# Patient Record
Sex: Female | Born: 1935
Health system: Southern US, Community
[De-identification: ages and names within clinical notes are randomized; demographics above are authoritative.]

## PROBLEM LIST (undated history)

## (undated) DIAGNOSIS — C50919 Malignant neoplasm of unspecified site of unspecified female breast: Secondary | ICD-10-CM

## (undated) DIAGNOSIS — I609 Nontraumatic subarachnoid hemorrhage, unspecified: Secondary | ICD-10-CM

## (undated) DIAGNOSIS — E785 Hyperlipidemia, unspecified: Secondary | ICD-10-CM

## (undated) DIAGNOSIS — R945 Abnormal results of liver function studies: Secondary | ICD-10-CM

## (undated) DIAGNOSIS — S52123A Displaced fracture of head of unspecified radius, initial encounter for closed fracture: Secondary | ICD-10-CM

## (undated) DIAGNOSIS — Z803 Family history of malignant neoplasm of breast: Secondary | ICD-10-CM

## (undated) DIAGNOSIS — K635 Polyp of colon: Secondary | ICD-10-CM

## (undated) DIAGNOSIS — H3530 Unspecified macular degeneration: Secondary | ICD-10-CM

## (undated) DIAGNOSIS — I10 Essential (primary) hypertension: Secondary | ICD-10-CM

## (undated) DIAGNOSIS — K589 Irritable bowel syndrome without diarrhea: Secondary | ICD-10-CM

## (undated) DIAGNOSIS — C44712 Basal cell carcinoma of skin of right lower limb, including hip: Secondary | ICD-10-CM

## (undated) DIAGNOSIS — K5790 Diverticulosis of intestine, part unspecified, without perforation or abscess without bleeding: Secondary | ICD-10-CM

## (undated) DIAGNOSIS — S065X9A Traumatic subdural hemorrhage with loss of consciousness of unspecified duration, initial encounter: Secondary | ICD-10-CM

## (undated) DIAGNOSIS — E0590 Thyrotoxicosis, unspecified without thyrotoxic crisis or storm: Secondary | ICD-10-CM

## (undated) DIAGNOSIS — T7840XA Allergy, unspecified, initial encounter: Secondary | ICD-10-CM

## (undated) DIAGNOSIS — C4491 Basal cell carcinoma of skin, unspecified: Secondary | ICD-10-CM

## (undated) DIAGNOSIS — Z1379 Encounter for other screening for genetic and chromosomal anomalies: Secondary | ICD-10-CM

## (undated) HISTORY — PX: ABDOMINAL HYSTERECTOMY: SHX81

## (undated) HISTORY — DX: Diverticulosis of intestine, part unspecified, without perforation or abscess without bleeding: K57.90

## (undated) HISTORY — DX: Irritable bowel syndrome without diarrhea: K58.9

## (undated) HISTORY — DX: Essential (primary) hypertension: I10

## (undated) HISTORY — DX: Encounter for other screening for genetic and chromosomal anomalies: Z13.79

## (undated) HISTORY — DX: Basal cell carcinoma of skin, unspecified: C44.91

## (undated) HISTORY — DX: Unspecified macular degeneration: H35.30

## (undated) HISTORY — PX: TUBAL LIGATION: SHX77

## (undated) HISTORY — DX: Family history of malignant neoplasm of breast: Z80.3

## (undated) HISTORY — PX: CATARACT EXTRACTION, BILATERAL: SHX1313

## (undated) HISTORY — DX: Allergy, unspecified, initial encounter: T78.40XA

## (undated) HISTORY — DX: Gilbert syndrome: E80.4

## (undated) HISTORY — DX: Traumatic subdural hemorrhage with loss of consciousness of unspecified duration, initial encounter: S06.5X9A

## (undated) HISTORY — DX: Nontraumatic subarachnoid hemorrhage, unspecified: I60.9

## (undated) HISTORY — DX: Displaced fracture of head of unspecified radius, initial encounter for closed fracture: S52.123A

## (undated) HISTORY — DX: Polyp of colon: K63.5

## (undated) HISTORY — DX: Abnormal results of liver function studies: R94.5

## (undated) HISTORY — DX: Hyperlipidemia, unspecified: E78.5

## (undated) HISTORY — DX: Basal cell carcinoma of skin of right lower limb, including hip: C44.712

## (undated) HISTORY — DX: Malignant neoplasm of unspecified site of unspecified female breast: C50.919

## (undated) HISTORY — DX: Thyrotoxicosis, unspecified without thyrotoxic crisis or storm: E05.90

---

## 1982-03-26 DIAGNOSIS — K589 Irritable bowel syndrome without diarrhea: Secondary | ICD-10-CM

## 1982-03-26 HISTORY — DX: Irritable bowel syndrome, unspecified: K58.9

## 1987-03-27 DIAGNOSIS — C50919 Malignant neoplasm of unspecified site of unspecified female breast: Secondary | ICD-10-CM

## 1987-03-27 HISTORY — DX: Malignant neoplasm of unspecified site of unspecified female breast: C50.919

## 1987-03-27 HISTORY — PX: BREAST LUMPECTOMY: SHX2

## 1998-03-26 HISTORY — PX: TOTAL KNEE ARTHROPLASTY: SHX125

## 1999-09-22 ENCOUNTER — Ambulatory Visit (HOSPITAL_COMMUNITY): Admission: RE | Admit: 1999-09-22 | Discharge: 1999-09-22 | Payer: Self-pay | Admitting: Internal Medicine

## 1999-09-22 ENCOUNTER — Encounter: Payer: Self-pay | Admitting: Internal Medicine

## 1999-10-03 ENCOUNTER — Other Ambulatory Visit: Admission: RE | Admit: 1999-10-03 | Discharge: 1999-10-03 | Payer: Self-pay | Admitting: Obstetrics and Gynecology

## 1999-10-11 ENCOUNTER — Encounter: Admission: RE | Admit: 1999-10-11 | Discharge: 2000-01-09 | Payer: Self-pay | Admitting: Radiation Oncology

## 2000-09-23 ENCOUNTER — Encounter: Payer: Self-pay | Admitting: Internal Medicine

## 2000-09-23 ENCOUNTER — Ambulatory Visit (HOSPITAL_COMMUNITY): Admission: RE | Admit: 2000-09-23 | Discharge: 2000-09-23 | Payer: Self-pay | Admitting: Internal Medicine

## 2000-09-23 DIAGNOSIS — K635 Polyp of colon: Secondary | ICD-10-CM

## 2000-09-23 HISTORY — DX: Polyp of colon: K63.5

## 2000-10-09 ENCOUNTER — Ambulatory Visit (HOSPITAL_COMMUNITY): Admission: RE | Admit: 2000-10-09 | Discharge: 2000-10-09 | Payer: Self-pay | Admitting: Gastroenterology

## 2000-10-09 ENCOUNTER — Encounter (INDEPENDENT_AMBULATORY_CARE_PROVIDER_SITE_OTHER): Payer: Self-pay | Admitting: Specialist

## 2000-11-14 ENCOUNTER — Ambulatory Visit (HOSPITAL_COMMUNITY): Admission: RE | Admit: 2000-11-14 | Discharge: 2000-11-14 | Payer: Self-pay | Admitting: Obstetrics and Gynecology

## 2000-11-14 ENCOUNTER — Encounter: Payer: Self-pay | Admitting: Obstetrics and Gynecology

## 2001-09-24 ENCOUNTER — Encounter: Payer: Self-pay | Admitting: Obstetrics and Gynecology

## 2001-09-24 ENCOUNTER — Ambulatory Visit (HOSPITAL_COMMUNITY): Admission: RE | Admit: 2001-09-24 | Discharge: 2001-09-24 | Payer: Self-pay | Admitting: Obstetrics and Gynecology

## 2001-10-07 ENCOUNTER — Encounter: Admission: RE | Admit: 2001-10-07 | Discharge: 2001-10-07 | Payer: Self-pay | Admitting: Internal Medicine

## 2001-10-07 ENCOUNTER — Encounter: Payer: Self-pay | Admitting: Internal Medicine

## 2001-11-05 ENCOUNTER — Other Ambulatory Visit: Admission: RE | Admit: 2001-11-05 | Discharge: 2001-11-05 | Payer: Self-pay | Admitting: Obstetrics and Gynecology

## 2001-11-07 ENCOUNTER — Ambulatory Visit (HOSPITAL_COMMUNITY): Admission: RE | Admit: 2001-11-07 | Discharge: 2001-11-07 | Payer: Self-pay | Admitting: Internal Medicine

## 2002-03-26 HISTORY — PX: TOTAL KNEE ARTHROPLASTY: SHX125

## 2002-05-18 ENCOUNTER — Inpatient Hospital Stay (HOSPITAL_COMMUNITY): Admission: RE | Admit: 2002-05-18 | Discharge: 2002-05-21 | Payer: Self-pay | Admitting: Orthopedic Surgery

## 2002-10-02 ENCOUNTER — Ambulatory Visit (HOSPITAL_COMMUNITY): Admission: RE | Admit: 2002-10-02 | Discharge: 2002-10-02 | Payer: Self-pay | Admitting: Obstetrics and Gynecology

## 2002-10-02 ENCOUNTER — Encounter: Payer: Self-pay | Admitting: Obstetrics and Gynecology

## 2003-10-11 ENCOUNTER — Ambulatory Visit (HOSPITAL_COMMUNITY): Admission: RE | Admit: 2003-10-11 | Discharge: 2003-10-11 | Payer: Self-pay | Admitting: Internal Medicine

## 2004-01-18 ENCOUNTER — Other Ambulatory Visit: Admission: RE | Admit: 2004-01-18 | Discharge: 2004-01-18 | Payer: Self-pay | Admitting: Obstetrics and Gynecology

## 2004-05-24 HISTORY — PX: HAMMER TOE SURGERY: SHX385

## 2004-10-11 ENCOUNTER — Ambulatory Visit (HOSPITAL_COMMUNITY): Admission: RE | Admit: 2004-10-11 | Discharge: 2004-10-11 | Payer: Self-pay | Admitting: Obstetrics and Gynecology

## 2005-10-12 ENCOUNTER — Ambulatory Visit (HOSPITAL_COMMUNITY): Admission: RE | Admit: 2005-10-12 | Discharge: 2005-10-12 | Payer: Self-pay | Admitting: Internal Medicine

## 2006-03-26 DIAGNOSIS — H3530 Unspecified macular degeneration: Secondary | ICD-10-CM

## 2006-03-26 HISTORY — DX: Unspecified macular degeneration: H35.30

## 2006-11-20 ENCOUNTER — Ambulatory Visit (HOSPITAL_COMMUNITY): Admission: RE | Admit: 2006-11-20 | Discharge: 2006-11-20 | Payer: Self-pay | Admitting: *Deleted

## 2007-01-25 HISTORY — PX: OTHER SURGICAL HISTORY: SHX169

## 2007-11-24 ENCOUNTER — Ambulatory Visit (HOSPITAL_COMMUNITY): Admission: RE | Admit: 2007-11-24 | Discharge: 2007-11-24 | Payer: Self-pay | Admitting: Obstetrics and Gynecology

## 2008-01-20 LAB — HM DEXA SCAN

## 2008-06-24 DIAGNOSIS — S065XAA Traumatic subdural hemorrhage with loss of consciousness status unknown, initial encounter: Secondary | ICD-10-CM

## 2008-06-24 DIAGNOSIS — S065X9A Traumatic subdural hemorrhage with loss of consciousness of unspecified duration, initial encounter: Secondary | ICD-10-CM

## 2008-06-24 HISTORY — DX: Traumatic subdural hemorrhage with loss of consciousness of unspecified duration, initial encounter: S06.5X9A

## 2008-06-24 HISTORY — DX: Traumatic subdural hemorrhage with loss of consciousness status unknown, initial encounter: S06.5XAA

## 2008-06-28 ENCOUNTER — Inpatient Hospital Stay (HOSPITAL_COMMUNITY): Admission: EM | Admit: 2008-06-28 | Discharge: 2008-06-30 | Payer: Self-pay | Admitting: Emergency Medicine

## 2008-07-05 ENCOUNTER — Encounter: Admission: RE | Admit: 2008-07-05 | Discharge: 2008-07-05 | Payer: Self-pay | Admitting: Neurosurgery

## 2008-07-12 ENCOUNTER — Encounter: Admission: RE | Admit: 2008-07-12 | Discharge: 2008-07-12 | Payer: Self-pay | Admitting: Neurosurgery

## 2008-07-29 ENCOUNTER — Encounter: Admission: RE | Admit: 2008-07-29 | Discharge: 2008-07-29 | Payer: Self-pay | Admitting: Neurosurgery

## 2008-08-26 ENCOUNTER — Encounter: Admission: RE | Admit: 2008-08-26 | Discharge: 2008-08-26 | Payer: Self-pay | Admitting: Neurosurgery

## 2008-11-24 ENCOUNTER — Ambulatory Visit (HOSPITAL_COMMUNITY): Admission: RE | Admit: 2008-11-24 | Discharge: 2008-11-24 | Payer: Self-pay | Admitting: Obstetrics and Gynecology

## 2008-11-30 ENCOUNTER — Encounter: Admission: RE | Admit: 2008-11-30 | Discharge: 2008-11-30 | Payer: Self-pay | Admitting: Neurosurgery

## 2009-06-24 DIAGNOSIS — R7989 Other specified abnormal findings of blood chemistry: Secondary | ICD-10-CM

## 2009-06-24 HISTORY — DX: Other specified abnormal findings of blood chemistry: R79.89

## 2009-07-14 ENCOUNTER — Encounter: Admission: RE | Admit: 2009-07-14 | Discharge: 2009-07-14 | Payer: Self-pay | Admitting: Family Medicine

## 2009-07-24 HISTORY — PX: LAPAROSCOPIC CHOLECYSTECTOMY: SUR755

## 2009-07-24 HISTORY — PX: ERCP: SHX60

## 2009-07-28 ENCOUNTER — Encounter: Admission: RE | Admit: 2009-07-28 | Discharge: 2009-07-28 | Payer: Self-pay | Admitting: General Surgery

## 2009-08-03 ENCOUNTER — Inpatient Hospital Stay (HOSPITAL_COMMUNITY): Admission: AD | Admit: 2009-08-03 | Discharge: 2009-08-04 | Payer: Self-pay | Admitting: Gastroenterology

## 2009-08-03 ENCOUNTER — Encounter (INDEPENDENT_AMBULATORY_CARE_PROVIDER_SITE_OTHER): Payer: Self-pay | Admitting: Gastroenterology

## 2009-11-25 ENCOUNTER — Ambulatory Visit (HOSPITAL_COMMUNITY): Admission: RE | Admit: 2009-11-25 | Discharge: 2009-11-25 | Payer: Self-pay | Admitting: Family Medicine

## 2010-02-21 LAB — HM PAP SMEAR: HM Pap smear: NORMAL

## 2010-04-11 ENCOUNTER — Ambulatory Visit: Payer: Self-pay | Admitting: Genetic Counselor

## 2010-04-16 ENCOUNTER — Encounter: Payer: Self-pay | Admitting: Internal Medicine

## 2010-04-16 ENCOUNTER — Encounter: Payer: Self-pay | Admitting: Obstetrics and Gynecology

## 2010-06-13 LAB — COMPREHENSIVE METABOLIC PANEL
ALT: 119 U/L — ABNORMAL HIGH (ref 0–35)
AST: 152 U/L — ABNORMAL HIGH (ref 0–37)
AST: 89 U/L — ABNORMAL HIGH (ref 0–37)
Albumin: 2.8 g/dL — ABNORMAL LOW (ref 3.5–5.2)
Albumin: 3.1 g/dL — ABNORMAL LOW (ref 3.5–5.2)
Albumin: 3.7 g/dL (ref 3.5–5.2)
Alkaline Phosphatase: 255 U/L — ABNORMAL HIGH (ref 39–117)
BUN: 8 mg/dL (ref 6–23)
CO2: 28 mEq/L (ref 19–32)
CO2: 29 mEq/L (ref 19–32)
Calcium: 8.6 mg/dL (ref 8.4–10.5)
Calcium: 9.1 mg/dL (ref 8.4–10.5)
Calcium: 9.9 mg/dL (ref 8.4–10.5)
Creatinine, Ser: 0.78 mg/dL (ref 0.4–1.2)
GFR calc Af Amer: >60 mL/min (ref >60)
GFR calc non Af Amer: 60 mL/min — ABNORMAL LOW (ref >60)
GFR calc non Af Amer: >60 mL/min (ref >60)
Potassium: 3.1 mEq/L — ABNORMAL LOW (ref 3.5–5.1)
Potassium: 3.9 mEq/L (ref 3.5–5.1)
Sodium: 141 mEq/L (ref 135–145)
Sodium: 143 mEq/L (ref 135–145)
Total Bilirubin: 2.1 mg/dL — ABNORMAL HIGH (ref 0.3–1.2)
Total Protein: 4.9 g/dL — ABNORMAL LOW (ref 6.0–8.3)
Total Protein: 6.4 g/dL (ref 6.0–8.3)

## 2010-06-13 LAB — CBC
HCT: 35.1 % — ABNORMAL LOW (ref 36.0–46.0)
HCT: 40.8 % (ref 36.0–46.0)
MCHC: 33.4 g/dL (ref 30.0–36.0)
MCHC: 33.4 g/dL (ref 30.0–36.0)
MCHC: 33.5 g/dL (ref 30.0–36.0)
MCV: 89 fL (ref 78.0–100.0)
Platelets: 135 10*3/uL — ABNORMAL LOW (ref 150–400)
Platelets: 148 10*3/uL — ABNORMAL LOW (ref 150–400)
RBC: 4.21 MIL/uL (ref 3.87–5.11)
RDW: 13.3 % (ref 11.5–15.5)
RDW: 13.6 % (ref 11.5–15.5)
WBC: 5.1 10*3/uL (ref 4.0–10.5)
WBC: 5.6 10*3/uL (ref 4.0–10.5)

## 2010-06-13 LAB — TYPE AND SCREEN

## 2010-06-13 LAB — DIFFERENTIAL
Basophils Absolute: 0 10*3/uL (ref 0.0–0.1)
Basophils Relative: 0 % (ref 0–1)
Eosinophils Absolute: 0.1 10*3/uL (ref 0.0–0.7)
Monocytes Relative: 17 % — ABNORMAL HIGH (ref 3–12)
Neutrophils Relative %: 65 % (ref 43–77)

## 2010-07-05 LAB — COMPREHENSIVE METABOLIC PANEL
ALT: 21 U/L (ref 0–35)
Albumin: 4.2 g/dL (ref 3.5–5.2)
Alkaline Phosphatase: 79 U/L (ref 39–117)
BUN: 17 mg/dL (ref 6–23)
Chloride: 103 mEq/L (ref 96–112)
Glucose, Bld: 111 mg/dL — ABNORMAL HIGH (ref 70–99)
Potassium: 3 mEq/L — ABNORMAL LOW (ref 3.5–5.1)
Sodium: 142 mEq/L (ref 135–145)
Total Bilirubin: 1.8 mg/dL — ABNORMAL HIGH (ref 0.3–1.2)
Total Protein: 6.3 g/dL (ref 6.0–8.3)

## 2010-07-05 LAB — POCT I-STAT, CHEM 8
BUN: 19 mg/dL (ref 6–23)
Creatinine, Ser: 1 mg/dL (ref 0.4–1.2)
Glucose, Bld: 105 mg/dL — ABNORMAL HIGH (ref 70–99)
Hemoglobin: 15.3 g/dL — ABNORMAL HIGH (ref 12.0–15.0)
Potassium: 3 mEq/L — ABNORMAL LOW (ref 3.5–5.1)
Sodium: 142 mEq/L (ref 135–145)

## 2010-07-05 LAB — URINE MICROSCOPIC-ADD ON

## 2010-07-05 LAB — URINALYSIS, ROUTINE W REFLEX MICROSCOPIC
Glucose, UA: NEGATIVE mg/dL
Hgb urine dipstick: NEGATIVE
Specific Gravity, Urine: 1.01 (ref 1.005–1.030)

## 2010-07-05 LAB — URINE CULTURE

## 2010-07-05 LAB — PROTIME-INR: INR: 1 (ref 0.00–1.49)

## 2010-08-07 ENCOUNTER — Other Ambulatory Visit: Payer: Self-pay | Admitting: Family Medicine

## 2010-08-07 NOTE — Telephone Encounter (Signed)
Last seen by you at [facility], 11/25/09

## 2010-08-08 NOTE — H&P (Signed)
NAMEMarland Kitchen  Courtney Castro, Courtney Castro             ACCOUNT NO.:  [id number]   MEDICAL RECORD NO.:  [id number]          PATIENT TYPE:  INP   LOCATION:  3039                         FACILITY:  [facility]   PHYSICIAN:  Donalee Citrin, M.D.        DATE OF BIRTH:  [birth year]   DATE OF ADMISSION:  06/28/2008  DATE OF DISCHARGE:                              HISTORY & PHYSICAL   ADMITTING DIAGNOSIS:  Subacute chronic subdural hematoma.   HISTORY OF PRESENT ILLNESS:  The patient is a 77 year old female who had  a fall on the ice pack in the end of February 2010 landing on the left  side of her face, left eye, left orbit, and mouth sustaining a  laceration and saw her regular medical doctor, who was referred her to  the Urgent Care, and lacerations were sewn up.  He was doing fairly  well.  However, he has had approximately 3 episodes of slurring of her  speech, difficulty with word finding as well as some blurriness of  vision that will only last a couple of minutes at a time, most recent  event like this was this morning.  In between times, the patient is  awake and alert and completely oriented with no neurological problems.  The patient has been followed by nap and he is scheduled MRI scan on  Thursday.  However, the patient, with this most recent episode, was  brought to the ER and in the ER obtained a CT scan that revealed the  aforementioned hemorrhage.   PAST MEDICAL HISTORY:  Remarkable only for hypertension,  hypercholesterolemia, and hypothyroidism.   PAST SURGICAL HISTORY:  She has had tubal ligation, bilateral knee  replacements, and hysterectomy.   SOCIAL HISTORY:  She is a nonsmoker.   ALLERGIES:  She has an allergy to some pain medication, unspecified.   CURRENT MEDICATIONS:  1. Hyzaar 100/25 once a day.  2. Atenolol 25 once a day.  3. Simvastatin 40 once a day.  4. Synthroid 0.025 once a day and there is other p.r.n. including      aspirin every other day.   PHYSICAL EXAMINATION:  GENERAL:   On exam, the patient is a very pleasant  awake and alert 77 year old female in no acute distress.  HEENT is within normal limits.  Pupils are equal, round, and reactive to  light.  Extraocular movements are intact.  NEUROLOGIC:  Cranial nerves are intact.  Strength is 5/5 in the upper  and lower extremities.  She has no evidence of  pronator drift.  She has  normal sensation, normal reflexes.   CT scan does show a subacute chronic subdural hematoma bilaterally  worsen on the left with 4 mm of shift.  This appears to be mostly the  actual subdural size which represents about 4-5 mm with layering over  the convexity extensively discussed the results of the CT scan with the  patient and her husband and what I have recommended is to bring the  patient in the hospital, keep her off her aspirin, follow serial CTs,  and hopefully see some progressive breakdown of the acute  to subacute  component of this in order to facilitate potential burr-hole treatment  for this  subdural, and the patient is currently neurologically nonfocal and with  serial neurological exams and follow up CTs.  We will probably schedule  burr holes for Wednesday.  We will notify her primary care about her  admissions in the morning and we will admit the patient to observation  unit.           ______________________________  Donalee Citrin, M.D.     GC/MEDQ  D:  06/28/2008  T:  06/29/2008  Job:  [id number]

## 2010-08-11 NOTE — Procedures (Signed)
[facility]  Patient:    Courtney Castro, Courtney Castro                      MRN: [id number] Proc. Date: 10/09/00 Attending:  Verlin Grills, M.D. CC:         Darius Bump, M.D.   Procedure Report  PROCEDURE:  Colonoscopy and polypectomy.  REFERRING PHYSICIAN:  Darius Bump, M.D.  INDICATION FOR PROCEDURE:  Ms. Courtney Castro (DOB: 1933-04-13) is a 77 year old female who is referred for surveillance colonoscopy and polypectomy to prevent colon cancer. I discussed with [name] the complications associated with colonoscopy and polypectomy including a 15/1000 risk of bleeding and 06/998 risk of colonic perforation requiring surgical repair. [name] has signed the operative permit.  ENDOSCOPIST:  Verlin Grills, M.D.  PREMEDICATION:  Versed 7.5 mg, Demerol 50 mg.  ENDOSCOPE:  Olympus pediatric colonoscope.  DESCRIPTION OF PROCEDURE:  After obtaining informed consent, the patient was placed in the left lateral decubitus position. I administered intravenous Demerol and intravenous Versed to achieve conscious sedation for the procedure. The patients cardiac rhythm, oxygen saturation and blood pressure were monitored throughout the procedure and documented in the medical record.  Anal inspection was normal. Digital rectal exam was normal. The Olympus pediatric video colonoscope was introduced into the rectum and advanced to the cecum. Colonic preparation for the exam today was excellent.  RECTUM:  Normal.  SIGMOID COLON/DESCENDING COLON:  A few small colonic diverticula are present.  SPLENIC FLEXURE:  Normal.  TRANSVERSE COLON:  Normal.  HEPATIC FLEXURE:  Normal.  ASCENDING COLON:  From the proximal ascending colon, a 1 mm sessile polyp was removed with the cold biopsy forceps and submitted for pathological interpretation.  CECUM/ILEOCECAL VALVE:  Normal.  ASSESSMENT:  1. A few small diverticula are noted  predominantly in the sigmoid colon.  2. From the proximal ascending colon, a 1 mm sessile polyp was removed with     the cold biopsy forceps.  RECOMMENDATIONS:  If the ascending colon polyp returns neoplastic pathologically, [name] should undergo a repeat colonoscopy in five years. If the ascending colon polyp is nonneoplastic pathologically, [name] should undergo a repeat surveillance colonoscopy in approximately 10 years. DD:  10/09/00 TD:  10/09/00 Job: [id number]

## 2010-08-11 NOTE — Op Note (Signed)
NAME:  Courtney Castro, LOZADA                       ACCOUNT NO.:  [id number]   MEDICAL RECORD NO.:  [id number]                   PATIENT TYPE:  INP   LOCATION:  0008                                 [facility]AC[facility]  [facility]   PHYSICIAN:  Ollen Gross, M.D.                 DATE OF BIRTH:  08/25/1933   DATE OF PROCEDURE:  05/18/2002  DATE OF DISCHARGE:                                 OPERATIVE REPORT   PREOPERATIVE DIAGNOSIS:  Osteoarthritis of left knee.   POSTOPERATIVE DIAGNOSIS:  Osteoarthritis of left knee.   PROCEDURE:  Left total knee arthroplasty.   SURGEON:  Gus Rankin. Aluisio, M.D.   ASSISTANT:  Alexzandrew L. Julien Girt, P.A.   ANESTHESIA:  Spinal.   ESTIMATED BLOOD LOSS:  Minimal.   DRAIN:  Hemovac x 1.   COMPLICATIONS:  None.   TOURNIQUET TIME:  49 minutes at 300 mmHg.   CONDITION:  Stable to recovery.   BRIEF CLINICAL NOTE:  Ms. Courtney Castro is a 77 year old female with severe end-  stage arthritis of the left knee with pain refractory to nonoperative  management.  She has had a previous successful right total knee done in  [place] and presents now for left total knee arthroplasty.   PROCEDURE IN DETAIL:  After the successful administration of spinal  anesthetic, a tourniquet is placed high on the left thigh and left lower  extremity prepped and draped in the usual sterile fashion.  Extremity is  wrapped in Esmarch, knee flexed, tourniquet inflated to 300 mmHg.  Standard  midline incision is made with a 10 blade through subcutaneous tissue to the  level of the extensor mechanism.  A fresh blade is used to make a medial  parapatellar arthrotomy.  Then, the soft tissue over the proximal and medial  tibia is subperiosteally elevated to the joint line with a knife into  semimembranous bursa with a curved osteotome.  Soft tissue over the proximal  and lateral tibia is also elevated with attention being paid to avoiding the  patellar tendon on tibial tubercle.  The patella is then  everted, knee  flexed to 90 degrees and ACL and PCL are removed.  A drill is used to create  a starting hole in the distal femur, canal is irrigated, and a five-degree  left valgus alignment guide is placed.  Referencing off the posterior  condyles, rotation is marked and a block pinned to remove 10 mm off the  distal femur.  Distal femoral resection is then made with an oscillating  saw.   The sizing block is placed and size 3 is the most appropriate femur.  The  rotation is marked at the epicondylar axis and then the AP block placed.  The anterior and posterior cuts are then made.   Tibia is subluxed forward and the menisci removed.  The extramedullary  tibial alignment guide is placed referencing proximally at the medial aspect  of the tibial  tubercle and distally along the second metatarsal axis and  tibial crest.  The block is pinned to remove 10 mm off the nondeficient  lateral side.  Tibial resection is made with an oscillating saw.  The size 3  is the most appropriate tibial component, and then the proximal tibia is  prepared with the modular drill and keel punch.  The femoral preparation is  completed with the intercondylar and chamfer cuts.   A size 3 posterior stabilized femoral trial, size 3 mobile bearing tibial  trial, the 10 mm posterior stabilized rotating platform insert trial are  placed.  With the 10, there was a tiny bit of varus and valgus play in full  extension, thus we went with a 12.5, and she still achieved full extension  with excellent balance throughout her full range of motion.  The patella is  then everted, thickness measured to be 21 mm, free-hand resection taken to  12 mm.  The 38 template placed, lug holes drilled, trial patella placed, and  it tracks normally.  The osteophytes are then removed off the posterior  femur with the trial in place.  All trials are then removed, and the cut  bone surfaces are prepared with pulsatile lavage.  The cement  is  mixed and  once ready for implantation, a size 3 mobile bearing tibial tray, size 3  posterior stabilized femur, and 38 patella are cemented into place.  Trial  12.5 mm insert is placed, knee held in full extension, all extruded cement  removed.  The permanent 12.5 rotating platform posterior stabilized  component is then placed.  The knee is reduced with excellent stability  throughout full range of motion.  The wound is copiously irrigated with  antibiotic solution and the extensor mechanism closed over a Hemovac drain  with interrupted #1 PDS.  Tourniquet is released for a total time of 49  minutes.  Flexion against gravity is 135 degrees.  Subcu is closed with  interrupted 2-0 Vicryl, subcuticular closed with running 4-0 Monocryl.  The  incision is clean and dry and Steri-Strips and a bulky sterile dressing  applied.  The patient is awakened and transported to recovery in stable  condition.                                               Ollen Gross, M.D.    FA/MEDQ  D:  05/18/2002  T:  05/18/2002  Job:  [id number]

## 2010-08-11 NOTE — Discharge Summary (Signed)
NAME:  Courtney Castro, Courtney Castro                       ACCOUNT NO.:  [id number]   MEDICAL RECORD NO.:  [id number]                   PATIENT TYPE:  INP   LOCATION:  0453                                 [facility]ACILITY:  [facility]   PHYSICIAN:  Ollen Gross, M.D.                 DATE OF BIRTH:  1933/07/20   DATE OF ADMISSION:  05/18/2002  DATE OF DISCHARGE:  05/21/2002                                 DISCHARGE SUMMARY   ADMITTING DIAGNOSES:  1. Osteoarthritis, left knee.  2. Hypertension.  3. Hypercholesterolemia.  4. Hypothyroidism.  5. History of spastic colon.  6. History of breast cancer.   DISCHARGE DIAGNOSES:  1. Osteoarthritis, left knee, status post left total knee replacement     arthroplasty.  2. Mild postoperative blood loss anemia, did not require transfusion.  3. Hypokalemia, improved.  4. Postoperative hyponatremia, improved.  5. Hypertension.  6. Hypercholesterolemia.  7. Hypothyroidism.  8. History of spastic colon.  9. History of breast cancer.   PROCEDURE:  The patient was taken to the OR on 05/18/02, underwent a left  total knee arthroplasty.  Surgeon:  Ollen Gross, MD.  Assistant was  Alexzandrew L. Perkins, P.A.-C.  Surgery was done under spinal anesthesia.  Minimal blood loss.  Hemovac drain x 1.  Tourniquet time 49 minutes at 300  mmHg.   CONSULTS:  None.   BRIEF HISTORY:  The patient is a 77 year old female seen by Ollen Gross,  M.D. for ongoing left knee pain.  Has a known history of arthritis.  She  previously had a right total knee arthroplasty back in [place],  approximately four years ago and has had fantastic results.  She is seen in  the office for left knee pain.  X-rays show severe medial compartment  patellofemoral arthritis with sparing of the lateral compartment.  X-rays  show a Sigma total knee in excellent position on the right.  She has reached  a point where she would like to have something done about the left knee  pain.  It is starting to  interfere with her daily activities.  It is felt  she would benefit from undergoing a summary replacement.  Risks and benefits  of the procedure have been discussed with the patient, and she has elected  to proceed with surgery.   LABORATORY DATA:  CBC on admission showed a hemoglobin of 14.7, hematocrit  42.9, white cell count 4.7, red cell count 5.0.  The admission CBC on the  differential showed neutrophils 63, lymphs 21, elevated monos at 14, eos 2,  basos 0.  Serial H&H's are followed.  Postop H&H 12.1 and 35.3.  Last noted  H&H 10.6 and 30.4.  PT/PTT on admission was 12.3 and 26, respectively with  an INR of 0.9.  Serial pro times followed.  Last noted PT/INR 20.5 and 2.1.  Chem panel on admission showed low potassium of 2.8.  Patient was placed on  potassium supplements, came up, a glucose elevated at 132.  Serial BMETs are  followed.  Glucose continued to increase up to 160 and was back down to  normal limits at 112.  Calcium dropped from 9.6 to 8.2 went back up to 8.4.  Potassium increased up to 4.2, went back down to 3.6.  Sodium dropped from  138 to 133, back up to 138.  Urinalysis on admission showed trace ketones  and small glucose, otherwise negative.  Blood group type O positive.   I do not see an EKG or chest x-ray report on this chart.   HOSPITAL COURSE:  The patient was admitted to [facility] and taken  to the OR, underwent the above-stated procedure without complications.  The  patient tolerated the procedure well and later returned to the recovery room  and to the orthopedic floor for continued postoperative care.  The patient  was given 24 hours of postop IV antibiotics, given Coumadin for DVT  prophylaxis, placed on PCA analgesics for pain control.  She was noted to  have positive fluid balance postop.  She underwent some mild diuresis, and  her I's&O's improved. PCA and IV was discontinued on postop day two.  Hemovac had been pulled on postop day one.   Dressing change was initiated on  postop day two.  The incision was healing well.  PT and OTx consulted postop  to assist with gait training ambulation and ADLs.  The patient did very well  with physical therapy after ambulating approximately 60 feet by postop day  two and then over 90 feet by that evening.  Again, 50 feet by postop day  three.  By postop day three, she was doing quite well, had been weaned over  to p.o. medications, ambulating well with minimal assist, and was felt to be  safe to be discharged home.   DISCHARGE PLAN:  The patient discharged home on 05/21/02.   DISCHARGE DIAGNOSES:  Please see above.   DISCHARGE MEDICATIONS:  1. Percocet for pain.  2. Robaxin for spasm.  3. Coumadin for DVT prophylaxis.   DIET:  Low cholesterol, low sodium.   ACTIVITY:  1. Home health PT and home health nursing through [facility].  2. Weightbearing as tolerated.  3. Total knee protocol.   FOLLOW UP:  Two weeks from surgery.   DISPOSITION:  Home.   CONDITION ON DISCHARGE:  Improved.     Alexzandrew L. Julien Girt, P.A.              Ollen Gross, M.D.    ALP/MEDQ  D:  06/08/2002  T:  06/08/2002  Job:  [id number]

## 2010-11-02 ENCOUNTER — Other Ambulatory Visit (HOSPITAL_COMMUNITY): Payer: Self-pay | Admitting: Obstetrics and Gynecology

## 2010-11-02 DIAGNOSIS — Z1231 Encounter for screening mammogram for malignant neoplasm of breast: Secondary | ICD-10-CM

## 2010-11-29 ENCOUNTER — Ambulatory Visit (HOSPITAL_COMMUNITY)
Admission: RE | Admit: 2010-11-29 | Discharge: 2010-11-29 | Disposition: A | Discharge status: Home / Self Care | Payer: Medicare Other | Source: Ambulatory Visit | Attending: Obstetrics and Gynecology | Admitting: Obstetrics and Gynecology

## 2010-11-29 DIAGNOSIS — Z1231 Encounter for screening mammogram for malignant neoplasm of breast: Secondary | ICD-10-CM

## 2010-12-05 ENCOUNTER — Other Ambulatory Visit: Payer: Self-pay | Admitting: Obstetrics and Gynecology

## 2010-12-05 DIAGNOSIS — R928 Other abnormal and inconclusive findings on diagnostic imaging of breast: Secondary | ICD-10-CM

## 2010-12-12 ENCOUNTER — Inpatient Hospital Stay: Admission: RE | Admit: 2010-12-12 | Payer: Medicare Other | Source: Ambulatory Visit

## 2010-12-21 ENCOUNTER — Ambulatory Visit
Admission: RE | Admit: 2010-12-21 | Discharge: 2010-12-21 | Disposition: A | Discharge status: Home / Self Care | Payer: Medicare Other | Source: Ambulatory Visit | Attending: Obstetrics and Gynecology | Admitting: Obstetrics and Gynecology

## 2010-12-21 ENCOUNTER — Other Ambulatory Visit: Payer: Self-pay | Admitting: Obstetrics and Gynecology

## 2010-12-21 DIAGNOSIS — R928 Other abnormal and inconclusive findings on diagnostic imaging of breast: Secondary | ICD-10-CM

## 2011-01-01 ENCOUNTER — Encounter: Payer: Medicare Other | Admitting: Family Medicine

## 2011-01-31 ENCOUNTER — Encounter: Payer: Self-pay | Admitting: Family Medicine

## 2011-01-31 ENCOUNTER — Encounter: Payer: Self-pay | Admitting: *Deleted

## 2011-01-31 ENCOUNTER — Ambulatory Visit (INDEPENDENT_AMBULATORY_CARE_PROVIDER_SITE_OTHER): Payer: Medicare Other | Admitting: Family Medicine

## 2011-01-31 VITALS — BP 130/74 | HR 60 | Ht 65.0 in | Wt 149.0 lb

## 2011-01-31 DIAGNOSIS — I10 Essential (primary) hypertension: Secondary | ICD-10-CM | POA: Insufficient documentation

## 2011-01-31 DIAGNOSIS — Z0000 Encounter for general adult medical examination without abnormal findings: Secondary | ICD-10-CM

## 2011-01-31 DIAGNOSIS — J309 Allergic rhinitis, unspecified: Secondary | ICD-10-CM

## 2011-01-31 DIAGNOSIS — E039 Hypothyroidism, unspecified: Secondary | ICD-10-CM

## 2011-01-31 DIAGNOSIS — Z23 Encounter for immunization: Secondary | ICD-10-CM

## 2011-01-31 DIAGNOSIS — E782 Mixed hyperlipidemia: Secondary | ICD-10-CM

## 2011-01-31 LAB — COMPREHENSIVE METABOLIC PANEL
Albumin: 4.8 g/dL (ref 3.5–5.2)
Alkaline Phosphatase: 77 U/L (ref 39–117)
CO2: 28 mEq/L (ref 19–32)
Calcium: 10.1 mg/dL (ref 8.4–10.5)
Chloride: 102 mEq/L (ref 96–112)
Glucose, Bld: 90 mg/dL (ref 70–99)
Potassium: 3.8 mEq/L (ref 3.5–5.3)
Sodium: 140 mEq/L (ref 135–145)
Total Protein: 6.5 g/dL (ref 6.0–8.3)

## 2011-01-31 LAB — POCT URINALYSIS DIPSTICK
Bilirubin, UA: NEGATIVE
Blood, UA: NEGATIVE
Glucose, UA: NEGATIVE
Leukocytes, UA: NEGATIVE
Nitrite, UA: NEGATIVE

## 2011-01-31 LAB — LIPID PANEL
LDL Cholesterol: 84 mg/dL (ref 0–99)
Triglycerides: 147 mg/dL (ref <150)

## 2011-01-31 NOTE — Progress Notes (Signed)
Courtney Castro is a 77 y.o. female who presents for a complete physical.  She has the following concerns: Pain in hips/low back and shoulder.  LBP x 2-3 months.  Stiffness and discomfort when she gets up.  Aleve helps (takes intermittently, for about a week at a time) Her GYN no longer takes her insurance, so would like to have GYN exam done today  Immunization History  Administered Date(s) Administered  . Influenza Split 12/11/2009, 01/02/2011  . Pneumococcal Polysaccharide 11/07/2001  . Td 12/18/2004  . Zoster 08/25/2007   Last Pap smear: 2011 Last mammogram: 11/2010--had mammo, u/s and core biopsy (benign) Last colonoscopy: 2007 ([facility]) Last DEXA: 10/09 (normal; at [facility]) Dentist: three times yearly Ophtho: yearly Exercise: Used to go to [facility] 2-3 times/week, but hasn't gone since the summer  Past Medical History  Diagnosis Date  . Hypertension   . Hyperlipidemia   . Hyperthyroidism   . Allergy     seasonal allergies  . IBS (irritable bowel syndrome) 1984  . Colon polyp 09/2000  . Macular degeneration 2008  . Basal cell carcinoma 2007    face-Dr.Drew Yetta Barre  . Diverticulosis     seen on colonoscopy  . Breast cancer 1989    R breast-microinvasive papillary(lumpectomy and XRT)  . Subdural hematoma 06/2008    Dr.Cram  . Elevated liver function tests 06/2009    galllstones and dialted CBD--s/p ERCP and chole  . Sullivan Lone syndrome     Past Surgical History  Procedure Date  . Abdominal hysterectomy     bladder repair  . Breast lumpectomy 1989    right breast  . Total knee arthroplasty 2000    right  . Total knee arthroplasty 2004    left knee  . Hammer toe surgery 05/2004    right foot  . Fracture left foot 01/2007    5th metatarsal-treated with boot  . Tubal ligation   . Ercp 07/2009    sphincerotomy  . Laparoscopic cholecystectomy 07/2009    History   Social History  . Marital Status: Married    Spouse Name: N/A    Number of Children: 2  .  Years of Education: N/A   Occupational History  .     Social History Main Topics  . Smoking status: Former Smoker -- 0.1 packs/day for 3 years    Quit date: 03/27/1963  . Smokeless tobacco: Not on file  . Alcohol Use: Yes     1 glass of wine per week.  . Drug Use: No  . Sexually Active: Not on file   Other Topics Concern  . Not on file   Social History Narrative   Retired Theme park manager    Family History  Problem Relation Age of Onset  . Leukemia Mother   . Ovarian cancer Mother     ??? hysterectomy age 98's  . Cancer Mother     leukemia; possible ovarian cancer (hysterectomy in her 40's)  . Coronary artery disease Father   . Hypertension Father   . Heart disease Father     onset late 15's  . Coronary artery disease Brother 60    coronary artery bypass grafting, pacemaker  . Heart disease Brother     CABG in 60's, pacemaker  . Breast cancer Maternal Aunt   . Cancer Maternal Aunt     breast  . Diabetes Paternal Uncle   . Diabetes Paternal Grandmother   . Cancer Cousin     breast  Current outpatient prescriptions:atenolol (TENORMIN) 25 MG tablet, Take 25 mg by mouth daily.  , Disp: , Rfl: ;  Calcium Carbonate-Vitamin D (CALTRATE 600+D) 600-400 MG-UNIT per tablet, Take 1 tablet by mouth 2 (two) times daily.  , Disp: , Rfl: ;  Cholecalciferol (VITAMIN D) 2000 UNITS tablet, Take 2,000 Units by mouth daily.  , Disp: , Rfl: ;  fexofenadine (ALLEGRA) 180 MG tablet, Take 180 mg by mouth daily.  , Disp: , Rfl:  fluticasone (FLONASE) 50 MCG/ACT nasal spray, Place 2 sprays into the nose daily.  , Disp: , Rfl: ;  GLUCOSAMINE PO, Take 2,000 mg by mouth 2 (two) times daily.  , Disp: , Rfl: ;  losartan-hydrochlorothiazide (HYZAAR) 100-25 MG per tablet, Take 1 tablet by mouth daily.  , Disp: , Rfl: ;  Multiple Vitamins-Minerals (CENTRUM SILVER PO), Take 1 tablet by mouth daily.  , Disp: , Rfl:  Multiple Vitamins-Minerals (PRESERVISION/LUTEIN) CAPS, Take 1 capsule by mouth 2 (two)  times daily.  , Disp: , Rfl: ;  simvastatin (ZOCOR) 20 MG tablet, Take 20 mg by mouth at bedtime.  , Disp: , Rfl: ;  SYNTHROID 25 MCG tablet, Take 1 tablet by mouth Daily., Disp: , Rfl:   Allergies  Allergen Reactions  . Percocet (Oxycodone-Acetaminophen) Other (See Comments)    Upset stomach   ROS: The patient denies anorexia, fever, weight changes, headaches,  vision changes, decreased hearing, ear pain, sore throat, breast concerns, chest pain, palpitations, dizziness, syncope, dyspnea on exertion, cough, swelling, nausea, vomiting, diarrhea, constipation, abdominal pain, melena, hematochezia, indigestion/heartburn, hematuria, incontinence, dysuria, vaginal bleeding, discharge, odor or itch, genital lesions, joint pains (see HPI), numbness, tingling, weakness, tremor, suspicious skin lesions, depression, anxiety, abnormal bleeding/bruising, or enlarged lymph nodes.  PHYSICAL EXAM: BP 130/74  Pulse 60  Ht 5\' 5"  (1.651 m)  Wt 149 lb (67.586 kg)  BMI 24.79 kg/m2  General Appearance:    Alert, cooperative, no distress, appears stated age  Head:    Normocephalic, without obvious abnormality, atraumatic  Eyes:    PERRL, conjunctiva/corneas clear, EOM's intact, fundi    benign  Ears:    Normal TM's and external ear canals  Nose:   Nares normal, mucosa normal, no drainage or sinus   tenderness  Throat:   Lips, mucosa, and tongue normal; teeth and gums normal  Neck:   Supple, no lymphadenopathy;  thyroid:  no   enlargement/tenderness/nodules; no carotid   bruit or JVD  Back:    Spine nontender, no curvature, ROM normal, no CVA     Tenderness. Area of discomfort is bilateral SI joints.  Some decreased ROM of pyriformis  Lungs:     Clear to auscultation bilaterally without wheezes, rales or     ronchi; respirations unlabored  Chest Wall:    No tenderness or deformity   Heart:    Regular rate and rhythm, S1 and S2 normal, no murmur, rub   or gallop  Breast Exam:    No tenderness, masses, or  nipple discharge or inversion.      No axillary lymphadenopathy.  WHSS R breast at 9 o'clock.  Left breast larger than right.  Abdomen:     Soft, non-tender, nondistended, normoactive bowel sounds,    no masses, no hepatosplenomegaly  Genitalia:    Normal external genitalia without lesions.  BUS and vagina normal; Hard stool in rectum palpable posteriorly in vaginal vault. Adnexa not palpable. Uterus surgically absent. No masses or tenderness.  Pap not performed  Rectal:  Normal tone, no masses or tenderness; guaiac negative stool  Extremities:   No clubbing, cyanosis or edema  Pulses:   2+ and symmetric all extremities  Skin:   Skin color, texture, turgor normal, no rashes or lesions.  Many seborrheic keratosis throughout.  Lymph nodes:   Cervical, supraclavicular, and axillary nodes normal  Neurologic:   CNII-XII intact, normal strength, sensation and gait; reflexes 2+ and symmetric throughout          Psych:   Normal mood, affect, hygiene and grooming.     ASSESSMENT/PLAN:  1. Routine general medical examination at a health care facility  POCT Urinalysis Dipstick  2. Need for pneumococcal vaccination  Pneumococcal polysaccharide vaccine 23-valent greater than or equal to 2yo subcutaneous/IM  3. Need for Tdap vaccination  Tdap vaccine greater than or equal to 7yo IM  4. Essential hypertension, benign  Comprehensive metabolic panel  5. Mixed hyperlipidemia  Comprehensive metabolic panel, Lipid panel  6. Unspecified hypothyroidism    7. Allergic rhinitis, cause unspecified     HTN--well controlled. LBP--stretches shown.  Continue NSAID's as needed.  Also hamstring stretches recommended.  If ongoing pain, consider PT or chiropractic treatment  Discussed monthly self breast exams and yearly mammograms; at least 30 minutes of aerobic activity at least 5 days/week; proper sunscreen use reviewed; healthy diet, including goals of calcium and vitamin D intake and alcohol recommendations (less  than or equal to 1 drink/day) reviewed; regular seatbelt use; changing batteries in smoke detectors.  Immunization recommendations discussed--TdaP and pneumovax booster given. Flu shots annually.  Colonoscopy recommendations reviewed--given history of polyps, likely due now (q 5 years).  Patient will call GI office

## 2011-01-31 NOTE — Patient Instructions (Addendum)
HEALTH MAINTENANCE RECOMMENDATIONS:  It is recommended that you get at least 30 minutes of aerobic exercise at least 5 days/week (for weight loss, you may need as much as 60-90 minutes). This can be any activity that gets your heart rate up. This can be divided in 10-15 minute intervals if needed, but try and build up your endurance at least once a week.  Weight bearing exercise is also recommended twice weekly.  Eat a healthy diet with lots of vegetables, fruits and fiber.  "Colorful" foods have a lot of vitamins (ie green vegetables, tomatoes, red peppers, etc).  Limit sweet tea, regular sodas and alcoholic beverages, all of which has a lot of calories and sugar.  Up to 1 alcoholic drink daily may be beneficial for women (unless trying to lose weight, watch sugars).  Drink a lot of water.  Calcium recommendations are 1200-1500 mg daily (1500 mg for postmenopausal women or women without ovaries), and vitamin D 1000 IU daily.  This should be obtained from diet and/or supplements (vitamins), and calcium should not be taken all at once, but in divided doses.  Monthly self breast exams and yearly mammograms for women over the age of 91 is recommended.  Sunscreen of at least SPF 30 should be used on all sun-exposed parts of the skin when outside between the hours of 10 am and 4 pm (not just when at beach or pool, but even with exercise, golf, tennis, and yard work!)  Use a sunscreen that says "broad spectrum" so it covers both UVA and UVB rays, and make sure to reapply every 1-2 hours.  Remember to change the batteries in your smoke detectors when changing your clock times in the spring and fall.  Use your seat belt every time you are in a car, and please drive safely and not be distracted with cell phones and texting while driving.   Try the stretches shown, as well as hamstring stretches. You may continue to use Aleve as needed If ongoing pain, consider PT or chiropractic (I recommend Dr. Thereasa Distance at [facility] on [facility])

## 2011-02-01 ENCOUNTER — Encounter: Payer: Self-pay | Admitting: Family Medicine

## 2011-02-14 ENCOUNTER — Other Ambulatory Visit: Payer: Self-pay | Admitting: *Deleted

## 2011-02-14 DIAGNOSIS — E785 Hyperlipidemia, unspecified: Secondary | ICD-10-CM

## 2011-02-14 MED ORDER — SIMVASTATIN 20 MG PO TABS: 20.0000 mg | ORAL_TABLET | Freq: Every day | ORAL | Status: DC

## 2011-06-13 ENCOUNTER — Ambulatory Visit (INDEPENDENT_AMBULATORY_CARE_PROVIDER_SITE_OTHER): Payer: Medicare Other | Admitting: Family Medicine

## 2011-06-13 ENCOUNTER — Encounter: Payer: Self-pay | Admitting: Family Medicine

## 2011-06-13 VITALS — BP 138/80 | HR 68 | Temp 98.4°F | Ht 65.0 in | Wt 155.0 lb

## 2011-06-13 DIAGNOSIS — L0100 Impetigo, unspecified: Secondary | ICD-10-CM

## 2011-06-13 DIAGNOSIS — L259 Unspecified contact dermatitis, unspecified cause: Secondary | ICD-10-CM

## 2011-06-13 DIAGNOSIS — L309 Dermatitis, unspecified: Secondary | ICD-10-CM

## 2011-06-13 MED ORDER — MUPIROCIN 2 % EX OINT: TOPICAL_OINTMENT | Freq: Three times a day (TID) | CUTANEOUS | Status: AC

## 2011-06-13 NOTE — Progress Notes (Signed)
Chief complaint:  2-3 months has had an itchy feeling in her right ear, probably longer, feels as if it is pulsating since Monday am. Feels like she has a lot of "gunk" in there.  HPI:  Right ear feels plugged x 3 days.  Notices a change in her R ear with position changes of her head.  Has some slight discomfort, and a lot of itching.  Uses Q-tips once or twice a week.  +allergies, and has been taking a daily antihistamine for years, but stopped taking it earlier this week.   Past Medical History  Diagnosis Date  . Hypertension   . Hyperlipidemia   . Hyperthyroidism   . Allergy     seasonal allergies  . IBS (irritable bowel syndrome) 1984  . Colon polyp 09/2000  . Macular degeneration 2008  . Basal cell carcinoma 2007    face-Dr.Drew Yetta Barre  . Diverticulosis     seen on colonoscopy  . Breast cancer 1989    R breast-microinvasive papillary(lumpectomy and XRT)  . Subdural hematoma 06/2008    Dr.Cram  . Elevated liver function tests 06/2009    galllstones and dialted CBD--s/p ERCP and chole  . Sullivan Lone syndrome     Past Surgical History  Procedure Date  . Abdominal hysterectomy     bladder repair  . Breast lumpectomy 1989    right breast  . Total knee arthroplasty 2000    right  . Total knee arthroplasty 2004    left knee  . Hammer toe surgery 05/2004    right foot  . Fracture left foot 01/2007    5th metatarsal-treated with boot  . Tubal ligation   . Ercp 07/2009    sphincerotomy  . Laparoscopic cholecystectomy 07/2009    History   Social History  . Marital Status: Married    Spouse Name: N/A    Number of Children: 2  . Years of Education: N/A   Occupational History  .     Social History Main Topics  . Smoking status: Former Smoker -- 0.1 packs/day for 3 years    Quit date: 03/27/1963  . Smokeless tobacco: Not on file  . Alcohol Use: Yes     1 glass of wine per week.  . Drug Use: No  . Sexually Active: Not on file   Other Topics Concern  . Not on file    Social History Narrative   Retired Theme park manager    Family History  Problem Relation Age of Onset  . Leukemia Mother   . Ovarian cancer Mother     ??? hysterectomy age 5's  . Cancer Mother     leukemia; possible ovarian cancer (hysterectomy in her 82's)  . Coronary artery disease Father   . Hypertension Father   . Heart disease Father     onset late 24's  . Coronary artery disease Brother 60    coronary artery bypass grafting, pacemaker  . Heart disease Brother     CABG in 60's, pacemaker  . Breast cancer Maternal Aunt   . Cancer Maternal Aunt     breast  . Diabetes Paternal Uncle   . Diabetes Paternal Grandmother   . Cancer Cousin     breast    Current outpatient prescriptions:atenolol (TENORMIN) 25 MG tablet, Take 25 mg by mouth daily.  , Disp: , Rfl: ;  Calcium Carbonate-Vitamin D (CALTRATE 600+D) 600-400 MG-UNIT per tablet, Take 1 tablet by mouth 2 (two) times daily.  , Disp: , Rfl: ;  Cholecalciferol (VITAMIN D) 2000 UNITS tablet, Take 2,000 Units by mouth daily.  , Disp: , Rfl: ;  fexofenadine (ALLEGRA) 180 MG tablet, Take 180 mg by mouth daily.  , Disp: , Rfl:  fluticasone (FLONASE) 50 MCG/ACT nasal spray, Place 2 sprays into the nose daily.  , Disp: , Rfl: ;  GLUCOSAMINE PO, Take 2,000 mg by mouth 2 (two) times daily.  , Disp: , Rfl: ;  losartan-hydrochlorothiazide (HYZAAR) 100-25 MG per tablet, Take 1 tablet by mouth daily.  , Disp: , Rfl: ;  Multiple Vitamins-Minerals (CENTRUM SILVER PO), Take 1 tablet by mouth daily.  , Disp: , Rfl:  Multiple Vitamins-Minerals (PRESERVISION/LUTEIN) CAPS, Take 1 capsule by mouth 2 (two) times daily.  , Disp: , Rfl: ;  simvastatin (ZOCOR) 20 MG tablet, Take 1 tablet (20 mg total) by mouth at bedtime., Disp: 90 tablet, Rfl: 1;  SYNTHROID 25 MCG tablet, Take 1 tablet by mouth Daily., Disp: , Rfl: ;  mupirocin ointment (BACTROBAN) 2 %, Apply topically 3 (three) times daily. To affected area of right ear, for 7-10 days, Disp: 22 g, Rfl:  0  Allergies  Allergen Reactions  . Percocet (Oxycodone-Acetaminophen) Other (See Comments)    Upset stomach   ROS:  Denies fevers, runny nose, sneezing, sore throat, cough, shortness of breath, chest pain, or other concerns.  PHYSICAL EXAM:  BP 138/80  Pulse 68  Temp(Src) 98.4 F (36.9 C) (Oral)  Ht 5\' 5"  (1.651 m)  Wt 155 lb (70.308 kg)  BMI 25.79 kg/m2 Well developed, pleasant female in no distress HEENT:  PERRL, EOMI, conjunctiva clear.  L TM and EAC normal.  R TM and EAC normal.  There is yellow crusting and fissure and the superior portion of the external ear canal on the right.  Remainder of canal is normal.  Neck: no lymphadenopathy or mass   ASSESSMENT/PLAN: 1. Impetigo  mupirocin ointment (BACTROBAN) 2 %  2. Dermatitis     Impetigo--mild.  Possibly underlying eczema or other dermatitis  Mupirocin ointment, and then may use OTC hydrocortisone twice daily to the area for up to a week if not completely resolved after using the bactroban.  F/u prn not completely resolving

## 2011-06-13 NOTE — Patient Instructions (Signed)
Mupirocin ointment three times daily for 7-10 days , and then may use OTC hydrocortisone twice daily to the area for up to a week if not completely resolved after using the mupirocin ointment.

## 2011-07-20 ENCOUNTER — Telehealth: Payer: Self-pay | Admitting: Internal Medicine

## 2011-07-20 DIAGNOSIS — J309 Allergic rhinitis, unspecified: Secondary | ICD-10-CM

## 2011-07-20 MED ORDER — FLUTICASONE PROPIONATE 50 MCG/ACT NA SUSP: 2.0000 | Freq: Every day | NASAL | Status: DC

## 2011-07-20 NOTE — Telephone Encounter (Signed)
done

## 2011-07-24 ENCOUNTER — Telehealth: Payer: Self-pay | Admitting: Internal Medicine

## 2011-07-25 ENCOUNTER — Other Ambulatory Visit: Payer: Self-pay | Admitting: *Deleted

## 2011-07-25 DIAGNOSIS — I10 Essential (primary) hypertension: Secondary | ICD-10-CM

## 2011-07-25 MED ORDER — LOSARTAN POTASSIUM-HCTZ 100-25 MG PO TABS: 1.0000 | ORAL_TABLET | Freq: Every day | ORAL | Status: DC

## 2011-07-25 NOTE — Telephone Encounter (Signed)
Sent refill to [facility].  

## 2011-07-25 NOTE — Telephone Encounter (Signed)
done

## 2011-08-01 ENCOUNTER — Telehealth: Payer: Self-pay | Admitting: Family Medicine

## 2011-08-01 DIAGNOSIS — I10 Essential (primary) hypertension: Secondary | ICD-10-CM

## 2011-08-01 MED ORDER — ATENOLOL 25 MG PO TABS: 25.0000 mg | ORAL_TABLET | Freq: Every day | ORAL | Status: DC

## 2011-08-01 NOTE — Telephone Encounter (Signed)
Done

## 2011-08-01 NOTE — Telephone Encounter (Signed)
Fax req Atenolol 25 mg 1 po qd  #90   [employer]  [facility]

## 2011-08-08 ENCOUNTER — Other Ambulatory Visit: Payer: Self-pay | Admitting: Family Medicine

## 2011-08-15 ENCOUNTER — Encounter: Payer: Self-pay | Admitting: Family Medicine

## 2011-08-15 ENCOUNTER — Ambulatory Visit (INDEPENDENT_AMBULATORY_CARE_PROVIDER_SITE_OTHER): Payer: Medicare Other | Admitting: Family Medicine

## 2011-08-15 VITALS — BP 126/86 | HR 64 | Ht 65.0 in | Wt 152.0 lb

## 2011-08-15 DIAGNOSIS — I10 Essential (primary) hypertension: Secondary | ICD-10-CM

## 2011-08-15 DIAGNOSIS — J309 Allergic rhinitis, unspecified: Secondary | ICD-10-CM

## 2011-08-15 DIAGNOSIS — E782 Mixed hyperlipidemia: Secondary | ICD-10-CM

## 2011-08-15 DIAGNOSIS — E039 Hypothyroidism, unspecified: Secondary | ICD-10-CM

## 2011-08-15 MED ORDER — LOSARTAN POTASSIUM-HCTZ 100-25 MG PO TABS: 1.0000 | ORAL_TABLET | Freq: Every day | ORAL | Status: DC

## 2011-08-15 MED ORDER — ATENOLOL 25 MG PO TABS: 25.0000 mg | ORAL_TABLET | Freq: Every day | ORAL | Status: DC

## 2011-08-15 NOTE — Patient Instructions (Addendum)
Continue flonase for allergies.  You can use Allegra just as needed, if having symptoms despite flonase use.  Allegra doesn't need to be used daily, but can be if needed.  You may need to restart it to help with your itchy ears (not just runny nose, sneezing).  Hemorrhoids--if flaring, try Anusol HC cream as directed.  If worsening symptoms, return for evaluation.  Use cream for external hemorrhoids, suppository if you have internal hemorrhoids.  Aleve--since pain has resolved (shoulder), no need to continue taking this on a regular basis.  Recommended changing to Tylenol Arthritis if needed. Only to use Aleve short-term for an inflammatory condition.  Check blood pressure periodically at home (once a week is fine). Goal <135/85.  Follow up sooner than 6 months if persistently is running higher than that.

## 2011-08-15 NOTE — Progress Notes (Signed)
Chief Complaint  Patient presents with  . Hypertension    6 month follow-up on hypertention and other things   HPI:  Hypertension follow-up:  Blood pressures elsewhere are 120's/80's at other doctors, doesn't check it at home or pharmacy.  Denies dizziness, headaches, chest pain, edema.  Denies side effects of medications.  Hyperlipidemia follow-up:  Patient is reportedly following a low-fat, low cholesterol diet.  Compliant with medications and denies medication side effects.  Due for labs.  Hypothyroidism:  Denies any significant symptoms--occasionally feels cold.  Denies hair loss, weight gain, fatigue.  Has been exercising less.  Allergies:  She stopped taking Allegra a couple of months ago, and has had some watery eyes (treated with drops), occasional sneezing and runny nose. She continues to use the Flonase every night.  She is surprised that she didn't have significant allergy symptoms after stopping the Allegra.  Recent impetigo--resolved, but ears are still often itchy.  Had R shoulder pain a few months ago, when she was working out a lot.  She stopped exercising, and took Aleve BID.  She cut out the morning pill, just taking it at night now.  Past Medical History  Diagnosis Date  . Hypertension   . Hyperlipidemia   . Hyperthyroidism   . Allergy     seasonal allergies  . IBS (irritable bowel syndrome) 1984  . Colon polyp 09/2000  . Macular degeneration 2008  . Basal cell carcinoma 2007    face-Dr.Drew Yetta Barre  . Diverticulosis     seen on colonoscopy  . Breast cancer 1989    R breast-microinvasive papillary(lumpectomy and XRT)  . Subdural hematoma 06/2008    Dr.Cram  . Elevated liver function tests 06/2009    galllstones and dialted CBD--s/p ERCP and chole  . Sullivan Lone syndrome     Past Surgical History  Procedure Date  . Abdominal hysterectomy     bladder repair  . Breast lumpectomy 1989    right breast  . Total knee arthroplasty 2000    right  . Total knee  arthroplasty 2004    left knee  . Hammer toe surgery 05/2004    right foot  . Fracture left foot 01/2007    5th metatarsal-treated with boot  . Tubal ligation   . Ercp 07/2009    sphincerotomy  . Laparoscopic cholecystectomy 07/2009    History   Social History  . Marital Status: Married    Spouse Name: N/A    Number of Children: 2  . Years of Education: N/A   Occupational History  .     Social History Main Topics  . Smoking status: Former Smoker -- 0.1 packs/day for 3 years    Quit date: 03/27/1963  . Smokeless tobacco: Not on file  . Alcohol Use: Yes     1 glass of wine per week.  . Drug Use: No  . Sexually Active: Not on file   Other Topics Concern  . Not on file   Social History Narrative   Retired Theme park manager    Family History  Problem Relation Age of Onset  . Leukemia Mother   . Ovarian cancer Mother     ??? hysterectomy age 46's  . Cancer Mother     leukemia; possible ovarian cancer (hysterectomy in her 61's)  . Coronary artery disease Father   . Hypertension Father   . Heart disease Father     onset late 29's  . Coronary artery disease Brother 18    coronary artery  bypass grafting, pacemaker  . Heart disease Brother     CABG in 60's, pacemaker  . Breast cancer Maternal Aunt   . Cancer Maternal Aunt     breast  . Diabetes Paternal Uncle   . Diabetes Paternal Grandmother   . Cancer Cousin     breast   Current Outpatient Prescriptions on File Prior to Visit  Medication Sig Dispense Refill  . Calcium Carbonate-Vitamin D (CALTRATE 600+D) 600-400 MG-UNIT per tablet Take 1 tablet by mouth 2 (two) times daily.        . Cholecalciferol (VITAMIN D) 2000 UNITS tablet Take 2,000 Units by mouth daily.        . fluticasone (FLONASE) 50 MCG/ACT nasal spray Place 2 sprays into the nose daily.  16 g  11  . GLUCOSAMINE PO Take 2,000 mg by mouth 2 (two) times daily.        . Multiple Vitamins-Minerals (CENTRUM SILVER PO) Take 1 tablet by mouth daily.         . Multiple Vitamins-Minerals (PRESERVISION/LUTEIN) CAPS Take 1 capsule by mouth 2 (two) times daily.        . simvastatin (ZOCOR) 20 MG tablet TAKE 1 TABLET BY MOUTH AT BEDTIME.  90 tablet  0  . SYNTHROID 25 MCG tablet Take 1 tablet by mouth Daily.      Marland Kitchen DISCONTD: atenolol (TENORMIN) 25 MG tablet Take 1 tablet (25 mg total) by mouth daily.  30 tablet  0  . DISCONTD: losartan-hydrochlorothiazide (HYZAAR) 100-25 MG per tablet Take 1 tablet by mouth daily.  30 tablet  0  . fexofenadine (ALLEGRA) 180 MG tablet Take 180 mg by mouth daily.          Allergies  Allergen Reactions  . Percocet (Oxycodone-Acetaminophen) Other (See Comments)    Upset stomach   ROS: Occasional aches in her hips.  Denies fevers.  No chest pain, palpitations, shortness of breath, leg swelling, skin rashes, bowel changes, urinary complaints.  Has hemorrhoids, which occasionally flare. Having some itching recently at rectal area.  See HPI for other concerns.  PHYSICAL EXAM: BP 126/86  Pulse 64  Ht 5\' 5"  (1.651 m)  Wt 152 lb (68.947 kg)  BMI 25.29 kg/m2 126/86 on repeat by MD, [place] Well developed, pleasant, talkative female in no distress Neck: no lymphadenopathy, thyromegaly or bruit Heart: regular rate and rhythm without murmur Lungs: clear bilaterally Abdomen: soft, nontender, no organomegaly or mass Extremities: no edema, 2+ pulse Skin: slightly irritated-appearing AK R forearm.  Some dilated small veins in lower legs Psych: talkative. Normal mood, affect, hygiene and grooming  ASSESSMENT/PLAN: 1. Essential hypertension, benign    2. Mixed hyperlipidemia  Lipid panel, Hepatic function panel  3. Unspecified hypothyroidism  TSH  4. Unspecified essential hypertension  losartan-hydrochlorothiazide (HYZAAR) 100-25 MG per tablet, atenolol (TENORMIN) 25 MG tablet  5. Allergic rhinitis, cause unspecified      HTN--borderline control of diastolic.  Patient advised to monitor BP at home periodically.  F/u sooner than  6 months if persistently >135/85 or higher.  Allergies--continue Flonase, which is controlling allergies for the most part, and can use Allegra or other antihistamines just a needed.  She seems to be complaining a lot of itchy ears, so recommended restarting it.  Hemorrhoids--if flaring, try Anusol HC cream as directed.  If worsening symptoms, return for evaluation  Aleve--since pain has resolved (shoulder), no need to continue taking this on a regular basis.  Discussed risks of longterm use.  Recommended changing to Tylenol  Arthritis if needed. Only to use Aleve short-term for an inflammatory condition  F/u 6 months for CPE/med check

## 2011-08-16 ENCOUNTER — Encounter: Payer: Self-pay | Admitting: Family Medicine

## 2011-08-16 LAB — HEPATIC FUNCTION PANEL
ALT: 17 U/L (ref 0–35)
AST: 24 U/L (ref 0–37)
Bilirubin, Direct: 0.3 mg/dL (ref 0.0–0.3)
Total Protein: 6 g/dL (ref 6.0–8.3)

## 2011-08-16 LAB — LIPID PANEL
Cholesterol: 161 mg/dL (ref 0–200)
HDL: 57 mg/dL (ref >39)
Total CHOL/HDL Ratio: 2.8 Ratio
Triglycerides: 173 mg/dL — ABNORMAL HIGH (ref <150)

## 2011-08-16 LAB — TSH: TSH: 1.047 u[IU]/mL (ref 0.350–4.500)

## 2011-10-01 ENCOUNTER — Telehealth: Payer: Self-pay | Admitting: Family Medicine

## 2011-10-01 DIAGNOSIS — E039 Hypothyroidism, unspecified: Secondary | ICD-10-CM

## 2011-10-01 MED ORDER — LEVOTHYROXINE SODIUM 25 MCG PO TABS: 25.0000 ug | ORAL_TABLET | Freq: Every day | ORAL | Status: DC

## 2011-10-01 NOTE — Telephone Encounter (Signed)
Done

## 2011-11-01 ENCOUNTER — Other Ambulatory Visit: Payer: Self-pay | Admitting: Family Medicine

## 2011-11-01 DIAGNOSIS — Z1231 Encounter for screening mammogram for malignant neoplasm of breast: Secondary | ICD-10-CM

## 2011-11-05 ENCOUNTER — Other Ambulatory Visit: Payer: Self-pay | Admitting: Family Medicine

## 2011-12-03 ENCOUNTER — Ambulatory Visit (HOSPITAL_COMMUNITY)
Admission: RE | Admit: 2011-12-03 | Discharge: 2011-12-03 | Disposition: A | Discharge status: Home / Self Care | Payer: Medicare Other | Source: Ambulatory Visit | Attending: Family Medicine | Admitting: Family Medicine

## 2011-12-03 DIAGNOSIS — Z1231 Encounter for screening mammogram for malignant neoplasm of breast: Secondary | ICD-10-CM

## 2011-12-04 ENCOUNTER — Other Ambulatory Visit: Payer: Self-pay

## 2012-02-14 ENCOUNTER — Ambulatory Visit (INDEPENDENT_AMBULATORY_CARE_PROVIDER_SITE_OTHER): Payer: Medicare Other | Admitting: Family Medicine

## 2012-02-14 ENCOUNTER — Encounter: Payer: Self-pay | Admitting: Family Medicine

## 2012-02-14 VITALS — BP 122/74 | HR 72 | Temp 98.1°F | Ht 65.0 in | Wt 153.0 lb

## 2012-02-14 DIAGNOSIS — J069 Acute upper respiratory infection, unspecified: Secondary | ICD-10-CM

## 2012-02-14 NOTE — Patient Instructions (Addendum)
Continue mucinex Restart Allegra, continue Flonase Try Delsym for cough.  If it isn't helping, we can consider other cough meds (hydrocodone, which can only be taken at night, vs tessalon for during day).  Recheck next week at CPE as planned--if having fevers, worsening cough/symptoms, then will start antibiotics.

## 2012-02-14 NOTE — Progress Notes (Signed)
Chief Complaint  Patient presents with  . Cough    started a week ago with a runny nose, seemed to have gotten more congested-yellow/green mucus. Last night coughed a lot, didn't sleep much. Stopped her allegra and started mucinex Monday, last dose last night. Itchy ears as well.   HPI: Started with runny nose last week (despite being on her regular allergy meds).  Then started with a cough, and is productive of yellow-green mucus.  Getting up discolored phlegm only in the mornings, but noticing discolored nasal mucus throughout the day.  Coughed a lot last night, and throat is getting sore.  Denies fevers, shortness of breath. Occasionally itchy ears, no ear pain.  No known sick contacts.  She has been using Mucinex twice daily, stopped the Allegra, and has continued the Flonase.  Past Medical History  Diagnosis Date  . Hypertension   . Hyperlipidemia   . Hyperthyroidism   . Allergy     seasonal allergies  . IBS (irritable bowel syndrome) 1984  . Colon polyp 09/2000  . Macular degeneration 2008  . Basal cell carcinoma 2007    face-Dr.Drew Yetta Barre  . Diverticulosis     seen on colonoscopy  . Breast cancer 1989    R breast-microinvasive papillary(lumpectomy and XRT)  . Subdural hematoma 06/2008    Dr.Cram  . Elevated liver function tests 06/2009    galllstones and dialted CBD--s/p ERCP and chole  . Sullivan Lone syndrome    Past Surgical History  Procedure Date  . Abdominal hysterectomy     bladder repair  . Breast lumpectomy 1989    right breast  . Total knee arthroplasty 2000    right  . Total knee arthroplasty 2004    left knee  . Hammer toe surgery 05/2004    right foot  . Fracture left foot 01/2007    5th metatarsal-treated with boot  . Tubal ligation   . Ercp 07/2009    sphincerotomy  . Laparoscopic cholecystectomy 07/2009   History   Social History  . Marital Status: Married    Spouse Name: N/A    Number of Children: 2  . Years of Education: N/A   Occupational  History  .     Social History Main Topics  . Smoking status: Former Smoker -- 0.1 packs/day for 3 years    Quit date: 03/27/1963  . Smokeless tobacco: Not on file  . Alcohol Use: Yes     Comment: 1 glass of wine per week.  . Drug Use: No  . Sexually Active: Not on file   Other Topics Concern  . Not on file   Social History Narrative   Retired Theme park manager    Current outpatient prescriptions:atenolol (TENORMIN) 25 MG tablet, Take 1 tablet (25 mg total) by mouth daily., Disp: 90 tablet, Rfl: 1;  Calcium Carbonate-Vitamin D (CALTRATE 600+D) 600-400 MG-UNIT per tablet, Take 1 tablet by mouth 2 (two) times daily.  , Disp: , Rfl: ;  Cholecalciferol (VITAMIN D) 2000 UNITS tablet, Take 2,000 Units by mouth daily.  , Disp: , Rfl:  fluticasone (FLONASE) 50 MCG/ACT nasal spray, Place 2 sprays into the nose daily., Disp: 16 g, Rfl: 11;  GLUCOSAMINE PO, Take 2,000 mg by mouth 2 (two) times daily.  , Disp: , Rfl: ;  levothyroxine (SYNTHROID) 25 MCG tablet, Take 1 tablet (25 mcg total) by mouth daily., Disp: 30 tablet, Rfl: 4;  losartan-hydrochlorothiazide (HYZAAR) 100-25 MG per tablet, Take 1 tablet by mouth daily., Disp: 90 tablet,  Rfl: 1 Multiple Vitamins-Minerals (CENTRUM SILVER PO), Take 1 tablet by mouth daily.  , Disp: , Rfl: ;  Multiple Vitamins-Minerals (PRESERVISION/LUTEIN) CAPS, Take 1 capsule by mouth 2 (two) times daily.  , Disp: , Rfl: ;  simvastatin (ZOCOR) 20 MG tablet, TAKE 1 TABLET BY MOUTH AT BEDTIME., Disp: 90 tablet, Rfl: 1;  fexofenadine (ALLEGRA) 180 MG tablet, Take 180 mg by mouth daily.  , Disp: , Rfl:  guaiFENesin (MUCINEX) 600 MG 12 hr tablet, Take 1,200 mg by mouth 2 (two) times daily., Disp: , Rfl:   Allergies  Allergen Reactions  . Percocet (Oxycodone-Acetaminophen) Other (See Comments)    Upset stomach   ROS: Denies nausea, vomiting, diarrhea, skin rash or fevers.  Denies chest pain. Denies fevers, shortness of breath, myalgias.  PHYSICAL EXAM: BP 122/74  Pulse  72  Temp 98.1 F (36.7 C) (Oral)  Ht 5\' 5"  (1.651 m)  Wt 153 lb (69.4 kg)  BMI 25.46 kg/m2  Well developed female, sounds nasal with rare dry cough HEENT:  PERRL, EOMI, conjunctiva clear.  TM's and EAC's normal.  OP clear. Sinuses nontender. Nasal mucosa mild-mod edema, yellow crusting, slight erythema. Neck: no lymphadenopathy or mass Heart: regular rate and rhythm without murmur Lungs: clear bilaterally Skin: no rash  ASSESSMENT/PLAN: 1. URI (upper respiratory infection)    Continue mucinex Restart Allegra, continue Flonase Try Delsym for cough. Recheck next week at CPE as planned--if having fevers, worsening cough/symptoms, then will start ABX.

## 2012-02-18 ENCOUNTER — Encounter: Payer: Self-pay | Admitting: Family Medicine

## 2012-02-18 ENCOUNTER — Ambulatory Visit (INDEPENDENT_AMBULATORY_CARE_PROVIDER_SITE_OTHER): Payer: Medicare Other | Admitting: Family Medicine

## 2012-02-18 VITALS — BP 132/84 | HR 72 | Ht 65.25 in | Wt 152.0 lb

## 2012-02-18 DIAGNOSIS — J309 Allergic rhinitis, unspecified: Secondary | ICD-10-CM

## 2012-02-18 DIAGNOSIS — R3915 Urgency of urination: Secondary | ICD-10-CM

## 2012-02-18 DIAGNOSIS — Z0000 Encounter for general adult medical examination without abnormal findings: Secondary | ICD-10-CM

## 2012-02-18 DIAGNOSIS — I10 Essential (primary) hypertension: Secondary | ICD-10-CM

## 2012-02-18 DIAGNOSIS — N390 Urinary tract infection, site not specified: Secondary | ICD-10-CM

## 2012-02-18 DIAGNOSIS — R233 Spontaneous ecchymoses: Secondary | ICD-10-CM

## 2012-02-18 DIAGNOSIS — J069 Acute upper respiratory infection, unspecified: Secondary | ICD-10-CM

## 2012-02-18 DIAGNOSIS — R238 Other skin changes: Secondary | ICD-10-CM

## 2012-02-18 DIAGNOSIS — E782 Mixed hyperlipidemia: Secondary | ICD-10-CM

## 2012-02-18 LAB — LIPID PANEL
Cholesterol: 170 mg/dL (ref 0–200)
HDL: 51 mg/dL (ref >39)
Total CHOL/HDL Ratio: 3.3 Ratio

## 2012-02-18 LAB — COMPREHENSIVE METABOLIC PANEL
Albumin: 4.6 g/dL (ref 3.5–5.2)
Alkaline Phosphatase: 82 U/L (ref 39–117)
BUN: 18 mg/dL (ref 6–23)
CO2: 31 mEq/L (ref 19–32)
Glucose, Bld: 107 mg/dL — ABNORMAL HIGH (ref 70–99)
Total Bilirubin: 1.4 mg/dL — ABNORMAL HIGH (ref 0.3–1.2)

## 2012-02-18 LAB — POCT URINALYSIS DIPSTICK
Bilirubin, UA: NEGATIVE
Glucose, UA: NEGATIVE
Ketones, UA: NEGATIVE
Nitrite, UA: POSITIVE

## 2012-02-18 MED ORDER — ATENOLOL 25 MG PO TABS: 25.0000 mg | ORAL_TABLET | Freq: Every day | ORAL | Status: DC

## 2012-02-18 MED ORDER — LOSARTAN POTASSIUM-HCTZ 100-25 MG PO TABS: 1.0000 | ORAL_TABLET | Freq: Every day | ORAL | Status: DC

## 2012-02-18 MED ORDER — SIMVASTATIN 20 MG PO TABS: 20.0000 mg | ORAL_TABLET | Freq: Every day | ORAL | Status: DC

## 2012-02-18 MED ORDER — SULFAMETHOXAZOLE-TRIMETHOPRIM 800-160 MG PO TABS: 1.0000 | ORAL_TABLET | Freq: Two times a day (BID) | ORAL | Status: DC

## 2012-02-18 NOTE — Progress Notes (Signed)
Chief Complaint  Patient presents with  . Annual Exam    fasting annual exam with pap or pelvic-last pap 02/21/10 with Dr.Tomlin. UA showed max leuks and trace blood-pt states that she doe shave some leakage this past week. Did not do eye exam as pt sees optho 2 x a year.   Courtney Castro is a 78 y.o. female who presents for a complete physical.  She has the following concerns:  She is here for fasting labs and med check, and complaining of ongoing cough (seen last week with URI symptoms).  She feels like it is a tickle in her throat.  Coughing up some thick light green mucus, sometimes it is clear, throughout the day.  Cough is worse in the morning.  Has been using Mucinex, Delsym, and Allegra.  Denies fevers.  She is having urinary urgency and frequency, just since she started the Mucinex over the last week.  She has a little leakage of urine as well, if she doesn't get to the bathroom in time, which is unusual for her. She thinks the odor to the urine may have been going on for even longer.  HTN: Occasionally checks BP's and run 128/80.  Denies dizziness or headaches, muscle cramps, edema.  Compliant with medications.  Hyperlipidemia follow-up:  Patient is reportedly following a low-fat, low cholesterol diet.  Compliant with medications and denies medication side effects  Hypothyroidism:  She denies any symptoms related to thyroid (reviewed in detail); compliant with taking her medication. Last TSH was normal in May.  Allergies: usually very well controlled--currently has URI.  Changed to Allegra from Zyrtec and likes it better.  Health Maintenance: Immunization History  Administered Date(s) Administered  . Influenza Split 12/11/2009, 01/02/2011, 12/21/2011  . Pneumococcal Polysaccharide 11/07/2001, 01/31/2011  . Td 12/18/2004  . Tdap 01/31/2011  . Zoster 08/25/2007   Last Pap smear: 2011, s/p hysterectomy for benign reasons Last mammogram: 11/2011    Last colonoscopy: 2007 ([facility])  Last DEXA: 10/09 (normal; at [facility])  Dentist: twice yearly  Ophtho: twice yearly  Exercise:  2 days/week (weights and walking at the [facility]), although had stopped for a few months due to shoulder and low back pain (which has resolved).    Past Medical History  Diagnosis Date  . Hypertension   . Hyperlipidemia   . Hyperthyroidism   . Allergy     seasonal allergies  . IBS (irritable bowel syndrome) 1984  . Colon polyp 09/2000  . Macular degeneration 2008  . Basal cell carcinoma 2007    face-Dr.Drew Yetta Barre (now sees Dr. Lovenia Kim)  . Diverticulosis     seen on colonoscopy  . Breast cancer 1989    R breast-microinvasive papillary(lumpectomy and XRT)  . Subdural hematoma 06/2008    Dr.Cram  . Elevated liver function tests 06/2009    galllstones and dilated CBD--s/p ERCP and chole  . Sullivan Lone syndrome     Past Surgical History  Procedure Date  . Abdominal hysterectomy     bladder repair  . Breast lumpectomy 1989    right breast  . Total knee arthroplasty 2000    right  . Total knee arthroplasty 2004    left knee  . Hammer toe surgery 05/2004    right foot  . Fracture left foot 01/2007    5th metatarsal-treated with boot  . Tubal ligation   . Ercp 07/2009    sphincerotomy  . Laparoscopic cholecystectomy 07/2009    History   Social History  .  Marital Status: Married    Spouse Name: N/A    Number of Children: 2  . Years of Education: N/A   Occupational History  .     Social History Main Topics  . Smoking status: Former Smoker -- 0.1 packs/day for 3 years    Quit date: 03/27/1963  . Smokeless tobacco: Never Used  . Alcohol Use: Yes     Comment: 1 glass of wine per week.  . Drug Use: No  . Sexually Active: Not on file   Other Topics Concern  . Not on file   Social History Narrative   Retired Theme park manager.  1 daughter in [place] ([name]), and 1 daughter in [place])    Family History  Problem Relation Age of Onset  . Leukemia Mother     . Ovarian cancer Mother     ??? hysterectomy age 8's  . Cancer Mother     leukemia; possible ovarian cancer (hysterectomy in her 53's)  . Coronary artery disease Father   . Hypertension Father   . Heart disease Father     onset late 65's  . Coronary artery disease Brother 60    coronary artery bypass grafting, pacemaker  . Heart disease Brother     CABG in 60's, pacemaker  . Breast cancer Maternal Aunt   . Cancer Maternal Aunt     breast  . Diabetes Paternal Uncle   . Diabetes Paternal Grandmother   . Cancer Cousin     breast    Current outpatient prescriptions:aspirin 81 MG tablet, Take 81 mg by mouth daily., Disp: , Rfl: ;  atenolol (TENORMIN) 25 MG tablet, Take 1 tablet (25 mg total) by mouth daily., Disp: 90 tablet, Rfl: 1;  Calcium Carbonate-Vitamin D (CALTRATE 600+D) 600-400 MG-UNIT per tablet, Take 1 tablet by mouth 2 (two) times daily.  , Disp: , Rfl: ;  Cholecalciferol (VITAMIN D) 2000 UNITS tablet, Take 2,000 Units by mouth daily.  , Disp: , Rfl:  fexofenadine (ALLEGRA) 180 MG tablet, Take 180 mg by mouth daily.  , Disp: , Rfl: ;  fluticasone (FLONASE) 50 MCG/ACT nasal spray, Place 2 sprays into the nose daily., Disp: 16 g, Rfl: 11;  GLUCOSAMINE PO, Take 2,000 mg by mouth 2 (two) times daily.  , Disp: , Rfl: ;  guaiFENesin (MUCINEX) 600 MG 12 hr tablet, Take 1,200 mg by mouth 2 (two) times daily., Disp: , Rfl:  levothyroxine (SYNTHROID) 25 MCG tablet, Take 1 tablet (25 mcg total) by mouth daily., Disp: 30 tablet, Rfl: 4;  losartan-hydrochlorothiazide (HYZAAR) 100-25 MG per tablet, Take 1 tablet by mouth daily., Disp: 90 tablet, Rfl: 1;  Multiple Vitamins-Minerals (CENTRUM SILVER PO), Take 1 tablet by mouth daily.  , Disp: , Rfl: ;  Multiple Vitamins-Minerals (PRESERVISION/LUTEIN) CAPS, Take 1 capsule by mouth 2 (two) times daily.  , Disp: , Rfl:  simvastatin (ZOCOR) 20 MG tablet, Take 1 tablet (20 mg total) by mouth at bedtime., Disp: 90 tablet, Rfl: 1;  [DISCONTINUED] atenolol  (TENORMIN) 25 MG tablet, Take 1 tablet (25 mg total) by mouth daily., Disp: 90 tablet, Rfl: 1;  [DISCONTINUED] losartan-hydrochlorothiazide (HYZAAR) 100-25 MG per tablet, Take 1 tablet by mouth daily., Disp: 90 tablet, Rfl: 1 [DISCONTINUED] simvastatin (ZOCOR) 20 MG tablet, TAKE 1 TABLET BY MOUTH AT BEDTIME., Disp: 90 tablet, Rfl: 1;  sulfamethoxazole-trimethoprim (BACTRIM DS,SEPTRA DS) 800-160 MG per tablet, Take 1 tablet by mouth 2 (two) times daily., Disp: 20 tablet, Rfl: 0  Allergies  Allergen Reactions  .  Percocet (Oxycodone-Acetaminophen) Other (See Comments)    Upset stomach   ROS: The patient denies anorexia, fever, weight changes, headaches, vision changes, decreased hearing, ear pain, sore throat, breast concerns, chest pain, palpitations, dizziness, syncope, dyspnea on exertion, swelling, nausea, vomiting, diarrhea, constipation, abdominal pain, melena, hematochezia, indigestion/heartburn, hematuria, dysuria, vaginal bleeding, discharge, odor or itch, genital lesions, joint pains, numbness, tingling, weakness, tremor, suspicious skin lesions, depression, anxiety, abnormal bleeding/bruising, or enlarged lymph nodes.  Complaining about the appearance of the veins in her leg.  Denies pain, swelling. +urinary leakage and odor as per HPI  PHYSICAL EXAM: BP 132/84  Pulse 72  Ht 5' 5.25" (1.657 m)  Wt 152 lb (68.947 kg)  BMI 25.10 kg/m2  General Appearance:  Alert, cooperative, no distress, appears stated age   Head:  Normocephalic, without obvious abnormality, atraumatic   Eyes:  PERRL, conjunctiva/corneas clear, EOM's intact, fundi  benign   Ears:  Normal TM's and external ear canals   Nose:  Nares normal, mucosa mild-mod edema, with whitish-yellow thick crusting. Sinuses nontender  Throat:  Lips, mucosa, and tongue normal; teeth and gums normal   Neck:  Supple, no lymphadenopathy; thyroid: no enlargement/tenderness/nodules; no carotid  bruit or JVD   Back:  Spine nontender, no  curvature, ROM normal, no CVA Tenderness.  Lungs:  Clear to auscultation bilaterally without wheezes, rales or ronchi; respirations unlabored   Chest Wall:  No tenderness or deformity   Heart:  Regular rate and rhythm, S1 and S2 normal, no murmur, rub  or gallop   Breast Exam:  No tenderness, masses, or nipple discharge or inversion. No axillary lymphadenopathy. WHSS R breast at 9 o'clock. Left breast larger than right.   Abdomen:  Soft, non-tender, nondistended, normoactive bowel sounds,  no masses, no hepatosplenomegaly   Genitalia:  Normal external genitalia without lesions. BUS and vagina normal;  Adnexa not palpable. Uterus surgically absent. No masses or tenderness. Pap not performed   Rectal:  Normal tone, no masses or tenderness; guaiac negative stool   Extremities:  No clubbing, cyanosis or edema   Pulses:  2+ and symmetric all extremities   Skin:  Skin color, texture, turgor normal, no rashes or lesions. Many seborrheic keratosis throughout. Superficial small veins bilateral lower extremities, nontender.  Small bruise anterior ankle on R  Lymph nodes:  Cervical, supraclavicular, and axillary nodes normal   Neurologic:  CNII-XII intact, normal strength, sensation and gait; reflexes 2+ and symmetric throughout   Psych: Normal mood, affect, hygiene and grooming.    ASSESSMENT/PLAN:  1. Routine general medical examination at a health care facility  POCT Urinalysis Dipstick  2. Allergic rhinitis, cause unspecified    3. Essential hypertension, benign  Comprehensive metabolic panel  4. Mixed hyperlipidemia  Lipid panel, Comprehensive metabolic panel, simvastatin (ZOCOR) 20 MG tablet  5. Easy bruisability  CBC with Differential  6. Urinary urgency  Urine culture  7. UTI (lower urinary tract infection)  Urine culture, sulfamethoxazole-trimethoprim (BACTRIM DS,SEPTRA DS) 800-160 MG per tablet  8. Unspecified essential hypertension  atenolol (TENORMIN) 25 MG tablet,  losartan-hydrochlorothiazide (HYZAAR) 100-25 MG per tablet  9. URI (upper respiratory infection)  sulfamethoxazole-trimethoprim (BACTRIM DS,SEPTRA DS) 800-160 MG per tablet   UTI--treat with Septra.  Risks/side effects reviewed.  Urine culture sent. URI--worsening, now discolored.  Septra should cover sinus/bronchitis as well HTN--well controlled Lipids--due for labs Hypothyroidism--asymptomatic, normal TSH in May--can just check yearly (not checked today). Allergies--controlled overall.  Discussed monthly self breast exams and yearly mammograms; at least 30 minutes of  aerobic activity at least 5 days/week; proper sunscreen use reviewed; healthy diet, including goals of calcium and vitamin D intake and alcohol recommendations (less than or equal to 1 drink/day) reviewed; regular seatbelt use; changing batteries in smoke detectors. Immunization recommendations discussed--UTD. Flu shots annually. Colonoscopy recommendations reviewed--given history of polyps, likely past due now (q 5 years). Patient will call GI office --she was asked to do this last year and never did.

## 2012-02-18 NOTE — Patient Instructions (Signed)
Please call [facility]--see if you are due for colonoscopy (given your history of polyps, you were likely due in 201).  HEALTH MAINTENANCE RECOMMENDATIONS:  It is recommended that you get at least 30 minutes of aerobic exercise at least 5 days/week (for weight loss, you may need as much as 60-90 minutes). This can be any activity that gets your heart rate up. This can be divided in 10-15 minute intervals if needed, but try and build up your endurance at least once a week.  Weight bearing exercise is also recommended twice weekly.  Eat a healthy diet with lots of vegetables, fruits and fiber.  "Colorful" foods have a lot of vitamins (ie green vegetables, tomatoes, red peppers, etc).  Limit sweet tea, regular sodas and alcoholic beverages, all of which has a lot of calories and sugar.  Up to 1 alcoholic drink daily may be beneficial for women (unless trying to lose weight, watch sugars).  Drink a lot of water.  Calcium recommendations are 1200-1500 mg daily (1500 mg for postmenopausal women or women without ovaries), and vitamin D 1000 IU daily.  This should be obtained from diet and/or supplements (vitamins), and calcium should not be taken all at once, but in divided doses.  Monthly self breast exams and yearly mammograms for women over the age of 38 is recommended.  Sunscreen of at least SPF 30 should be used on all sun-exposed parts of the skin when outside between the hours of 10 am and 4 pm (not just when at beach or pool, but even with exercise, golf, tennis, and yard work!)  Use a sunscreen that says "broad spectrum" so it covers both UVA and UVB rays, and make sure to reapply every 1-2 hours.  Remember to change the batteries in your smoke detectors when changing your clock times in the spring and fall.  Use your seat belt every time you are in a car, and please drive safely and not be distracted with cell phones and texting while driving.

## 2012-02-19 ENCOUNTER — Encounter: Payer: Self-pay | Admitting: Family Medicine

## 2012-02-19 DIAGNOSIS — R7301 Impaired fasting glucose: Secondary | ICD-10-CM | POA: Insufficient documentation

## 2012-02-19 LAB — CBC WITH DIFFERENTIAL/PLATELET
Eosinophils Absolute: 0.3 10*3/uL (ref 0.0–0.7)
Hemoglobin: 15.1 g/dL — ABNORMAL HIGH (ref 12.0–15.0)
Lymphocytes Relative: 17 % (ref 12–46)
Lymphs Abs: 1.5 10*3/uL (ref 0.7–4.0)
MCH: 29.5 pg (ref 26.0–34.0)
MCV: 85.4 fL (ref 78.0–100.0)
Monocytes Relative: 14 % — ABNORMAL HIGH (ref 3–12)
Neutrophils Relative %: 65 % (ref 43–77)
Platelets: 267 10*3/uL (ref 150–400)
RBC: 5.12 MIL/uL — ABNORMAL HIGH (ref 3.87–5.11)
WBC: 8.7 10*3/uL (ref 4.0–10.5)

## 2012-02-21 LAB — URINE CULTURE

## 2012-02-25 ENCOUNTER — Other Ambulatory Visit: Payer: Self-pay | Admitting: Family Medicine

## 2012-02-25 NOTE — Telephone Encounter (Signed)
Called in med 

## 2012-02-25 NOTE — Telephone Encounter (Signed)
Called med in 

## 2012-05-26 ENCOUNTER — Ambulatory Visit (INDEPENDENT_AMBULATORY_CARE_PROVIDER_SITE_OTHER): Payer: Medicare Other | Admitting: Family Medicine

## 2012-05-26 ENCOUNTER — Encounter: Payer: Self-pay | Admitting: Family Medicine

## 2012-05-26 VITALS — BP 128/76 | HR 68 | Ht 65.25 in | Wt 149.0 lb

## 2012-05-26 DIAGNOSIS — M25519 Pain in unspecified shoulder: Secondary | ICD-10-CM

## 2012-05-26 DIAGNOSIS — M778 Other enthesopathies, not elsewhere classified: Secondary | ICD-10-CM

## 2012-05-26 DIAGNOSIS — M7580 Other shoulder lesions, unspecified shoulder: Secondary | ICD-10-CM | POA: Insufficient documentation

## 2012-05-26 DIAGNOSIS — M25511 Pain in right shoulder: Secondary | ICD-10-CM

## 2012-05-26 DIAGNOSIS — M67919 Unspecified disorder of synovium and tendon, unspecified shoulder: Secondary | ICD-10-CM

## 2012-05-26 MED ORDER — MELOXICAM 7.5 MG PO TABS: 7.5000 mg | ORAL_TABLET | Freq: Every day | ORAL | Status: DC

## 2012-05-26 NOTE — Progress Notes (Signed)
Chief Complaint  Patient presents with  . Shoulder Pain    put too much weight on the machines at the gym. Been doing upper body/arm excercises-thinks she over did it. B/L shoulder pain, right worse than left. Took aleve x 10days, took Tylenol arthritis and Tylenol ES x the last 14 days-took last dose last night. Also the back of her legs are hurting because of the seated rows she was doing.   She started doing seated rows at the gym about 7 weeks ago.  Didn't have any onset of pain while exercising--started 1-2 days after.  This first occurred 7 weeks ago.  Hasn't done any weights since that time, but has had persistent pain.  Has pain in RUE >left. Has pain at her lateral shoulder, in deltoid region, extending posteriorly.  Has pain with lateral raising, but no pain lifting arm over head.  Heat temporarily helped.  The above-mentioned medications all helped ease the pain some, but she continues to have pain.  She is also having some pain in her hamstrings, and upper thighs.  She notices this getting out of bed in the morning.  No pain with walking or stairs.  She notices the discomfort in the back of her thighs while she is sitting on her sofa.  Overall, her hamstring pain has improved. Trying not to cross her legs, but thinks she does a lot.  She is no longer going to the gym, just walking.  Doesn't stretch after walking. Denies any numbness/tingling or burning pain in extremities  Past Medical History  Diagnosis Date  . Hypertension   . Hyperlipidemia   . Hyperthyroidism   . Allergy     seasonal allergies  . IBS (irritable bowel syndrome) 1984  . Colon polyp 09/2000  . Macular degeneration 2008  . Basal cell carcinoma 2007    face-Dr.Drew Yetta Barre (now sees Dr. Lovenia Kim)  . Diverticulosis     seen on colonoscopy  . Breast cancer 1989    R breast-microinvasive papillary(lumpectomy and XRT)  . Subdural hematoma 06/2008    Dr.Cram  . Elevated liver function tests 06/2009    galllstones  and dilated CBD--s/p ERCP and chole  . Sullivan Lone syndrome    Past Surgical History  Procedure Laterality Date  . Abdominal hysterectomy      bladder repair  . Breast lumpectomy  1989    right breast  . Total knee arthroplasty  2000    right  . Total knee arthroplasty  2004    left knee  . Hammer toe surgery  05/2004    right foot  . Fracture left foot  01/2007    5th metatarsal-treated with boot  . Tubal ligation    . Ercp  07/2009    sphincerotomy  . Laparoscopic cholecystectomy  07/2009   History   Social History  . Marital Status: Married    Spouse Name: N/A    Number of Children: 2  . Years of Education: N/A   Occupational History  .     Social History Main Topics  . Smoking status: Former Smoker -- 0.10 packs/day for 3 years    Quit date: 03/27/1963  . Smokeless tobacco: Never Used  . Alcohol Use: Yes     Comment: 1 glass of wine per week.  . Drug Use: No  . Sexually Active: Not on file   Other Topics Concern  . Not on file   Social History Narrative   Retired Theme park manager.  1 daughter in  [place] ([name]), and 1 daughter in [place])   Current Outpatient Prescriptions on File Prior to Visit  Medication Sig Dispense Refill  . aspirin 81 MG tablet Take 81 mg by mouth daily.      Marland Kitchen atenolol (TENORMIN) 25 MG tablet Take 1 tablet (25 mg total) by mouth daily.  90 tablet  1  . Calcium Carbonate-Vitamin D (CALTRATE 600+D) 600-400 MG-UNIT per tablet Take 1 tablet by mouth 2 (two) times daily.        . Cholecalciferol (VITAMIN D) 2000 UNITS tablet Take 2,000 Units by mouth daily.        . fexofenadine (ALLEGRA) 180 MG tablet Take 180 mg by mouth daily.        . fluticasone (FLONASE) 50 MCG/ACT nasal spray Place 2 sprays into the nose daily.  16 g  11  . GLUCOSAMINE PO Take 2,000 mg by mouth 2 (two) times daily.        Marland Kitchen levothyroxine (SYNTHROID, LEVOTHROID) 25 MCG tablet TAKE 1 TABLET (25 MCG TOTAL) BY MOUTH DAILY.  30 tablet  5  . losartan-hydrochlorothiazide  (HYZAAR) 100-25 MG per tablet Take 1 tablet by mouth daily.  90 tablet  1  . Multiple Vitamins-Minerals (CENTRUM SILVER PO) Take 1 tablet by mouth daily.        . Multiple Vitamins-Minerals (PRESERVISION/LUTEIN) CAPS Take 1 capsule by mouth 2 (two) times daily.        . simvastatin (ZOCOR) 20 MG tablet Take 1 tablet (20 mg total) by mouth at bedtime.  90 tablet  1  . guaiFENesin (MUCINEX) 600 MG 12 hr tablet Take 1,200 mg by mouth 2 (two) times daily.       No current facility-administered medications on file prior to visit.    Allergies  Allergen Reactions  . Percocet (Oxycodone-Acetaminophen) Other (See Comments)    Upset stomach   ROS:  Denies fevers, URI symptoms, abdominal pain, bleeding/bruising, ski rash, numbness, tingling, weakness or other complaints except as per HPI.  PHYSICAL EXAM: BP 128/76  Pulse 68  Ht 5' 5.25" (1.657 m)  Wt 149 lb (67.586 kg)  BMI 24.62 kg/m2 Well developed, talkative female in no obvious distress Extremities: Tender at R deltoid.   Pain with internal rotation of R shoulder, and pain with supraspinatous testing on R.  Strength intact.  Remainder of extremity exam is entirely normal.  No impingement, weakness. Neck: FROM.  Spine nontender. No muscle spasm Heart: regular rate and rhythm Lungs: clear Skin: no rashes, bruising  ASSESSMENT/PLAN:  Deltoid tendinitis, right - Plan: meloxicam (MOBIC) 7.5 MG tablet, Ambulatory referral to Physical Therapy  Shoulder pain, right - Plan: meloxicam (MOBIC) 7.5 MG tablet, Ambulatory referral to Physical Therapy   Refer to PT Hamstring stretches shown, as well as ROM exercises for UE's.  Retry NSAID short-term.  Risks/side effects reviewed of meds, NSAID precautions reviewed.  F/u prn persistence/worsening of pain

## 2012-05-26 NOTE — Patient Instructions (Addendum)
Take the meloxicam once daily with food.  Don't take any other anti-inflammatories. You can continue to use heat, and range of motion exercises. Do hamstring stretches daily.  You should be getting a call to set up physical therapy.  Return in 1-2 months if ongoing problems

## 2012-06-03 ENCOUNTER — Ambulatory Visit: Payer: Medicare Other | Attending: Family Medicine

## 2012-06-03 DIAGNOSIS — R293 Abnormal posture: Secondary | ICD-10-CM | POA: Insufficient documentation

## 2012-06-03 DIAGNOSIS — M2550 Pain in unspecified joint: Secondary | ICD-10-CM | POA: Insufficient documentation

## 2012-06-03 DIAGNOSIS — IMO0001 Reserved for inherently not codable concepts without codable children: Secondary | ICD-10-CM | POA: Insufficient documentation

## 2012-06-09 ENCOUNTER — Encounter: Payer: Medicare Other | Admitting: Rehabilitation

## 2012-06-10 ENCOUNTER — Ambulatory Visit: Payer: Medicare Other | Admitting: Rehabilitation

## 2012-06-11 ENCOUNTER — Encounter: Payer: Medicare Other | Admitting: Rehabilitation

## 2012-06-13 ENCOUNTER — Ambulatory Visit: Payer: Medicare Other | Admitting: Rehabilitation

## 2012-06-16 ENCOUNTER — Ambulatory Visit: Payer: Medicare Other

## 2012-06-18 ENCOUNTER — Ambulatory Visit: Payer: Medicare Other

## 2012-06-24 ENCOUNTER — Ambulatory Visit: Payer: Medicare Other | Attending: Rehabilitation | Admitting: Rehabilitation

## 2012-06-24 DIAGNOSIS — R293 Abnormal posture: Secondary | ICD-10-CM | POA: Insufficient documentation

## 2012-06-24 DIAGNOSIS — M2550 Pain in unspecified joint: Secondary | ICD-10-CM | POA: Insufficient documentation

## 2012-06-24 DIAGNOSIS — IMO0001 Reserved for inherently not codable concepts without codable children: Secondary | ICD-10-CM | POA: Insufficient documentation

## 2012-06-25 ENCOUNTER — Other Ambulatory Visit: Payer: Self-pay | Admitting: Family Medicine

## 2012-06-26 ENCOUNTER — Ambulatory Visit: Payer: Medicare Other | Admitting: Rehabilitation

## 2012-07-01 ENCOUNTER — Ambulatory Visit: Payer: Medicare Other | Admitting: Rehabilitation

## 2012-07-03 ENCOUNTER — Ambulatory Visit: Payer: Medicare Other | Admitting: Rehabilitation

## 2012-07-08 ENCOUNTER — Ambulatory Visit: Payer: Medicare Other

## 2012-07-10 ENCOUNTER — Ambulatory Visit: Payer: Medicare Other | Admitting: Rehabilitation

## 2012-07-22 ENCOUNTER — Ambulatory Visit: Payer: Medicare Other

## 2012-07-24 ENCOUNTER — Ambulatory Visit: Payer: Medicare Other | Attending: Rehabilitation | Admitting: Rehabilitation

## 2012-07-24 DIAGNOSIS — IMO0001 Reserved for inherently not codable concepts without codable children: Secondary | ICD-10-CM | POA: Insufficient documentation

## 2012-07-24 DIAGNOSIS — M2550 Pain in unspecified joint: Secondary | ICD-10-CM | POA: Insufficient documentation

## 2012-07-24 DIAGNOSIS — R293 Abnormal posture: Secondary | ICD-10-CM | POA: Insufficient documentation

## 2012-07-29 ENCOUNTER — Ambulatory Visit: Payer: Medicare Other | Admitting: Rehabilitation

## 2012-07-31 ENCOUNTER — Ambulatory Visit: Payer: Medicare Other | Admitting: Rehabilitation

## 2012-08-05 ENCOUNTER — Ambulatory Visit: Payer: Medicare Other

## 2012-08-07 ENCOUNTER — Ambulatory Visit: Payer: Medicare Other

## 2012-08-12 ENCOUNTER — Other Ambulatory Visit: Payer: Self-pay | Admitting: Family Medicine

## 2012-08-21 ENCOUNTER — Encounter: Payer: Self-pay | Admitting: Family Medicine

## 2012-08-21 ENCOUNTER — Ambulatory Visit (INDEPENDENT_AMBULATORY_CARE_PROVIDER_SITE_OTHER): Payer: Medicare Other | Admitting: Family Medicine

## 2012-08-21 VITALS — BP 122/82 | HR 72 | Ht 65.0 in | Wt 148.0 lb

## 2012-08-21 DIAGNOSIS — I10 Essential (primary) hypertension: Secondary | ICD-10-CM

## 2012-08-21 DIAGNOSIS — J309 Allergic rhinitis, unspecified: Secondary | ICD-10-CM

## 2012-08-21 DIAGNOSIS — R7301 Impaired fasting glucose: Secondary | ICD-10-CM

## 2012-08-21 DIAGNOSIS — M25519 Pain in unspecified shoulder: Secondary | ICD-10-CM

## 2012-08-21 DIAGNOSIS — E039 Hypothyroidism, unspecified: Secondary | ICD-10-CM

## 2012-08-21 DIAGNOSIS — M25511 Pain in right shoulder: Secondary | ICD-10-CM

## 2012-08-21 DIAGNOSIS — M778 Other enthesopathies, not elsewhere classified: Secondary | ICD-10-CM

## 2012-08-21 DIAGNOSIS — M719 Bursopathy, unspecified: Secondary | ICD-10-CM

## 2012-08-21 DIAGNOSIS — M67919 Unspecified disorder of synovium and tendon, unspecified shoulder: Secondary | ICD-10-CM

## 2012-08-21 DIAGNOSIS — E782 Mixed hyperlipidemia: Secondary | ICD-10-CM

## 2012-08-21 LAB — COMPREHENSIVE METABOLIC PANEL
BUN: 22 mg/dL (ref 6–23)
CO2: 30 mEq/L (ref 19–32)
Calcium: 10.1 mg/dL (ref 8.4–10.5)
Creat: 0.84 mg/dL (ref 0.50–1.10)
Glucose, Bld: 97 mg/dL (ref 70–99)
Total Bilirubin: 1.6 mg/dL — ABNORMAL HIGH (ref 0.3–1.2)

## 2012-08-21 LAB — LIPID PANEL
Cholesterol: 172 mg/dL (ref 0–200)
HDL: 60 mg/dL (ref >39)
Triglycerides: 135 mg/dL (ref <150)

## 2012-08-21 LAB — HEMOGLOBIN A1C: Hgb A1c MFr Bld: 5.7 % — ABNORMAL HIGH (ref <5.7)

## 2012-08-21 MED ORDER — FLUTICASONE PROPIONATE 50 MCG/ACT NA SUSP: 2.0000 | Freq: Every day | NASAL | Status: DC

## 2012-08-21 MED ORDER — ATENOLOL 25 MG PO TABS: 25.0000 mg | ORAL_TABLET | Freq: Every day | ORAL | Status: DC

## 2012-08-21 MED ORDER — DICLOFENAC SODIUM 1 % TD GEL: 2.0000 g | Freq: Four times a day (QID) | TRANSDERMAL | Status: DC

## 2012-08-21 NOTE — Progress Notes (Signed)
Chief Complaint  Patient presents with  . fasting med check    fasting med check, still having pain in shoulder after PT   HTN: Occasionally checks BP's and run 125-135/70-80. Denies dizziness or headaches, muscle cramps, edema. Compliant with medications.   Hyperlipidemia follow-up: Patient is reportedly following a low-fat, low cholesterol diet. Compliant with medications and denies medication side effects   Hypothyroidism: She denies any symptoms related to thyroid--denies fatigue, hair/skin/bowel changes; compliant with taking her medication.   Allergies:  Currently improved, had been pretty bad in April, worse when she was at the beach.  Impaired fasting glucose:  She cut back on eating desserts, eating more fruit instead.  She no longer has seconds.  She has lost 5 pounds since her CPE in November.  Right shoulder pain/deltoid tendinitis:  She has been getting PT.  Pain is much improved, but still has some persistent pain.  She has some pain at the top of the shoulder and sometimes slightly posterior, usually in the morning.  Hurts to put on bra.  Seems better if she sleeps on her back.  Some pain with ironing, occasionally.  She continues to do home exercise regimen.  No exacerbation since stopping PT 2 weeks ago.  She plans to start pilates and yoga.  She has Biofreeze but hasn't really used it much.  She has a list of questions--does she need DEXA (she reports she had one 3 years ago at GYN, and that it was normal), does she need EKG (had a baseline around age 35 at [facility], denies any symptoms).    Past Medical History  Diagnosis Date  . Hypertension   . Hyperlipidemia   . Hyperthyroidism   . Allergy     seasonal allergies  . IBS (irritable bowel syndrome) 1984  . Colon polyp 09/2000  . Macular degeneration 2008  . Basal cell carcinoma 2007    face-Dr.Drew Yetta Barre (now sees Dr. Lovenia Kim)  . Diverticulosis     seen on colonoscopy  . Breast cancer 1989    R  breast-microinvasive papillary(lumpectomy and XRT)  . Subdural hematoma 06/2008    Dr.Cram  . Elevated liver function tests 06/2009    galllstones and dilated CBD--s/p ERCP and chole  . Sullivan Lone syndrome    Past Surgical History  Procedure Laterality Date  . Abdominal hysterectomy      bladder repair  . Breast lumpectomy  1989    right breast  . Total knee arthroplasty  2000    right  . Total knee arthroplasty  2004    left knee  . Hammer toe surgery  05/2004    right foot  . Fracture left foot  01/2007    5th metatarsal-treated with boot  . Tubal ligation    . Ercp  07/2009    sphincerotomy  . Laparoscopic cholecystectomy  07/2009   History   Social History  . Marital Status: Married    Spouse Name: N/A    Number of Children: 2  . Years of Education: N/A   Occupational History  .     Social History Main Topics  . Smoking status: Former Smoker -- 0.10 packs/day for 3 years    Quit date: 03/27/1963  . Smokeless tobacco: Never Used  . Alcohol Use: Yes     Comment: 1 glass of wine per week.  . Drug Use: No  . Sexually Active: Not on file   Other Topics Concern  . Not on file   Social History Narrative  Retired Theme park manager.  1 daughter in [place] ([name]), and 1 daughter in [place])   Current outpatient prescriptions:aspirin 81 MG tablet, Take 81 mg by mouth daily., Disp: , Rfl: ;  atenolol (TENORMIN) 25 MG tablet, Take 1 tablet (25 mg total) by mouth daily., Disp: 90 tablet, Rfl: 1;  Calcium Carbonate-Vitamin D (CALTRATE 600+D) 600-400 MG-UNIT per tablet, Take 1 tablet by mouth 2 (two) times daily.  , Disp: , Rfl: ;  Cholecalciferol (VITAMIN D) 2000 UNITS tablet, Take 2,000 Units by mouth daily.  , Disp: , Rfl:  fexofenadine (ALLEGRA) 180 MG tablet, Take 180 mg by mouth daily.  , Disp: , Rfl: ;  fluticasone (FLONASE) 50 MCG/ACT nasal spray, Place 2 sprays into the nose daily., Disp: 16 g, Rfl: 11;  GLUCOSAMINE PO, Take 2,000 mg by mouth 2 (two) times daily.  , Disp:  , Rfl: ;  guaiFENesin (MUCINEX) 600 MG 12 hr tablet, Take 1,200 mg by mouth 2 (two) times daily., Disp: , Rfl:  levothyroxine (SYNTHROID, LEVOTHROID) 25 MCG tablet, TAKE 1 TABLET (25 MCG TOTAL) BY MOUTH DAILY., Disp: 90 tablet, Rfl: 0;  losartan-hydrochlorothiazide (HYZAAR) 100-25 MG per tablet, TAKE 1 TABLET BY MOUTH DAILY., Disp: 90 tablet, Rfl: 0;  Multiple Vitamins-Minerals (CENTRUM SILVER PO), Take 1 tablet by mouth daily.  , Disp: , Rfl:  Multiple Vitamins-Minerals (PRESERVISION/LUTEIN) CAPS, Take 1 capsule by mouth 2 (two) times daily.  , Disp: , Rfl: ;  simvastatin (ZOCOR) 20 MG tablet, Take 1 tablet (20 mg total) by mouth at bedtime., Disp: 90 tablet, Rfl: 1;  meloxicam (MOBIC) 7.5 MG tablet, Take 1 tablet (7.5 mg total) by mouth daily., Disp: 15 tablet, Rfl: 0  Allergies  Allergen Reactions  . Percocet (Oxycodone-Acetaminophen) Other (See Comments)    Upset stomach   ROS:  Denies headaches, dizziness, numbness, tingling, chest pain, shortness of breath.  +mild allergies, improved.  Moods were a little down when she was hurting a lot, improved now.  Denies abdominal pain, nausea, vomiting or bowel changes.  Denies bleeding/bruising, skin rash.  Denies any urinary complaints (all resolved after antibiotics in November).  PHYSICAL EXAM: BP 122/82  Pulse 72  Ht 5\' 5"  (1.651 m)  Wt 148 lb (67.132 kg)  BMI 24.63 kg/m2 Well developed, pleasant, talkative female in no distress  Neck: no lymphadenopathy, thyromegaly or bruit  Heart: regular rate and rhythm without murmur  Lungs: clear bilaterally  Abdomen: soft, nontender, no organomegaly or mass  Extremities: no edema, 2+ pulse.  Shoulders--FROM.  No focal tenderness or spasm.  No pain with strength testing, strength 5/5, no impingement. Skin: no rashes; Some dilated small veins in lower legs  Psych: talkative. Normal mood, affect, hygiene and grooming  ASSESSMENT/PLAN:  Essential hypertension, benign - Plan: Comprehensive metabolic  panel  Shoulder pain, right - Plan: diclofenac sodium (VOLTAREN) 1 % GEL  Deltoid tendinitis, right  Unspecified hypothyroidism - Plan: TSH, levothyroxine (SYNTHROID, LEVOTHROID) 25 MCG tablet  Mixed hyperlipidemia - Plan: Lipid panel, Comprehensive metabolic panel, simvastatin (ZOCOR) 20 MG tablet  Impaired fasting glucose - Plan: Comprehensive metabolic panel, Hemoglobin A1c  Allergic rhinitis, cause unspecified - Plan: fluticasone (FLONASE) 50 MCG/ACT nasal spray  Unspecified essential hypertension - Plan: atenolol (TENORMIN) 25 MG tablet  Shoulder pain/tendonitis--significantly improved, some focal area of pain, intermittent.  Trial of voltaren gel prn.  Continue home exercise program from PT.  Scheduled for colonoscopy with Dr. Ewing Schlein next week  F/u 6 months for CPE/med check, sooner prn

## 2012-08-21 NOTE — Patient Instructions (Addendum)
You can try the voltaren gel as needed for the shoulder pain.  You may also use tylenol arthritis as needed for pain. Listen to your body--if your new activities cause more pain, then back down, and start slower.  Continue all of your current medications.  We will refill the thyroid and cholesterol medication after your results of bloodwork is seen, and will call if any changes need to be made.

## 2012-08-22 ENCOUNTER — Encounter: Payer: Self-pay | Admitting: Family Medicine

## 2012-08-22 LAB — TSH: TSH: 1.173 u[IU]/mL (ref 0.350–4.500)

## 2012-08-22 MED ORDER — LEVOTHYROXINE SODIUM 25 MCG PO TABS: ORAL_TABLET | ORAL | Status: DC

## 2012-08-22 MED ORDER — SIMVASTATIN 20 MG PO TABS: 20.0000 mg | ORAL_TABLET | Freq: Every day | ORAL | Status: DC

## 2012-08-26 ENCOUNTER — Other Ambulatory Visit: Payer: Self-pay | Admitting: Gastroenterology

## 2012-11-03 ENCOUNTER — Other Ambulatory Visit: Payer: Self-pay | Admitting: Family Medicine

## 2012-11-03 DIAGNOSIS — Z1231 Encounter for screening mammogram for malignant neoplasm of breast: Secondary | ICD-10-CM

## 2012-11-12 ENCOUNTER — Other Ambulatory Visit: Payer: Self-pay | Admitting: Family Medicine

## 2012-12-03 ENCOUNTER — Ambulatory Visit (HOSPITAL_COMMUNITY)
Admission: RE | Admit: 2012-12-03 | Discharge: 2012-12-03 | Disposition: A | Discharge status: Home / Self Care | Payer: Medicare Other | Source: Ambulatory Visit | Attending: Family Medicine | Admitting: Family Medicine

## 2012-12-03 DIAGNOSIS — Z1231 Encounter for screening mammogram for malignant neoplasm of breast: Secondary | ICD-10-CM | POA: Insufficient documentation

## 2012-12-05 ENCOUNTER — Other Ambulatory Visit: Payer: Self-pay | Admitting: Family Medicine

## 2012-12-05 DIAGNOSIS — R928 Other abnormal and inconclusive findings on diagnostic imaging of breast: Secondary | ICD-10-CM

## 2012-12-08 ENCOUNTER — Encounter: Payer: Self-pay | Admitting: *Deleted

## 2012-12-18 ENCOUNTER — Other Ambulatory Visit: Payer: Medicare Other

## 2012-12-23 ENCOUNTER — Ambulatory Visit
Admission: RE | Admit: 2012-12-23 | Discharge: 2012-12-23 | Disposition: A | Discharge status: Home / Self Care | Payer: Medicare Other | Source: Ambulatory Visit | Attending: Family Medicine | Admitting: Family Medicine

## 2012-12-23 DIAGNOSIS — R928 Other abnormal and inconclusive findings on diagnostic imaging of breast: Secondary | ICD-10-CM

## 2013-02-23 ENCOUNTER — Other Ambulatory Visit: Payer: Self-pay | Admitting: Family Medicine

## 2013-02-26 ENCOUNTER — Ambulatory Visit (INDEPENDENT_AMBULATORY_CARE_PROVIDER_SITE_OTHER): Payer: Medicare Other | Admitting: Family Medicine

## 2013-02-26 ENCOUNTER — Encounter: Payer: Self-pay | Admitting: Family Medicine

## 2013-02-26 VITALS — BP 128/80 | HR 72 | Ht 65.0 in | Wt 151.0 lb

## 2013-02-26 DIAGNOSIS — M25519 Pain in unspecified shoulder: Secondary | ICD-10-CM

## 2013-02-26 DIAGNOSIS — E782 Mixed hyperlipidemia: Secondary | ICD-10-CM

## 2013-02-26 DIAGNOSIS — R8290 Unspecified abnormal findings in urine: Secondary | ICD-10-CM

## 2013-02-26 DIAGNOSIS — Z0000 Encounter for general adult medical examination without abnormal findings: Secondary | ICD-10-CM

## 2013-02-26 DIAGNOSIS — I10 Essential (primary) hypertension: Secondary | ICD-10-CM

## 2013-02-26 DIAGNOSIS — R82998 Other abnormal findings in urine: Secondary | ICD-10-CM

## 2013-02-26 DIAGNOSIS — R7301 Impaired fasting glucose: Secondary | ICD-10-CM

## 2013-02-26 DIAGNOSIS — Z01419 Encounter for gynecological examination (general) (routine) without abnormal findings: Secondary | ICD-10-CM

## 2013-02-26 DIAGNOSIS — J309 Allergic rhinitis, unspecified: Secondary | ICD-10-CM

## 2013-02-26 DIAGNOSIS — E039 Hypothyroidism, unspecified: Secondary | ICD-10-CM

## 2013-02-26 LAB — HEMOGLOBIN A1C
Hgb A1c MFr Bld: 5.9 % — ABNORMAL HIGH (ref <5.7)
Mean Plasma Glucose: 123 mg/dL — ABNORMAL HIGH (ref <117)

## 2013-02-26 LAB — LIPID PANEL
HDL: 57 mg/dL (ref >39)
LDL Cholesterol: 84 mg/dL (ref 0–99)
Total CHOL/HDL Ratio: 3.1 Ratio
VLDL: 34 mg/dL (ref 0–40)

## 2013-02-26 LAB — HEPATIC FUNCTION PANEL
ALT: 20 U/L (ref 0–35)
AST: 24 U/L (ref 0–37)
Albumin: 4.1 g/dL (ref 3.5–5.2)
Bilirubin, Direct: 0.2 mg/dL (ref 0.0–0.3)
Total Bilirubin: 1.6 mg/dL — ABNORMAL HIGH (ref 0.3–1.2)
Total Protein: 6 g/dL (ref 6.0–8.3)

## 2013-02-26 LAB — POCT URINALYSIS DIPSTICK
Bilirubin, UA: NEGATIVE
Ketones, UA: NEGATIVE
Protein, UA: NEGATIVE
Spec Grav, UA: 1.01
pH, UA: 5

## 2013-02-26 MED ORDER — SIMVASTATIN 20 MG PO TABS: 20.0000 mg | ORAL_TABLET | Freq: Every day | ORAL | Status: DC

## 2013-02-26 NOTE — Patient Instructions (Signed)

## 2013-02-26 NOTE — Progress Notes (Signed)
Chief Complaint  Patient presents with  . Annual Exam    annual exam with pelvic or pap-unsure of which.Did not do eye exam as she just had one. UA showed 2+ leuks, 1+ blood and positive nitrite, only complaint is urinating at night. Does want to discuss her shoulder issues-not completely resolved. Not pain all the time but randomly at times. All meds reconciled.   Courtney Castro is a 79 y.o. female who presents for a complete physical.  She is also here to follow up on her chronic medical problems.  HTN: Hasn't been checking her blood pressure. Denies dizziness or headaches, muscle cramps, edema. Compliant with medications.   Hyperlipidemia follow-up: Patient is reportedly following a low-fat, low cholesterol diet. Compliant with medications and denies medication side effects   Hypothyroidism: She denies any symptoms related to thyroid (reviewed in detail); compliant with taking her medication. Last TSH was normal in May.   Allergies: flared earlier in the fall and summer, but doing well now on her current regimen.  Occasional sneeze.  Abnormal urine dip--she had UTI last year, but was symptomatic then with incontinence.  She does have some mild dysuria with voiding, but intermittent. Denies any incontinence, denies hematuria or odor to urine.  The shoulder pain that she developed after doing weights at the [facility] is significantly improved.  Only sporadically has discomfort.  She is afraid to restart weight-bearing activity in upper extremities for fear or recurring pain.  She occasionally has soreness after a lot of upper body activity (yardwork, etc).   Immunization History  Administered Date(s) Administered  . Influenza Split 12/11/2009, 01/02/2011, 12/21/2011  . Influenza, High Dose Seasonal PF 12/05/2012  . Pneumococcal Polysaccharide-23 11/07/2001, 01/31/2011  . Td 12/18/2004  . Tdap 01/31/2011  . Zoster 08/25/2007   Last Pap smear: 2011, s/p hysterectomy for benign reasons  Last  mammogram: 11/2012  Last colonoscopy: 2007 [facility]) and again at June 2014 with Dr. Ewing Schlein. Normal per pt, repeat in 5 years Last DEXA: 10/09 (normal; at [facility])  Dentist: twice yearly  Ophtho: twice yearly  Exercise: walking 3-4 days/week and plans to start pilates.  She has some resistance bands, but isn't using them regularly.  Past Medical History  Diagnosis Date  . Hypertension   . Hyperlipidemia   . Hyperthyroidism   . Allergy     seasonal allergies  . IBS (irritable bowel syndrome) 1984  . Colon polyp 09/2000  . Macular degeneration 2008    mild  . Basal cell carcinoma 2007    face-Dr.Drew Yetta Barre (now sees Dr. Lovenia Kim)  . Diverticulosis     seen on colonoscopy  . Breast cancer 1989    R breast-microinvasive papillary(lumpectomy and XRT)  . Subdural hematoma 06/2008    Dr.Cram  . Elevated liver function tests 06/2009    galllstones and dilated CBD--s/p ERCP and chole  . Sullivan Lone syndrome     Past Surgical History  Procedure Laterality Date  . Abdominal hysterectomy      bladder repair  . Breast lumpectomy  1989    right breast  . Total knee arthroplasty Right 2000  . Total knee arthroplasty Left 2004    Dr. Lequita Halt  . Hammer toe surgery  05/2004    right foot  . Fracture left foot  01/2007    5th metatarsal-treated with boot  . Tubal ligation    . Ercp  07/2009    sphincterotomy  . Laparoscopic cholecystectomy  07/2009    History  Social History  . Marital Status: Married    Spouse Name: N/A    Number of Children: 2  . Years of Education: N/A   Occupational History  .     Social History Main Topics  . Smoking status: Former Smoker -- 0.10 packs/day for 3 years    Quit date: 03/27/1963  . Smokeless tobacco: Never Used  . Alcohol Use: Yes     Comment: 1 glass of wine per week.  . Drug Use: No  . Sexual Activity: Yes    Partners: Male   Other Topics Concern  . Not on file   Social History Narrative   Retired Sales executive.  1 daughter in [place] ([name]), and 1 daughter in [place]). 4 granddaughters    Family History  Problem Relation Age of Onset  . Leukemia Mother   . Ovarian cancer Mother     ??? hysterectomy age 59's  . Cancer Mother     leukemia; possible ovarian cancer (hysterectomy in her 36's)  . Coronary artery disease Father   . Hypertension Father   . Heart disease Father     onset late 24's  . Coronary artery disease Brother 60    coronary artery bypass grafting, pacemaker  . Heart disease Brother     CABG in 60's, pacemaker  . Breast cancer Maternal Aunt   . Cancer Maternal Aunt     breast  . Diabetes Paternal Uncle   . Diabetes Paternal Grandmother   . Cancer Cousin     breast  . Breast cancer Cousin     Current outpatient prescriptions:aspirin 81 MG tablet, Take 81 mg by mouth daily., Disp: , Rfl: ;  atenolol (TENORMIN) 25 MG tablet, TAKE 1 TABLET (25 MG TOTAL) BY MOUTH DAILY., Disp: 90 tablet, Rfl: 0;  Calcium Carbonate-Vitamin D (CALTRATE 600+D) 600-400 MG-UNIT per tablet, Take 1 tablet by mouth 2 (two) times daily.  , Disp: , Rfl: ;  Cholecalciferol (VITAMIN D) 2000 UNITS tablet, Take 2,000 Units by mouth daily.  , Disp: , Rfl:  fexofenadine (ALLEGRA) 180 MG tablet, Take 180 mg by mouth daily.  , Disp: , Rfl: ;  fluticasone (FLONASE) 50 MCG/ACT nasal spray, Place 2 sprays into the nose daily., Disp: 16 g, Rfl: 11;  GLUCOSAMINE PO, Take 2,000 mg by mouth 2 (two) times daily.  , Disp: , Rfl: ;  levothyroxine (SYNTHROID, LEVOTHROID) 25 MCG tablet, TAKE 1 TABLET (25 MCG TOTAL) BY MOUTH DAILY., Disp: 90 tablet, Rfl: 3 losartan-hydrochlorothiazide (HYZAAR) 100-25 MG per tablet, TAKE 1 TABLET BY MOUTH DAILY., Disp: 90 tablet, Rfl: 1;  Multiple Vitamins-Minerals (CENTRUM SILVER PO), Take 1 tablet by mouth daily.  , Disp: , Rfl: ;  Multiple Vitamins-Minerals (PRESERVISION/LUTEIN) CAPS, Take 1 capsule by mouth 2 (two) times daily.  , Disp: , Rfl: ;  simvastatin (ZOCOR) 20 MG tablet, Take 1  tablet (20 mg total) by mouth at bedtime., Disp: 90 tablet, Rfl: 1 diclofenac sodium (VOLTAREN) 1 % GEL, Apply 2 g topically 4 (four) times daily. Use as needed for shoulder pain, Disp: 100 g, Rfl: 1;  guaiFENesin (MUCINEX) 600 MG 12 hr tablet, Take 1,200 mg by mouth 2 (two) times daily., Disp: , Rfl:   Allergies  Allergen Reactions  . Percocet [Oxycodone-Acetaminophen] Other (See Comments)    Upset stomach   ROS: The patient denies anorexia, fever, weight changes, headaches, vision changes, decreased hearing, ear pain, sore throat, breast concerns, chest pain, palpitations, dizziness, syncope, dyspnea on exertion, swelling,  nausea, vomiting, diarrhea, constipation, abdominal pain, melena, hematochezia, indigestion/heartburn, hematuria, vaginal bleeding, genital lesions, joint pains (very mild in shoulders, overall much improved), numbness, tingling, weakness, tremor, suspicious skin lesions, depression, anxiety, abnormal bleeding/bruising, or enlarged lymph nodes.  Very slight vaginal discharge, intermittent.  Some very mild, short-lived, intermittent dysuria at onset of void.  PHYSICAL EXAM: BP 128/80  Pulse 72  Ht 5\' 5"  (1.651 m)  Wt 151 lb (68.493 kg)  BMI 25.13 kg/m2  General Appearance:  Alert, cooperative, no distress, appears stated age   Head:  Normocephalic, without obvious abnormality, atraumatic   Eyes:  PERRL, conjunctiva/corneas clear, EOM's intact, fundi  benign   Ears:  Normal TM's and external ear canals   Nose:  Nares normal, mucosa normal, sinuses nontender   Throat:  Lips, mucosa, and tongue normal; teeth and gums normal   Neck:  Supple, no lymphadenopathy; thyroid: no enlargement/tenderness/nodules; no carotid  bruit or JVD   Back:  Spine nontender, no curvature, ROM normal, no CVA Tenderness.   Lungs:  Clear to auscultation bilaterally without wheezes, rales or ronchi; respirations unlabored   Chest Wall:  No tenderness or deformity   Heart:  Regular rate and  rhythm, S1 and S2 normal, no murmur, rub  or gallop   Breast Exam:  No tenderness, masses, or nipple discharge or inversion. No axillary lymphadenopathy. WHSS R breast at 9 o'clock. Left breast larger than right.   Abdomen:  Soft, non-tender, nondistended, normoactive bowel sounds,  no masses, no hepatosplenomegaly   Genitalia:  Normal external genitalia without lesions. BUS and vagina normal; Adnexa not palpable. Uterus surgically absent. No masses or tenderness. Pap not performed   Rectal:  Normal tone, no masses or tenderness; guaiac negative stool   Extremities:  No clubbing, cyanosis or edema   Pulses:  2+ and symmetric all extremities   Skin:  Skin color, texture, turgor normal, no rashes or lesions. Many seborrheic keratosis throughout, especially on chest/abdomen  Lymph nodes:  Cervical, supraclavicular, and axillary nodes normal   Neurologic:  CNII-XII intact, normal strength, sensation and gait; reflexes 2+ and symmetric throughout         Psych: Normal mood, affect, hygiene and grooming.   ASSESSMENT/PLAN:  Routine general medical examination at a health care facility - Plan: POCT Urinalysis Dipstick  Unspecified hypothyroidism - euthyroid by history.  last TSH 6 mos ago normal.  check just yearly, sooner prn if symptoms develop  Mixed hyperlipidemia - Plan: Lipid panel, Hepatic function panel, simvastatin (ZOCOR) 20 MG tablet  Impaired fasting glucose - Plan: Hemoglobin A1c, Glucose, random  Essential hypertension, benign - well controlled  Allergic rhinitis, cause unspecified - controlled  Abnormal urinalysis - Plan: Urine culture  Shoulder pain, unspecified laterality - significantly improved, mainly resolved with occasional soreness with certain activities.  reassured   Discussed monthly self breast exams and yearly mammograms; at least 30 minutes of aerobic activity at least 5 days/week; proper sunscreen use reviewed; healthy diet, including goals of calcium and  vitamin D intake and alcohol recommendations (less than or equal to 1 drink/day) reviewed; regular seatbelt use; changing batteries in smoke detectors. Immunization recommendations discussed--UTD. Flu shots annually. Colonoscopy UTD  Patient has living will, healthcare power of attorney.  F/u 6 mos, sooner prn

## 2013-02-27 ENCOUNTER — Encounter: Payer: Self-pay | Admitting: Family Medicine

## 2013-02-28 LAB — URINE CULTURE: Colony Count: >=100000

## 2013-03-02 ENCOUNTER — Other Ambulatory Visit: Payer: Self-pay | Admitting: *Deleted

## 2013-03-02 DIAGNOSIS — N390 Urinary tract infection, site not specified: Secondary | ICD-10-CM

## 2013-03-02 MED ORDER — NITROFURANTOIN MONOHYD MACRO 100 MG PO CAPS: 100.0000 mg | ORAL_CAPSULE | Freq: Two times a day (BID) | ORAL | Status: DC

## 2013-04-16 ENCOUNTER — Encounter: Payer: Medicare Other | Admitting: Family Medicine

## 2013-05-11 ENCOUNTER — Other Ambulatory Visit: Payer: Self-pay | Admitting: Family Medicine

## 2013-08-24 ENCOUNTER — Telehealth: Payer: Self-pay | Admitting: Family Medicine

## 2013-08-24 NOTE — Telephone Encounter (Signed)
Patient using Flonase and allegra. She will try some sinus rinses and come in Thursday if not better.

## 2013-08-24 NOTE — Telephone Encounter (Signed)
Flonase is on her med list--has she been using this? If not, she should restart (ok to refill, vs using OTC).  She can also try using Neti-pot or doing sinus rinses with rinse kit.  It is possible that it could be a virus, in which case, symptoms should begin to improve in the next 3-5 days.  If getting worse, discolored mucus, or worsening cough or other symptoms, schedule OV

## 2013-08-26 ENCOUNTER — Ambulatory Visit (INDEPENDENT_AMBULATORY_CARE_PROVIDER_SITE_OTHER): Payer: Medicare Other | Admitting: Family Medicine

## 2013-08-26 ENCOUNTER — Encounter: Payer: Self-pay | Admitting: Family Medicine

## 2013-08-26 VITALS — BP 132/80 | HR 68 | Temp 98.1°F | Ht 65.0 in | Wt 149.0 lb

## 2013-08-26 DIAGNOSIS — J309 Allergic rhinitis, unspecified: Secondary | ICD-10-CM

## 2013-08-26 DIAGNOSIS — J019 Acute sinusitis, unspecified: Secondary | ICD-10-CM

## 2013-08-26 MED ORDER — AZITHROMYCIN 250 MG PO TABS: ORAL_TABLET | ORAL | Status: DC

## 2013-08-26 NOTE — Patient Instructions (Signed)
Drink plenty of fluids. Start the antibiotics today. Continue to take guaifenesin (either in Robitussin DM OR Mucinex, do not take both). If you are taking plain Mucinex, you can take Delsym syrup as a cough suppressant as needed (vs getting Mucinex DM, or using the Robitussin DM more frequently). Consider doing sinus rinses. Okay to continue Allegra, if you are still having any allergy symptoms (runny nose, itchy eyes, etc)

## 2013-08-26 NOTE — Progress Notes (Signed)
Chief Complaint  Patient presents with  . Cough    and sinus drainage. Mucus is yellow in color, unsure if she has had fever. This has been going on a couple of weeks. (did not do sinus rinses as suggested)   She has been having runny nose and congestion for about 2 weeks; allergies have been worse this year than usual.  She has been noticing more cough (especially when trying to blow the left side of her nostril).  The nasal drainage had been clear, but the phlegm she coughed up was yellow-green.  The cough seems to be bothering her more.  She has used Robitussin DM which has helped some.  She didn't take Allegra for a couple of days last week, just took Mucinex.  She is back to taking the Allegra, and has taken the Mucinex sporadically--really made her cough more, but able to get a lot of (discolored) mucus up.  She used Robitussin DM also.  Past Medical History  Diagnosis Date  . Hypertension   . Hyperlipidemia   . Hyperthyroidism   . Allergy     seasonal allergies  . IBS (irritable bowel syndrome) 1984  . Colon polyp 09/2000  . Macular degeneration 2008    mild  . Basal cell carcinoma 2007    face-Dr.Drew Ronnald Ramp (now sees Dr. Derrel Nip)  . Diverticulosis     seen on colonoscopy  . Breast cancer 1989    R breast-microinvasive papillary(lumpectomy and XRT)  . Subdural hematoma 06/2008    Dr.Cram  . Elevated liver function tests 06/2009    galllstones and dilated CBD--s/p ERCP and chole  . Rosanna Randy syndrome    Past Surgical History  Procedure Laterality Date  . Abdominal hysterectomy      bladder repair  . Breast lumpectomy  1989    right breast  . Total knee arthroplasty Right 2000  . Total knee arthroplasty Left 2004    Dr. Wynelle Link  . Hammer toe surgery  05/2004    right foot  . Fracture left foot  01/2007    5th metatarsal-treated with boot  . Tubal ligation    . Ercp  07/2009    sphincterotomy  . Laparoscopic cholecystectomy  07/2009   History   Social History  .  Marital Status: Married    Spouse Name: N/A    Number of Children: 2  . Years of Education: N/A   Occupational History  .     Social History Main Topics  . Smoking status: Former Smoker -- 0.10 packs/day for 3 years    Quit date: 03/27/1963  . Smokeless tobacco: Never Used  . Alcohol Use: Yes     Comment: 1 glass of wine per week.  . Drug Use: No  . Sexual Activity: Yes    Partners: Male   Other Topics Concern  . Not on file   Social History Narrative   Retired Statistician.  1 daughter in [place] ([name]), and 1 daughter in [place]). 4 granddaughters   Outpatient Encounter Prescriptions as of 08/26/2013  Medication Sig  . aspirin 81 MG tablet Take 81 mg by mouth daily.  Marland Kitchen atenolol (TENORMIN) 25 MG tablet TAKE 1 TABLET (25 MG TOTAL) BY MOUTH DAILY.  . Calcium Carbonate-Vitamin D (CALTRATE 600+D) 600-400 MG-UNIT per tablet Take 1 tablet by mouth 2 (two) times daily.    . Cholecalciferol (VITAMIN D) 2000 UNITS tablet Take 2,000 Units by mouth daily.    Marland Kitchen Dextromethorphan-Guaifenesin (ROBITUSSIN DM PO) Take  10 mLs by mouth every 4 (four) hours as needed.  . fexofenadine (ALLEGRA) 180 MG tablet Take 180 mg by mouth daily.    . fluticasone (FLONASE) 50 MCG/ACT nasal spray Place 2 sprays into the nose daily.  Marland Kitchen GLUCOSAMINE PO Take 2,000 mg by mouth 2 (two) times daily.    Marland Kitchen guaiFENesin (MUCINEX) 600 MG 12 hr tablet Take 1,200 mg by mouth 2 (two) times daily.  Marland Kitchen levothyroxine (SYNTHROID, LEVOTHROID) 25 MCG tablet TAKE 1 TABLET (25 MCG TOTAL) BY MOUTH DAILY.  Marland Kitchen losartan-hydrochlorothiazide (HYZAAR) 100-25 MG per tablet TAKE 1 TABLET BY MOUTH DAILY.  . Multiple Vitamins-Minerals (CENTRUM SILVER PO) Take 1 tablet by mouth daily.    . Multiple Vitamins-Minerals (PRESERVISION/LUTEIN) CAPS Take 1 capsule by mouth 2 (two) times daily.    . simvastatin (ZOCOR) 20 MG tablet Take 1 tablet (20 mg total) by mouth at bedtime.  Marland Kitchen azithromycin (ZITHROMAX) 250 MG tablet Take 2 tablets by mouth on  first day, then 1 tablet by mouth on days 2 through 5  . [DISCONTINUED] diclofenac sodium (VOLTAREN) 1 % GEL Apply 2 g topically 4 (four) times daily. Use as needed for shoulder pain  . [DISCONTINUED] nitrofurantoin, macrocrystal-monohydrate, (MACROBID) 100 MG capsule Take 1 capsule (100 mg total) by mouth 2 (two) times daily.   Allergies  Allergen Reactions  . Percocet [Oxycodone-Acetaminophen] Other (See Comments)    Upset stomach   ROS:  Denies fevers, chills, shortness of breath.  Stomach has felt "unsettled"--no nausea or vomiting, no diarrhea.  Slight leakage of urine with coughing. No bleeding, bruising, rashes  PHYSICAL EXAM: BP 132/80  Pulse 68  Temp(Src) 98.1 F (36.7 C) (Oral)  Ht 5\' 5"  (1.651 m)  Wt 149 lb (67.586 kg)  BMI 24.79 kg/m2 Well developed, pleasant female, with only rare cough HEENT:  PERRL, EOMI, conjunctiva clear.  TM's and EAC's normal. R nare is normal, no edema or drainage. L nare is moderately edematous with some erythema and yellow mucus. Sinuses are nontender.  OP is clear Neck: no lymphadenopathy, thyromegaly or mass Heart: regular rate and rhythm Lungs clear bilaterally Skin: no rash/lesions Neuro: alert and oriented, cranial nerves intact, normal strength, gait Psych: normal mood, affect   ASSESSMENT/PLAN:  Acute sinusitis - supportive measures reviewed--sinus rinses, guaifenesin (do not take both Mucinex and Robitussin). Z-pak.  f/u if not improving - Plan: azithromycin (ZITHROMAX) 250 MG tablet  Allergic rhinitis, cause unspecified

## 2013-08-27 ENCOUNTER — Ambulatory Visit: Payer: Self-pay | Admitting: Family Medicine

## 2013-09-03 ENCOUNTER — Ambulatory Visit (INDEPENDENT_AMBULATORY_CARE_PROVIDER_SITE_OTHER): Payer: Medicare Other | Admitting: Family Medicine

## 2013-09-03 ENCOUNTER — Encounter: Payer: Self-pay | Admitting: Family Medicine

## 2013-09-03 VITALS — BP 118/78 | HR 64 | Ht 65.0 in | Wt 149.0 lb

## 2013-09-03 DIAGNOSIS — J309 Allergic rhinitis, unspecified: Secondary | ICD-10-CM

## 2013-09-03 DIAGNOSIS — Z79899 Other long term (current) drug therapy: Secondary | ICD-10-CM

## 2013-09-03 DIAGNOSIS — E039 Hypothyroidism, unspecified: Secondary | ICD-10-CM

## 2013-09-03 DIAGNOSIS — R7301 Impaired fasting glucose: Secondary | ICD-10-CM

## 2013-09-03 DIAGNOSIS — R5383 Other fatigue: Secondary | ICD-10-CM

## 2013-09-03 DIAGNOSIS — E782 Mixed hyperlipidemia: Secondary | ICD-10-CM

## 2013-09-03 DIAGNOSIS — I10 Essential (primary) hypertension: Secondary | ICD-10-CM

## 2013-09-03 DIAGNOSIS — R5381 Other malaise: Secondary | ICD-10-CM

## 2013-09-03 LAB — CBC WITH DIFFERENTIAL/PLATELET
BASOS ABS: 0.1 10*3/uL (ref 0.0–0.1)
Basophils Relative: 1 % (ref 0–1)
Eosinophils Absolute: 0.2 10*3/uL (ref 0.0–0.7)
Eosinophils Relative: 3 % (ref 0–5)
HCT: 43.5 % (ref 36.0–46.0)
Hemoglobin: 15 g/dL (ref 12.0–15.0)
LYMPHS PCT: 17 % (ref 12–46)
Lymphs Abs: 1.3 10*3/uL (ref 0.7–4.0)
MCH: 29.1 pg (ref 26.0–34.0)
MCHC: 34.5 g/dL (ref 30.0–36.0)
MCV: 84.5 fL (ref 78.0–100.0)
MONO ABS: 0.7 10*3/uL (ref 0.1–1.0)
Monocytes Relative: 10 % (ref 3–12)
Neutro Abs: 5.1 10*3/uL (ref 1.7–7.7)
Neutrophils Relative %: 69 % (ref 43–77)
Platelets: 266 10*3/uL (ref 150–400)
RBC: 5.15 MIL/uL — ABNORMAL HIGH (ref 3.87–5.11)
RDW: 13.3 % (ref 11.5–15.5)
WBC: 7.4 10*3/uL (ref 4.0–10.5)

## 2013-09-03 MED ORDER — SIMVASTATIN 20 MG PO TABS: 20.0000 mg | ORAL_TABLET | Freq: Every day | ORAL | Status: DC

## 2013-09-03 NOTE — Patient Instructions (Signed)
Restart the Flonase. Continue your current medications. We will refill the thyroid medication after your lab results are seen tomorrow.
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
## 2013-09-04 ENCOUNTER — Encounter: Payer: Self-pay | Admitting: Family Medicine

## 2013-09-04 LAB — COMPREHENSIVE METABOLIC PANEL
ALBUMIN: 4.3 g/dL (ref 3.5–5.2)
ALT: 17 U/L (ref 0–35)
AST: 25 U/L (ref 0–37)
Alkaline Phosphatase: 71 U/L (ref 39–117)
BUN: 20 mg/dL (ref 6–23)
CO2: 26 mEq/L (ref 19–32)
Calcium: 10 mg/dL (ref 8.4–10.5)
Chloride: 102 mEq/L (ref 96–112)
Creat: 0.74 mg/dL (ref 0.50–1.10)
GLUCOSE: 94 mg/dL (ref 70–99)
POTASSIUM: 3.9 meq/L (ref 3.5–5.3)
Sodium: 140 mEq/L (ref 135–145)
Total Bilirubin: 1.7 mg/dL — ABNORMAL HIGH (ref 0.2–1.2)
Total Protein: 6.2 g/dL (ref 6.0–8.3)

## 2013-09-04 LAB — LIPID PANEL
CHOLESTEROL: 163 mg/dL (ref 0–200)
HDL: 51 mg/dL (ref >39)
LDL Cholesterol: 84 mg/dL (ref 0–99)
Total CHOL/HDL Ratio: 3.2 Ratio
Triglycerides: 141 mg/dL (ref <150)
VLDL: 28 mg/dL (ref 0–40)

## 2013-09-04 LAB — HEMOGLOBIN A1C
Hgb A1c MFr Bld: 6 % — ABNORMAL HIGH (ref <5.7)
MEAN PLASMA GLUCOSE: 126 mg/dL — AB (ref <117)

## 2013-09-04 LAB — TSH: TSH: 1.067 u[IU]/mL (ref 0.350–4.500)

## 2013-09-04 MED ORDER — LEVOTHYROXINE SODIUM 25 MCG PO TABS: ORAL_TABLET | ORAL | Status: DC

## 2013-10-14 ENCOUNTER — Other Ambulatory Visit: Payer: Self-pay | Admitting: Family Medicine

## 2013-11-09 ENCOUNTER — Other Ambulatory Visit: Payer: Self-pay | Admitting: Family Medicine

## 2013-11-18 ENCOUNTER — Ambulatory Visit (INDEPENDENT_AMBULATORY_CARE_PROVIDER_SITE_OTHER): Payer: Medicare Other | Admitting: Family Medicine

## 2013-11-18 ENCOUNTER — Encounter: Payer: Self-pay | Admitting: Family Medicine

## 2013-11-18 VITALS — BP 124/80 | HR 60 | Temp 97.6°F | Ht 65.0 in | Wt 148.0 lb

## 2013-11-18 DIAGNOSIS — IMO0001 Reserved for inherently not codable concepts without codable children: Secondary | ICD-10-CM

## 2013-11-18 DIAGNOSIS — L03019 Cellulitis of unspecified finger: Secondary | ICD-10-CM

## 2013-11-18 MED ORDER — CEPHALEXIN 500 MG PO CAPS: 500.0000 mg | ORAL_CAPSULE | Freq: Four times a day (QID) | ORAL | Status: DC

## 2013-11-18 NOTE — Progress Notes (Signed)
Chief Complaint  Patient presents with  . Wound Infection    right index finger infected x 4-5 days. Patient does state that she gets manicures every 2 weeks.    4-5 days ago she started noticing some redness and pain at the base of the right index fingernail.  She has been putting polysporin and a bandage over it, and treated it with peroxide.  This morning she noticed stuff "oozing out" from the side of the nail.  It has felt a little better since draining.  It has been throbbing.  She gets manicures every 2 weeks, last was 8/13.  Past Medical History  Diagnosis Date  . Hypertension   . Hyperlipidemia   . Hyperthyroidism   . Allergy     seasonal allergies  . IBS (irritable bowel syndrome) 1984  . Colon polyp 09/2000  . Macular degeneration 2008    mild  . Basal cell carcinoma 2007    face-Dr.Drew Ronnald Castro (now sees Dr. Derrel Castro)  . Diverticulosis     seen on colonoscopy  . Breast cancer 1989    R breast-microinvasive papillary(lumpectomy and XRT)  . Subdural hematoma 06/2008    Dr.Cram  . Elevated liver function tests 06/2009    galllstones and dilated CBD--s/p ERCP and chole  . Rosanna Randy syndrome    Past Surgical History  Procedure Laterality Date  . Abdominal hysterectomy      bladder repair  . Breast lumpectomy  1989    right breast  . Total knee arthroplasty Right 2000  . Total knee arthroplasty Left 2004    Dr. Wynelle Castro  . Hammer toe surgery  05/2004    right foot  . Fracture left foot  01/2007    5th metatarsal-treated with boot  . Tubal ligation    . Ercp  07/2009    sphincterotomy  . Laparoscopic cholecystectomy  07/2009    History   Social History  . Marital Status: Married    Spouse Name: N/A    Number of Children: 2  . Years of Education: N/A   Occupational History  .     Social History Main Topics  . Smoking status: Former Smoker -- 0.10 packs/day for 3 years    Quit date: 03/27/1963  . Smokeless tobacco: Never Used  . Alcohol Use: Yes   Comment: 1 glass of wine per week.  . Drug Use: No  . Sexual Activity: Yes    Partners: Male   Other Topics Concern  . Not on file   Social History Narrative   Retired Statistician.  1 daughter in [place] (Courtney Castro), and 1 daughter in [place]). 4 granddaughters   Current Outpatient Prescriptions on File Prior to Visit  Medication Sig Dispense Refill  . aspirin 81 MG tablet Take 81 mg by mouth daily.      Marland Kitchen atenolol (TENORMIN) 25 MG tablet TAKE 1 TABLET (25 MG TOTAL) BY MOUTH DAILY.  90 tablet  0  . Calcium Carbonate-Vitamin D (CALTRATE 600+D) 600-400 MG-UNIT per tablet Take 1 tablet by mouth 2 (two) times daily.        . Cholecalciferol (VITAMIN D) 2000 UNITS tablet Take 2,000 Units by mouth daily.        . fexofenadine (ALLEGRA) 180 MG tablet Take 180 mg by mouth daily.        . fluticasone (FLONASE) 50 MCG/ACT nasal spray PLACE 2 SPRAYS INTO THE NOSE DAILY.  16 g  5  . GLUCOSAMINE PO Take 2,000 mg by mouth  2 (two) times daily.        Marland Kitchen levothyroxine (SYNTHROID, LEVOTHROID) 25 MCG tablet TAKE 1 TABLET (25 MCG TOTAL) BY MOUTH DAILY.  90 tablet  3  . losartan-hydrochlorothiazide (HYZAAR) 100-25 MG per tablet TAKE 1 TABLET BY MOUTH DAILY.  90 tablet  0  . Multiple Vitamins-Minerals (CENTRUM SILVER PO) Take 1 tablet by mouth daily.        . Multiple Vitamins-Minerals (PRESERVISION/LUTEIN) CAPS Take 1 capsule by mouth 2 (two) times daily.        . simvastatin (ZOCOR) 20 MG tablet Take 1 tablet (20 mg total) by mouth at bedtime.  90 tablet  1   No current facility-administered medications on file prior to visit.   Allergies  Allergen Reactions  . Percocet [Oxycodone-Acetaminophen] Other (See Comments)    Upset stomach   ROS:  She denies any fevers, chills, nausea, vomiting. Denies other rashes, lesions, bleeding, bruising. No URI symptoms ,cough, shortness of breath, edema or other concerns.   PHYSICAL EXAM: BP 124/80  Pulse 60  Temp(Src) 97.6 F (36.4 C) (Tympanic)  Ht 5\' 5"   (1.651 m)  Wt 148 lb (67.132 kg)  BMI 24.63 kg/m2 Pleasant female in no distress  Right index finger:  She has some arthritic swelling in the DIP joint.  At the base of the nail there is soft tissue swelling and erythema.  There is some fluctuance and slight drainage of whitish yellow material at the radial aspect of the nail.  There is no streaking or spreading of the erythema more proximal on the finger, just overlying the soft tissue swelling.  ASSESSMENT/PLAN:  Paronychia of second finger, right - Plan: cephALEXin (KEFLEX) 500 MG capsule   Paronychia, spontaneously draining today.  Start warm soaks, at least 10-15 minutes 3-4 times daily, to keep the area draining. If it stops draining, and swelling increasing, with increased pain, you may need to return to have the area lanced.  It is appears that it is currently draining on its own, so hopefully this will continue.  Return if you develop high fevers, spreading redness down the finger, especially if any streaks form.

## 2013-11-18 NOTE — Patient Instructions (Signed)
Paronychia Paronychia is an inflammatory reaction involving the folds of the skin surrounding the fingernail. This is commonly caused by an infection in the skin around a nail. The most common cause of paronychia is frequent wetting of the hands (as seen with bartenders, food servers, nurses or others who wet their hands). This makes the skin around the fingernail susceptible to infection by bacteria (germs) or fungus. Other predisposing factors are:  Aggressive manicuring.  Nail biting.  Thumb sucking. The most common cause is a staphylococcal (a type of germ) infection, or a fungal (Candida) infection. When caused by a germ, it usually comes on suddenly with redness, swelling, pus and is often painful. It may get under the nail and form an abscess (collection of pus), or form an abscess around the nail. If the nail itself is infected with a fungus, the treatment is usually prolonged and may require oral medicine for up to one year. Your caregiver will determine the length of time treatment is required. The paronychia caused by bacteria (germs) may largely be avoided by not pulling on hangnails or picking at cuticles. When the infection occurs at the tips of the finger it is called felon. When the cause of paronychia is from the herpes simplex virus (HSV) it is called herpetic whitlow. TREATMENT  When an abscess is present treatment is often incision and drainage. This means that the abscess must be cut open so the pus can get out. When this is done, the following home care instructions should be followed. HOME CARE INSTRUCTIONS   It is important to keep the affected fingers very dry. Rubber or plastic gloves over cotton gloves should be used whenever the hand must be placed in water.  Keep wound clean, dry and dressed as suggested by your caregiver between warm soaks or warm compresses.  Soak in warm water for fifteen to twenty minutes three to four times per day for bacterial infections. Fungal  infections are very difficult to treat, so often require treatment for long periods of time.  For bacterial (germ) infections take antibiotics (medicine which kill germs) as directed and finish the prescription, even if the problem appears to be solved before the medicine is gone.  Only take over-the-counter or prescription medicines for pain, discomfort, or fever as directed by your caregiver. SEEK IMMEDIATE MEDICAL CARE IF:  You have redness, swelling, or increasing pain in the wound.  You notice pus coming from the wound.  You have a fever.  You notice a bad smell coming from the wound or dressing. Document Released: 09/05/2000 Document Revised: 06/04/2011 Document Reviewed: 05/07/2008 [facility] Patient Information 2015 [place], [place]. This information is not intended to replace advice given to you by your health care provider. Make sure you discuss any questions you have with your health care provider.   Start warm soaks, at least 10-15 minutes 3-4 times daily, to keep the area draining. If it stops draining, and swelling increasing, with increased pain, you may need to return to have the area lanced.  It is appears that it is currently draining on its own, so hopefully this will continue.  Return if you develop high fevers, spreading redness down the finger, especially if any streaks form.

## 2013-11-23 ENCOUNTER — Other Ambulatory Visit: Payer: Self-pay | Admitting: Family Medicine

## 2013-11-23 DIAGNOSIS — Z1231 Encounter for screening mammogram for malignant neoplasm of breast: Secondary | ICD-10-CM

## 2013-12-02 ENCOUNTER — Other Ambulatory Visit: Payer: Self-pay

## 2013-12-06 ENCOUNTER — Encounter: Payer: Self-pay | Admitting: Family Medicine

## 2013-12-17 ENCOUNTER — Encounter: Payer: Self-pay | Admitting: Internal Medicine

## 2013-12-28 ENCOUNTER — Ambulatory Visit (HOSPITAL_COMMUNITY)
Admission: RE | Admit: 2013-12-28 | Discharge: 2013-12-28 | Disposition: A | Discharge status: Home / Self Care | Payer: Medicare Other | Source: Ambulatory Visit | Attending: Family Medicine | Admitting: Family Medicine

## 2013-12-28 DIAGNOSIS — Z1231 Encounter for screening mammogram for malignant neoplasm of breast: Secondary | ICD-10-CM | POA: Insufficient documentation

## 2014-01-25 ENCOUNTER — Encounter: Payer: Self-pay | Admitting: Family Medicine

## 2014-02-08 ENCOUNTER — Other Ambulatory Visit: Payer: Self-pay | Admitting: Family Medicine

## 2014-02-23 DIAGNOSIS — S52123A Displaced fracture of head of unspecified radius, initial encounter for closed fracture: Secondary | ICD-10-CM

## 2014-02-23 DIAGNOSIS — I609 Nontraumatic subarachnoid hemorrhage, unspecified: Secondary | ICD-10-CM

## 2014-02-23 HISTORY — DX: Nontraumatic subarachnoid hemorrhage, unspecified: I60.9

## 2014-02-23 HISTORY — DX: Displaced fracture of head of unspecified radius, initial encounter for closed fracture: S52.123A

## 2014-02-26 ENCOUNTER — Emergency Department (HOSPITAL_COMMUNITY): Payer: Medicare Other

## 2014-02-26 ENCOUNTER — Encounter (HOSPITAL_COMMUNITY): Payer: Self-pay | Admitting: Family Medicine

## 2014-02-26 ENCOUNTER — Inpatient Hospital Stay (HOSPITAL_COMMUNITY)
Admission: EM | Admit: 2014-02-26 | Discharge: 2014-02-27 | DRG: 087 | Disposition: A | Discharge status: Home / Self Care | Payer: Medicare Other | Attending: Neurosurgery | Admitting: Neurosurgery

## 2014-02-26 DIAGNOSIS — Z79899 Other long term (current) drug therapy: Secondary | ICD-10-CM

## 2014-02-26 DIAGNOSIS — Z87891 Personal history of nicotine dependence: Secondary | ICD-10-CM

## 2014-02-26 DIAGNOSIS — E0590 Thyrotoxicosis, unspecified without thyrotoxic crisis or storm: Secondary | ICD-10-CM | POA: Diagnosis present

## 2014-02-26 DIAGNOSIS — Z96653 Presence of artificial knee joint, bilateral: Secondary | ICD-10-CM | POA: Diagnosis present

## 2014-02-26 DIAGNOSIS — S065X0A Traumatic subdural hemorrhage without loss of consciousness, initial encounter: Secondary | ICD-10-CM | POA: Diagnosis present

## 2014-02-26 DIAGNOSIS — I609 Nontraumatic subarachnoid hemorrhage, unspecified: Secondary | ICD-10-CM

## 2014-02-26 DIAGNOSIS — M25522 Pain in left elbow: Secondary | ICD-10-CM | POA: Diagnosis present

## 2014-02-26 DIAGNOSIS — W010XXA Fall on same level from slipping, tripping and stumbling without subsequent striking against object, initial encounter: Secondary | ICD-10-CM | POA: Diagnosis present

## 2014-02-26 DIAGNOSIS — Z7982 Long term (current) use of aspirin: Secondary | ICD-10-CM

## 2014-02-26 DIAGNOSIS — S066X0A Traumatic subarachnoid hemorrhage without loss of consciousness, initial encounter: Secondary | ICD-10-CM | POA: Diagnosis present

## 2014-02-26 DIAGNOSIS — Z853 Personal history of malignant neoplasm of breast: Secondary | ICD-10-CM

## 2014-02-26 DIAGNOSIS — S52122A Displaced fracture of head of left radius, initial encounter for closed fracture: Secondary | ICD-10-CM | POA: Diagnosis present

## 2014-02-26 DIAGNOSIS — S0181XA Laceration without foreign body of other part of head, initial encounter: Secondary | ICD-10-CM | POA: Diagnosis present

## 2014-02-26 DIAGNOSIS — Z85828 Personal history of other malignant neoplasm of skin: Secondary | ICD-10-CM

## 2014-02-26 DIAGNOSIS — Y9301 Activity, walking, marching and hiking: Secondary | ICD-10-CM | POA: Diagnosis not present

## 2014-02-26 DIAGNOSIS — I10 Essential (primary) hypertension: Secondary | ICD-10-CM | POA: Diagnosis present

## 2014-02-26 DIAGNOSIS — E785 Hyperlipidemia, unspecified: Secondary | ICD-10-CM | POA: Diagnosis present

## 2014-02-26 DIAGNOSIS — W19XXXA Unspecified fall, initial encounter: Secondary | ICD-10-CM

## 2014-02-26 MED ORDER — ONDANSETRON HCL 4 MG/2ML IJ SOLN: 4.0000 mg | Freq: Four times a day (QID) | INTRAMUSCULAR | Status: DC | PRN

## 2014-02-26 MED ORDER — LORATADINE 10 MG PO TABS: 10.0000 mg | ORAL_TABLET | Freq: Every day | ORAL | Status: DC

## 2014-02-26 MED ORDER — ACETAMINOPHEN 325 MG PO TABS: 650.0000 mg | ORAL_TABLET | ORAL | Status: DC | PRN

## 2014-02-26 MED ORDER — FLUTICASONE PROPIONATE 50 MCG/ACT NA SUSP
1.0000 | Freq: Every day | NASAL | Status: DC
  Filled 2014-02-26: qty 16

## 2014-02-26 MED ORDER — HYDROCODONE-ACETAMINOPHEN 5-325 MG PO TABS: 1.0000 | ORAL_TABLET | ORAL | Status: DC | PRN

## 2014-02-26 MED ORDER — LOSARTAN POTASSIUM 50 MG PO TABS: 100.0000 mg | ORAL_TABLET | Freq: Every day | ORAL | Status: DC

## 2014-02-26 MED ORDER — ATENOLOL 25 MG PO TABS: 25.0000 mg | ORAL_TABLET | Freq: Every day | ORAL | Status: DC

## 2014-02-26 MED ORDER — VITAMIN D 1000 UNITS PO TABS
2000.0000 [IU] | ORAL_TABLET | Freq: Every day | ORAL | Status: DC
  Filled 2014-02-26: qty 2

## 2014-02-26 MED ORDER — LEVOTHYROXINE SODIUM 25 MCG PO TABS
25.0000 ug | ORAL_TABLET | Freq: Every day | ORAL | Status: DC
  Administered 2014-02-27: 25 ug via ORAL

## 2014-02-26 MED ORDER — ONDANSETRON HCL 4 MG PO TABS: 4.0000 mg | ORAL_TABLET | Freq: Four times a day (QID) | ORAL | Status: DC | PRN

## 2014-02-26 MED ORDER — OCUVITE-LUTEIN PO CAPS
1.0000 | ORAL_CAPSULE | Freq: Two times a day (BID) | ORAL | Status: DC
  Filled 2014-02-26 (×4): qty 1

## 2014-02-26 MED ORDER — ADULT MULTIVITAMIN W/MINERALS CH
1.0000 | ORAL_TABLET | Freq: Every day | ORAL | Status: DC
  Filled 2014-02-26: qty 1

## 2014-02-26 MED ORDER — HYDROCHLOROTHIAZIDE 25 MG PO TABS: 25.0000 mg | ORAL_TABLET | Freq: Every day | ORAL | Status: DC

## 2014-02-26 MED ORDER — LOSARTAN POTASSIUM-HCTZ 100-25 MG PO TABS: 1.0000 | ORAL_TABLET | Freq: Every day | ORAL | Status: DC

## 2014-02-26 MED ORDER — HYDROCODONE-ACETAMINOPHEN 5-325 MG PO TABS: 2.0000 | ORAL_TABLET | ORAL | Status: DC | PRN

## 2014-02-26 MED ORDER — SIMVASTATIN 20 MG PO TABS
20.0000 mg | ORAL_TABLET | Freq: Every day | ORAL | Status: DC
  Administered 2014-02-26: 20 mg via ORAL

## 2014-02-26 MED ORDER — CALCIUM CARBONATE-VITAMIN D 500-200 MG-UNIT PO TABS
1.0000 | ORAL_TABLET | Freq: Two times a day (BID) | ORAL | Status: DC
  Administered 2014-02-27: 1 via ORAL

## 2014-02-26 MED ORDER — ASPIRIN 81 MG PO CHEW
81.0000 mg | CHEWABLE_TABLET | Freq: Every day | ORAL | Status: DC
  Filled 2014-02-26: qty 1

## 2014-02-26 NOTE — ED Notes (Signed)
Patient transported to CT 

## 2014-02-26 NOTE — H&P (Signed)
Courtney Castro is an 80 y.o. female.   Chief Complaint: hit head and broke left elbow in fall HPI: Patient presents after mechanical fall with pain in her head, left elbow. Patient was in her usual state of health until the fall. Patient is unsure of how she fell, but it seems as though she may have slipped. Afterwards, she was dazed, and had some speech finding difficulty. That persisted for some time, but on arrival to the ER, the patient has no speech finding difficulties, no weakness anywhere, no visual changes, no headache, no neck pain. Patient does complain of pain about the left forehead, left elbow, each of these areas are sore. Patient has a notable history of subdural hematoma following a fall several years ago, which was managed conservatively by Dr. Saintclair Halsted.  Past Medical History  Diagnosis Date  . Hypertension   . Hyperlipidemia   . Hyperthyroidism   . Allergy     seasonal allergies  . IBS (irritable bowel syndrome) 1984  . Colon polyp 09/2000  . Macular degeneration 2008    mild  . Basal cell carcinoma 2007, 2015    face and shoulder (Dr. Derrel Nip)  . Diverticulosis     seen on colonoscopy  . Breast cancer 1989    R breast-microinvasive papillary(lumpectomy and XRT)  . Subdural hematoma 06/2008    Dr.Cram  . Elevated liver function tests 06/2009    galllstones and dilated CBD--s/p ERCP and chole  . Rosanna Randy syndrome     Past Surgical History  Procedure Laterality Date  . Abdominal hysterectomy      bladder repair  . Breast lumpectomy  1989    right breast  . Total knee arthroplasty Right 2000  . Total knee arthroplasty Left 2004    Dr. Wynelle Link  . Hammer toe surgery  05/2004    right foot  . Fracture left foot  01/2007    5th metatarsal-treated with boot  . Tubal ligation    . Ercp  07/2009    sphincterotomy  . Laparoscopic cholecystectomy  07/2009    Family History  Problem Relation Age of Onset  . Leukemia Mother   . Ovarian cancer Mother     ???  hysterectomy age 49's  . Cancer Mother     leukemia; possible ovarian cancer (hysterectomy in her 35's)  . Coronary artery disease Father   . Hypertension Father   . Heart disease Father     onset late 103's  . Coronary artery disease Brother 60    coronary artery bypass grafting, pacemaker  . Heart disease Brother     CABG in 60's, pacemaker  . Breast cancer Maternal Aunt   . Cancer Maternal Aunt     breast  . Diabetes Paternal Uncle   . Diabetes Paternal Grandmother   . Cancer Cousin     breast  . Breast cancer Cousin   . Breast cancer Daughter 57   Social History:  reports that she quit smoking about 50 years ago. She has never used smokeless tobacco. She reports that she drinks alcohol. She reports that she does not use illicit drugs.  Allergies:  Allergies  Allergen Reactions  . Percocet [Oxycodone-Acetaminophen] Other (See Comments)    Upset stomach     (Not in a hospital admission)  No results found for this or any previous visit (from the past 48 hour(s)). Dg Elbow Complete Left  02/26/2014   CLINICAL DATA:  Status post fall while walking in the neighborhood. Left posterior  elbow pain. Initial encounter.  EXAM: LEFT ELBOW - COMPLETE 3+ VIEW  COMPARISON:  None.  FINDINGS: There is a nondisplaced fracture of the left radial head involving the articular surface with a 1 mm step-off. There is a moderate joint effusion. There is no other acute fracture or dislocation. There is no evidence of arthropathy or other focal bone abnormality. Soft tissues are unremarkable.  IMPRESSION: Nondisplaced fracture of the left radial head involving the articular surface.   Electronically Signed   By: Kathreen Devoid   On: 02/26/2014 18:47   Ct Head Wo Contrast  02/26/2014   CLINICAL DATA:  Patient slipped and fell while walking in neighborhood, has gash above left eye, left frontal headache  EXAM: CT HEAD WITHOUT CONTRAST  TECHNIQUE: Contiguous axial images were obtained from the base of the  skull through the vertex without intravenous contrast.  COMPARISON:  11/30/2008  FINDINGS: No skull fracture. Focal soft tissue defect over the left frontal bone. In the anterior tip of the left frontal lobe there is a 15 mm cortical and a subcortical focus of hyperattenuation consistent with contusion. There is a very small volume of acute blood in the more inferior anterior falx. There is a tiny volume of acute subdural blood in the dural reflections at the base of the brain.  Moderate diffuse age-related atrophy. No evidence of mass, infarct, or hydrocephalus. No midline shift.  IMPRESSION: Acute left frontal lobe contusion with very small volume acute multifocal subdural hematoma. Critical Value/emergent results were called by telephone at the time of interpretation on 02/26/2014 at 6:09 pm to Dr. Carmin Muskrat , who verbally acknowledged these results.   Electronically Signed   By: Skipper Cliche M.D.   On: 02/26/2014 18:09    Review of Systems  Constitutional: Negative.   HENT: Negative.   Eyes: Negative.   Respiratory: Negative.   Cardiovascular: Negative.   Gastrointestinal: Negative.   Genitourinary: Negative.   Musculoskeletal: Negative.   Skin: Negative.   Neurological: Negative.   Endo/Heme/Allergies: Negative.   Psychiatric/Behavioral: Negative.     Blood pressure 130/64, pulse 91, temperature 98.3 F (36.8 C), temperature source Oral, resp. rate 20, SpO2 96 %. Physical Exam  Constitutional: She is oriented to person, place, and time. She appears well-developed and well-nourished.  HENT:  Head: Normocephalic. Head is with abrasion.    Eyes: Conjunctivae and EOM are normal. Pupils are equal, round, and reactive to light.  Neck: Normal range of motion. Neck supple.  Cardiovascular: Normal rate and regular rhythm.   Respiratory: Effort normal and breath sounds normal.  GI: Soft. Bowel sounds are normal.  Musculoskeletal: Normal range of motion.  Neurological: She is alert  and oriented to person, place, and time. She has normal reflexes.  Skin: Skin is warm and dry.  Psychiatric: She has a normal mood and affect. Her behavior is normal. Judgment and thought content normal.     Assessment/Plan Patient has multiple small punctate areas of subdural and SAH after a fall.  She is doing well.  She has a fractured elbow.  I will admit the patient for overnight observation and Orthopedics will evaluate patient in am.  Currently, elbow is immobilized in a sling.  Patient will be admitted to floor, mobilized with PT, and if repeat Head CT is stable, will likely be discharged in am.  Margaux Engen D 02/26/2014, 9:31 PM

## 2014-02-26 NOTE — Progress Notes (Signed)
Pt. Arrived to [address] with husband by side and one nurse tech. VSS and reports no pain. Will continue to monitor. Joaquin Bend E, RN  02/26/2014 11:30 PM

## 2014-02-26 NOTE — ED Notes (Signed)
Pt here for head injury due to fall. Pt has lac to forehead. sts really doesn't remember the fall and was confused afterwards.

## 2014-02-26 NOTE — ED Provider Notes (Signed)
CSN: [id number]     Arrival date & time 02/26/14  1643 History   First MD Initiated Contact with Patient 02/26/14 1728     Chief Complaint  Patient presents with  . Fall     (Consider location/radiation/quality/duration/timing/severity/associated sxs/prior Treatment) HPI  Patient presents after mechanical fall with pain in her head, left elbow. Patient was in her usual state of health until the fall.  Patient is unsure of how she fell, but it seems as though she may have slipped. Afterwards, she was dazed, and had some speech finding difficulty.  That persisted for some time, but on my evaluation the patient has no speech finding difficulties, no weakness anywhere, no visual changes, no headache, no neck pain. Patient does complain of pain about the left forehead, left elbow, each of these areas are sore. Patient has a notable history of subdural hematoma following a fall several years ago.   Past Medical History  Diagnosis Date  . Hypertension   . Hyperlipidemia   . Hyperthyroidism   . Allergy     seasonal allergies  . IBS (irritable bowel syndrome) 1984  . Colon polyp 09/2000  . Macular degeneration 2008    mild  . Basal cell carcinoma 2007, 2015    face and shoulder (Dr. Derrel Nip)  . Diverticulosis     seen on colonoscopy  . Breast cancer 1989    R breast-microinvasive papillary(lumpectomy and XRT)  . Subdural hematoma 06/2008    Dr.Cram  . Elevated liver function tests 06/2009    galllstones and dilated CBD--s/p ERCP and chole  . Rosanna Randy syndrome    Past Surgical History  Procedure Laterality Date  . Abdominal hysterectomy      bladder repair  . Breast lumpectomy  1989    right breast  . Total knee arthroplasty Right 2000  . Total knee arthroplasty Left 2004    Dr. Wynelle Link  . Hammer toe surgery  05/2004    right foot  . Fracture left foot  01/2007    5th metatarsal-treated with boot  . Tubal ligation    . Ercp  07/2009    sphincterotomy  . Laparoscopic  cholecystectomy  07/2009   Family History  Problem Relation Age of Onset  . Leukemia Mother   . Ovarian cancer Mother     ??? hysterectomy age 31's  . Cancer Mother     leukemia; possible ovarian cancer (hysterectomy in her 38's)  . Coronary artery disease Father   . Hypertension Father   . Heart disease Father     onset late 69's  . Coronary artery disease Brother 60    coronary artery bypass grafting, pacemaker  . Heart disease Brother     CABG in 60's, pacemaker  . Breast cancer Maternal Aunt   . Cancer Maternal Aunt     breast  . Diabetes Paternal Uncle   . Diabetes Paternal Grandmother   . Cancer Cousin     breast  . Breast cancer Cousin   . Breast cancer Daughter 73   History  Substance Use Topics  . Smoking status: Former Smoker -- 0.10 packs/day for 3 years    Quit date: 03/27/1963  . Smokeless tobacco: Never Used  . Alcohol Use: Yes     Comment: 1 glass of wine per week.   OB History    Gravida Para Term Preterm AB TAB SAB Ectopic Multiple Living   2 2        2      Review  of Systems  Constitutional:       Per HPI, otherwise negative  HENT:       Per HPI, otherwise negative  Respiratory:       Per HPI, otherwise negative  Cardiovascular:       Per HPI, otherwise negative  Gastrointestinal: Negative for vomiting.  Endocrine:       Negative aside from HPI  Genitourinary:       Neg aside from HPI   Musculoskeletal:       Per HPI, otherwise negative  Skin: Negative.   Neurological: Negative for syncope.      Allergies  Percocet  Home Medications   Prior to Admission medications   Medication Sig Start Date End Date Taking? Authorizing Provider  aspirin 81 MG tablet Take 81 mg by mouth daily.    Historical Provider, MD  atenolol (TENORMIN) 25 MG tablet TAKE 1 TABLET (25 MG TOTAL) BY MOUTH DAILY. 02/08/14   Rita Ohara, MD  Calcium Carbonate-Vitamin D (CALTRATE 600+D) 600-400 MG-UNIT per tablet Take 1 tablet by mouth 2 (two) times daily.       Historical Provider, MD  cephALEXin (KEFLEX) 500 MG capsule Take 1 capsule (500 mg total) by mouth 4 (four) times daily. 11/18/13   Rita Ohara, MD  Cholecalciferol (VITAMIN D) 2000 UNITS tablet Take 2,000 Units by mouth daily.      Historical Provider, MD  fexofenadine (ALLEGRA) 180 MG tablet Take 180 mg by mouth daily.      Historical Provider, MD  fluticasone (FLONASE) 50 MCG/ACT nasal spray PLACE 2 SPRAYS INTO THE NOSE DAILY. 10/14/13   Rita Ohara, MD  GLUCOSAMINE PO Take 2,000 mg by mouth 2 (two) times daily.      Historical Provider, MD  levothyroxine (SYNTHROID, LEVOTHROID) 25 MCG tablet TAKE 1 TABLET (25 MCG TOTAL) BY MOUTH DAILY. 09/04/13   Rita Ohara, MD  losartan-hydrochlorothiazide (HYZAAR) 100-25 MG per tablet TAKE 1 TABLET BY MOUTH DAILY. 02/08/14   Rita Ohara, MD  Multiple Vitamins-Minerals (CENTRUM SILVER PO) Take 1 tablet by mouth daily.      Historical Provider, MD  Multiple Vitamins-Minerals (PRESERVISION/LUTEIN) CAPS Take 1 capsule by mouth 2 (two) times daily.      Historical Provider, MD  simvastatin (ZOCOR) 20 MG tablet Take 1 tablet (20 mg total) by mouth at bedtime. 09/03/13   Rita Ohara, MD   BP 161/80 mmHg  Pulse 88  Temp(Src) 98.3 F (36.8 C)  Resp 18  SpO2 96% Physical Exam  Constitutional: She is oriented to person, place, and time. She appears well-developed and well-nourished. No distress.  HENT:  Head: Normocephalic and atraumatic.    Eyes: Conjunctivae and EOM are normal.  Neck: Full passive range of motion without pain. No spinous process tenderness and no muscular tenderness present. No rigidity. No edema, no erythema and normal range of motion present.  Cardiovascular: Normal rate and regular rhythm.   Pulmonary/Chest: Effort normal and breath sounds normal. No stridor. No respiratory distress.  Abdominal: She exhibits no distension.  Musculoskeletal: She exhibits no edema.       Left shoulder: Normal.       Left elbow: She exhibits normal range of motion, no  swelling, no effusion, no deformity and no laceration. Tenderness found.       Left wrist: Normal.       Arms: Neurological: She is alert and oriented to person, place, and time. No cranial nerve deficit.  Skin: Skin is warm and dry.  Psychiatric: She has  a normal mood and affect.  Nursing note and vitals reviewed.   ED Course  Procedures (including critical care time)  Cardiac 80 sinus rhythm normal Pulse ox 97% room air normal   LACERATION REPAIR Performed by: Carmin Muskrat Authorized by: Carmin Muskrat Consent: Verbal consent obtained. Risks and benefits: risks, benefits and alternatives were discussed Consent given by: patient Patient identity confirmed: provided demographic data Prepped and Draped in normal sterile fashion Wound explored  Laceration Location: L forehead  Laceration Length: 4cm - "L" shaped  No Foreign Bodies seen or palpated  Irrigation method: syringe Amount of cleaning: standard  Skin closure: sterile adhesive  Number of sutures: 1 tube  Technique: wound was well approximated, though with adjacent abrasions  Patient tolerance: Patient tolerated the procedure well with no immediate complications.   Imaging Review Dg Elbow Complete Left  02/26/2014   CLINICAL DATA:  Status post fall while walking in the neighborhood. Left posterior elbow pain. Initial encounter.  EXAM: LEFT ELBOW - COMPLETE 3+ VIEW  COMPARISON:  None.  FINDINGS: There is a nondisplaced fracture of the left radial head involving the articular surface with a 1 mm step-off. There is a moderate joint effusion. There is no other acute fracture or dislocation. There is no evidence of arthropathy or other focal bone abnormality. Soft tissues are unremarkable.  IMPRESSION: Nondisplaced fracture of the left radial head involving the articular surface.   Electronically Signed   By: Kathreen Devoid   On: 02/26/2014 18:47   Ct Head Wo Contrast  02/26/2014   CLINICAL DATA:  Patient slipped  and fell while walking in neighborhood, has gash above left eye, left frontal headache  EXAM: CT HEAD WITHOUT CONTRAST  TECHNIQUE: Contiguous axial images were obtained from the base of the skull through the vertex without intravenous contrast.  COMPARISON:  11/30/2008  FINDINGS: No skull fracture. Focal soft tissue defect over the left frontal bone. In the anterior tip of the left frontal lobe there is a 15 mm cortical and a subcortical focus of hyperattenuation consistent with contusion. There is a very small volume of acute blood in the more inferior anterior falx. There is a tiny volume of acute subdural blood in the dural reflections at the base of the brain.  Moderate diffuse age-related atrophy. No evidence of mass, infarct, or hydrocephalus. No midline shift.  IMPRESSION: Acute left frontal lobe contusion with very small volume acute multifocal subdural hematoma. Critical Value/emergent results were called by telephone at the time of interpretation on 02/26/2014 at 6:09 pm to Dr. Carmin Muskrat , who verbally acknowledged these results.   Electronically Signed   By: Skipper Cliche M.D.   On: 02/26/2014 18:09   I discussed the CT findings with our radiologist. Subsequent discussed the patient's findings with our neurosurgeon.  Ice packs and sling applied to the left elbow   After the patient returned from CT scan, she was reexamined, continued to have no complaints of speech difficulty, word finding difficulty, visual loss, weakness. She did continue to complain of elbow pain. . After the repeat evaluation I also also discussed her case with the trauma surgery team, and orthopedics.  On re-exam the patient was calm.  Patient will be admitted by our neurosurgery team. MDM   Final diagnoses:  Fall   subdural hematoma Radial head fracture Abrasions of both hands  This female presents after mechanical fall, with expressive aphasia that resolved prior to my evaluation.  However, the  patient had left forehead laceration, which  was repaired, and given concern for intracranial injury, the patient had head CT performed.  This was notable for demonstration of a left frontal lobe contusion, subarachnoid and subdural bleeding.  During the patient's evaluation I discussed the case several times with our neurosurgeon, who assisted greatly with her evaluation, management, and admitted the patient for further evaluation.    CRITICAL CARE Performed by: Carmin Muskrat Total critical care time: 40 Critical care time was exclusive of separately billable procedures and treating other patients. Critical care was necessary to treat or prevent imminent or life-threatening deterioration. Critical care was time spent personally by me on the following activities: development of treatment plan with patient and/or surrogate as well as nursing, discussions with consultants, evaluation of patient's response to treatment, examination of patient, obtaining history from patient or surrogate, ordering and performing treatments and interventions, ordering and review of laboratory studies, ordering and review of radiographic studies, pulse oximetry and re-evaluation of patient's condition.  Carmin Muskrat, MD 02/27/14 Berniece Salines

## 2014-02-27 ENCOUNTER — Inpatient Hospital Stay (HOSPITAL_COMMUNITY): Payer: Medicare Other

## 2014-02-27 NOTE — Consult Note (Signed)
Melrose Nakayama, MD  Chauncey Cruel, PA-C  Loni Dolly, PA-C                                  [facility]                [address] [place], [place]  [address]   [place]            MRN:  [id number] DOB/SEX:  05/28/1933/female     CHIEF COMPLAINT:  Painful left elbow  HISTORY: Jahmia Berrett Whitesideis a 80 y.o. female with recent fall sustained walking with her husband.  Admitted by Nsurg with subdural hematoma.  Also had some left nondom elbow pain and xray shows fracture.  She is a retired Pharmacist, hospital.  Also has history of right and left knee replacements   PAST MEDICAL HISTORY: Patient Active Problem List   Diagnosis Date Noted  . Subarachnoid hemorrhage 02/26/2014  . Shoulder pain 05/26/2012  . Impaired fasting glucose 02/19/2012  . Essential hypertension, benign 01/31/2011  . Unspecified hypothyroidism 01/31/2011  . Mixed hyperlipidemia 01/31/2011  . Allergic rhinitis, cause unspecified 01/31/2011   Past Medical History  Diagnosis Date  . Hypertension   . Hyperlipidemia   . Hyperthyroidism   . Allergy     seasonal allergies  . IBS (irritable bowel syndrome) 1984  . Colon polyp 09/2000  . Macular degeneration 2008    mild  . Basal cell carcinoma 2007, 2015    face and shoulder (Dr. Derrel Nip)  . Diverticulosis     seen on colonoscopy  . Breast cancer 1989    R breast-microinvasive papillary(lumpectomy and XRT)  . Subdural hematoma 06/2008    Dr.Cram  . Elevated liver function tests 06/2009    galllstones and dilated CBD--s/p ERCP and chole  . Rosanna Randy syndrome    Past Surgical History  Procedure Laterality Date  . Abdominal hysterectomy      bladder repair  . Breast lumpectomy  1989    right breast  . Total knee arthroplasty Right 2000  . Total knee arthroplasty Left 2004    Dr. Wynelle Link  . Hammer toe surgery  05/2004    right foot  . Fracture left foot  01/2007    5th metatarsal-treated with boot  . Tubal  ligation    . Ercp  07/2009    sphincterotomy  . Laparoscopic cholecystectomy  07/2009     MEDICATIONS:  Current facility-administered medications: acetaminophen (TYLENOL) tablet 650 mg, 650 mg, Oral, Q4H PRN, Erline Levine, MD;  aspirin chewable tablet 81 mg, 81 mg, Oral, Daily, Erline Levine, MD, 81 mg at 02/26/14 2200;  atenolol (TENORMIN) tablet 25 mg, 25 mg, Oral, Daily, Erline Levine, MD, 25 mg at 02/26/14 2200;  calcium-vitamin D (OSCAL WITH D) 500-200 MG-UNIT per tablet 1 tablet, 1 tablet, Oral, BID WC, Erline Levine, MD, 1 tablet at 02/27/14 0740 cholecalciferol (VITAMIN D) tablet 2,000 Units, 2,000 Units, Oral, Daily, Erline Levine, MD, 2,000 Units at 02/26/14 2200;  fluticasone (FLONASE) 50 MCG/ACT nasal spray 1 spray, 1 spray, Each Nare, Daily, Erline Levine, MD;  losartan (COZAAR) tablet 100 mg, 100 mg, Oral, Daily **AND** hydrochlorothiazide (HYDRODIURIL) tablet 25 mg, 25 mg, Oral, Daily, Erline Levine, MD HYDROcodone-acetaminophen (NORCO/VICODIN) 5-325 MG per tablet 1 tablet, 1 tablet, Oral, Q4H PRN, Erline Levine, MD;  HYDROcodone-acetaminophen (NORCO/VICODIN) 5-325 MG per tablet 2 tablet, 2 tablet, Oral, Q4H PRN, Erline Levine, MD;  levothyroxine (SYNTHROID, LEVOTHROID) tablet 25 mcg, 25 mcg, Oral, QAC breakfast, Erline Levine, MD, 25 mcg at 02/27/14 0740;  loratadine (CLARITIN) tablet 10 mg, 10 mg, Oral, Daily, Erline Levine, MD multivitamin with minerals tablet 1 tablet, 1 tablet, Oral, Daily, Erline Levine, MD, 1 tablet at 02/26/14 2200;  multivitamin-lutein (OCUVITE-LUTEIN) capsule 1 capsule, 1 capsule, Oral, BID, Erline Levine, MD, 1 capsule at 02/26/14 2200;  ondansetron (ZOFRAN) tablet 4 mg, 4 mg, Oral, Q6H PRN **OR** ondansetron (ZOFRAN) injection 4 mg, 4 mg, Intravenous, Q6H PRN, Erline Levine, MD simvastatin (ZOCOR) tablet 20 mg, 20 mg, Oral, QHS, Erline Levine, MD, 20 mg at 02/26/14 2347  ALLERGIES:   Allergies  Allergen Reactions  . Percocet [Oxycodone-Acetaminophen] Other (See Comments)     Upset stomach    REVIEW OF SYSTEMS: REVIEWED IN DETAIL IN CHART  FAMILY HISTORY:   Family History  Problem Relation Age of Onset  . Leukemia Mother   . Ovarian cancer Mother     ??? hysterectomy age 64's  . Cancer Mother     leukemia; possible ovarian cancer (hysterectomy in her 2's)  . Coronary artery disease Father   . Hypertension Father   . Heart disease Father     onset late 56's  . Coronary artery disease Brother 60    coronary artery bypass grafting, pacemaker  . Heart disease Brother     CABG in 60's, pacemaker  . Breast cancer Maternal Aunt   . Cancer Maternal Aunt     breast  . Diabetes Paternal Uncle   . Diabetes Paternal Grandmother   . Cancer Cousin     breast  . Breast cancer Cousin   . Breast cancer Daughter 35    SOCIAL HISTORY:   History  Substance Use Topics  . Smoking status: Former Smoker -- 0.10 packs/day for 3 years    Quit date: 03/27/1963  . Smokeless tobacco: Never Used  . Alcohol Use: Yes     Comment: 1 glass of wine per week.     EXAMINATION: Vital signs in last 24 hours: Temp:  [97.9 F (36.6 C)-99.5 F (37.5 C)] 97.9 F (36.6 C) (12/05 0919) Pulse Rate:  [64-91] 64 (12/05 0919) Resp:  [13-25] 20 (12/05 0919) BP: (127-161)/(55-80) 158/78 mmHg (12/05 0919) SpO2:  [94 %-98 %] 98 % (12/05 0919) Weight:  [146 lb 6.4 oz (66.407 kg)] 146 lb 6.4 oz (66.407 kg) (12/04 2254)  Left frontal lac on forehead.  Alert and oriented times three.    Musculoskeletal Exam:   Left elbow rom is 30-90 with near full rotation.   DIAGNOSTIC STUDIES: Recent laboratory studies: No results for input(s): WBC, HGB, HCT, PLT in the last 168 hours. No results for input(s): NA, K, CL, CO2, BUN, CREATININE, GLUCOSE, CALCIUM in the last 168 hours. Lab Results  Component Value Date   INR 1.0 06/28/2008     Recent Radiographic Studies :  Dg Elbow Complete Left  02/26/2014   CLINICAL DATA:  Status post fall while walking in the neighborhood. Left  posterior elbow pain. Initial encounter.  EXAM: LEFT ELBOW - COMPLETE 3+ VIEW  COMPARISON:  None.  FINDINGS: There is a nondisplaced fracture of the left radial head involving the articular surface with a 1 mm step-off. There is a moderate joint effusion. There is no other acute fracture or dislocation. There is no evidence of arthropathy or other focal bone abnormality. Soft tissues are unremarkable.  IMPRESSION: Nondisplaced fracture of the left radial head involving the articular surface.  Electronically Signed   By: Kathreen Devoid   On: 02/26/2014 18:47   Ct Head Wo Contrast  02/27/2014   CLINICAL DATA:  Recent fall, intracranial hemorrhage. Subsequent encounter.  EXAM: CT HEAD WITHOUT CONTRAST  TECHNIQUE: Contiguous axial images were obtained from the base of the skull through the vertex without contrast.  COMPARISON:  02/26/2014  FINDINGS: Stable anterior medial left frontal 15 mm cortical hyperdense contusion. Similar pattern of scattered bilateral subarachnoid blood layering within sulci. Trace amount of midline parafalcine Subdural hemorrhage. No significant interval change. Stable brain atrophy pattern without definite infarction, midline shift, herniation, hydrocephalus. Gray-white matter differentiation maintained. No cerebellar abnormality. Atherosclerosis of the intracranial vessels. Orbits are symmetric. Mastoids and sinuses remain clear with a small left maxillary retention cyst measuring 7 mm posteriorly. No skull fracture evident.  IMPRESSION: Stable acute anterior medial left frontal cortical contusion.  Stable scattered small amount of subarachnoid and midline parafalcine subdural hemorrhage.   Electronically Signed   By: Daryll Brod M.D.   On: 02/27/2014 08:02   Ct Head Wo Contrast  02/26/2014   CLINICAL DATA:  Patient slipped and fell while walking in neighborhood, has gash above left eye, left frontal headache  EXAM: CT HEAD WITHOUT CONTRAST  TECHNIQUE: Contiguous axial images were  obtained from the base of the skull through the vertex without intravenous contrast.  COMPARISON:  11/30/2008  FINDINGS: No skull fracture. Focal soft tissue defect over the left frontal bone. In the anterior tip of the left frontal lobe there is a 15 mm cortical and a subcortical focus of hyperattenuation consistent with contusion. There is a very small volume of acute blood in the more inferior anterior falx. There is a tiny volume of acute subdural blood in the dural reflections at the base of the brain.  Moderate diffuse age-related atrophy. No evidence of mass, infarct, or hydrocephalus. No midline shift.  IMPRESSION: Acute left frontal lobe contusion with very small volume acute multifocal subdural hematoma. Critical Value/emergent results were called by telephone at the time of interpretation on 02/26/2014 at 6:09 pm to Dr. Carmin Muskrat , who verbally acknowledged these results.   Electronically Signed   By: Skipper Cliche M.D.   On: 02/26/2014 18:09    ASSESSMENT:  Left minimally displaced radial head fracture   PLAN:  Manage nonop in sling prn for comfort mostly.  Need to see her back in 2-3 weeks for xray.  OK to travel to [place] in terms of elbow issue.    Honestee Revard G 02/27/2014, 9:52 AM

## 2014-02-27 NOTE — Evaluation (Signed)
Physical Therapy Evaluation/Discharge Patient Details Name: Courtney Castro MRN: [id number] DOB: [birth year] Today's Date: 02/27/2014   History of Present Illness  80 y.o. female admitted to [facility] s/p fall with L elbow fx, left sided head laceration.  MRI revealed multiple small punctate areas of subdural and SAH.  Neurosurgery is monitoring this conservatively.  L elbow in sling, awaiting ortho consult.  Pt with significant PMHx of HTN, hyperthyroidism, macular degeneration, skin CA, Breast CA, SDH from fall on black ice (06/2008), bil TKA, L foot fx.   Clinical Impression  Pt is mobilizing at a mod I level- using right hand for stability during transitions, but once up, other than being mildly slow and cautious, pt is ambulating without balance deficits or gait deviations. Other than what seems to be some very brief retrograde amnesia she also seems pretty clear cognitively.  PT is signing off as pt does not currently have any acute or f/u PT needs at this time.      Follow Up Recommendations No PT follow up    Equipment Recommendations  None recommended by PT    Recommendations for Other Services   NA    Precautions / Restrictions Precautions Required Braces or Orthoses: Sling Restrictions Weight Bearing Restrictions:  (none listed, but pt was able to be NWB during bed mobility)      Mobility  Bed Mobility Overal bed mobility: Independent             General bed mobility comments: HOB flat no rail, practiced getting up out of the right side of the bed (this is the side she gets out of at home).   Transfers Overall transfer level: Modified independent Equipment used: None             General transfer comment: Pt using right arm to help control transitions.   Ambulation/Gait Ambulation/Gait assistance: Modified independent (Device/Increase time) Ambulation Distance (Feet): 510 Feet Assistive device: None Gait Pattern/deviations: WFL(Within Functional Limits) Gait  velocity: decreased Gait velocity interpretation: Below normal speed for age/gender General Gait Details: Pt is going slower than usual and being more cautious.   Stairs Stairs: Yes Stairs assistance: Modified independent (Device/Increase time) Stair Management: One rail Left;Forwards;Sideways;Step to pattern Number of Stairs: 5 General stair comments: Safe technique simulating home entry and ascending and descending stairs to her bedroom upstairs.          Balance Overall balance assessment: No apparent balance deficits (not formally assessed)                                           Pertinent Vitals/Pain Pain Assessment: 0-10 Pain Score: 7  Pain Location: left elbow, no reports of Head pain Pain Descriptors / Indicators: Aching Pain Intervention(s): Limited activity within patient's tolerance;Monitored during session;Repositioned;Patient requesting pain meds-RN notified    Home Living Family/patient expects to be discharged to:: Private residence Living Arrangements: Spouse/significant other Available Help at Discharge: Family;Available 24 hours/day Type of Home: House Home Access: Stairs to enter Entrance Stairs-Rails: None Entrance Stairs-Number of Steps: 3 Home Layout: Multi-level Home Equipment: Cane - single point;Walker - 2 wheels      Prior Function Level of Independence: Independent         Comments: drives, retired Manufacturing systems engineer [employer]   Dominant Hand: Right    Extremity/Trunk Assessment   Upper Extremity Assessment: LUE deficits/detail  LUE Deficits / Details: left arm in sling, but good active movement of fingers, shoulder, and elbow, no resistance applied, sling kept on during mobility and NWB during bed mobility and gait.    Lower Extremity Assessment: Overall WFL for tasks assessed      Cervical / Trunk Assessment: Normal  Communication   Communication: No difficulties  Cognition  Arousal/Alertness: Awake/alert Behavior During Therapy: WFL for tasks assessed/performed Overall Cognitive Status: Within Functional Limits for tasks assessed                      General Comments General comments (skin integrity, edema, etc.): Pt reports she was walking and talking and tripped up a curb (didn't see it).  Seems to be an accidental fall and not related to actual balance deficits.  Her two other reported falls seem to be this way as well (black ice- previous SDH), and at a theater with steep steps and no railing.           Assessment/Plan    PT Assessment Patent does not need any further PT services  PT Diagnosis Difficulty walking;Abnormality of gait;Acute pain   PT Problem List    PT Treatment Interventions     PT Goals (Current goals can be found in the Care Plan section) Acute Rehab PT Goals Patient Stated Goal: to go home today if possible PT Goal Formulation: All assessment and education complete, DC therapy               End of Session Equipment Utilized During Treatment: Other (comment) (left arm sling) Activity Tolerance: Patient tolerated treatment well Patient left: in chair;with call bell/phone within reach Nurse Communication: Mobility status (to RN tech)         Time: 1829-9371 PT Time Calculation (min) (ACUTE ONLY): 37 min   Charges:   PT Evaluation $Initial PT Evaluation Tier I: 1 Procedure PT Treatments $Gait Training: 8-22 mins        Kevan Prouty B. Iva Posten, PT, DPT [phone]   02/27/2014, 8:45 AM

## 2014-02-27 NOTE — Discharge Summary (Signed)
Physician Discharge Summary  Patient ID: Courtney Castro MRN: [id number] DOB/AGE: 78-Jul-1935 78 y.o.  Admit date: 02/26/2014 Discharge date: 02/27/2014  Admission Diagnoses: Traumatic brain injury after fall with subarachnoid and subdural hemorrhage and left radial head fracture  Discharge Diagnoses: Traumatic brain injury after fall with subarachnoid and subdural hemorrhage and left radial head fracture  Active Problems:   Subarachnoid hemorrhage   Discharged Condition: good  Hospital Course: Patient was observed and repeat head CT was stable.  Discharged home with follow up to Neurosurgery and Orthopedics.  Consults: orthopedic surgery  Significant Diagnostic Studies: Head CT  Treatments: Observation, head CT, serial neuro assessments  Discharge Exam: Blood pressure 158/78, pulse 64, temperature 97.9 F (36.6 C), temperature source Oral, resp. rate 20, height 5\' 4"  (1.626 m), weight 66.407 kg (146 lb 6.4 oz), SpO2 98 %. Neurologic: Alert and oriented X 3, normal strength and tone. Normal symmetric reflexes. Normal coordination and gait  Disposition: Home     Medication List    TAKE these medications        aspirin 81 MG tablet  Take 81 mg by mouth daily.     atenolol 25 MG tablet  Commonly known as:  TENORMIN  TAKE 1 TABLET (25 MG TOTAL) BY MOUTH DAILY.     CALTRATE 600+D 600-400 MG-UNIT per tablet  Generic drug:  Calcium Carbonate-Vitamin D  Take 1 tablet by mouth 2 (two) times daily.     fexofenadine 180 MG tablet  Commonly known as:  ALLEGRA  Take 180 mg by mouth daily.     fluticasone 50 MCG/ACT nasal spray  Commonly known as:  FLONASE  PLACE 2 SPRAYS INTO THE NOSE DAILY.     GLUCOSAMINE PO  Take 2,000 mg by mouth 2 (two) times daily.     levothyroxine 25 MCG tablet  Commonly known as:  SYNTHROID, LEVOTHROID  TAKE 1 TABLET (25 MCG TOTAL) BY MOUTH DAILY.     losartan-hydrochlorothiazide 100-25 MG per tablet  Commonly known as:  HYZAAR  TAKE 1  TABLET BY MOUTH DAILY.     PRESERVISION/LUTEIN Caps  Take 1 capsule by mouth 2 (two) times daily.     CENTRUM SILVER PO  Take 1 tablet by mouth daily.     simvastatin 20 MG tablet  Commonly known as:  ZOCOR  Take 1 tablet (20 mg total) by mouth at bedtime.     Vitamin D 2000 UNITS tablet  Take 2,000 Units by mouth daily.         Signed: Peggyann Shoals, MD 02/27/2014, 10:01 AM

## 2014-02-27 NOTE — Progress Notes (Signed)
Discharge orders received. Pt for discharge home today. IV d/c'd. Pt's left arm in a sling. Abrasion on forehead is open to air. Pt given discharge instructions with verbalized understanding. Family in room to assist with discharge. Staff brought pt downstairs via wheelchair.

## 2014-02-27 NOTE — Progress Notes (Signed)
Subjective: Patient reports feeling better today  Objective: Vital signs in last 24 hours: Temp:  [98.3 F (36.8 C)-99.5 F (37.5 C)] 98.4 F (36.9 C) (12/05 0700) Pulse Rate:  [64-91] 64 (12/05 0700) Resp:  [13-25] 18 (12/05 0700) BP: (127-161)/(55-80) 144/55 mmHg (12/05 0700) SpO2:  [94 %-98 %] 97 % (12/05 0700) Weight:  [66.407 kg (146 lb 6.4 oz)] 66.407 kg (146 lb 6.4 oz) (12/04 2254)  Intake/Output from previous day:   Intake/Output this shift:    Physical Exam: Nonfocal neuro exam.  No difficulties with speech.  Arm more comfortable in sling.  Lab Results: No results for input(s): WBC, HGB, HCT, PLT in the last 72 hours. BMET No results for input(s): NA, K, CL, CO2, GLUCOSE, BUN, CREATININE, CALCIUM in the last 72 hours.  Studies/Results: Dg Elbow Complete Left  02/26/2014   CLINICAL DATA:  Status post fall while walking in the neighborhood. Left posterior elbow pain. Initial encounter.  EXAM: LEFT ELBOW - COMPLETE 3+ VIEW  COMPARISON:  None.  FINDINGS: There is a nondisplaced fracture of the left radial head involving the articular surface with a 1 mm step-off. There is a moderate joint effusion. There is no other acute fracture or dislocation. There is no evidence of arthropathy or other focal bone abnormality. Soft tissues are unremarkable.  IMPRESSION: Nondisplaced fracture of the left radial head involving the articular surface.   Electronically Signed   By: Kathreen Devoid   On: 02/26/2014 18:47   Ct Head Wo Contrast  02/27/2014   CLINICAL DATA:  Recent fall, intracranial hemorrhage. Subsequent encounter.  EXAM: CT HEAD WITHOUT CONTRAST  TECHNIQUE: Contiguous axial images were obtained from the base of the skull through the vertex without contrast.  COMPARISON:  02/26/2014  FINDINGS: Stable anterior medial left frontal 15 mm cortical hyperdense contusion. Similar pattern of scattered bilateral subarachnoid blood layering within sulci. Trace amount of midline parafalcine  Subdural hemorrhage. No significant interval change. Stable brain atrophy pattern without definite infarction, midline shift, herniation, hydrocephalus. Gray-white matter differentiation maintained. No cerebellar abnormality. Atherosclerosis of the intracranial vessels. Orbits are symmetric. Mastoids and sinuses remain clear with a small left maxillary retention cyst measuring 7 mm posteriorly. No skull fracture evident.  IMPRESSION: Stable acute anterior medial left frontal cortical contusion.  Stable scattered small amount of subarachnoid and midline parafalcine subdural hemorrhage.   Electronically Signed   By: Daryll Brod M.D.   On: 02/27/2014 08:02   Ct Head Wo Contrast  02/26/2014   CLINICAL DATA:  Patient slipped and fell while walking in neighborhood, has gash above left eye, left frontal headache  EXAM: CT HEAD WITHOUT CONTRAST  TECHNIQUE: Contiguous axial images were obtained from the base of the skull through the vertex without intravenous contrast.  COMPARISON:  11/30/2008  FINDINGS: No skull fracture. Focal soft tissue defect over the left frontal bone. In the anterior tip of the left frontal lobe there is a 15 mm cortical and a subcortical focus of hyperattenuation consistent with contusion. There is a very small volume of acute blood in the more inferior anterior falx. There is a tiny volume of acute subdural blood in the dural reflections at the base of the brain.  Moderate diffuse age-related atrophy. No evidence of mass, infarct, or hydrocephalus. No midline shift.  IMPRESSION: Acute left frontal lobe contusion with very small volume acute multifocal subdural hematoma. Critical Value/emergent results were called by telephone at the time of interpretation on 02/26/2014 at 6:09 pm to Dr. Carmin Muskrat ,  who verbally acknowledged these results.   Electronically Signed   By: Skipper Cliche M.D.   On: 02/26/2014 18:09    Assessment/Plan: Repeat head CT stable.  OK to D/C home.  F/U with me in  office in one month with repeat Head CT.    LOS: 1 day    Peggyann Shoals, MD 02/27/2014, 9:02 AM

## 2014-03-01 ENCOUNTER — Telehealth: Payer: Self-pay | Admitting: Family Medicine

## 2014-03-01 NOTE — Telephone Encounter (Signed)
Requesting to speak to Courtney Castro regardig her CPE appt on this Thursady (12/10). Insit this is a conversation for Courtney Castro only. She can be reached at home phone.

## 2014-03-01 NOTE — Telephone Encounter (Signed)
Spoke with patient.

## 2014-03-04 ENCOUNTER — Ambulatory Visit (INDEPENDENT_AMBULATORY_CARE_PROVIDER_SITE_OTHER): Payer: Medicare Other | Admitting: Family Medicine

## 2014-03-04 ENCOUNTER — Encounter: Payer: Self-pay | Admitting: Family Medicine

## 2014-03-04 VITALS — BP 130/70 | HR 60 | Ht 65.0 in | Wt 146.0 lb

## 2014-03-04 DIAGNOSIS — Z0000 Encounter for general adult medical examination without abnormal findings: Secondary | ICD-10-CM

## 2014-03-04 DIAGNOSIS — Z5181 Encounter for therapeutic drug level monitoring: Secondary | ICD-10-CM

## 2014-03-04 DIAGNOSIS — Z23 Encounter for immunization: Secondary | ICD-10-CM

## 2014-03-04 DIAGNOSIS — R7301 Impaired fasting glucose: Secondary | ICD-10-CM

## 2014-03-04 DIAGNOSIS — S0990XD Unspecified injury of head, subsequent encounter: Secondary | ICD-10-CM

## 2014-03-04 DIAGNOSIS — E782 Mixed hyperlipidemia: Secondary | ICD-10-CM

## 2014-03-04 DIAGNOSIS — I10 Essential (primary) hypertension: Secondary | ICD-10-CM

## 2014-03-04 DIAGNOSIS — E039 Hypothyroidism, unspecified: Secondary | ICD-10-CM

## 2014-03-04 LAB — POCT URINALYSIS DIPSTICK
Bilirubin, UA: NEGATIVE
Glucose, UA: NEGATIVE
KETONES UA: NEGATIVE
LEUKOCYTES UA: NEGATIVE
Protein, UA: NEGATIVE
Spec Grav, UA: 1.02
Urobilinogen, UA: NEGATIVE
pH, UA: 6

## 2014-03-04 LAB — CBC WITH DIFFERENTIAL/PLATELET
Basophils Absolute: 0 10*3/uL (ref 0.0–0.1)
Basophils Relative: 0 % (ref 0–1)
EOS PCT: 3 % (ref 0–5)
Eosinophils Absolute: 0.2 10*3/uL (ref 0.0–0.7)
HCT: 41.8 % (ref 36.0–46.0)
HEMOGLOBIN: 14.3 g/dL (ref 12.0–15.0)
LYMPHS ABS: 1.2 10*3/uL (ref 0.7–4.0)
Lymphocytes Relative: 16 % (ref 12–46)
MCH: 29.5 pg (ref 26.0–34.0)
MCHC: 34.2 g/dL (ref 30.0–36.0)
MCV: 86.2 fL (ref 78.0–100.0)
MPV: 11.4 fL (ref 9.4–12.4)
Monocytes Absolute: 0.8 10*3/uL (ref 0.1–1.0)
Monocytes Relative: 10 % (ref 3–12)
NEUTROS ABS: 5.4 10*3/uL (ref 1.7–7.7)
Neutrophils Relative %: 71 % (ref 43–77)
Platelets: 252 10*3/uL (ref 150–400)
RBC: 4.85 MIL/uL (ref 3.87–5.11)
RDW: 13.1 % (ref 11.5–15.5)
WBC: 7.6 10*3/uL (ref 4.0–10.5)

## 2014-03-04 LAB — COMPREHENSIVE METABOLIC PANEL
ALT: 18 U/L (ref 0–35)
AST: 23 U/L (ref 0–37)
Albumin: 4.2 g/dL (ref 3.5–5.2)
Alkaline Phosphatase: 82 U/L (ref 39–117)
BILIRUBIN TOTAL: 1.8 mg/dL — AB (ref 0.2–1.2)
BUN: 22 mg/dL (ref 6–23)
CO2: 30 mEq/L (ref 19–32)
Calcium: 10.2 mg/dL (ref 8.4–10.5)
Chloride: 100 mEq/L (ref 96–112)
Creat: 0.72 mg/dL (ref 0.50–1.10)
GLUCOSE: 99 mg/dL (ref 70–99)
Potassium: 3.9 mEq/L (ref 3.5–5.3)
Sodium: 141 mEq/L (ref 135–145)
Total Protein: 6.2 g/dL (ref 6.0–8.3)

## 2014-03-04 LAB — LIPID PANEL
CHOL/HDL RATIO: 3.3 ratio
CHOLESTEROL: 165 mg/dL (ref 0–200)
HDL: 50 mg/dL (ref >39)
LDL Cholesterol: 88 mg/dL (ref 0–99)
TRIGLYCERIDES: 134 mg/dL (ref <150)
VLDL: 27 mg/dL (ref 0–40)

## 2014-03-04 MED ORDER — SIMVASTATIN 20 MG PO TABS: 20.0000 mg | ORAL_TABLET | Freq: Every day | ORAL | Status: DC

## 2014-03-04 NOTE — Patient Instructions (Signed)
  HEALTH MAINTENANCE RECOMMENDATIONS:  It is recommended that you get at least 30 minutes of aerobic exercise at least 5 days/week (for weight loss, you may need as much as 60-90 minutes). This can be any activity that gets your heart rate up. This can be divided in 10-15 minute intervals if needed, but try and build up your endurance at least once a week.  Weight bearing exercise is also recommended twice weekly.  Eat a healthy diet with lots of vegetables, fruits and fiber.  "Colorful" foods have a lot of vitamins (ie green vegetables, tomatoes, red peppers, etc).  Limit sweet tea, regular sodas and alcoholic beverages, all of which has a lot of calories and sugar.  Up to 1 alcoholic drink daily may be beneficial for women (unless trying to lose weight, watch sugars).  Drink a lot of water.  Calcium recommendations are 1200-1500 mg daily (1500 mg for postmenopausal women or women without ovaries), and vitamin D 1000 IU daily.  This should be obtained from diet and/or supplements (vitamins), and calcium should not be taken all at once, but in divided doses.  Monthly self breast exams and yearly mammograms for women over the age of 73 is recommended.  Sunscreen of at least SPF 30 should be used on all sun-exposed parts of the skin when outside between the hours of 10 am and 4 pm (not just when at beach or pool, but even with exercise, golf, tennis, and yard work!)  Use a sunscreen that says "broad spectrum" so it covers both UVA and UVB rays, and make sure to reapply every 1-2 hours.  Remember to change the batteries in your smoke detectors when changing your clock times in the spring and fall.  Use your seat belt every time you are in a car, and please drive safely and not be distracted with cell phones and texting while driving.  Switch to using tylenol in place of aleve, if needed for pain or headache.  Dr. Rhona Raider was the orthopedist who saw you in the hospital.

## 2014-03-04 NOTE — Progress Notes (Signed)
Chief Complaint  Patient presents with  . Annual Exam    fasting annual exam (blood in lab) with pap. Saw Dr.Tomlin but would like to switch care of GYN over to here. Last pap 2011.  Has a mole that was frozen on her left abd area-never fell off, can you look at today of you have time. Inquiring on whether or not she needs Prevar 13. Did not do eye exam as she just had one and is going back to see doctor in January.   Courtney Castro is a 80 y.o. female who presents for a complete physical and follow up on chronic problems (med check).  She has the following concerns:  She was admitted 12/4-07/2013 to the hospital with traumatic brain injury after fall, with SAH and SDH, along with left radial head fracture after a fall.  She was seen by Dr. Vertell Limber and Dr. Rhona Raider (only treating with sling).. She had been walking with her husband in the neighborhood and tripped. She reports that her left arm is doing much better, can almost completely straighten it now.  Has f/u with both doctors planned.  HTN: Blood pressure isn't checked regularly, but when she occasionally does (at another doctor) it is always normal. Denies dizziness or headaches, muscle cramps, edema. Compliant with medications.   Hyperlipidemia follow-up: Patient is reportedly following a low-fat, low cholesterol diet. Compliant with medications and denies medication side effects   Hypothyroidism: She denies any symptoms related to thyroid; compliant with taking her medication, taking it on an empty stomach. She separates the thyroid medication from her calcium and vitamins by at least 2 hours.   Allergies: worse in the Spring, but has some symptoms now as well, worse towards the end of the day. She has been using the [facility] and [facility].  Immunization History  Administered Date(s) Administered  . Influenza Split 12/11/2009, 01/02/2011, 12/21/2011  . Influenza, High Dose Seasonal PF 12/05/2012, 12/17/2013  . Pneumococcal  Polysaccharide-23 11/07/2001, 01/31/2011  . Td 12/18/2004  . Tdap 01/31/2011  . Zoster 08/25/2007   Last Pap smear: 2011, s/p hysterectomy for benign reasons  Last mammogram: 12/2013 Last colonoscopy: 2007 [facility]) and again at June 2014 with Dr. Watt Climes. Normal per pt, repeat in 5 years Last DEXA: 10/09 (normal; at [facility])  Dentist: twice yearly  Ophtho: twice yearly  Exercise: walking at least 5 days/week, 25-30 minutes. She has some resistance bands and weights, but isn't using them regularly.  Past Medical History  Diagnosis Date  . Hypertension   . Hyperlipidemia   . Hyperthyroidism   . Allergy     seasonal allergies  . IBS (irritable bowel syndrome) 1984  . Colon polyp 09/2000  . Macular degeneration 2008    mild  . Basal cell carcinoma 2007, 2015    face and shoulder (Dr. Derrel Nip)  . Diverticulosis     seen on colonoscopy  . Breast cancer 1989    R breast-microinvasive papillary(lumpectomy and XRT)  . Subdural hematoma 06/2008    Dr.Cram  . Elevated liver function tests 06/2009    galllstones and dilated CBD--s/p ERCP and chole  . Gilbert syndrome   . Subdural hematoma 02/2014    after fall (Dr. Vertell Limber)  . Subarachnoid hemorrhage 02/2014    after fall    Past Surgical History  Procedure Laterality Date  . Abdominal hysterectomy      bladder repair  . Breast lumpectomy Right 1989    right breast  . Total knee arthroplasty  Right 2000  . Total knee arthroplasty Left 2004    Dr. Wynelle Link  . Hammer toe surgery Right 05/2004    right foot  . Fracture left foot  01/2007    5th metatarsal-treated with boot  . Tubal ligation    . Ercp  07/2009    sphincterotomy  . Laparoscopic cholecystectomy  07/2009    History   Social History  . Marital Status: Married    Spouse Name: N/A    Number of Children: 2  . Years of Education: N/A   Occupational History  .     Social History Main Topics  . Smoking status: Former Smoker -- 0.10  packs/day for 3 years    Quit date: 03/27/1963  . Smokeless tobacco: Never Used  . Alcohol Use: Yes     Comment: 1 glass of wine per week.  . Drug Use: No  . Sexual Activity:    Partners: Male   Other Topics Concern  . Not on file   Social History Narrative   Retired Statistician.  1 daughter in [place] ([name]), and 1 daughter in [place]). 4 granddaughters    Family History  Problem Relation Age of Onset  . Leukemia Mother   . Ovarian cancer Mother     ??? hysterectomy age 63's  . Cancer Mother     leukemia; possible ovarian cancer (hysterectomy in her 45's)  . Coronary artery disease Father   . Hypertension Father   . Heart disease Father     onset late 47's  . Coronary artery disease Brother 60    coronary artery bypass grafting, pacemaker  . Heart disease Brother     CABG in 60's, pacemaker  . Breast cancer Maternal Aunt   . Cancer Maternal Aunt     breast  . Diabetes Paternal Uncle   . Diabetes Paternal Grandmother   . Cancer Cousin     breast  . Breast cancer Cousin   . Breast cancer Daughter 93    Outpatient Encounter Prescriptions as of 03/04/2014  Medication Sig Note  . aspirin 81 MG tablet Take 81 mg by mouth daily.   Marland Kitchen atenolol (TENORMIN) 25 MG tablet TAKE 1 TABLET (25 MG TOTAL) BY MOUTH DAILY.   . Calcium Carbonate-Vitamin D (CALTRATE 600+D) 600-400 MG-UNIT per tablet Take 1 tablet by mouth 2 (two) times daily.     . Cholecalciferol (VITAMIN D) 2000 UNITS tablet Take 2,000 Units by mouth daily.     . fexofenadine (ALLEGRA) 180 MG tablet Take 180 mg by mouth daily.     . fluticasone (FLONASE) 50 MCG/ACT nasal spray PLACE 2 SPRAYS INTO THE NOSE DAILY.   Marland Kitchen GLUCOSAMINE PO Take 2,000 mg by mouth 2 (two) times daily.     Marland Kitchen levothyroxine (SYNTHROID, LEVOTHROID) 25 MCG tablet TAKE 1 TABLET (25 MCG TOTAL) BY MOUTH DAILY.   Marland Kitchen losartan-hydrochlorothiazide (HYZAAR) 100-25 MG per tablet TAKE 1 TABLET BY MOUTH DAILY.   . Multiple Vitamins-Minerals (CENTRUM  SILVER PO) Take 1 tablet by mouth daily.     . Multiple Vitamins-Minerals (PRESERVISION/LUTEIN) CAPS Take 1 capsule by mouth 2 (two) times daily.     . naproxen sodium (ANAPROX) 220 MG tablet Take 220 mg by mouth as needed. 03/04/2014: Uses prn--taking recently for headache since fall  . simvastatin (ZOCOR) 20 MG tablet Take 1 tablet (20 mg total) by mouth at bedtime.   . [DISCONTINUED] simvastatin (ZOCOR) 20 MG tablet Take 1 tablet (20 mg total) by mouth  at bedtime.     Allergies  Allergen Reactions  . Percocet [Oxycodone-Acetaminophen] Other (See Comments)    Upset stomach   ROS: The patient denies anorexia, fever, weight changes, headaches (slight, where she banged her head), vision changes, decreased hearing, ear pain, sore throat, breast concerns, chest pain, palpitations, dizziness, syncope, dyspnea on exertion, swelling, nausea, vomiting, diarrhea, constipation, abdominal pain, melena, hematochezia, indigestion/heartburn, hematuria, vaginal bleeding, genital lesions, joint pains (some at left arm/elbow, improving, s/p fracture), numbness, tingling, weakness, tremor, suspicious skin lesions, depression, anxiety, abnormal bleeding/bruising, or enlarged lymph nodes.  She denies any urinary complaints, incontinence.  PHYSICAL EXAM  BP 130/70 mmHg  Pulse 60  Ht 5\' 5"  (1.651 m)  Wt 146 lb (66.225 kg)  BMI 24.30 kg/m2  General Appearance:  Alert, cooperative, no distress, appears stated age   Head:  Normocephalic; she has ecchymosis involving the left eye (above and below).  Laceration L forehead repaired with glue--no bleeding, edges approximated.  Soft tissue swelling at left cheek, over maxilla, with ecchymosis.  Eyes:  PERRL, conjunctiva/corneas clear, EOM's intact, fundi  benign   Ears:  Normal TM's and external ear canals   Nose:  Nares normal, mucosa is mildly edematous, with some clear mucus and crust, sinuses nontender   Throat:  Lips, mucosa, and tongue normal;  teeth and gums normal   Neck:  Supple, no lymphadenopathy; thyroid: no enlargement/tenderness/nodules; no carotid  bruit or JVD   Back:  Spine nontender, no curvature, ROM normal, no CVA Tenderness.   Lungs:  Clear to auscultation bilaterally without wheezes, rales or ronchi; respirations unlabored   Chest Wall:  No tenderness or deformity   Heart:  Regular rate and rhythm, S1 and S2 normal, no murmur, rub  or gallop   Breast Exam:  No tenderness, masses, or nipple discharge or inversion. No axillary lymphadenopathy. WHSS R breast at 9 o'clock. Left breast larger than right.   Abdomen:  Soft, non-tender, nondistended, normoactive bowel sounds,  no masses, no hepatosplenomegaly   Genitalia:  Normal external genitalia without lesions. BUS and vagina normal; Adnexa not palpable. Uterus surgically absent. No masses or tenderness. Pap not performed   Rectal:  Normal tone, no masses or tenderness; guaiac negative stool   Extremities:  No clubbing, cyanosis or edema   Pulses:  2+ and symmetric all extremities   Skin:  Skin color, texture, turgor normal, no rashes or lesions. Many seborrheic keratosis throughout, especially on chest/abdomen  Lymph nodes:  Cervical, supraclavicular, and axillary nodes normal   Neurologic:  CNII-XII intact, normal strength, sensation and gait; reflexes 2+ and symmetric throughout    Psych: Normal mood, affect, hygiene and grooming.    ASSESSMENT/PLAN:  Annual physical exam - Plan: POCT Urinalysis Dipstick  Essential hypertension, benign - controlled - Plan: Comprehensive metabolic panel, CBC with Differential  Hypothyroidism, unspecified hypothyroidism type - euthyroid by history  Mixed hyperlipidemia - Plan: Lipid panel, simvastatin (ZOCOR) 20 MG tablet  Impaired fasting glucose - Plan: Comprehensive metabolic panel, Hemoglobin A1c  Immunization due - Plan: Pneumococcal conjugate vaccine  13-valent  Medication monitoring encounter - Plan: Lipid panel, Comprehensive metabolic panel, CBC with Differential  Head injury, subsequent encounter - with SAH and SDH, s/p overnight observation and stable. Normal neuro exam.  f/u with neurosurg as scheduled.    Discussed monthly self breast exams and yearly mammograms; at least 30 minutes of aerobic activity at least 5 days/week, weight-bearing exercise 2/week; proper sunscreen use reviewed; healthy diet, including goals of calcium and vitamin D  intake and alcohol recommendations (less than or equal to 1 drink/day) reviewed; regular seatbelt use; changing batteries in smoke detectors. Immunization recommendations discussed--Prevnar-13 today Flu shots annually. Colonoscopy UTD  Patient has living will, healthcare power of attorney.  Reassured re: lesion L abdomen that didn't fall off after liquid nitrogen treatment--can wait until next visit  F/u 6 months, sooner prn

## 2014-03-05 ENCOUNTER — Encounter: Payer: Self-pay | Admitting: Family Medicine

## 2014-03-05 LAB — HEMOGLOBIN A1C
Hgb A1c MFr Bld: 5.9 % — ABNORMAL HIGH (ref <5.7)
Mean Plasma Glucose: 123 mg/dL — ABNORMAL HIGH (ref <117)

## 2014-04-05 DIAGNOSIS — H1859 Other hereditary corneal dystrophies: Secondary | ICD-10-CM | POA: Diagnosis not present

## 2014-04-07 DIAGNOSIS — S52125D Nondisplaced fracture of head of left radius, subsequent encounter for closed fracture with routine healing: Secondary | ICD-10-CM | POA: Diagnosis not present

## 2014-04-12 ENCOUNTER — Other Ambulatory Visit: Payer: Self-pay | Admitting: Neurosurgery

## 2014-04-12 DIAGNOSIS — S065XAA Traumatic subdural hemorrhage with loss of consciousness status unknown, initial encounter: Secondary | ICD-10-CM

## 2014-04-12 DIAGNOSIS — S065X9A Traumatic subdural hemorrhage with loss of consciousness of unspecified duration, initial encounter: Secondary | ICD-10-CM

## 2014-04-13 ENCOUNTER — Ambulatory Visit
Admission: RE | Admit: 2014-04-13 | Discharge: 2014-04-13 | Disposition: A | Discharge status: Home / Self Care | Payer: Medicare Other | Source: Ambulatory Visit | Attending: Neurosurgery | Admitting: Neurosurgery

## 2014-04-13 DIAGNOSIS — S0990XA Unspecified injury of head, initial encounter: Secondary | ICD-10-CM | POA: Diagnosis not present

## 2014-04-13 DIAGNOSIS — S065XAA Traumatic subdural hemorrhage with loss of consciousness status unknown, initial encounter: Secondary | ICD-10-CM

## 2014-04-13 DIAGNOSIS — S065X9A Traumatic subdural hemorrhage with loss of consciousness of unspecified duration, initial encounter: Secondary | ICD-10-CM

## 2014-04-26 DIAGNOSIS — I10 Essential (primary) hypertension: Secondary | ICD-10-CM | POA: Diagnosis not present

## 2014-04-26 DIAGNOSIS — I609 Nontraumatic subarachnoid hemorrhage, unspecified: Secondary | ICD-10-CM | POA: Diagnosis not present

## 2014-04-26 DIAGNOSIS — I6200 Nontraumatic subdural hemorrhage, unspecified: Secondary | ICD-10-CM | POA: Diagnosis not present

## 2014-04-29 ENCOUNTER — Other Ambulatory Visit: Payer: Self-pay | Admitting: Family Medicine

## 2014-05-06 ENCOUNTER — Other Ambulatory Visit: Payer: Self-pay | Admitting: Family Medicine

## 2014-05-13 DIAGNOSIS — H2513 Age-related nuclear cataract, bilateral: Secondary | ICD-10-CM | POA: Diagnosis not present

## 2014-05-13 DIAGNOSIS — D3132 Benign neoplasm of left choroid: Secondary | ICD-10-CM | POA: Diagnosis not present

## 2014-05-13 DIAGNOSIS — H3531 Nonexudative age-related macular degeneration: Secondary | ICD-10-CM | POA: Diagnosis not present

## 2014-05-13 DIAGNOSIS — H1852 Epithelial (juvenile) corneal dystrophy: Secondary | ICD-10-CM | POA: Diagnosis not present

## 2014-05-14 DIAGNOSIS — L84 Corns and callosities: Secondary | ICD-10-CM | POA: Diagnosis not present

## 2014-07-12 DIAGNOSIS — H1859 Other hereditary corneal dystrophies: Secondary | ICD-10-CM | POA: Diagnosis not present

## 2014-07-21 ENCOUNTER — Ambulatory Visit (INDEPENDENT_AMBULATORY_CARE_PROVIDER_SITE_OTHER): Payer: Medicare Other | Admitting: Family Medicine

## 2014-07-21 ENCOUNTER — Encounter: Payer: Self-pay | Admitting: Family Medicine

## 2014-07-21 VITALS — BP 130/70 | HR 72 | Temp 97.8°F | Ht 65.0 in | Wt 146.6 lb

## 2014-07-21 DIAGNOSIS — N3000 Acute cystitis without hematuria: Secondary | ICD-10-CM

## 2014-07-21 DIAGNOSIS — R3915 Urgency of urination: Secondary | ICD-10-CM | POA: Diagnosis not present

## 2014-07-21 LAB — POCT URINALYSIS DIPSTICK
BILIRUBIN UA: NEGATIVE
Blood, UA: NEGATIVE
Glucose, UA: NEGATIVE
Ketones, UA: NEGATIVE
Nitrite, UA: NEGATIVE
Protein, UA: NEGATIVE
SPEC GRAV UA: 1.015
Urobilinogen, UA: NEGATIVE
pH, UA: 6

## 2014-07-21 MED ORDER — NITROFURANTOIN MONOHYD MACRO 100 MG PO CAPS: 100.0000 mg | ORAL_CAPSULE | Freq: Two times a day (BID) | ORAL | Status: DC

## 2014-07-21 NOTE — Patient Instructions (Signed)

## 2014-07-21 NOTE — Progress Notes (Signed)
Chief Complaint  Patient presents with  . Urinary Frequency    and urgency as well as abd pain since Monday. UA showed 1+ leuks.    She is complaining of feeling bloated in lower abdomen.  She feels like she needs to void, but nothing comes, and then she seems to go very frequently, every 30 minutes.  Denies any dysuria; notes a stronger odor than usual.  Denies hematuria.  Denies any vaginal discharge, odor, itch.  She had been away over the weekend, had a "wild" bowel movement after returning, but stools have been normal since.  Review of chart shows that last UTI was 02/2013.  It was E.coli, and was resistant to sulfa, cipro, levaquin, ampicillin.  Sensitive to macrobid and cephalosporins  PMH, PSH SH reviewed.  Outpatient Encounter Prescriptions as of 07/21/2014  Medication Sig Note  . aspirin 81 MG tablet Take 81 mg by mouth daily.   Marland Kitchen atenolol (TENORMIN) 25 MG tablet TAKE 1 TABLET (25 MG TOTAL) BY MOUTH DAILY.   . Calcium Carbonate-Vitamin D (CALTRATE 600+D) 600-400 MG-UNIT per tablet Take 1 tablet by mouth 2 (two) times daily.     . Cholecalciferol (VITAMIN D) 2000 UNITS tablet Take 2,000 Units by mouth daily.     . fexofenadine (ALLEGRA) 180 MG tablet Take 180 mg by mouth daily.     . fluticasone (FLONASE) 50 MCG/ACT nasal spray PLACE 2 SPRAYS INTO THE NOSE DAILY.   Marland Kitchen GLUCOSAMINE PO Take 2,000 mg by mouth 2 (two) times daily.     Marland Kitchen levothyroxine (SYNTHROID, LEVOTHROID) 25 MCG tablet TAKE 1 TABLET (25 MCG TOTAL) BY MOUTH DAILY.   Marland Kitchen losartan-hydrochlorothiazide (HYZAAR) 100-25 MG per tablet TAKE 1 TABLET BY MOUTH DAILY.   . Multiple Vitamins-Minerals (CENTRUM SILVER PO) Take 1 tablet by mouth daily.     . Multiple Vitamins-Minerals (PRESERVISION/LUTEIN) CAPS Take 1 capsule by mouth 2 (two) times daily.     . simvastatin (ZOCOR) 20 MG tablet Take 1 tablet (20 mg total) by mouth at bedtime.   . naproxen sodium (ANAPROX) 220 MG tablet Take 220 mg by mouth as needed. 03/04/2014: Uses  prn--taking recently for headache since fall   Allergies  Allergen Reactions  . Percocet [Oxycodone-Acetaminophen] Other (See Comments)    Upset stomach   ROS: Denies fevers, chills, flank pain.  She had slight pain in her back only when she woke up this morning.  Denies nausea, vomiting.  No chest pain, shortness of breath, headache, dizziness or other complaints  PHYSICAL EXAM: BP 130/70 mmHg  Pulse 72  Temp(Src) 97.8 F (36.6 C) (Tympanic)  Ht 5\' 5"  (1.651 m)  Wt 146 lb 9.6 oz (66.497 kg)  BMI 24.40 kg/m2  Well developed, pleasant female in no distress Back: no CVA tenderness Abdomen: mildly tender over suprapubic area.  Remainder of abdomen is nontender. No rebound, guarding or mass  Urine dip: 1+ leuks  ASSESSMENT/PLAN:  Acute cystitis without hematuria - Plan: Urine culture, nitrofurantoin, macrocrystal-monohydrate, (MACROBID) 100 MG capsule  Urinary urgency - Plan: POCT Urinalysis Dipstick  F/u prn persistance/worsening of symptoms

## 2014-07-24 LAB — URINE CULTURE

## 2014-07-26 ENCOUNTER — Telehealth: Payer: Self-pay | Admitting: *Deleted

## 2014-07-26 NOTE — Telephone Encounter (Signed)
Spoke with patient to let her know culture was back and was sensitive to macrobid. Patient reports that she is feeling better and not having any side effects from medication.

## 2014-08-06 ENCOUNTER — Other Ambulatory Visit: Payer: Self-pay | Admitting: Family Medicine

## 2014-08-12 DIAGNOSIS — L84 Corns and callosities: Secondary | ICD-10-CM | POA: Diagnosis not present

## 2014-09-09 ENCOUNTER — Encounter: Payer: Self-pay | Admitting: Family Medicine

## 2014-09-09 ENCOUNTER — Ambulatory Visit (INDEPENDENT_AMBULATORY_CARE_PROVIDER_SITE_OTHER): Payer: Medicare Other | Admitting: Family Medicine

## 2014-09-09 VITALS — BP 130/72 | HR 60 | Ht 65.0 in | Wt 142.0 lb

## 2014-09-09 DIAGNOSIS — I10 Essential (primary) hypertension: Secondary | ICD-10-CM

## 2014-09-09 DIAGNOSIS — Z5181 Encounter for therapeutic drug level monitoring: Secondary | ICD-10-CM

## 2014-09-09 DIAGNOSIS — E782 Mixed hyperlipidemia: Secondary | ICD-10-CM | POA: Diagnosis not present

## 2014-09-09 DIAGNOSIS — E039 Hypothyroidism, unspecified: Secondary | ICD-10-CM

## 2014-09-09 DIAGNOSIS — M542 Cervicalgia: Secondary | ICD-10-CM

## 2014-09-09 DIAGNOSIS — R7301 Impaired fasting glucose: Secondary | ICD-10-CM | POA: Diagnosis not present

## 2014-09-09 LAB — POCT GLYCOSYLATED HEMOGLOBIN (HGB A1C): HEMOGLOBIN A1C: 5.4

## 2014-09-09 NOTE — Patient Instructions (Signed)
Your sugars overall are much better; A1c is now within the normal range.  I wold love to see you getting into a regular exercise routine. Your blood pressure is well controlled. Continue your current medications--we will send in the refills tomorrow assuming that your labs are all normal.  We will call you if the medications need to be changed.   Consider trying a contoured cervical pillow. Also try taking Tylenol Arthritis at bedtime (and again in the morning, if needed). Try doing the stretches and range of motion exercises after (or during) a hot shower (or after heat).   It is recommended that you get at least 30 minutes of aerobic exercise at least 5 days/week (for weight loss, you may need as much as 60-90 minutes). This can be any activity that gets your heart rate up. This can be divided in 10-15 minute intervals if needed, but try and build up your endurance at least once a week.  Weight bearing exercise is also recommended twice weekly.

## 2014-09-09 NOTE — Progress Notes (Signed)
Chief Complaint  Patient presents with  . Hypertension    fasting med check.    She is complaining of aching in her posterior neck, extending around to the back of both ears.  She notices this when she first wakes up in the morning.  Sometimes has discomfort with turning her head side to side.  She has noticed this just over the last month or so. She took tylenol once, which helped some.  For 3 days in a row she took one aleve at bedtime, which also helped. Denies any radiation into the arms, or numbness, tingling, weakness.  UTI in April--symptoms completely resolved with treatment, and denies any recurrent symptoms.  HTN: Blood pressure isn't checked regularly, has a monitor, but it is always normal so she stopped checking.  Denies dizziness or headaches, muscle cramps, edema. Compliant with medications.   Hyperlipidemia follow-up: Patient is reportedly following a low-fat, low cholesterol diet. Compliant with medications and denies medication side effects   Hypothyroidism: She denies any symptoms related to thyroid; compliant with taking her medication, taking it on an empty stomach. She separates the thyroid medication from her calcium and vitamins by at least 2 hours.   Impaired fasting glucose:  A1c 6 months ago was 5.9 (6.0 one year ago), glucose was 99.  She stopped eating ice cream and has been monitoring her sugar intake and sweets. She has lost 4 pounds since her last visit. She admits to not getting regular exercise.  Allergies: worse in the Spring than the fall.  She has some congestion and occasional dull sinus headache. She has been using the [facility] and [facility].  PMH, PSH, SH and FH was reviewed and updated  Outpatient Encounter Prescriptions as of 09/09/2014  Medication Sig Note  . aspirin 81 MG tablet Take 81 mg by mouth daily.   Marland Kitchen atenolol (TENORMIN) 25 MG tablet TAKE 1 TABLET (25 MG TOTAL) BY MOUTH DAILY.   . Calcium Carbonate-Vitamin D (CALTRATE 600+D) 600-400 MG-UNIT  per tablet Take 1 tablet by mouth 2 (two) times daily.     . Cholecalciferol (VITAMIN D) 2000 UNITS tablet Take 2,000 Units by mouth daily.     . fexofenadine (ALLEGRA) 180 MG tablet Take 180 mg by mouth daily.     . fluticasone (FLONASE) 50 MCG/ACT nasal spray PLACE 2 SPRAYS INTO THE NOSE DAILY.   Marland Kitchen GLUCOSAMINE PO Take 2,000 mg by mouth 2 (two) times daily.     Marland Kitchen levothyroxine (SYNTHROID, LEVOTHROID) 25 MCG tablet TAKE 1 TABLET (25 MCG TOTAL) BY MOUTH DAILY.   Marland Kitchen losartan-hydrochlorothiazide (HYZAAR) 100-25 MG per tablet TAKE 1 TABLET BY MOUTH DAILY.   . Multiple Vitamins-Minerals (CENTRUM SILVER PO) Take 1 tablet by mouth daily.     . Multiple Vitamins-Minerals (PRESERVISION/LUTEIN) CAPS Take 1 capsule by mouth 2 (two) times daily.     . simvastatin (ZOCOR) 20 MG tablet Take 1 tablet (20 mg total) by mouth at bedtime.   . [DISCONTINUED] naproxen sodium (ANAPROX) 220 MG tablet Take 220 mg by mouth as needed. 03/04/2014: Uses prn--taking recently for headache since fall  . [DISCONTINUED] nitrofurantoin, macrocrystal-monohydrate, (MACROBID) 100 MG capsule Take 1 capsule (100 mg total) by mouth 2 (two) times daily.    No facility-administered encounter medications on file as of 09/09/2014.   Allergies  Allergen Reactions  . Percocet [Oxycodone-Acetaminophen] Other (See Comments)    Upset stomach   ROS:  Denies fever, chills, headaches, dizziness, chest pain, palpitations, shortness of breath, URI or cold symptoms, urinary  symptoms, edema, bleeding, bruising, rash or other complaints.  Moods are good, denies depression. No numbness, tingling, weakness.  Neck pain as per HPI.  PHYSICAL EXAM: BP 130/72 mmHg  Pulse 60  Ht 5\' 5"  (1.651 m)  Wt 142 lb (64.411 kg)  BMI 23.63 kg/m2  Well developed, pleasant female in no distress. talkative HEENT: PERRL, EOMI, conjunctiva clear.OP clear. Neck: no lymphadenopathy, thyromegaly or carotid bruit. No spinal tenderness.  FROM, muscles are nontender  without spasm Heart: regular rate and rhythm Lungs: clear bilaterally Abdomen: soft, nontender, no organomegaly or mass Back: no CVA or spinal tenderness Extremities: No edema, 2+ pulses Skin: superficial veins and telangiectasias and pigmented macules on her LE's.  No bruising or suspicious lesions Psych: normal mood, affect, hygiene and grooming Neuro: alert and oriented.  Normal strength, sensation, cranial nerves, gait, DTRs.  Lab Results  Component Value Date   HGBA1C 5.4 09/09/2014   ASSESSMENT/PLAN:  Essential hypertension, benign - controlled  IFG (impaired fasting glucose) - Plan: HgB A1c, Glucose, random  Hypothyroidism, unspecified hypothyroidism type - euthyroid by history - Plan: TSH  Mixed hyperlipidemia - Plan: Lipid panel  Posterior neck pain - instructed on heat, stretches, trial of contoured pillow. tylenol arthritis qHS and prn  Medication monitoring encounter - Plan: Hepatic function panel   Consider trying a contoured cervical pillow. Also try taking Tylenol Arthritis at bedtime (and again in the morning, if needed). Try doing the stretches and range of motion exercises after (or during) a hot shower (or after heat).  REFILL THYROID AND STATIN AFTER LABS--labs normal, refills done. Thyroid x 1 year Statin x 6 mos

## 2014-09-10 ENCOUNTER — Encounter: Payer: Self-pay | Admitting: Family Medicine

## 2014-09-10 LAB — LIPID PANEL
Cholesterol: 169 mg/dL (ref 0–200)
HDL: 52 mg/dL (ref >=46)
LDL Cholesterol: 90 mg/dL (ref 0–99)
TRIGLYCERIDES: 136 mg/dL (ref <150)
Total CHOL/HDL Ratio: 3.3 Ratio
VLDL: 27 mg/dL (ref 0–40)

## 2014-09-10 LAB — HEPATIC FUNCTION PANEL
ALT: 15 U/L (ref 0–35)
AST: 23 U/L (ref 0–37)
Albumin: 4.2 g/dL (ref 3.5–5.2)
Alkaline Phosphatase: 61 U/L (ref 39–117)
BILIRUBIN INDIRECT: 1.6 mg/dL — AB (ref 0.2–1.2)
Bilirubin, Direct: 0.3 mg/dL (ref 0.0–0.3)
Total Bilirubin: 1.9 mg/dL — ABNORMAL HIGH (ref 0.2–1.2)
Total Protein: 6.1 g/dL (ref 6.0–8.3)

## 2014-09-10 LAB — GLUCOSE, RANDOM: Glucose, Bld: 93 mg/dL (ref 70–99)

## 2014-09-10 LAB — TSH: TSH: 1.043 u[IU]/mL (ref 0.350–4.500)

## 2014-09-10 MED ORDER — LEVOTHYROXINE SODIUM 25 MCG PO TABS: ORAL_TABLET | ORAL | Status: DC

## 2014-09-10 MED ORDER — SIMVASTATIN 20 MG PO TABS: 20.0000 mg | ORAL_TABLET | Freq: Every day | ORAL | Status: DC

## 2014-09-30 ENCOUNTER — Encounter: Payer: Self-pay | Admitting: Internal Medicine

## 2014-09-30 ENCOUNTER — Telehealth: Payer: Self-pay | Admitting: Internal Medicine

## 2014-09-30 NOTE — Telephone Encounter (Signed)
Pt states she is 44 and she is not sure she really needs one. She wants to hold off on getting this done as she doesn't really want to call her insurance as "its a pain in the butt" and will talk to Dr.Knapp at her appointment in December to see if she really needs one.

## 2014-09-30 NOTE — Telephone Encounter (Signed)
Called and left detailed message with patient about seeing if she has had a bone density done since 2009. If not did she want one. If she wants one, she would need to call her insurance to see if they would cover it. If she did not want one to just call us back and let us know.

## 2014-09-30 NOTE — Telephone Encounter (Signed)
[facility] reached out for closing the gaps for Osteoporosis measures and they said that pt had had not closed the gap for completing dexa scan. Is patient due to have dexa scan done?

## 2014-09-30 NOTE — Telephone Encounter (Signed)
She reportedly had a normal DEXA at [facility] in 2009.  She has had a nondisplaced radial head fracture in 02/2014, which was related to a fall.  Given a normal (reportedly--I don't see any results in computer) DEXA at 25, I don't always regularly repeat them.  If insurance will cover it, I don't have a problem with her having another one.

## 2014-11-04 ENCOUNTER — Other Ambulatory Visit: Payer: Self-pay | Admitting: Family Medicine

## 2014-11-16 DIAGNOSIS — L84 Corns and callosities: Secondary | ICD-10-CM | POA: Diagnosis not present

## 2014-11-26 ENCOUNTER — Other Ambulatory Visit: Payer: Self-pay | Admitting: Family Medicine

## 2014-11-26 NOTE — Telephone Encounter (Deleted)
error 

## 2014-11-26 NOTE — Telephone Encounter (Signed)
Received refill request for Fluticasone 62mcg/inh aer

## 2014-12-08 DIAGNOSIS — L821 Other seborrheic keratosis: Secondary | ICD-10-CM | POA: Diagnosis not present

## 2014-12-08 DIAGNOSIS — D1801 Hemangioma of skin and subcutaneous tissue: Secondary | ICD-10-CM | POA: Diagnosis not present

## 2014-12-08 DIAGNOSIS — Z85828 Personal history of other malignant neoplasm of skin: Secondary | ICD-10-CM | POA: Diagnosis not present

## 2014-12-08 DIAGNOSIS — L820 Inflamed seborrheic keratosis: Secondary | ICD-10-CM | POA: Diagnosis not present

## 2014-12-13 ENCOUNTER — Other Ambulatory Visit (INDEPENDENT_AMBULATORY_CARE_PROVIDER_SITE_OTHER): Payer: Medicare Other

## 2014-12-13 DIAGNOSIS — Z23 Encounter for immunization: Secondary | ICD-10-CM

## 2014-12-21 ENCOUNTER — Other Ambulatory Visit: Payer: Self-pay

## 2014-12-21 DIAGNOSIS — Z1231 Encounter for screening mammogram for malignant neoplasm of breast: Secondary | ICD-10-CM

## 2015-01-25 ENCOUNTER — Ambulatory Visit
Admission: RE | Admit: 2015-01-25 | Discharge: 2015-01-25 | Disposition: A | Discharge status: Home / Self Care | Payer: Medicare Other | Source: Ambulatory Visit

## 2015-01-25 DIAGNOSIS — Z1231 Encounter for screening mammogram for malignant neoplasm of breast: Secondary | ICD-10-CM | POA: Diagnosis not present

## 2015-01-31 ENCOUNTER — Other Ambulatory Visit: Payer: Self-pay | Admitting: Family Medicine

## 2015-02-21 DIAGNOSIS — H1852 Epithelial (juvenile) corneal dystrophy: Secondary | ICD-10-CM | POA: Diagnosis not present

## 2015-02-21 DIAGNOSIS — H1859 Other hereditary corneal dystrophies: Secondary | ICD-10-CM | POA: Diagnosis not present

## 2015-02-22 DIAGNOSIS — L84 Corns and callosities: Secondary | ICD-10-CM | POA: Diagnosis not present

## 2015-02-28 ENCOUNTER — Encounter: Payer: Self-pay | Admitting: *Deleted

## 2015-03-09 ENCOUNTER — Ambulatory Visit (INDEPENDENT_AMBULATORY_CARE_PROVIDER_SITE_OTHER): Payer: Medicare Other | Admitting: Family Medicine

## 2015-03-09 ENCOUNTER — Encounter: Payer: Self-pay | Admitting: Family Medicine

## 2015-03-09 VITALS — BP 138/76 | HR 60 | Ht 65.0 in | Wt 143.6 lb

## 2015-03-09 DIAGNOSIS — Z5181 Encounter for therapeutic drug level monitoring: Secondary | ICD-10-CM | POA: Diagnosis not present

## 2015-03-09 DIAGNOSIS — J309 Allergic rhinitis, unspecified: Secondary | ICD-10-CM

## 2015-03-09 DIAGNOSIS — I10 Essential (primary) hypertension: Secondary | ICD-10-CM

## 2015-03-09 DIAGNOSIS — E782 Mixed hyperlipidemia: Secondary | ICD-10-CM

## 2015-03-09 DIAGNOSIS — Z8781 Personal history of (healed) traumatic fracture: Secondary | ICD-10-CM

## 2015-03-09 DIAGNOSIS — E039 Hypothyroidism, unspecified: Secondary | ICD-10-CM

## 2015-03-09 DIAGNOSIS — Z0000 Encounter for general adult medical examination without abnormal findings: Secondary | ICD-10-CM

## 2015-03-09 DIAGNOSIS — R7301 Impaired fasting glucose: Secondary | ICD-10-CM

## 2015-03-09 DIAGNOSIS — Z780 Asymptomatic menopausal state: Secondary | ICD-10-CM

## 2015-03-09 LAB — CBC WITH DIFFERENTIAL/PLATELET
BASOS ABS: 0 10*3/uL (ref 0.0–0.1)
BASOS PCT: 0 % (ref 0–1)
Eosinophils Absolute: 0.1 10*3/uL (ref 0.0–0.7)
Eosinophils Relative: 1 % (ref 0–5)
HCT: 45.1 % (ref 36.0–46.0)
Hemoglobin: 15.3 g/dL — ABNORMAL HIGH (ref 12.0–15.0)
LYMPHS ABS: 1.4 10*3/uL (ref 0.7–4.0)
Lymphocytes Relative: 18 % (ref 12–46)
MCH: 29.7 pg (ref 26.0–34.0)
MCHC: 33.9 g/dL (ref 30.0–36.0)
MCV: 87.4 fL (ref 78.0–100.0)
MONOS PCT: 9 % (ref 3–12)
MPV: 11.9 fL (ref 8.6–12.4)
Monocytes Absolute: 0.7 10*3/uL (ref 0.1–1.0)
NEUTROS ABS: 5.7 10*3/uL (ref 1.7–7.7)
NEUTROS PCT: 72 % (ref 43–77)
PLATELETS: 231 10*3/uL (ref 150–400)
RBC: 5.16 MIL/uL — ABNORMAL HIGH (ref 3.87–5.11)
RDW: 13 % (ref 11.5–15.5)
WBC: 7.9 10*3/uL (ref 4.0–10.5)

## 2015-03-09 LAB — COMPREHENSIVE METABOLIC PANEL
ALBUMIN: 4.4 g/dL (ref 3.6–5.1)
ALK PHOS: 78 U/L (ref 33–130)
ALT: 18 U/L (ref 6–29)
AST: 26 U/L (ref 10–35)
BUN: 22 mg/dL (ref 7–25)
CO2: 30 mmol/L (ref 20–31)
Calcium: 10 mg/dL (ref 8.6–10.4)
Chloride: 100 mmol/L (ref 98–110)
Creat: 0.79 mg/dL (ref 0.60–0.93)
GLUCOSE: 90 mg/dL (ref 65–99)
POTASSIUM: 3.8 mmol/L (ref 3.5–5.3)
Sodium: 140 mmol/L (ref 135–146)
Total Bilirubin: 1.9 mg/dL — ABNORMAL HIGH (ref 0.2–1.2)
Total Protein: 6.3 g/dL (ref 6.1–8.1)

## 2015-03-09 LAB — LIPID PANEL
CHOLESTEROL: 159 mg/dL (ref 125–200)
HDL: 60 mg/dL (ref >=46)
LDL Cholesterol: 76 mg/dL (ref <130)
TRIGLYCERIDES: 115 mg/dL (ref <150)
Total CHOL/HDL Ratio: 2.7 Ratio (ref <=5.0)
VLDL: 23 mg/dL (ref <30)

## 2015-03-09 MED ORDER — SIMVASTATIN 20 MG PO TABS: 20.0000 mg | ORAL_TABLET | Freq: Every day | ORAL | Status: DC

## 2015-03-09 NOTE — Patient Instructions (Addendum)
  HEALTH MAINTENANCE RECOMMENDATIONS:  It is recommended that you get at least 30 minutes of aerobic exercise at least 5 days/week (for weight loss, you may need as much as 60-90 minutes). This can be any activity that gets your heart rate up. This can be divided in 10-15 minute intervals if needed, but try and build up your endurance at least once a week.  Weight bearing exercise is also recommended twice weekly.  Eat a healthy diet with lots of vegetables, fruits and fiber.  "Colorful" foods have a lot of vitamins (ie green vegetables, tomatoes, red peppers, etc).  Limit sweet tea, regular sodas and alcoholic beverages, all of which has a lot of calories and sugar.  Up to 1 alcoholic drink daily may be beneficial for women (unless trying to lose weight, watch sugars).  Drink a lot of water.  Calcium recommendations are 1200-1500 mg daily (1500 mg for postmenopausal women or women without ovaries), and vitamin D 1000 IU daily.  This should be obtained from diet and/or supplements (vitamins), and calcium should not be taken all at once, but in divided doses.  Monthly self breast exams and yearly mammograms for women over the age of 14 is recommended.  Sunscreen of at least SPF 30 should be used on all sun-exposed parts of the skin when outside between the hours of 10 am and 4 pm (not just when at beach or pool, but even with exercise, golf, tennis, and yard work!)  Use a sunscreen that says "broad spectrum" so it covers both UVA and UVB rays, and make sure to reapply every 1-2 hours.  Remember to change the batteries in your smoke detectors when changing your clock times in the spring and fall.  Use your seat belt every time you are in a car, and please drive safely and not be distracted with cell phones and texting while driving.    Ms. Skogman , Thank you for taking time to come for your Medicare Wellness Visit. I appreciate your ongoing commitment to your health goals. Please review the  following plan we discussed and let me know if I can assist you in the future.   These are the goals we discussed: Goals    None      This is a list of the screening recommended for you and due dates:  Health Maintenance  Topic Date Due  . Flu Shot  10/25/2015  . Tetanus Vaccine  01/30/2021  . DEXA scan (bone density measurement)  Completed  . Shingles Vaccine  Completed  . Pneumonia vaccines  Completed   We are going to do another bone density test given your fracture last year.  If you don't hear from the [facility] to schedule it, please call them directly. Your next mammogram is due 01/2016. Colonoscopy is due again in 2019.  We may consider Cologard vs colonoscopy at that time, you can discuss with Dr. Watt Climes.  Periodically please check your blood pressure at home.  Normal is <130/80, if it is <135/85, it is borderline.  If persistently over 140/90 then return with your list of blood pressures so we can change your medication.
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
## 2015-03-10 LAB — HEMOGLOBIN A1C
Hgb A1c MFr Bld: 5.7 % — ABNORMAL HIGH (ref <5.7)
MEAN PLASMA GLUCOSE: 117 mg/dL — AB (ref <117)

## 2015-03-11 ENCOUNTER — Encounter: Payer: Self-pay | Admitting: Family Medicine

## 2015-03-30 IMAGING — CT CT HEAD W/O CM
2 series · 16 of 30 positions shown, 20 images · non-contrast
Comparison: CT head 02/26/2014 and 02/27/2014

CLINICAL DATA: Followup contusion. Patient fell in [REDACTED] and hit
head.

EXAM:
CT HEAD WITHOUT CONTRAST
TECHNIQUE: Contiguous axial images were obtained from the base of the skull
through the vertex without intravenous contrast.

[Series 3: head bone · axial · 0.49mm/px · z∈[+4,+45]mm · 3 of 28 slices shown]
[im 2/28  bone]
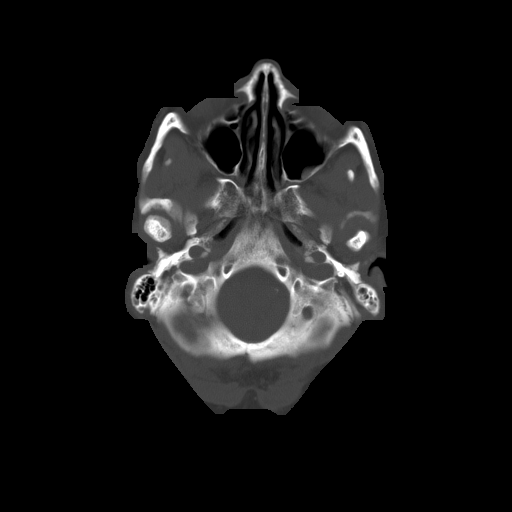
[im 6/28  bone]
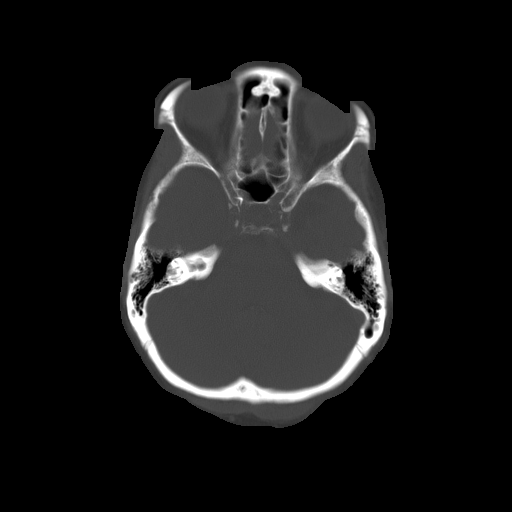
[im 10/28  bone]
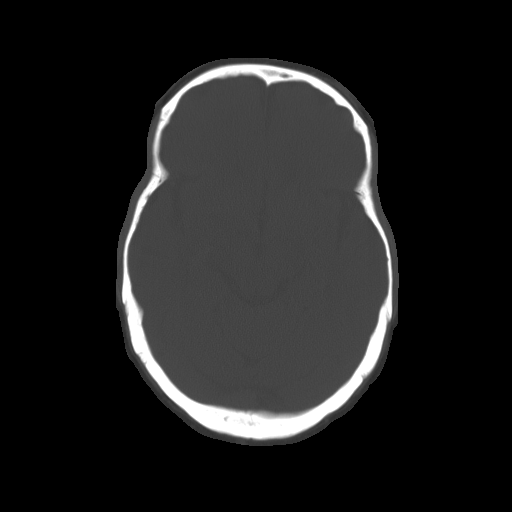

[Series 32: 3d filtered head w/o · axial · non-contrast · 0.49mm/px · z∈[+4,+127]mm · 13 of 28 slices shown, 17 images]
[im 2/28  brain]
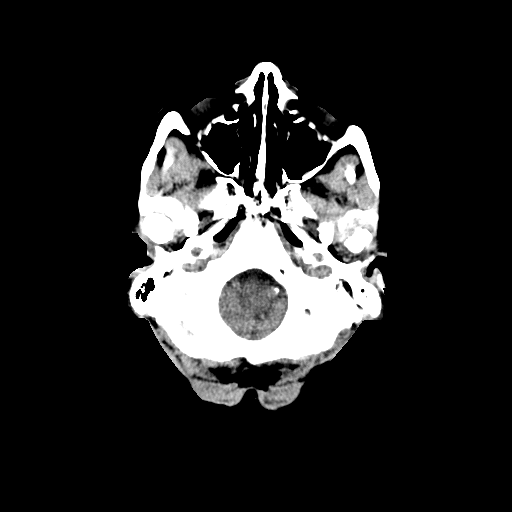
[im 2/28  bone]
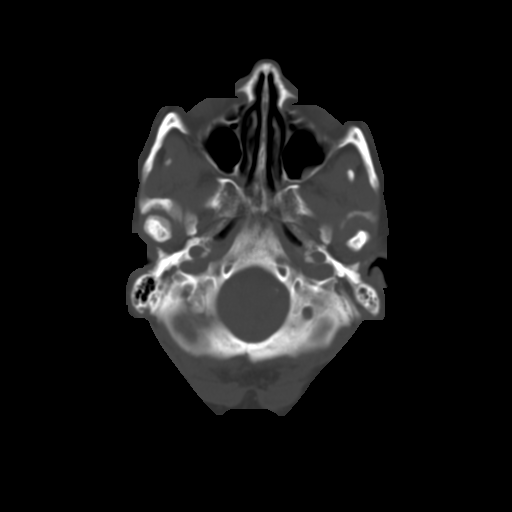
[im 4/28  brain]
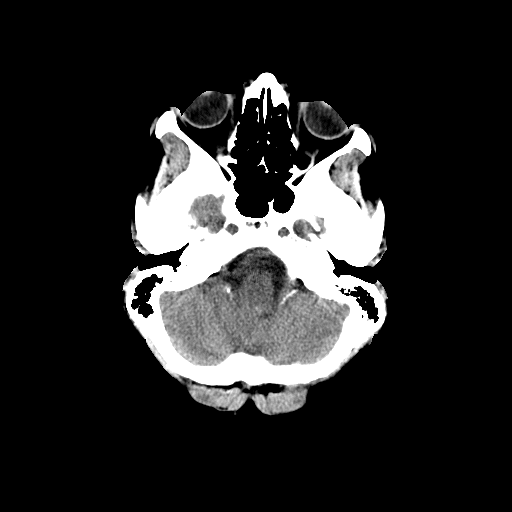
[im 6/28  brain]
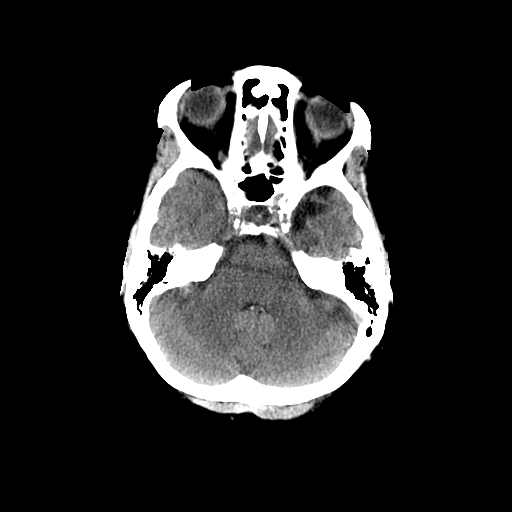
[im 8/28  brain]
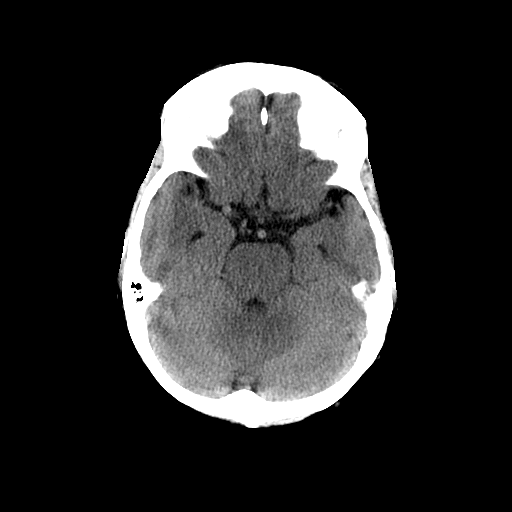
[im 10/28  brain]
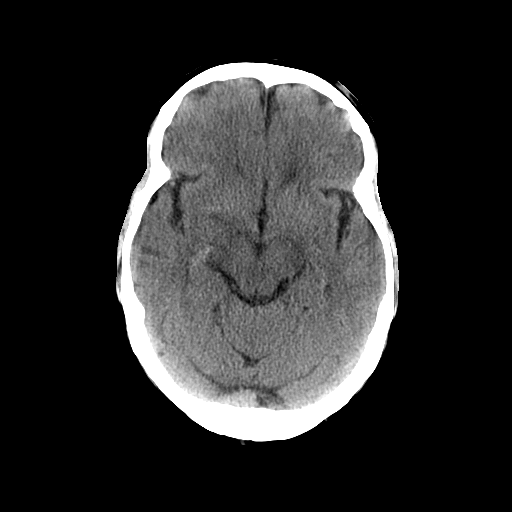
[im 10/28  bone]
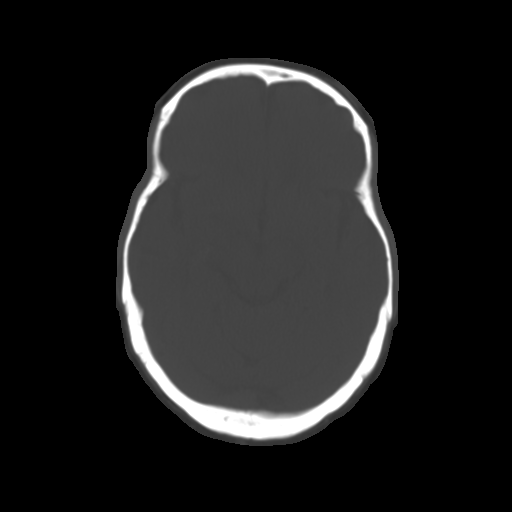
[im 12/28  brain]
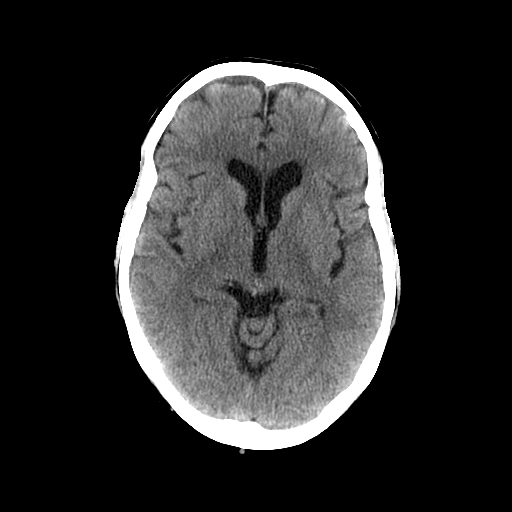
[im 14/28  brain]
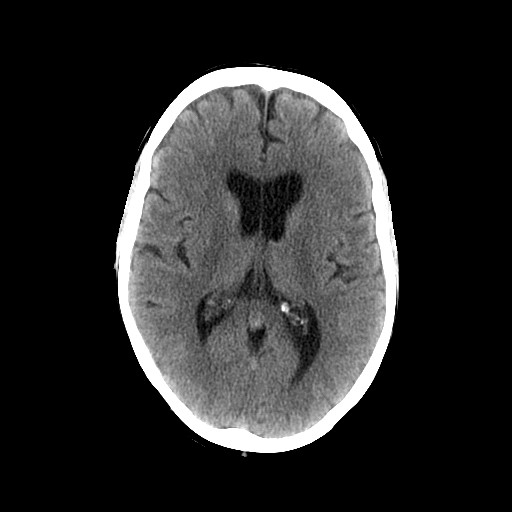
[im 16/28  brain]
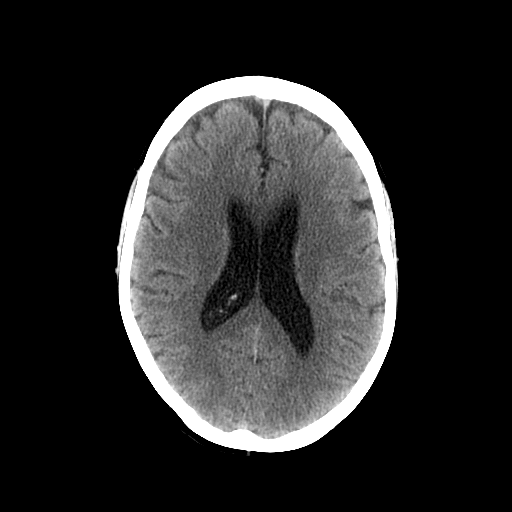
[im 18/28  brain]
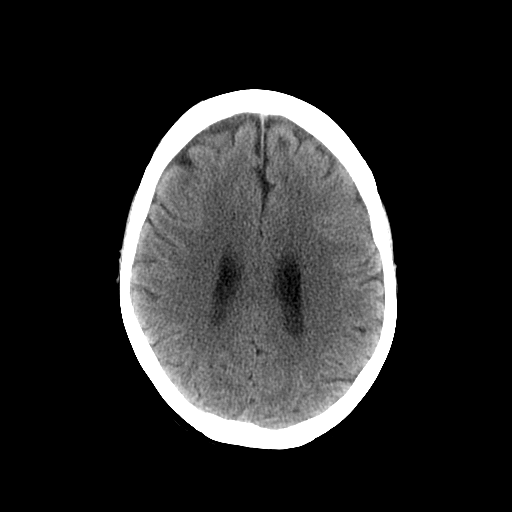
[im 18/28  bone]
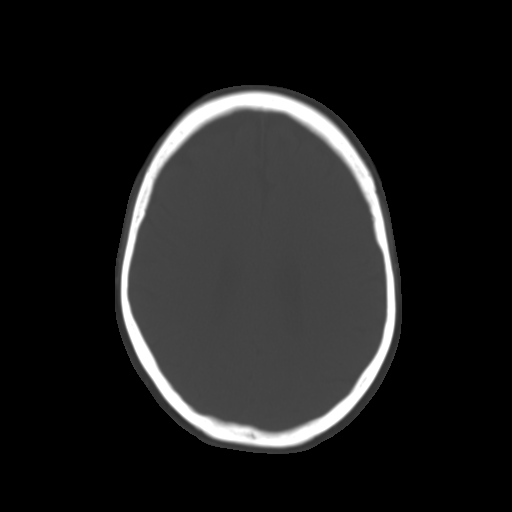
[im 20/28  brain]
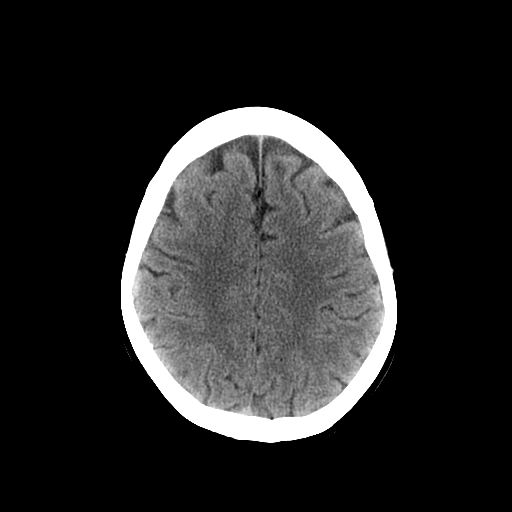
[im 22/28  brain]
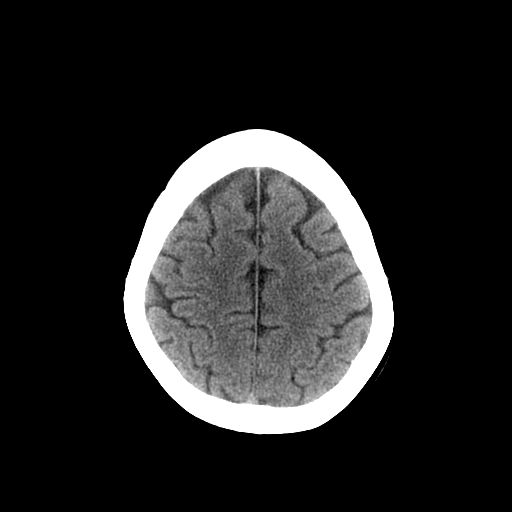
[im 24/28  brain]
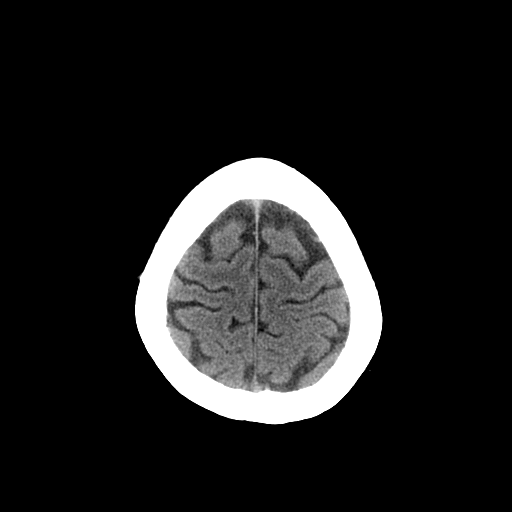
[im 26/28  brain]
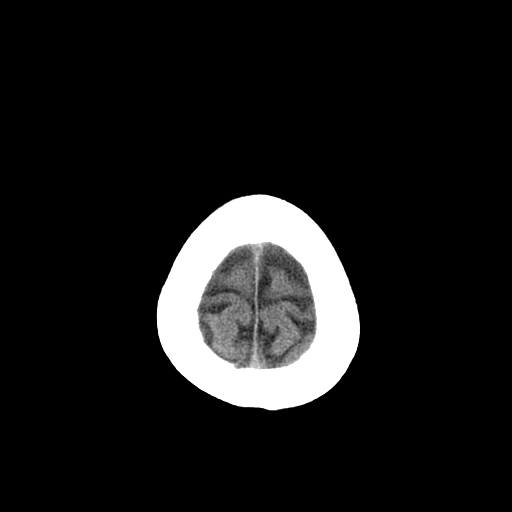
[im 26/28  bone]
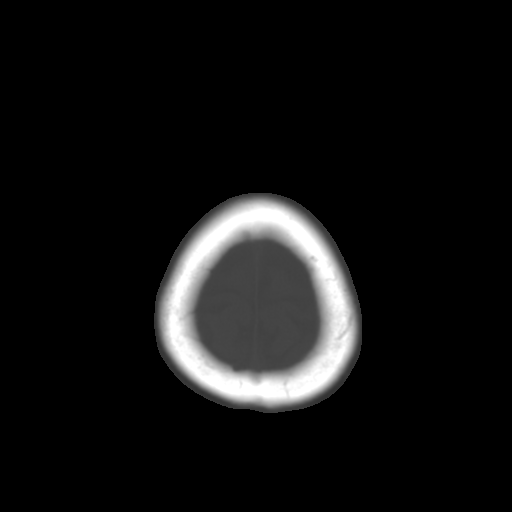

[16 of 30 positions shown; findings below may reference images not displayed]

FINDINGS: Resolved subarachnoid blood and parenchymal contusions. Slight
hypoattenuation of a LEFT frontal pole gyrus and subcortical white
matter reflects the largest area of previous parenchymal contusion,
as observed on image 18. No intervening hydrocephalus or extra-axial
fluid collection. No occult skull fracture is evident. No sinus
air-fluid level is noted. Mild atrophy is normal for age and stable.
IMPRESSION: Resolved subarachnoid blood and parenchymal contusions. No interval
development of hydrocephalus or extra-axial fluid collection.

## 2015-04-25 ENCOUNTER — Other Ambulatory Visit: Payer: Self-pay | Admitting: Family Medicine

## 2015-05-04 ENCOUNTER — Other Ambulatory Visit: Payer: Self-pay | Admitting: Family Medicine

## 2015-05-25 DIAGNOSIS — L84 Corns and callosities: Secondary | ICD-10-CM | POA: Diagnosis not present

## 2015-05-26 DIAGNOSIS — D3132 Benign neoplasm of left choroid: Secondary | ICD-10-CM | POA: Diagnosis not present

## 2015-05-26 DIAGNOSIS — H1852 Epithelial (juvenile) corneal dystrophy: Secondary | ICD-10-CM | POA: Diagnosis not present

## 2015-05-26 DIAGNOSIS — H353132 Nonexudative age-related macular degeneration, bilateral, intermediate dry stage: Secondary | ICD-10-CM | POA: Diagnosis not present

## 2015-05-26 DIAGNOSIS — H2513 Age-related nuclear cataract, bilateral: Secondary | ICD-10-CM | POA: Diagnosis not present

## 2015-05-26 DIAGNOSIS — H353190 Nonexudative age-related macular degeneration, unspecified eye, stage unspecified: Secondary | ICD-10-CM | POA: Diagnosis not present

## 2015-08-01 ENCOUNTER — Telehealth: Payer: Self-pay | Admitting: Family Medicine

## 2015-08-01 MED ORDER — FLUTICASONE PROPIONATE 50 MCG/ACT NA SUSP: NASAL | Status: DC

## 2015-08-01 NOTE — Telephone Encounter (Signed)
Rcvd refill request for Fluticasone 50 mcg

## 2015-08-01 NOTE — Telephone Encounter (Signed)
Done

## 2015-08-02 ENCOUNTER — Telehealth: Payer: Self-pay | Admitting: Family Medicine

## 2015-08-02 NOTE — Telephone Encounter (Signed)
Rcvd refill request for Atenolol 25 mg #90, Losartan 100-25 mg #90

## 2015-08-03 MED ORDER — LOSARTAN POTASSIUM-HCTZ 100-25 MG PO TABS: 1.0000 | ORAL_TABLET | Freq: Every day | ORAL | Status: DC

## 2015-08-03 MED ORDER — ATENOLOL 25 MG PO TABS: ORAL_TABLET | ORAL | Status: DC

## 2015-08-03 NOTE — Telephone Encounter (Signed)
Done

## 2015-08-25 DIAGNOSIS — L84 Corns and callosities: Secondary | ICD-10-CM | POA: Diagnosis not present

## 2015-09-12 ENCOUNTER — Other Ambulatory Visit: Payer: Self-pay | Admitting: Family Medicine

## 2015-09-12 NOTE — Telephone Encounter (Signed)
Pt has appt 09/15/2015, medication renewed for 90 days per pt request to last until appt. Courtney Castro December

## 2015-09-15 ENCOUNTER — Ambulatory Visit (INDEPENDENT_AMBULATORY_CARE_PROVIDER_SITE_OTHER): Payer: Medicare Other | Admitting: Family Medicine

## 2015-09-15 ENCOUNTER — Encounter: Payer: Self-pay | Admitting: Family Medicine

## 2015-09-15 VITALS — BP 112/60 | HR 60 | Wt 143.4 lb

## 2015-09-15 DIAGNOSIS — R7301 Impaired fasting glucose: Secondary | ICD-10-CM

## 2015-09-15 DIAGNOSIS — E039 Hypothyroidism, unspecified: Secondary | ICD-10-CM

## 2015-09-15 DIAGNOSIS — Z5181 Encounter for therapeutic drug level monitoring: Secondary | ICD-10-CM | POA: Diagnosis not present

## 2015-09-15 DIAGNOSIS — E782 Mixed hyperlipidemia: Secondary | ICD-10-CM

## 2015-09-15 DIAGNOSIS — I10 Essential (primary) hypertension: Secondary | ICD-10-CM | POA: Diagnosis not present

## 2015-09-15 LAB — TSH: TSH: 1.62 m[IU]/L

## 2015-09-15 LAB — POCT GLYCOSYLATED HEMOGLOBIN (HGB A1C): Hemoglobin A1C: 5.8

## 2015-09-15 LAB — GLUCOSE, RANDOM: Glucose, Bld: 92 mg/dL (ref 65–99)

## 2015-09-15 LAB — HEPATIC FUNCTION PANEL
ALK PHOS: 73 U/L (ref 33–130)
ALT: 17 U/L (ref 6–29)
AST: 25 U/L (ref 10–35)
Albumin: 4.5 g/dL (ref 3.6–5.1)
BILIRUBIN DIRECT: 0.3 mg/dL — AB (ref <=0.2)
BILIRUBIN TOTAL: 1.6 mg/dL — AB (ref 0.2–1.2)
Indirect Bilirubin: 1.3 mg/dL — ABNORMAL HIGH (ref 0.2–1.2)
Total Protein: 6.2 g/dL (ref 6.1–8.1)

## 2015-09-15 NOTE — Patient Instructions (Addendum)
I encourage you to get regular exercise. At least 30 minutes daily of aerobic exercise (in 10-15 minute intervals is fine).  Weight-bearing exercise at least 2x/week. Consider looking into a recumbent bicycle to help you get your cardio indoors, rather than trying to walk outside, which is dependent on the weather.  We will call you tomorrow with your thyroid results, and let you know if it is okay to fill the prescription that was renewed earlier in the week, or if you need a new dose.

## 2015-09-15 NOTE — Progress Notes (Signed)
Chief Complaint  Patient presents with  . Medication Management    DM. fasting. does not check blood sugar, is aware of her diet and monitors sugar content that way.    Patient presents for a med check.  She mentions that she had an ache behind her left ear the other day. Lasted for a few seconds and hasn't recurred. She has chronic allergies, required Mucinex in February.  She has some chronic mucus in the morning, usually clear  HTN: Blood pressure isn't checked regularly, although she has a monitor at home. Denies dizziness or headaches, chest pain, muscle cramps, edema. Compliant with medications.   Hyperlipidemia follow-up: Patient is reportedly following a low-fat, low cholesterol diet. Compliant with medications and denies medication side effects   Hypothyroidism: She denies any symptoms related to thyroid; compliant with taking her medication, taking it on an empty stomach (30 minutes prior to eating). She separates the thyroid medication from her calcium and vitamins by at least 2 hours. Denies hair/skin/bowel/energy changes (just slowing down somewhat since turning 82); overall energy is okay, some days and weeks are better than others.  Impaired fasting glucose:She cut back on eating ice cream (now just twice a month) and has been monitoring her sugar intake and sweets.  She is up and down the stairs in her home, but not getting regular exercise.  She likes to walk, but it has been too humid.  She doesn't want to go to the [facility] where there are a lot of people. She is looking to find someone to get her on a program.    PMH, PSH, SH reviewed and updated  Outpatient Encounter Prescriptions as of 09/15/2015  Medication Sig  . aspirin 81 MG tablet Take 81 mg by mouth daily.  Marland Kitchen atenolol (TENORMIN) 25 MG tablet TAKE 1 TABLET (25 MG TOTAL) BY MOUTH DAILY.  . Calcium Carbonate-Vitamin D (CALTRATE 600+D) 600-400 MG-UNIT per tablet Take 1 tablet by mouth 2 (two) times daily.    .  Cholecalciferol (VITAMIN D) 2000 UNITS tablet Take 2,000 Units by mouth daily.    . fexofenadine (ALLEGRA) 180 MG tablet Take 180 mg by mouth daily.    . fluticasone (FLONASE) 50 MCG/ACT nasal spray INHALE 2 SPRAYS INTO THE NOSE DAILY.  Marland Kitchen GLUCOSAMINE PO Take 2,000 mg by mouth 2 (two) times daily.    Marland Kitchen levothyroxine (SYNTHROID, LEVOTHROID) 25 MCG tablet TAKE 1 TABLET (25 MCG TOTAL) BY MOUTH DAILY.  Marland Kitchen losartan-hydrochlorothiazide (HYZAAR) 100-25 MG tablet Take 1 tablet by mouth daily.  . Multiple Vitamins-Minerals (CENTRUM SILVER PO) Take 1 tablet by mouth daily.    . Multiple Vitamins-Minerals (PRESERVISION/LUTEIN) CAPS Take 1 capsule by mouth 2 (two) times daily.    . simvastatin (ZOCOR) 20 MG tablet TAKE 1 TABLET (20 MG TOTAL) BY MOUTH AT BEDTIME.  . [DISCONTINUED] levothyroxine (SYNTHROID, LEVOTHROID) 25 MCG tablet TAKE 1 TABLET (25 MCG TOTAL) BY MOUTH DAILY.  . [DISCONTINUED] simvastatin (ZOCOR) 20 MG tablet Take 1 tablet (20 mg total) by mouth at bedtime.   No facility-administered encounter medications on file as of 09/15/2015.   Allergies  Allergen Reactions  . Percocet [Oxycodone-Acetaminophen] Other (See Comments)    Upset stomach    ROS: no fever, chills, headaches, dizziness, URI symptoms, cough, shortness of breath. Mild allergy symptoms as per HPI.  No chest pain, palpitations, nausea, vomiting, heartburn, bowel changes, skin/hair changes. No depression, anxiety, insomnia. No bleeding, bruising, rash. See HPI.  PHYSICAL EXAM: BP 112/60 mmHg  Pulse 60  Wt  143 lb 6.4 oz (65.046 kg)  Well developed, talkative female in no distress HEENT: PERRL, EOMI, conjunctiva clear.OP clear. Neck: no lymphadenopathy, thyromegaly or carotid bruit. No spinal tenderness. FROM, muscles are nontender without spasm Heart: regular rate and rhythm Lungs: clear bilaterally Abdomen: soft, nontender, no organomegaly or mass Back: no CVA or spinal tenderness Extremities: No edema, 2+ pulses Skin:  AK with scab next to it on her right forearm Psych: normal mood, affect, hygiene and grooming Neuro: alert and oriented. Normal strength, sensation, cranial nerves, gait, DTRs.    Lab Results  Component Value Date   HGBA1C 5.8 09/15/2015    ASSESSMENT/PLAN:  Impaired fasting glucose - stable; continue low sugar diet; encouraged regular exercise - Plan: POCT glycosylated hemoglobin (Hb A1C), Glucose, random  Essential hypertension, benign - well controlled  Hypothyroidism, unspecified hypothyroidism type - euthyroid by history - Plan: TSH  Mixed hyperlipidemia  Medication monitoring encounter - Plan: Hepatic function panel  Suspected AK right arm-- Has derm visit scheduled in September.  Checking with Joneen Boers re: personal training in her home (given patient's number, will contact her directly).  Discussed exercise recommendations.  Encouraged regular aerobic exercise--consider recumbent bicycle to safely do this in her home, regardless of the weather (since she doesn't want to go to gym).  Thyroid med refilled already this week x 90d, hasn't picked up yet. Told not to pick up rx until we contact her with results   F/u 6 months for CPE/AWV, sooner prn.

## 2015-10-25 ENCOUNTER — Other Ambulatory Visit: Payer: Self-pay | Admitting: Family Medicine

## 2015-10-26 ENCOUNTER — Other Ambulatory Visit: Payer: Self-pay | Admitting: Family Medicine

## 2015-10-31 ENCOUNTER — Other Ambulatory Visit: Payer: Self-pay | Admitting: Family Medicine

## 2015-11-29 DIAGNOSIS — L84 Corns and callosities: Secondary | ICD-10-CM | POA: Diagnosis not present

## 2015-12-07 ENCOUNTER — Other Ambulatory Visit: Payer: Self-pay | Admitting: Family Medicine

## 2015-12-15 DIAGNOSIS — L570 Actinic keratosis: Secondary | ICD-10-CM | POA: Diagnosis not present

## 2015-12-15 DIAGNOSIS — C44319 Basal cell carcinoma of skin of other parts of face: Secondary | ICD-10-CM | POA: Diagnosis not present

## 2015-12-15 DIAGNOSIS — Z85828 Personal history of other malignant neoplasm of skin: Secondary | ICD-10-CM | POA: Diagnosis not present

## 2015-12-15 DIAGNOSIS — L821 Other seborrheic keratosis: Secondary | ICD-10-CM | POA: Diagnosis not present

## 2015-12-15 DIAGNOSIS — L720 Epidermal cyst: Secondary | ICD-10-CM | POA: Diagnosis not present

## 2015-12-15 DIAGNOSIS — D692 Other nonthrombocytopenic purpura: Secondary | ICD-10-CM | POA: Diagnosis not present

## 2015-12-22 ENCOUNTER — Other Ambulatory Visit (INDEPENDENT_AMBULATORY_CARE_PROVIDER_SITE_OTHER): Payer: Medicare Other

## 2015-12-22 DIAGNOSIS — Z23 Encounter for immunization: Secondary | ICD-10-CM | POA: Diagnosis not present

## 2015-12-23 ENCOUNTER — Other Ambulatory Visit: Payer: Medicare Other

## 2016-01-04 ENCOUNTER — Other Ambulatory Visit: Payer: Self-pay | Admitting: Family Medicine

## 2016-01-04 DIAGNOSIS — C44319 Basal cell carcinoma of skin of other parts of face: Secondary | ICD-10-CM | POA: Diagnosis not present

## 2016-01-04 DIAGNOSIS — Z1231 Encounter for screening mammogram for malignant neoplasm of breast: Secondary | ICD-10-CM

## 2016-01-18 ENCOUNTER — Encounter: Payer: Self-pay | Admitting: Family Medicine

## 2016-02-01 ENCOUNTER — Ambulatory Visit
Admission: RE | Admit: 2016-02-01 | Discharge: 2016-02-01 | Disposition: A | Discharge status: Home / Self Care | Payer: Medicare Other | Source: Ambulatory Visit | Attending: Family Medicine | Admitting: Family Medicine

## 2016-02-01 DIAGNOSIS — Z1231 Encounter for screening mammogram for malignant neoplasm of breast: Secondary | ICD-10-CM

## 2016-03-05 ENCOUNTER — Other Ambulatory Visit: Payer: Self-pay | Admitting: Family Medicine

## 2016-03-06 DIAGNOSIS — L84 Corns and callosities: Secondary | ICD-10-CM | POA: Diagnosis not present

## 2016-03-14 DIAGNOSIS — H1852 Epithelial (juvenile) corneal dystrophy: Secondary | ICD-10-CM | POA: Diagnosis not present

## 2016-03-14 DIAGNOSIS — H353190 Nonexudative age-related macular degeneration, unspecified eye, stage unspecified: Secondary | ICD-10-CM | POA: Diagnosis not present

## 2016-03-14 DIAGNOSIS — H2513 Age-related nuclear cataract, bilateral: Secondary | ICD-10-CM | POA: Diagnosis not present

## 2016-04-22 NOTE — Progress Notes (Signed)
Chief Complaint  Patient presents with  . Medicare Wellness    fasting AWV/CPE with pelvic. Sees [facility]. UA showed 3+ leuks and 1+ nit and does mention urgency and increased night time frequency.  No other concerns.    Courtney Castro is a 83 y.o. female who presents for annual physical, Medicare wellness visit and follow-up on chronic medical conditions.  She has the following concerns:  HTN: Blood pressure isn't checked regularly, although she has a monitor at home. Denies dizziness or headaches, chest pain, muscle cramps, edema. Compliant with medications.   Hyperlipidemia follow-up: Patient is reportedly following a low-fat, low cholesterol diet. Compliant with medications and denies medication side effects   Hypothyroidism: She denies any symptoms related to thyroid; compliant with taking her medication, taking it on an empty stomach (30 minutes prior to eating). She separates the thyroid medication from her calcium and vitamins by at least 2 hours. Denies hair/skin/bowel/energy changes; overall energy is okay.  Had some decrease in energy/mood for a short bit prior to Christmas, back to normal now. Lab Results  Component Value Date   TSH 1.62 09/15/2015    Impaired fasting glucose:She cut back on eating ice cream (now just twice a month or less) and has been monitoring/limiting her sugar intake and sweets. She is working with Joneen Boers, and is now on a regular exercise regimen  Immunization History  Administered Date(s) Administered  . Influenza Split 12/11/2009, 01/02/2011, 12/21/2011  . Influenza, High Dose Seasonal PF 12/05/2012, 12/17/2013, 12/13/2014, 12/22/2015  . Pneumococcal Conjugate-13 03/04/2014  . Pneumococcal Polysaccharide-23 11/07/2001, 01/31/2011  . Td 12/18/2004  . Tdap 01/31/2011  . Zoster 08/25/2007   Last Pap smear: 2011, s/p hysterectomy for benign reasons  Last mammogram: 01/2016 Last colonoscopy:  June 2014 with Dr. Watt Climes. Normal per pt,  repeat in 5 years Last DEXA: 10/09 (normal; at [facility]); ordered again at last physical (02/2015), not done (h/o fracture) Dentist: twice yearly  Ophtho: twice yearly (once yearly for each doctor) Exercise: working with trainer Joneen Boers.  In July, she came 2x/week x 8 weeks, now coming every 2 weeks.  Home exercise regimen includes using resistance bands, 3# hand weights. Walking 20 minutes at least 6 days/week.   Other doctors caring for patient include: Ophtho: Dr. Susa Simmonds, Dr. Cordelia Pen (retinal specialist), at [facility] Dentist: Dr. Mariana Arn GI: Dr. Watt Climes Derm: Dr. Derrel Nip Podiatry: Dr. Fritzi Mandes Ortho: Dr. Wynelle Link, Dr. Rhona Raider Neurosurg: Dr. Weston Settle, Dr. Vertell Limber   Depression screen: negative Fall screen--no falls in the last year Functional Status-- unremarkable--see full screen in chart.  End of Life Discussion:  Patient has not yet filled out living will and healthcare power of attorney forms that were given at last visit.  She still has them at home and plans to do.  Past Medical History:  Diagnosis Date  . Allergy    seasonal allergies  . Basal cell carcinoma 2007, 2015, 2017   face and shoulder (Dr. Derrel Nip)   2017-chin  . Breast cancer ([place]) 1989   R breast-microinvasive papillary(lumpectomy and XRT)  . Colon polyp 09/2000  . Diverticulosis    seen on colonoscopy  . Elevated liver function tests 06/2009   galllstones and dilated CBD--s/p ERCP and chole  . Gilbert syndrome   . Hyperlipidemia   . Hypertension   . Hyperthyroidism   . IBS (irritable bowel syndrome) 1984  . Macular degeneration 2008   mild  . Radial head fracture 02/2014   left, nondisplaced  . Subarachnoid  hemorrhage ([place]) 02/2014   after fall  . Subdural hematoma ([place]) 06/2008   Dr.Cram  . Subdural hematoma ([place]) 02/2014   after fall (Dr. Vertell Limber)    Past Surgical History:  Procedure Laterality Date  . ABDOMINAL HYSTERECTOMY     bladder repair  . BREAST LUMPECTOMY  Right 1989   right breast  . ERCP  07/2009   sphincterotomy  . fracture left foot  01/2007   5th metatarsal-treated with boot  . HAMMER TOE SURGERY Right 05/2004   right foot  . LAPAROSCOPIC CHOLECYSTECTOMY  07/2009  . TOTAL KNEE ARTHROPLASTY Right 2000  . TOTAL KNEE ARTHROPLASTY Left 2004   Dr. Wynelle Link  . TUBAL LIGATION      Social History   Social History  . Marital status: Married    Spouse name: N/A  . Number of children: 2  . Years of education: N/A   Occupational History  .  Retired   Social History Main Topics  . Smoking status: Former Smoker    Packs/day: 0.10    Years: 3.00    Quit date: 03/27/1963  . Smokeless tobacco: Never Used  . Alcohol use 0.0 oz/week     Comment: 1 glass of wine per week or less  . Drug use: No  . Sexual activity: Yes    Partners: Male   Other Topics Concern  . Not on file   Social History Narrative   Retired Statistician.  1 daughter in [place] ([name]), and 1 daughter in [place]). 4 granddaughters    Family History  Problem Relation Age of Onset  . Leukemia Mother   . Ovarian cancer Mother     ??? hysterectomy age 19's  . Cancer Mother     leukemia; possible ovarian cancer (hysterectomy in her 55's)  . Coronary artery disease Father   . Hypertension Father   . Heart disease Father     onset late 68's  . Coronary artery disease Brother 60    coronary artery bypass grafting, pacemaker  . Heart disease Brother     CABG in 60's, pacemaker  . Breast cancer Maternal Aunt   . Cancer Maternal Aunt     breast  . Diabetes Paternal Uncle   . Diabetes Paternal Grandmother   . Cancer Cousin     breast  . Breast cancer Cousin   . Breast cancer Daughter 37  . Fibroids Daughter     Outpatient Encounter Prescriptions as of 04/23/2016  Medication Sig  . aspirin 81 MG tablet Take 81 mg by mouth daily.  Marland Kitchen atenolol (TENORMIN) 25 MG tablet TAKE ONE TABLET BY MOUTH DAILY  . Calcium Carbonate-Vitamin D (CALTRATE 600+D) 600-400  MG-UNIT per tablet Take 1 tablet by mouth 2 (two) times daily.    . Cholecalciferol (VITAMIN D) 2000 UNITS tablet Take 2,000 Units by mouth daily.    . fexofenadine (ALLEGRA) 180 MG tablet Take 180 mg by mouth daily.    . fluticasone (FLONASE) 50 MCG/ACT nasal spray PLACE TWO SPRAYS IN EACH NOSTRIL DAILY  . GLUCOSAMINE PO Take 2,000 mg by mouth 2 (two) times daily.    Marland Kitchen levothyroxine (SYNTHROID, LEVOTHROID) 25 MCG tablet TAKE ONE TABLET BY MOUTH DAILY  . losartan-hydrochlorothiazide (HYZAAR) 100-25 MG tablet TAKE ONE TABLET BY MOUTH DAILY  . Multiple Vitamins-Minerals (CENTRUM SILVER PO) Take 1 tablet by mouth daily.    . Multiple Vitamins-Minerals (PRESERVISION/LUTEIN) CAPS Take 1 capsule by mouth 2 (two) times daily.    . simvastatin (ZOCOR)  20 MG tablet TAKE 1 TABLET (20 MG TOTAL) BY MOUTH AT BEDTIME.   No facility-administered encounter medications on file as of 04/23/2016.     Allergies  Allergen Reactions  . Percocet [Oxycodone-Acetaminophen] Other (See Comments)    Upset stomach    ROS: The patient denies anorexia, fever, weight changes, headaches, vision changes, decreased hearing, ear pain, sore throat, breast concerns, chest pain, palpitations, dizziness, syncope, dyspnea on exertion, swelling, nausea, vomiting, diarrhea, constipation, abdominal pain, melena, hematochezia, indigestion/heartburn, hematuria, vaginal bleeding, genital lesions, joint pains, numbness, tingling, weakness, tremor, suspicious skin lesions, depression, anxiety, abnormal bleeding/bruising, or enlarged lymph nodes.  She denies any urinary complaints, just rare urge incontinence. Allergies are well controlled.  Slight dry cough at night when laying down sometimes (postnasal drainage). Recent slight runny nose. Neck pain resolved since sleeping with cervical pillow. Slight limitation in her range of motion in turning her head left and right--improving with exercises; denies pain. Occasional slight twinge in the  lower back/SI area.  Took Tylenol arthritis once for it two weeks ago. Had Moh's surgery on her chin in October 2017. Redness under the fuller, left breast, using athlete's foot powder.  Some redness persists.  No recent itching (resolved).   PHYSICAL EXAM:  BP 118/72 (BP Location: Left Arm, Patient Position: Sitting, Cuff Size: Normal)   Pulse 60   Ht 5' 4.75" (1.645 m)   Wt 139 lb 12.8 oz (63.4 kg)   BMI 23.44 kg/m    General Appearance:  Alert, cooperative, no distress, appears younger than stated age   Head:  Normocephalic, atraumatic  Eyes:  PERRL, conjunctiva/corneas clear, EOM's intact, fundi benign   Ears:  Normal TM's and external ear canals   Nose:  Nares normal, mucosa is mildly edematous, no purulence or erythema, sinuses nontender   Throat:  Lips, mucosa, and tongue normal; teeth and gums normal   Neck:  Supple, no lymphadenopathy; thyroid: no enlargement/tenderness/nodules; no carotid bruit or JVD   Back:  Spine nontender, no curvature, ROM normal, no CVA tenderness.   Lungs:  Clear to auscultation bilaterally without wheezes, rales or ronchi; respirations unlabored   Chest Wall:  No tenderness or deformity   Heart:  Regular rate and rhythm, S1 and S2 normal, no murmur, rub or gallop   Breast Exam:  No tenderness, masses, or nipple discharge or inversion. No axillary lymphadenopathy. WHSS R breast at 9 o'clock.There is also a WHSS in the right axilla.  Left breast larger than right. There is powder under the left breast; no erythemaor rash noted.  Many SK's and some skin tags.  Abdomen:  Soft, non-tender, nondistended, normoactive bowel sounds,  no masses, no hepatosplenomegaly   Genitalia:  Normal external genitalia without lesions, mild atrophic changes. BUS and vagina normal; Adnexa not palpable. Uterus surgically absent. No masses or tenderness. Pap not performed   Rectal:  Normal tone, no masses or tenderness;  guaiac negative stool   Extremities:  No clubbing, cyanosis or edema. WHSS on R foot.  Pulses:  2+ and symmetric all extremities   Skin:  Skin color, texture, turgor normal, no rashes or lesions. Many seborrheic keratosis throughout, especially on chest/abdomen  Lymph nodes:  Cervical, supraclavicular, and axillary nodes normal   Neurologic:  CNII-XII intact, normal strength, sensation and gait; reflexes 2+ and symmetric throughout     Psych:   Normal mood, affect, hygiene and grooming.   ASSESSMENT/PLAN:  Annual physical exam - Plan: POCT Urinalysis Dipstick  Medicare annual wellness visit, subsequent  Essential hypertension, benign - well controlled - Plan: Comprehensive metabolic panel, atenolol (TENORMIN) 25 MG tablet, losartan-hydrochlorothiazide (HYZAAR) 100-25 MG tablet  Mixed hyperlipidemia - Plan: Comprehensive metabolic panel, Lipid panel, simvastatin (ZOCOR) 20 MG tablet  Impaired fasting glucose - getting regular exercise, diet improved. recheck - Plan: Comprehensive metabolic panel, Hemoglobin A1c, CANCELED: HgB A1c  History of fracture of arm - Plan: DG Bone Density  Postmenopausal estrogen deficiency - Plan: DG Bone Density  Medication monitoring encounter - Plan: Comprehensive metabolic panel, Lipid panel  Hypothyroidism, unspecified type - euthyroid by history; monitor yearly (sooner prn symptoms)  Allergic rhinitis, unspecified chronicity, unspecified seasonality, unspecified trigger - Plan: fluticasone (FLONASE) 50 MCG/ACT nasal spray   c-met, lipid, A1c  Discussed monthly self breast exams and yearly mammograms; at least 30 minutes of aerobic activity at least 5 days/week, weight-bearing exercise 2/week; proper sunscreen use reviewed; healthy diet, including goals of calcium and vitamin D intake and alcohol recommendations (less than or equal to 1 drink/day) reviewed; regular seatbelt use; changing batteries in smoke  detectors. Immunization recommendations discussed--UTD, yearly flu shots. Shingrix recommended when available. Colonoscopy UTD, due again 2019 Repeat DEXA again recommended, advised to call and schedule.   Encouraged to fill out Living Will and Healthcare POA forms (given last year, and she reports still has at home), and to give [place] completed copies. She is Full Code, Full Care. MOST care discussed and filled out.   Medicare Attestation I have personally reviewed: The patient's medical and social history Their use of alcohol, tobacco or illicit drugs Their current medications and supplements The patient's functional ability including ADLs,fall risks, home safety risks, cognitive, and hearing and visual impairment Diet and physical activities Evidence for depression or mood disorders  The patient's weight, height, and BMI have been recorded in the chart.  I have made referrals, counseling, and provided education to the patient based on review of the above and I have provided the patient with a written personalized care plan for preventive services.     Nazareth Norenberg A, MD   04/22/2016

## 2016-04-23 ENCOUNTER — Ambulatory Visit (INDEPENDENT_AMBULATORY_CARE_PROVIDER_SITE_OTHER): Payer: Medicare Other | Admitting: Family Medicine

## 2016-04-23 ENCOUNTER — Encounter: Payer: Self-pay | Admitting: Family Medicine

## 2016-04-23 VITALS — BP 118/72 | HR 60 | Ht 64.75 in | Wt 139.8 lb

## 2016-04-23 DIAGNOSIS — J309 Allergic rhinitis, unspecified: Secondary | ICD-10-CM

## 2016-04-23 DIAGNOSIS — E782 Mixed hyperlipidemia: Secondary | ICD-10-CM

## 2016-04-23 DIAGNOSIS — Z8781 Personal history of (healed) traumatic fracture: Secondary | ICD-10-CM

## 2016-04-23 DIAGNOSIS — Z0000 Encounter for general adult medical examination without abnormal findings: Secondary | ICD-10-CM

## 2016-04-23 DIAGNOSIS — R7301 Impaired fasting glucose: Secondary | ICD-10-CM

## 2016-04-23 DIAGNOSIS — I10 Essential (primary) hypertension: Secondary | ICD-10-CM | POA: Diagnosis not present

## 2016-04-23 DIAGNOSIS — E039 Hypothyroidism, unspecified: Secondary | ICD-10-CM

## 2016-04-23 DIAGNOSIS — Z780 Asymptomatic menopausal state: Secondary | ICD-10-CM

## 2016-04-23 DIAGNOSIS — Z5181 Encounter for therapeutic drug level monitoring: Secondary | ICD-10-CM

## 2016-04-23 LAB — COMPREHENSIVE METABOLIC PANEL
ALT: 26 U/L (ref 6–29)
AST: 24 U/L (ref 10–35)
Albumin: 4.6 g/dL (ref 3.6–5.1)
Alkaline Phosphatase: 76 U/L (ref 33–130)
BUN: 21 mg/dL (ref 7–25)
CHLORIDE: 102 mmol/L (ref 98–110)
CO2: 28 mmol/L (ref 20–31)
Calcium: 10.2 mg/dL (ref 8.6–10.4)
Creat: 0.79 mg/dL (ref 0.60–0.88)
Glucose, Bld: 93 mg/dL (ref 65–99)
POTASSIUM: 3.9 mmol/L (ref 3.5–5.3)
SODIUM: 141 mmol/L (ref 135–146)
TOTAL PROTEIN: 6.8 g/dL (ref 6.1–8.1)
Total Bilirubin: 1.7 mg/dL — ABNORMAL HIGH (ref 0.2–1.2)

## 2016-04-23 LAB — POCT URINALYSIS DIPSTICK
Bilirubin, UA: NEGATIVE
Glucose, UA: NEGATIVE
KETONES UA: NEGATIVE
PROTEIN UA: NEGATIVE
RBC UA: NEGATIVE
SPEC GRAV UA: 1.02
UROBILINOGEN UA: NEGATIVE
pH, UA: 6

## 2016-04-23 LAB — LIPID PANEL
CHOLESTEROL: 176 mg/dL (ref <200)
HDL: 62 mg/dL (ref >50)
LDL CALC: 89 mg/dL (ref <100)
TRIGLYCERIDES: 127 mg/dL (ref <150)
Total CHOL/HDL Ratio: 2.8 Ratio (ref <5.0)
VLDL: 25 mg/dL (ref <30)

## 2016-04-23 LAB — HEMOGLOBIN A1C
Hgb A1c MFr Bld: 5.5 % (ref <5.7)
MEAN PLASMA GLUCOSE: 111 mg/dL

## 2016-04-23 MED ORDER — SIMVASTATIN 20 MG PO TABS: 20.0000 mg | ORAL_TABLET | Freq: Every day | ORAL | 1 refills | Status: DC

## 2016-04-23 MED ORDER — ATENOLOL 25 MG PO TABS: 25.0000 mg | ORAL_TABLET | Freq: Every day | ORAL | 1 refills | Status: DC

## 2016-04-23 MED ORDER — LOSARTAN POTASSIUM-HCTZ 100-25 MG PO TABS: 1.0000 | ORAL_TABLET | Freq: Every day | ORAL | 1 refills | Status: DC

## 2016-04-23 MED ORDER — FLUTICASONE PROPIONATE 50 MCG/ACT NA SUSP: NASAL | 5 refills | Status: DC

## 2016-04-23 NOTE — Patient Instructions (Addendum)
  HEALTH MAINTENANCE RECOMMENDATIONS:  It is recommended that you get at least 30 minutes of aerobic exercise at least 5 days/week (for weight loss, you may need as much as 60-90 minutes). This can be any activity that gets your heart rate up. This can be divided in 10-15 minute intervals if needed, but try and build up your endurance at least once a week.  Weight bearing exercise is also recommended twice weekly.  Eat a healthy diet with lots of vegetables, fruits and fiber.  "Colorful" foods have a lot of vitamins (ie green vegetables, tomatoes, red peppers, etc).  Limit sweet tea, regular sodas and alcoholic beverages, all of which has a lot of calories and sugar.  Up to 1 alcoholic drink daily may be beneficial for women (unless trying to lose weight, watch sugars).  Drink a lot of water.  Calcium recommendations are 1200-1500 mg daily (1500 mg for postmenopausal women or women without ovaries), and vitamin D 1000 IU daily.  This should be obtained from diet and/or supplements (vitamins), and calcium should not be taken all at once, but in divided doses.  Monthly self breast exams and yearly mammograms for women over the age of 54 is recommended.  Sunscreen of at least SPF 30 should be used on all sun-exposed parts of the skin when outside between the hours of 10 am and 4 pm (not just when at beach or pool, but even with exercise, golf, tennis, and yard work!)  Use a sunscreen that says "broad spectrum" so it covers both UVA and UVB rays, and make sure to reapply every 1-2 hours.  Remember to change the batteries in your smoke detectors when changing your clock times in the spring and fall.  Use your seat belt every time you are in a car, and please drive safely and not be distracted with cell phones and texting while driving.   Ms. Courtney Castro , Thank you for taking time to come for your Medicare Wellness Visit. I appreciate your ongoing commitment to your health goals. Please review the  following plan we discussed and let me know if I can assist you in the future.   These are the goals we discussed: Goals    None      This is a list of the screening recommended for you and due dates:  Health Maintenance  Topic Date Due  . Complete foot exam   05/21/1943  . Eye exam for diabetics  05/21/1943  . Hemoglobin A1C  03/16/2016  . Tetanus Vaccine  01/30/2021  . Flu Shot  Completed  . DEXA scan (bone density measurement)  Completed  . Shingles Vaccine  Completed  . Pneumonia vaccines  Completed   You aren't a diabetic--the foot exam and eye exam shouldn't be listed above. You are pre-diabetic and we are doing A1c today. Colonoscopy will be due again next year. Yearly mammograms should be continued, due again 01/2017  I recommend you get another bone density test.  [facility].  I recommend getting the new shingles vaccine (Shingrix) when available. You will need to check with your insurance to see if it is covered, and if covered by Medicare Part D, you need to get from the pharmacy rather than our office.  It is a series of 2 injections, spaced 2 months apart.  Please fill out the healthcare power of attorney and living will forms; once notarized, please be sure to get [place] copies of these.

## 2016-05-29 DIAGNOSIS — L84 Corns and callosities: Secondary | ICD-10-CM | POA: Diagnosis not present

## 2016-06-04 ENCOUNTER — Other Ambulatory Visit: Payer: Self-pay | Admitting: Family Medicine

## 2016-06-22 ENCOUNTER — Ambulatory Visit
Admission: RE | Admit: 2016-06-22 | Discharge: 2016-06-22 | Disposition: A | Discharge status: Home / Self Care | Payer: Medicare Other | Source: Ambulatory Visit | Attending: Family Medicine | Admitting: Family Medicine

## 2016-06-22 DIAGNOSIS — Z780 Asymptomatic menopausal state: Secondary | ICD-10-CM | POA: Diagnosis not present

## 2016-06-22 DIAGNOSIS — Z8781 Personal history of (healed) traumatic fracture: Secondary | ICD-10-CM

## 2016-06-22 DIAGNOSIS — Z13820 Encounter for screening for osteoporosis: Secondary | ICD-10-CM | POA: Diagnosis not present

## 2016-08-09 ENCOUNTER — Ambulatory Visit (INDEPENDENT_AMBULATORY_CARE_PROVIDER_SITE_OTHER): Payer: Medicare Other | Admitting: Family Medicine

## 2016-08-09 ENCOUNTER — Encounter: Payer: Self-pay | Admitting: Family Medicine

## 2016-08-09 VITALS — BP 112/78 | HR 68 | Ht 64.75 in | Wt 139.2 lb

## 2016-08-09 DIAGNOSIS — L84 Corns and callosities: Secondary | ICD-10-CM | POA: Diagnosis not present

## 2016-08-09 DIAGNOSIS — K644 Residual hemorrhoidal skin tags: Secondary | ICD-10-CM

## 2016-08-09 DIAGNOSIS — M79672 Pain in left foot: Secondary | ICD-10-CM | POA: Diagnosis not present

## 2016-08-09 DIAGNOSIS — M79671 Pain in right foot: Secondary | ICD-10-CM | POA: Diagnosis not present

## 2016-08-09 NOTE — Progress Notes (Signed)
Chief Complaint  Patient presents with  . Hemorrhoids    Sunday had a bowel movement and toilet paper showed a great deal of blood and it was painful. Used maxi pad for the next few days and there was some blood. Normal bowel movement, slight strain. Feels bloated.    Bowel movement on Sunday not particularly hard, reports she strained a little. No pain with bowel movement, but she noticed a lot of bright red blood on the toilet paper after wiping.  She had some light spotting for the next couple of days . She noticed some light pink with wiping after subsequent bowel movement.  No blood with today's bowel movement.  She has known hemorrhoids, but has never seen any bleeding before.   She has been straining some lately.  She has been having 2 small bowel movements daily.  But every now and then she gets backed up and strains. She used a preparation H cooling cream for 2 days, which helped some. She feels like something is "hanging out that shouldn't be".  She has also been having a little bloating intermittently, none today  PMH, PSH, SH reviewed  Outpatient Encounter Prescriptions as of 08/09/2016  Medication Sig  . aspirin 81 MG tablet Take 81 mg by mouth daily.  Marland Kitchen atenolol (TENORMIN) 25 MG tablet Take 1 tablet (25 mg total) by mouth daily.  . Calcium Carbonate-Vitamin D (CALTRATE 600+D) 600-400 MG-UNIT per tablet Take 1 tablet by mouth 2 (two) times daily.    . Cholecalciferol (VITAMIN D) 2000 UNITS tablet Take 2,000 Units by mouth daily.    . fexofenadine (ALLEGRA) 180 MG tablet Take 180 mg by mouth daily.    . fluticasone (FLONASE) 50 MCG/ACT nasal spray PLACE TWO SPRAYS IN EACH NOSTRIL DAILY  . GLUCOSAMINE PO Take 2,000 mg by mouth 2 (two) times daily.    Marland Kitchen levothyroxine (SYNTHROID, LEVOTHROID) 25 MCG tablet TAKE ONE TABLET BY MOUTH DAILY  . losartan-hydrochlorothiazide (HYZAAR) 100-25 MG tablet Take 1 tablet by mouth daily.  . Multiple Vitamins-Minerals (CENTRUM SILVER PO) Take 1  tablet by mouth daily.    . Multiple Vitamins-Minerals (PRESERVISION/LUTEIN) CAPS Take 1 capsule by mouth 2 (two) times daily.    . simvastatin (ZOCOR) 20 MG tablet Take 1 tablet (20 mg total) by mouth daily.   No facility-administered encounter medications on file as of 08/09/2016.    Allergies  Allergen Reactions  . Percocet [Oxycodone-Acetaminophen] Other (See Comments)    Upset stomach   ROS: no fever, chills, urinary complaints, URI, chest pain, shortness of breath. No other concerns except per HPI.  PHYSICAL EXAM:  BP 112/78 (BP Location: Left Arm, Patient Position: Sitting, Cuff Size: Normal)   Pulse 68   Ht 5' 4.75" (1.645 m)   Wt 139 lb 3.2 oz (63.1 kg)   BMI 23.34 kg/m    Pleasant, talkative female in no distress Abdomen: soft, nontender External rectal area: Notable for some external hemorrhoidal swelling on the L>R.  The anterior portion has some skin irritation/superficial ulceration/scab, and has recently bled. This was slightly tender to touch. The posterior portion appears blue, is nontender. No active bleeding noted. No thrombosis noted. No fissures noted.  ASSESSMENT/PLAN:  External hemorrhoid, bleeding    You have external hemorrhoids.  One of them seems inflamed, irritated.  Be sure to clean the area very gently--using a wet wipe rather than dry toilet paper.  Consider doing a sitz bath after bowel movements to help clean and soak the area.  Avoiding straining by eating a high fiber diet and drinking a lot of water can help prevent inflammation of the hemorrhoids. Continue to use the external creams as needed.

## 2016-08-09 NOTE — Patient Instructions (Signed)
You have external hemorrhoids.  One of them seems inflamed, irritated.  Be sure to clean the area very gently--using a wet wipe rather than dry toilet paper.  Consider doing a sitz bath after bowel movements to help clean and soak the area.  Avoiding straining by eating a high fiber diet and drinking a lot of water can help prevent inflammation of the hemorrhoids. Continue to use the external creams as needed.  About Hemorrhoids  Hemorrhoids are swollen veins in the lower rectum and anus.  Also called piles, hemorrhoids are a common problem.  Hemorrhoids may be internal (inside the rectum) or external (around the anus).  Internal Hemorrhoids  Internal hemorrhoids are often painless, but they rarely cause bleeding.  The internal veins may stretch and fall down (prolapse) through the anus to the outside of the body.  The veins may then become irritated and painful.  External Hemorrhoids  External hemorrhoids can be easily seen or felt around the anal opening.  They are under the skin around the anus.  When the swollen veins are scratched or broken by straining, rubbing or wiping they sometimes bleed.  How Hemorrhoids Occur  Veins in the rectum and around the anus tend to swell under pressure.  Hemorrhoids can result from increased pressure in the veins of your anus or rectum.  Some sources of pressure are:   Straining to have a bowel movement because of constipation  Waiting too long to have a bowel movement  Coughing and sneezing often  Sitting for extended periods of time, including on the toilet  Diarrhea  Obesity  Trauma or injury to the anus  Some liver diseases  Stress  Family history of hemorrhoids  Pregnancy  Pregnant women should try to avoid becoming constipated, because they are more likely to have hemorrhoids during pregnancy.  In the last trimester of pregnancy, the enlarged uterus may press on blood vessels and causes hemorrhoids.  In addition, the strain of  childbirth sometimes causes hemorrhoids after the birth.  Symptoms of Hemorrhoids  Some symptoms of hemorrhoids include:  Swelling and/or a tender lump around the anus  Itching, mild burning and bleeding around the anus  Painful bowel movements with or without constipation  Bright red blood covering the stool, on toilet paper or in the toilet bowel.   Symptoms usually go away within a few days.  Always talk to your doctor about any bleeding to make sure it is not from some other causes.  Diagnosing and Treating Hemorrhoids  Diagnosis is made by an examination by your healthcare provider.  Special test can be performed by your doctor.    Most cases of hemorrhoids can be treated with:  High-fiber diet: Eat more high-fiber foods, which help prevent constipation.  Ask for more detailed fiber information on types and sources of fiber from your healthcare provider.  Fluids: Drink plenty of water.  This helps soften bowel movements so they are easier to pass.  Sitz baths and cold packs: Sitting in lukewarm water two or three times a day for 15 minutes cleanses the anal area and may relieve discomfort.  If the water is too hot, swelling around the anus will get worse.  Placing a cloth-covered ice pack on the anus for ten minutes four times a day can also help reduce swelling.  Gently pushing a prolapsed hemorrhoid back inside after the bath or ice pack can be helpful.  Medications: For mild discomfort, your healthcare provider may suggest over-the-counter pain medication or prescribe  a cream or ointment for topical use.  The cream may contain witch hazel, zinc oxide or petroleum jelly.  Medicated suppositories are also a treatment option.  Always consult your doctor before applying medications or creams.  Procedures and surgeries: There are also a number of procedures and surgeries to shrink or remove hemorrhoids in more serious cases.  Talk to your physician about these options.  You can  often prevent hemorrhoids or keep them from becoming worse by maintaining a healthy lifestyle.  Eat a fiber-rich diet of fruits, vegetables and whole grains.  Also, drink plenty of water and exercise regularly.   2007, [employer] Doc.30

## 2016-09-05 ENCOUNTER — Telehealth: Payer: Self-pay | Admitting: *Deleted

## 2016-09-05 ENCOUNTER — Other Ambulatory Visit: Payer: Self-pay | Admitting: Family Medicine

## 2016-09-05 NOTE — Telephone Encounter (Signed)
[employer] sent fax to let us know that mylan generic is being switched to [employer].

## 2016-09-13 DIAGNOSIS — H2513 Age-related nuclear cataract, bilateral: Secondary | ICD-10-CM | POA: Diagnosis not present

## 2016-09-13 DIAGNOSIS — D3132 Benign neoplasm of left choroid: Secondary | ICD-10-CM | POA: Diagnosis not present

## 2016-09-13 DIAGNOSIS — H1852 Epithelial (juvenile) corneal dystrophy: Secondary | ICD-10-CM | POA: Diagnosis not present

## 2016-09-13 DIAGNOSIS — H353132 Nonexudative age-related macular degeneration, bilateral, intermediate dry stage: Secondary | ICD-10-CM | POA: Diagnosis not present

## 2016-10-16 ENCOUNTER — Other Ambulatory Visit: Payer: Self-pay | Admitting: Family Medicine

## 2016-10-16 DIAGNOSIS — E782 Mixed hyperlipidemia: Secondary | ICD-10-CM

## 2016-10-18 DIAGNOSIS — M79671 Pain in right foot: Secondary | ICD-10-CM | POA: Diagnosis not present

## 2016-10-18 DIAGNOSIS — M79672 Pain in left foot: Secondary | ICD-10-CM | POA: Diagnosis not present

## 2016-10-18 DIAGNOSIS — L84 Corns and callosities: Secondary | ICD-10-CM | POA: Diagnosis not present

## 2016-10-21 NOTE — Progress Notes (Signed)
Chief Complaint  Patient presents with  . Hypothyroidism    fasting med check.    Patient presents for 6 month follow-up.  She admits that she has been upset/stressed--daughter is currently staying with her during marital problems (and granddaughters).    Last seen in May with bleeding external hemorrhoid. She increased fiber in her diet, and hasn't had further problems.  HTN: Blood pressure isn't checked regularly, although she has a monitor at home. Denies dizziness or headaches, chest pain, muscle cramps, edema. Compliant with medications.   Hyperlipidemia follow-up: Patient is reportedly following a low-fat, low cholesterol diet. Compliant with medications and denies medication side effects. Lab Results  Component Value Date   CHOL 176 04/23/2016   HDL 62 04/23/2016   LDLCALC 89 04/23/2016   TRIG 127 04/23/2016   CHOLHDL 2.8 04/23/2016    Hypothyroidism: Over the past several months she noticed less hair growth on her legs (shaving less often); denies thinning of hair.  No skin changes, bowel changes, energy is good.  Compliant with taking her medication, taking it on an empty stomach (30 minutes prior to eating). She separates the thyroid medication from her calcium and vitamins by at least 2 hours.She has had some weight loss. Due for recheck of TSH. Lab Results  Component Value Date   TSH 1.62 09/15/2015   Impaired fasting glucose:She cut back on eating ice cream (now just twice a month or less), limits her sweets/sugar. Has small amounts of peppermint patty or other candy bar, small portions, makes it last a long time.  She is working with Joneen Boers, and is now on a regular exercise regimen. (hasn't met with her very recently, since daughter with her) Lab Results  Component Value Date   HGBA1C 5.5 04/23/2016    PMH, PSH, SH reviewed  Past Medical History:  Diagnosis Date  . Allergy    seasonal allergies  . Basal cell carcinoma 2007, 2015, 2017   face and  shoulder (Dr. Derrel Nip)   2017-chin  . Breast cancer ([facility]) 1989   R breast-microinvasive papillary(lumpectomy and XRT)  . Colon polyp 09/2000  . Diverticulosis    seen on colonoscopy  . Elevated liver function tests 06/2009   galllstones and dilated CBD--s/p ERCP and chole  . Gilbert syndrome   . Hyperlipidemia   . Hypertension   . Hyperthyroidism   . IBS (irritable bowel syndrome) 1984  . Macular degeneration 2008   mild  . Radial head fracture 02/2014   left, nondisplaced  . Subarachnoid hemorrhage ([place]) 02/2014   after fall  . Subdural hematoma ([facility]) 06/2008   Dr.Cram  . Subdural hematoma ([facility]) 02/2014   after fall (Dr. Vertell Limber)    Past Surgical History:  Procedure Laterality Date  . ABDOMINAL HYSTERECTOMY     bladder repair  . BREAST LUMPECTOMY Right 1989   right breast  . ERCP  07/2009   sphincterotomy  . fracture left foot  01/2007   5th metatarsal-treated with boot  . HAMMER TOE SURGERY Right 05/2004   right foot  . LAPAROSCOPIC CHOLECYSTECTOMY  07/2009  . TOTAL KNEE ARTHROPLASTY Right 2000  . TOTAL KNEE ARTHROPLASTY Left 2004   Dr. Wynelle Link  . TUBAL LIGATION      Social History   Social History  . Marital status: Married    Spouse name: N/A  . Number of children: 2  . Years of education: N/A   Occupational History  .  Retired   Social History Main Topics  .  Smoking status: Former Smoker    Packs/day: 0.10    Years: 3.00    Quit date: 03/27/1963  . Smokeless tobacco: Never Used  . Alcohol use 0.0 oz/week     Comment: 1 glass of wine per week or less  . Drug use: No  . Sexual activity: Yes    Partners: Male   Other Topics Concern  . Not on file   Social History Narrative   Retired Statistician.  1 daughter in [place] ([name]), and 1 daughter in [place]). 4 granddaughters    Family History  Problem Relation Age of Onset  . Leukemia Mother   . Ovarian cancer Mother        ??? hysterectomy age 63's  . Cancer Mother        leukemia;  possible ovarian cancer (hysterectomy in her 52's)  . Coronary artery disease Father   . Hypertension Father   . Heart disease Father        onset late 44's  . Coronary artery disease Brother 60       coronary artery bypass grafting, pacemaker  . Heart disease Brother        CABG in 60's, pacemaker  . Diabetes Paternal Uncle   . Diabetes Paternal Grandmother   . Breast cancer Daughter 75  . Fibroids Daughter   . Breast cancer Maternal Aunt   . Cancer Maternal Aunt        breast  . Cancer Cousin        breast  . Breast cancer Cousin     Outpatient Encounter Prescriptions as of 10/22/2016  Medication Sig  . aspirin 81 MG tablet Take 81 mg by mouth daily.  Marland Kitchen atenolol (TENORMIN) 25 MG tablet Take 1 tablet (25 mg total) by mouth daily.  . Calcium Carbonate-Vitamin D (CALTRATE 600+D) 600-400 MG-UNIT per tablet Take 1 tablet by mouth 2 (two) times daily.    . Cholecalciferol (VITAMIN D) 2000 UNITS tablet Take 2,000 Units by mouth daily.    . fexofenadine (ALLEGRA) 180 MG tablet Take 180 mg by mouth daily.    . fluticasone (FLONASE) 50 MCG/ACT nasal spray PLACE TWO SPRAYS IN EACH NOSTRIL DAILY  . GLUCOSAMINE PO Take 2,000 mg by mouth 2 (two) times daily.    Marland Kitchen levothyroxine (SYNTHROID, LEVOTHROID) 25 MCG tablet TAKE ONE TABLET BY MOUTH DAILY  . losartan-hydrochlorothiazide (HYZAAR) 100-25 MG tablet Take 1 tablet by mouth daily.  . Multiple Vitamins-Minerals (CENTRUM SILVER PO) Take 1 tablet by mouth daily.    . Multiple Vitamins-Minerals (PRESERVISION/LUTEIN) CAPS Take 1 capsule by mouth 2 (two) times daily.    . simvastatin (ZOCOR) 20 MG tablet TAKE ONE TABLET BY MOUTH DAILY  . [DISCONTINUED] atenolol (TENORMIN) 25 MG tablet Take 1 tablet (25 mg total) by mouth daily.  . [DISCONTINUED] losartan-hydrochlorothiazide (HYZAAR) 100-25 MG tablet Take 1 tablet by mouth daily.   No facility-administered encounter medications on file as of 10/22/2016.     Allergies  Allergen Reactions  .  Percocet [Oxycodone-Acetaminophen] Other (See Comments)    Upset stomach    ROS: no fever, chills, headaches, dizziness, URI symptoms, cough, shortness of breath.  No chest pain, palpitations, nausea, vomiting, heartburn, bowel changes, skin/hair changes. Some stress, as per HPI, no depression or insomnia. No bleeding, bruising, rash.  No further hemorrhoidal bleeding. Mild allergy symptoms, controlled with Allegra. 5# weight loss.  Normal appetite, doesn't snack  PHYSICAL EXAM:  BP 140/80 (BP Location: Left Arm, Patient Position: Sitting, Cuff  Size: Normal)   Pulse 60   Ht 5' 4.75" (1.645 m)   Wt 134 lb 12.8 oz (61.1 kg)   BMI 22.61 kg/m   Wt Readings from Last 3 Encounters:  10/22/16 134 lb 12.8 oz (61.1 kg)  08/09/16 139 lb 3.2 oz (63.1 kg)  04/23/16 139 lb 12.8 oz (63.4 kg)   Repeat BP was higher, somewhat excitable, upset in discussing daughter's situation, wanting to downsize, not sure of what to do, etc (anxious). Well developed, talkative female in no distress HEENT: PERRL, EOMI, conjunctiva clear.OP clear. Neck: no lymphadenopathy, thyromegaly or carotid bruit. No spinal tenderness.  Heart: regular rate and rhythm Lungs: clear bilaterally Abdomen: soft, nontender, no organomegaly or mass Back: no CVA or spinal tenderness Extremities: No edema, 2+ pulses Skin: normal turgor, no rashes, lesions Psych: normal mood, affect, hygiene and grooming. Somewhat anxious, but full range of affect Neuro: alert and oriented. Normal strength, sensation, cranial nerves, gait, DTRs.   ASSESSMENT/PLAN:  Essential hypertension, benign - borderline today. reviewed low sodium diet, resume regular exercise, monitor BP at home and drop of list in 4-6 wks (f/u if high) - Plan: Comprehensive metabolic panel, atenolol (TENORMIN) 25 MG tablet, losartan-hydrochlorothiazide (HYZAAR) 100-25 MG tablet  Mixed hyperlipidemia - Plan: Comprehensive metabolic panel  Impaired fasting glucose - Plan:  Comprehensive metabolic panel, Hemoglobin A1c  Hypothyroidism, unspecified type - Plan: TSH  Medication monitoring encounter - Plan: Comprehensive metabolic panel, TSH  Essential hypertension, benign - Plan: Comprehensive metabolic panel, atenolol (TENORMIN) 25 MG tablet, losartan-hydrochlorothiazide (HYZAAR) 100-25 MG tablet   c-met, TSH, A1c F/u 6 months CPE/AWV  Discussed stress reduction--being there for her family, but also having time for herself.   Your blood pressure was elevated today--but we were also discussing things that were upsetting you, which can contribute to this. I'd like for you to monitor your blood pressure periodically at home, to make sure that they really are in a normal range. Goal blood pressure 110-135/60-80.   Please check your blood pressure 1-2 times/week and record on the sheet given. Please drop off or mail [place] a copy sometime in the next 6 weeks. If your blood pressure is consistently over 135-140/85-90, please schedule a visit to discuss.  Continue to get regular exercise--this helps with stress reduction and lower pressure. You lost some weight--be sure to eat enough. Limit the sodium in your diet.

## 2016-10-22 ENCOUNTER — Ambulatory Visit (INDEPENDENT_AMBULATORY_CARE_PROVIDER_SITE_OTHER): Payer: Medicare Other | Admitting: Family Medicine

## 2016-10-22 ENCOUNTER — Encounter: Payer: Self-pay | Admitting: Family Medicine

## 2016-10-22 VITALS — BP 140/80 | HR 60 | Ht 64.75 in | Wt 134.8 lb

## 2016-10-22 DIAGNOSIS — E782 Mixed hyperlipidemia: Secondary | ICD-10-CM

## 2016-10-22 DIAGNOSIS — R7301 Impaired fasting glucose: Secondary | ICD-10-CM

## 2016-10-22 DIAGNOSIS — I10 Essential (primary) hypertension: Secondary | ICD-10-CM | POA: Diagnosis not present

## 2016-10-22 DIAGNOSIS — Z5181 Encounter for therapeutic drug level monitoring: Secondary | ICD-10-CM | POA: Diagnosis not present

## 2016-10-22 DIAGNOSIS — E039 Hypothyroidism, unspecified: Secondary | ICD-10-CM

## 2016-10-22 MED ORDER — ATENOLOL 25 MG PO TABS: 25.0000 mg | ORAL_TABLET | Freq: Every day | ORAL | 1 refills | Status: DC

## 2016-10-22 MED ORDER — LOSARTAN POTASSIUM-HCTZ 100-25 MG PO TABS: 1.0000 | ORAL_TABLET | Freq: Every day | ORAL | 1 refills | Status: DC

## 2016-10-22 NOTE — Patient Instructions (Signed)
Your blood pressure was elevated today--but we were also discussing things that were upsetting you, which can contribute to this. I'd like for you to monitor your blood pressure periodically at home, to make sure that they really are in a normal range. Goal blood pressure 110-135/60-80.   Please check your blood pressure 1-2 times/week and record on the sheet given. Please drop off or mail [place] a copy sometime in the next 6 weeks. If your blood pressure is consistently over 135-140/85-90, please schedule a visit to discuss.  Continue to get regular exercise--this helps with stress reduction and lower pressure. You lost some weight--be sure to eat enough. Limit the sodium in your diet.    DASH Eating Plan DASH stands for "Dietary Approaches to Stop Hypertension." The DASH eating plan is a healthy eating plan that has been shown to reduce high blood pressure (hypertension). It may also reduce your risk for type 2 diabetes, heart disease, and stroke. The DASH eating plan may also help with weight loss. What are tips for following this plan? General guidelines  Avoid eating more than 2,300 mg (milligrams) of salt (sodium) a day. If you have hypertension, you may need to reduce your sodium intake to 1,500 mg a day.  Limit alcohol intake to no more than 1 drink a day for nonpregnant women and 2 drinks a day for men. One drink equals 12 oz of beer, 5 oz of wine, or 1 oz of hard liquor.  Work with your health care provider to maintain a healthy body weight or to lose weight. Ask what an ideal weight is for you.  Get at least 30 minutes of exercise that causes your heart to beat faster (aerobic exercise) most days of the week. Activities may include walking, swimming, or biking.  Work with your health care provider or diet and nutrition specialist (dietitian) to adjust your eating plan to your individual calorie needs. Reading food labels  Check food labels for the amount of sodium per serving.  Choose foods with less than 5 percent of the Daily Value of sodium. Generally, foods with less than 300 mg of sodium per serving fit into this eating plan.  To find whole grains, look for the word "whole" as the first word in the ingredient list. Shopping  Buy products labeled as "low-sodium" or "no salt added."  Buy fresh foods. Avoid canned foods and premade or frozen meals. Cooking  Avoid adding salt when cooking. Use salt-free seasonings or herbs instead of table salt or sea salt. Check with your health care provider or pharmacist before using salt substitutes.  Do not fry foods. Cook foods using healthy methods such as baking, boiling, grilling, and broiling instead.  Cook with heart-healthy oils, such as olive, canola, soybean, or sunflower oil. Meal planning   Eat a balanced diet that includes: ? 5 or more servings of fruits and vegetables each day. At each meal, try to fill half of your plate with fruits and vegetables. ? Up to 6-8 servings of whole grains each day. ? Less than 6 oz of lean meat, poultry, or fish each day. A 3-oz serving of meat is about the same size as a deck of cards. One egg equals 1 oz. ? 2 servings of low-fat dairy each day. ? A serving of nuts, seeds, or beans 5 times each week. ? Heart-healthy fats. Healthy fats called Omega-3 fatty acids are found in foods such as flaxseeds and coldwater fish, like sardines, salmon, and mackerel.  Limit  how much you eat of the following: ? Canned or prepackaged foods. ? Food that is high in trans fat, such as fried foods. ? Food that is high in saturated fat, such as fatty meat. ? Sweets, desserts, sugary drinks, and other foods with added sugar. ? Full-fat dairy products.  Do not salt foods before eating.  Try to eat at least 2 vegetarian meals each week.  Eat more home-cooked food and less restaurant, buffet, and fast food.  When eating at a restaurant, ask that your food be prepared with less salt or no  salt, if possible. What foods are recommended? The items listed may not be a complete list. Talk with your dietitian about what dietary choices are best for you. Grains Whole-grain or whole-wheat bread. Whole-grain or whole-wheat pasta. Brown rice. [place]. Bulgur. Whole-grain and low-sodium cereals. Pita bread. Low-fat, low-sodium crackers. Whole-wheat flour tortillas. Vegetables Fresh or frozen vegetables (raw, steamed, roasted, or grilled). Low-sodium or reduced-sodium tomato and vegetable juice. Low-sodium or reduced-sodium tomato sauce and tomato paste. Low-sodium or reduced-sodium canned vegetables. Fruits All fresh, dried, or frozen fruit. Canned fruit in natural juice (without added sugar). Meat and other protein foods Skinless chicken or [place]. Ground chicken or [place]. Pork with fat trimmed off. Fish and seafood. Egg whites. Dried beans, peas, or lentils. Unsalted nuts, nut butters, and seeds. Unsalted canned beans. Lean cuts of beef with fat trimmed off. Low-sodium, lean deli meat. Dairy Low-fat (1%) or fat-free (skim) milk. Fat-free, low-fat, or reduced-fat cheeses. Nonfat, low-sodium ricotta or cottage cheese. Low-fat or nonfat yogurt. Low-fat, low-sodium cheese. Fats and oils Soft margarine without trans fats. Vegetable oil. Low-fat, reduced-fat, or light mayonnaise and salad dressings (reduced-sodium). Canola, safflower, olive, soybean, and sunflower oils. Avocado. Seasoning and other foods Herbs. Spices. Seasoning mixes without salt. Unsalted popcorn and pretzels. Fat-free sweets. What foods are not recommended? The items listed may not be a complete list. Talk with your dietitian about what dietary choices are best for you. Grains Baked goods made with fat, such as croissants, muffins, or some breads. Dry pasta or rice meal packs. Vegetables Creamed or fried vegetables. Vegetables in a cheese sauce. Regular canned vegetables (not low-sodium or reduced-sodium). Regular  canned tomato sauce and paste (not low-sodium or reduced-sodium). Regular tomato and vegetable juice (not low-sodium or reduced-sodium). Angie Fava. Olives. Fruits Canned fruit in a light or heavy syrup. Fried fruit. Fruit in cream or butter sauce. Meat and other protein foods Fatty cuts of meat. Ribs. Fried meat. Berniece Salines. Sausage. Bologna and other processed lunch meats. Salami. Fatback. Hotdogs. Bratwurst. Salted nuts and seeds. Canned beans with added salt. Canned or smoked fish. Whole eggs or egg yolks. Chicken or [place] with skin. Dairy Whole or 2% milk, cream, and half-and-half. Whole or full-fat cream cheese. Whole-fat or sweetened yogurt. Full-fat cheese. Nondairy creamers. Whipped toppings. Processed cheese and cheese spreads. Fats and oils Butter. Stick margarine. Lard. Shortening. Ghee. Bacon fat. Tropical oils, such as coconut, palm kernel, or palm oil. Seasoning and other foods Salted popcorn and pretzels. Onion salt, garlic salt, seasoned salt, table salt, and sea salt. Worcestershire sauce. Tartar sauce. Barbecue sauce. Teriyaki sauce. Soy sauce, including reduced-sodium. Steak sauce. Canned and packaged gravies. Fish sauce. Oyster sauce. Cocktail sauce. Horseradish that you find on the shelf. Ketchup. Mustard. Meat flavorings and tenderizers. Bouillon cubes. Hot sauce and Tabasco sauce. Premade or packaged marinades. Premade or packaged taco seasonings. Relishes. Regular salad dressings. Where to find more information:  National Heart, Lung, and Blood Institute:  https://wilson-eaton.com/  American Heart Association: www.heart.org Summary  The DASH eating plan is a healthy eating plan that has been shown to reduce high blood pressure (hypertension). It may also reduce your risk for type 2 diabetes, heart disease, and stroke.  With the DASH eating plan, you should limit salt (sodium) intake to 2,300 mg a day. If you have hypertension, you may need to reduce your sodium intake to 1,500 mg a  day.  When on the DASH eating plan, aim to eat more fresh fruits and vegetables, whole grains, lean proteins, low-fat dairy, and heart-healthy fats.  Work with your health care provider or diet and nutrition specialist (dietitian) to adjust your eating plan to your individual calorie needs. This information is not intended to replace advice given to you by your health care provider. Make sure you discuss any questions you have with your health care provider. Document Released: 03/01/2011 Document Revised: 03/05/2016 Document Reviewed: 03/05/2016 Elsevier Interactive Patient Education  2017 [employer].

## 2016-10-23 ENCOUNTER — Encounter: Payer: Self-pay | Admitting: Family Medicine

## 2016-10-23 ENCOUNTER — Telehealth: Payer: Self-pay | Admitting: Family Medicine

## 2016-10-23 LAB — COMPREHENSIVE METABOLIC PANEL
ALBUMIN: 4.6 g/dL (ref 3.6–5.1)
ALT: 16 U/L (ref 6–29)
AST: 23 U/L (ref 10–35)
Alkaline Phosphatase: 83 U/L (ref 33–130)
BUN: 22 mg/dL (ref 7–25)
CHLORIDE: 100 mmol/L (ref 98–110)
CO2: 28 mmol/L (ref 20–31)
Calcium: 10 mg/dL (ref 8.6–10.4)
Creat: 0.77 mg/dL (ref 0.60–0.88)
Glucose, Bld: 98 mg/dL (ref 65–99)
POTASSIUM: 3.9 mmol/L (ref 3.5–5.3)
Sodium: 141 mmol/L (ref 135–146)
Total Bilirubin: 1.4 mg/dL — ABNORMAL HIGH (ref 0.2–1.2)
Total Protein: 6.3 g/dL (ref 6.1–8.1)

## 2016-10-23 LAB — HEMOGLOBIN A1C
Hgb A1c MFr Bld: 5.5 % (ref <5.7)
Mean Plasma Glucose: 111 mg/dL

## 2016-10-23 LAB — TSH: TSH: 1.07 mIU/L

## 2016-10-23 NOTE — Telephone Encounter (Signed)
Pt came in and stated that at her appt yesterday she failed to ask you about something. She states she would like to a Gene test to see if she carries the breast cancer gene. She states she had this done several years ago for ovarian cancer and would like to have done for breast cancer as well. Pt can be reached at [phone].

## 2016-10-23 NOTE — Telephone Encounter (Signed)
Patient said she will call back if she wants to get the referral

## 2016-10-23 NOTE — Telephone Encounter (Signed)
We cannot do those.  She has to go through a genetics consult (at the cancer center) to determine which genetic testing is appropriate.  She has h/o breast cancer. [place] for referral if she is interested.

## 2016-12-03 ENCOUNTER — Other Ambulatory Visit: Payer: Self-pay | Admitting: Family Medicine

## 2016-12-05 ENCOUNTER — Telehealth: Payer: Self-pay | Admitting: Genetics

## 2016-12-05 NOTE — Telephone Encounter (Signed)
Patient rescheduled genetic counseling apt to 10:15 on 9/27.

## 2016-12-06 ENCOUNTER — Encounter: Payer: Medicare Other | Admitting: Genetics

## 2016-12-06 ENCOUNTER — Other Ambulatory Visit: Payer: Medicare Other

## 2016-12-14 DIAGNOSIS — L821 Other seborrheic keratosis: Secondary | ICD-10-CM | POA: Diagnosis not present

## 2016-12-14 DIAGNOSIS — L814 Other melanin hyperpigmentation: Secondary | ICD-10-CM | POA: Diagnosis not present

## 2016-12-14 DIAGNOSIS — L570 Actinic keratosis: Secondary | ICD-10-CM | POA: Diagnosis not present

## 2016-12-14 DIAGNOSIS — Z85828 Personal history of other malignant neoplasm of skin: Secondary | ICD-10-CM | POA: Diagnosis not present

## 2016-12-14 DIAGNOSIS — L304 Erythema intertrigo: Secondary | ICD-10-CM | POA: Diagnosis not present

## 2016-12-20 ENCOUNTER — Other Ambulatory Visit: Payer: Medicare Other

## 2016-12-20 ENCOUNTER — Ambulatory Visit (HOSPITAL_BASED_OUTPATIENT_CLINIC_OR_DEPARTMENT_OTHER): Payer: Medicare Other | Admitting: Genetic Counselor

## 2016-12-20 ENCOUNTER — Encounter: Payer: Self-pay | Admitting: Genetic Counselor

## 2016-12-20 DIAGNOSIS — Z803 Family history of malignant neoplasm of breast: Secondary | ICD-10-CM | POA: Diagnosis not present

## 2016-12-20 DIAGNOSIS — Z1379 Encounter for other screening for genetic and chromosomal anomalies: Secondary | ICD-10-CM

## 2016-12-20 DIAGNOSIS — Z853 Personal history of malignant neoplasm of breast: Secondary | ICD-10-CM | POA: Diagnosis not present

## 2016-12-20 HISTORY — DX: Encounter for other screening for genetic and chromosomal anomalies: Z13.79

## 2016-12-20 NOTE — Progress Notes (Signed)
[facility]      Initial Visit   Patient Name: Courtney Castro Patient DOB: 01-08-1934 Patient Age: 83 y.o. Encounter Date: 12/20/2016  Referring Provider: Rita Ohara, MD  Primary Care Provider: Rita Ohara, MD  Reason for Visit: Evaluate for hereditary susceptibility to cancer    Assessment and Plan:  . [name]'s history is somewhat suggestive of a hereditary predisposition to cancer. Her mother had a TAH/BSO in her 57s and may be the reason she did not develop breast and/or ovarian cancer. [name] had negative BRCA1/BRCA2 sequencing and 5 -site rearrangement testing only. She did not have comprehensive testing by today's standards. She meets NCCN criteria for genetic testing.  . Testing is recommended to determine whether she has a pathogenic mutation that will impact her screening and risk-reduction for cancer. A negative result will be reassuring.  . [name] wished to pursue genetic testing and a blood sample will be sent for analysis of the 83 genes on [facility]'s Multi-Cancer panel (ALK, APC, ATM, AXIN2, BAP1, BARD1, BLM, BMPR1A, BRCA1, BRCA2, BRIP1, CASR, CDC73, CDH1, CDK4, CDKN1B, CDKN1C, CDKN2A, CEBPA, CHEK2, CTNNA1, DICER1, DIS3L2, EGFR, EPCAM, FH, FLCN, GATA2, GPC3, GREM1, HOXB13, HRAS, KIT, MAX, MEN1, MET, MITF, MLH1, MSH2, MSH3, MSH6, MUTYH, NBN, NF1, NF2, NTHL1, PALB2, PDGFRA, PHOX2B, PMS2, POLD1, POLE, POT1, PRKAR1A, PTCH1, PTEN, RAD50, RAD51C, RAD51D, RB1, RECQL4, RET, RUNX1, SDHA, SDHAF2, SDHB, SDHC, SDHD, SMAD4, SMARCA4, SMARCB1, SMARCE1, STK11, SUFU, TERC, TERT, TMEM127, TP53, TSC1, TSC2, VHL, WRN, WT1).   . Results should be available in approximately 2-4 weeks, at which point we will contact her and address implications for her as well as address genetic testing for at-risk family members, if needed.      Dr. Jana Hakim was available for questions concerning this case. Total time spent by me in face-to-face counseling was  approximately 35 minutes.   _____________________________________________________________________   History of Present Illness: Ms. LENNAN Castro, a 83 y.o. female, is being seen again at the [facility] due to a personal and family history of breast cancer. She was previously seen on 04/17/2010 and had undergone BRCA genetic testing. She presents to clinic today to update her family and  discuss having additional genetic testing.  Courtney Castro has a history of breast cancer at the age of 38. She is s/p lumpectomy and radiation. She had a hysterectomy in 1993, but stated her ovaries remain intact. She had a colonoscopy about 5 years ago and stated she is to return soon for another screening.   Past Medical History:  Diagnosis Date  . Allergy    seasonal allergies  . Basal cell carcinoma 2007, 2015, 2017   face and shoulder (Dr. Derrel Nip)   2017-chin  . Breast cancer ([place]) 1989   R breast-microinvasive papillary(lumpectomy and XRT)  . Colon polyp 09/2000  . Diverticulosis    seen on colonoscopy  . Elevated liver function tests 06/2009   galllstones and dilated CBD--s/p ERCP and chole  . Family history of breast cancer   . Gilbert syndrome   . Hyperlipidemia   . Hypertension   . Hyperthyroidism   . IBS (irritable bowel syndrome) 1984  . Macular degeneration 2008   mild  . Radial head fracture 02/2014   left, nondisplaced  . Subarachnoid hemorrhage ([place]) 02/2014   after fall  . Subdural hematoma ([place]) 06/2008   Dr.Cram  . Subdural hematoma ([place]) 02/2014   after fall (Dr. Vertell Limber)  Past Surgical History:  Procedure Laterality Date  . ABDOMINAL HYSTERECTOMY     bladder repair  . BREAST LUMPECTOMY Right 1989   right breast  . ERCP  07/2009   sphincterotomy  . fracture left foot  01/2007   5th metatarsal-treated with boot  . HAMMER TOE SURGERY Right 05/2004   right foot  . LAPAROSCOPIC CHOLECYSTECTOMY  07/2009  . TOTAL KNEE ARTHROPLASTY Right  2000  . TOTAL KNEE ARTHROPLASTY Left 2004   Dr. Wynelle Link  . TUBAL LIGATION      Social History   Social History  . Marital status: Married    Spouse name: N/A  . Number of children: 2  . Years of education: N/A   Occupational History  .  Retired   Social History Main Topics  . Smoking status: Former Smoker    Packs/day: 0.10    Years: 3.00    Quit date: 03/27/1963  . Smokeless tobacco: Never Used  . Alcohol use 0.0 oz/week     Comment: 1 glass of wine per week or less  . Drug use: No  . Sexual activity: Yes    Partners: Male   Other Topics Concern  . Not on file   Social History Narrative   Retired Statistician.  1 daughter in [place] (Courtney Castro), and 1 daughter in [place]). 4 granddaughters     Family History:  During the visit, a 4-generation pedigree was obtained. Family tree will be scanned in the Media tab in Epic  Significant diagnoses include the following:  Family History  Problem Relation Age of Onset  . Leukemia Mother        deceased 80; TAH/BSO in 33s  . Coronary artery disease Father   . Hypertension Father   . Heart disease Father        onset late 74's  . Coronary artery disease Brother 60       coronary artery bypass grafting, pacemaker  . Heart disease Brother        CABG in 60's, pacemaker  . Diabetes Paternal Uncle   . Diabetes Paternal Grandmother   . Breast cancer Daughter 36  . Fibroids Daughter   . Breast cancer Maternal Aunt 40       deceased 37s  . Breast cancer Cousin 107       daughter of mat aunt with BC at 52; currently 71  . Cancer Maternal Uncle        dx 69s; unk. type  . Breast cancer Maternal Aunt        dx 47s; deceased 25s    Additionally, Ms. Stankovich had only one brother. Her mother had a total of 2 sisters and 3 brothers. Her father died at 29 and he had only one brother.  Ms. Ringle ancestry is Caucasian - NOS. There is no known Jewish ancestry and no consanguinity.  Discussion: We reviewed the  characteristics, features and inheritance patterns of hereditary cancer syndromes. We discussed her risk of harboring a mutation in the context of her personal and family history. We discussed that her small paternal family and paucity of women makes risk assessment challenging. We discussed the process of genetic testing, insurance coverage and implications of results: positive, negative and variant of unknown significance (VUS).    Ms. Bartosiewicz questions were answered to her satisfaction today and she is welcome to call with any additional questions or concerns. Thank you for the referral and allowing [place] to share in the care of your  patient.    Steele Berg, MS, [place] Certified Genetic Counselor phone: [phone] [email or web address]   ______________________________________________________________________ For Office Staff:  Number of people involved in session: 1 Was an Intern/ student involved with case: no

## 2016-12-21 ENCOUNTER — Other Ambulatory Visit: Payer: Self-pay | Admitting: Family Medicine

## 2016-12-21 DIAGNOSIS — Z1231 Encounter for screening mammogram for malignant neoplasm of breast: Secondary | ICD-10-CM

## 2016-12-27 DIAGNOSIS — L84 Corns and callosities: Secondary | ICD-10-CM | POA: Diagnosis not present

## 2016-12-27 DIAGNOSIS — I872 Venous insufficiency (chronic) (peripheral): Secondary | ICD-10-CM | POA: Diagnosis not present

## 2016-12-28 ENCOUNTER — Ambulatory Visit: Payer: Self-pay | Admitting: Genetic Counselor

## 2016-12-28 ENCOUNTER — Encounter: Payer: Self-pay | Admitting: Genetic Counselor

## 2016-12-28 DIAGNOSIS — Z1379 Encounter for other screening for genetic and chromosomal anomalies: Secondary | ICD-10-CM

## 2016-12-28 NOTE — Progress Notes (Signed)
[facility]            Genetic Test Results    Patient Name: Courtney Castro Patient DOB: [birth year] Patient Age: 83 y.o. Encounter Date: 12/28/2016  Referring Provider: Rita Ohara, MD  Primary Care Provider: Rita Ohara, MD  Ms. Courtney Castro was called today to discuss genetic test results. Please see the Genetics note from her visit on 12/20/2016 for a detailed discussion of her personal and family history.  Genetic Testing: At the time of Courtney Castro's visit, she decided to pursue genetic testing of multiple genes associated with hereditary predisposition to cancer. Testing included sequencing and deletion/duplication analysis. 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  A copy of the genetic test report will be scanned into Epic under the media tab.  The genes analyzed were the 83 genes on [facility]'s Multi-Cancer panel (ALK, APC, ATM, AXIN2, BAP1, BARD1, BLM, BMPR1A, BRCA1, BRCA2, BRIP1, CASR, CDC73, CDH1, CDK4, CDKN1B, CDKN1C, CDKN2A, CEBPA, CHEK2, CTNNA1, DICER1, DIS3L2, EGFR, EPCAM, FH, FLCN, GATA2, GPC3, GREM1, HOXB13, HRAS, KIT, MAX, MEN1, MET, MITF, MLH1, MSH2, MSH3, MSH6, MUTYH, NBN, NF1, NF2, NTHL1, PALB2, PDGFRA, PHOX2B, PMS2, POLD1, POLE, POT1, PRKAR1A, PTCH1, PTEN, RAD50, RAD51C, RAD51D, RB1, RECQL4, RET, RUNX1, SDHA, SDHAF2, SDHB, SDHC, SDHD, SMAD4, SMARCA4, SMARCB1, SMARCE1, STK11, SUFU, TERC, TERT, TMEM127, TP53, TSC1, TSC2, VHL, WRN, WT1).  CHEK2 Cancer Risks: It is important to note that the studies on cancer risk associated with the CHEK2 gene are generally limited to a single CHEK2 mutation that is common in the [place] population. This is the mutation found in Courtney Castro.  We discussed that pathogenic mutations in CHEK2 have been shown to increase the risk of breast cancer to 20-44% by age 6 (Cybulski 2011, Piedra 2008). The risk has been shown to be dependent on the family history of breast  cancer. CHEK2 mutations have also been associated with an increased risk of colorectal cancer, but specific risk estimates are not fully defined yet. There are other conflicting studies suggesting that mutations in CHEK2 increase the risk for various other cancers, but results have not been consistent. CHEK2 mutations have been associated with an increased risk of prostate cancer in men.  Medical Management: We discussed the medical management guidelines from the [place] (NCCN) for women with a CHEK2 mutation.  Genetic / Familial High-Risk Assessment: Breast and Ovarian (V2.2019):  Breast Cancer: Starting at age 55, or 5-10 years prior to the earliest diagnosis of breast cancer in the family (whichever is earlier): Annual mammogram with consideration of tomosynthesis and consideration of breast MRI with contrast. Women are recommended to be followed by a breast health specialist for a clinical breast exam twice per year. There is insufficient evidence at this time to recommend risk-reducing bilateral mastectomies based on genetic test results alone, but this remains a choice that some women may pursue based on their family history.  Genetic / Familial High-Risk Assessment: Colorectal (V1.2018):  Colon Cancer: For those without a personal history of colorectal cancer and no first-degree relative with colorectal cancer, colonoscopy screening every 5 years, beginning at age 37. This may need to be more frequent if polyps are found.  Other: Courtney Castro is recommended to continue having a yearly physical and/or gynecologic exam and continue to follow population screening guidelines for other cancers.  Family Members: It is important that Courtney Castro informs her relatives of this result. Her daughter (age 96)  has a 50% chance to have inherited this mutation. Her daughter who had breast cancer actually does not have this mutation as she was tested using a 25-gene panel. Her  brother had a 50% chance to have inherited this mutation. Since he is deceased, testing needs to be offered to his adult children. They are recommended to speak with a genetic counselor prior to any testing. A genetic counselor can be located at www.nsgc.org.   We encouraged Courtney Castro to remain in contact with us on an annual basis so we can update her personal and family histories, and let her know of advances in cancer genetics that may benefit the family. Our contact number was provided. Courtney Castro's questions were answered to her satisfaction today, and she knows she is welcome to call anytime with additional questions.     Ofri Leitner, MS, CGC Certified Genetic Counselor phone: [phone]  

## 2016-12-31 ENCOUNTER — Other Ambulatory Visit: Payer: Self-pay | Admitting: Family Medicine

## 2016-12-31 DIAGNOSIS — J309 Allergic rhinitis, unspecified: Secondary | ICD-10-CM

## 2017-01-14 DIAGNOSIS — H11823 Conjunctivochalasis, bilateral: Secondary | ICD-10-CM | POA: Diagnosis not present

## 2017-01-14 DIAGNOSIS — H524 Presbyopia: Secondary | ICD-10-CM | POA: Diagnosis not present

## 2017-01-14 DIAGNOSIS — H5211 Myopia, right eye: Secondary | ICD-10-CM | POA: Diagnosis not present

## 2017-01-14 DIAGNOSIS — H2513 Age-related nuclear cataract, bilateral: Secondary | ICD-10-CM | POA: Diagnosis not present

## 2017-01-14 DIAGNOSIS — H52203 Unspecified astigmatism, bilateral: Secondary | ICD-10-CM | POA: Diagnosis not present

## 2017-01-15 ENCOUNTER — Other Ambulatory Visit: Payer: Self-pay | Admitting: Family Medicine

## 2017-01-15 DIAGNOSIS — E782 Mixed hyperlipidemia: Secondary | ICD-10-CM

## 2017-02-01 ENCOUNTER — Ambulatory Visit
Admission: RE | Admit: 2017-02-01 | Discharge: 2017-02-01 | Disposition: A | Discharge status: Home / Self Care | Payer: Medicare Other | Source: Ambulatory Visit | Attending: Family Medicine | Admitting: Family Medicine

## 2017-02-01 DIAGNOSIS — Z1231 Encounter for screening mammogram for malignant neoplasm of breast: Secondary | ICD-10-CM

## 2017-02-04 ENCOUNTER — Other Ambulatory Visit: Payer: Self-pay | Admitting: Family Medicine

## 2017-02-04 DIAGNOSIS — R928 Other abnormal and inconclusive findings on diagnostic imaging of breast: Secondary | ICD-10-CM

## 2017-02-11 ENCOUNTER — Ambulatory Visit
Admission: RE | Admit: 2017-02-11 | Discharge: 2017-02-11 | Disposition: A | Discharge status: Home / Self Care | Payer: Medicare Other | Source: Ambulatory Visit | Attending: Family Medicine | Admitting: Family Medicine

## 2017-02-11 ENCOUNTER — Other Ambulatory Visit: Payer: Self-pay | Admitting: Family Medicine

## 2017-02-11 DIAGNOSIS — R922 Inconclusive mammogram: Secondary | ICD-10-CM | POA: Diagnosis not present

## 2017-02-11 DIAGNOSIS — N6489 Other specified disorders of breast: Secondary | ICD-10-CM | POA: Diagnosis not present

## 2017-02-11 DIAGNOSIS — R928 Other abnormal and inconclusive findings on diagnostic imaging of breast: Secondary | ICD-10-CM

## 2017-02-18 ENCOUNTER — Ambulatory Visit
Admission: RE | Admit: 2017-02-18 | Discharge: 2017-02-18 | Disposition: A | Discharge status: Home / Self Care | Payer: Medicare Other | Source: Ambulatory Visit | Attending: Family Medicine | Admitting: Family Medicine

## 2017-02-18 ENCOUNTER — Other Ambulatory Visit: Payer: Self-pay | Admitting: Family Medicine

## 2017-02-18 DIAGNOSIS — R928 Other abnormal and inconclusive findings on diagnostic imaging of breast: Secondary | ICD-10-CM

## 2017-02-18 DIAGNOSIS — N641 Fat necrosis of breast: Secondary | ICD-10-CM | POA: Diagnosis not present

## 2017-02-18 DIAGNOSIS — N6311 Unspecified lump in the right breast, upper outer quadrant: Secondary | ICD-10-CM | POA: Diagnosis not present

## 2017-02-20 ENCOUNTER — Encounter: Payer: Self-pay | Admitting: Family Medicine

## 2017-02-28 ENCOUNTER — Other Ambulatory Visit: Payer: Self-pay | Admitting: Family Medicine

## 2017-03-22 ENCOUNTER — Emergency Department (HOSPITAL_COMMUNITY)
Admission: EM | Admit: 2017-03-22 | Discharge: 2017-03-22 | Disposition: A | Discharge status: Home / Self Care | Payer: Medicare Other | Attending: Emergency Medicine | Admitting: Emergency Medicine

## 2017-03-22 ENCOUNTER — Other Ambulatory Visit: Payer: Self-pay

## 2017-03-22 ENCOUNTER — Emergency Department (HOSPITAL_COMMUNITY): Payer: Medicare Other

## 2017-03-22 ENCOUNTER — Encounter (HOSPITAL_COMMUNITY): Payer: Self-pay | Admitting: Emergency Medicine

## 2017-03-22 DIAGNOSIS — Y939 Activity, unspecified: Secondary | ICD-10-CM | POA: Insufficient documentation

## 2017-03-22 DIAGNOSIS — Z23 Encounter for immunization: Secondary | ICD-10-CM | POA: Insufficient documentation

## 2017-03-22 DIAGNOSIS — W1830XA Fall on same level, unspecified, initial encounter: Secondary | ICD-10-CM | POA: Insufficient documentation

## 2017-03-22 DIAGNOSIS — Z7982 Long term (current) use of aspirin: Secondary | ICD-10-CM | POA: Diagnosis not present

## 2017-03-22 DIAGNOSIS — Y999 Unspecified external cause status: Secondary | ICD-10-CM | POA: Insufficient documentation

## 2017-03-22 DIAGNOSIS — S01511A Laceration without foreign body of lip, initial encounter: Secondary | ICD-10-CM | POA: Diagnosis not present

## 2017-03-22 DIAGNOSIS — Z96653 Presence of artificial knee joint, bilateral: Secondary | ICD-10-CM | POA: Insufficient documentation

## 2017-03-22 DIAGNOSIS — Y929 Unspecified place or not applicable: Secondary | ICD-10-CM | POA: Insufficient documentation

## 2017-03-22 DIAGNOSIS — I10 Essential (primary) hypertension: Secondary | ICD-10-CM | POA: Diagnosis not present

## 2017-03-22 DIAGNOSIS — S0990XA Unspecified injury of head, initial encounter: Secondary | ICD-10-CM | POA: Diagnosis not present

## 2017-03-22 DIAGNOSIS — Z79899 Other long term (current) drug therapy: Secondary | ICD-10-CM | POA: Insufficient documentation

## 2017-03-22 DIAGNOSIS — W19XXXA Unspecified fall, initial encounter: Secondary | ICD-10-CM

## 2017-03-22 DIAGNOSIS — E039 Hypothyroidism, unspecified: Secondary | ICD-10-CM | POA: Insufficient documentation

## 2017-03-22 DIAGNOSIS — Z87891 Personal history of nicotine dependence: Secondary | ICD-10-CM | POA: Diagnosis not present

## 2017-03-22 MED ORDER — TETANUS-DIPHTH-ACELL PERTUSSIS 5-2.5-18.5 LF-MCG/0.5 IM SUSP
0.5000 mL | Freq: Once | INTRAMUSCULAR | Status: AC
  Administered 2017-03-22: 0.5 mL via INTRAMUSCULAR
  Filled 2017-03-22: qty 0.5

## 2017-03-22 MED ORDER — LIDOCAINE HCL (PF) 1 % IJ SOLN
5.0000 mL | Freq: Once | INTRAMUSCULAR | Status: DC
  Filled 2017-03-22: qty 5

## 2017-03-22 NOTE — ED Triage Notes (Signed)
Pt in after fall this morning, has left lower lip laceration. Pt states she was trying to catch her husband from falling, thinks she bit her lip while landing on carpet. No LOC, VSS, no thinners, bleeding controlled

## 2017-03-22 NOTE — ED Provider Notes (Signed)
[facility] EMERGENCY DEPARTMENT Provider Note   CSN: [id number] Arrival date & time: 03/22/17  1034     History   Chief Complaint Chief Complaint  Patient presents with  . Fall  . Lip Laceration    HPI Courtney Castro is a 83 y.o. female history of subdural hematoma and subarachnoid hemorrhage who presents the emergency department complaining of a lip laceration status post mechanical fall just prior to arrival.  Patient states that her husband was having some difficulty with disequilibrium and lightheadedness, she was trying to assist him, he began to fall she tried to support him and fell as well.  She did not have any dizziness, lightheadedness, chest pain, or shortness of breath prior to fall or at present.  She did hit the back of her head, no loss of consciousness. She believes that she cut her lower lip with her tooth during the fall.  She applied some Chapstick and had the bleeding fairly well controlled.  Having some discomfort at the site of the laceration, no alleviating/aggravating factors.  No other complaints at this time.  Denies numbness, weakness, change in vision, headache, neck pain, or back pain.  No nausea or vomiting since fall.  No confusion last tetanus was in 2012. HPI  Past Medical History:  Diagnosis Date  . Allergy    seasonal allergies  . Basal cell carcinoma 2007, 2015, 2017   face and shoulder (Dr. Derrel Nip)   2017-chin  . Breast cancer ([place]) 1989   R breast-microinvasive papillary(lumpectomy and XRT)  . Colon polyp 09/2000  . Diverticulosis    seen on colonoscopy  . Elevated liver function tests 06/2009   galllstones and dilated CBD--s/p ERCP and chole  . Family history of breast cancer   . Genetic testing 12/20/2016   Multi-Cancer panel (83 genes) @ [facility] - CHEK2 mutation  . Gilbert syndrome   . Hyperlipidemia   . Hypertension   . Hyperthyroidism   . IBS (irritable bowel syndrome) 1984  . Macular degeneration 2008   mild  . Radial head fracture 02/2014   left, nondisplaced  . Subarachnoid hemorrhage ([place]) 02/2014   after fall  . Subdural hematoma ([place]) 06/2008   Dr.Cram  . Subdural hematoma ([place]) 02/2014   after fall (Dr. Vertell Limber)    Patient Active Problem List   Diagnosis Date Noted  . Genetic testing 12/20/2016  . Family history of breast cancer   . Subarachnoid hemorrhage ([place]) 02/26/2014  . Shoulder pain 05/26/2012  . Impaired fasting glucose 02/19/2012  . Essential hypertension, benign 01/31/2011  . Hypothyroidism 01/31/2011  . Mixed hyperlipidemia 01/31/2011  . Allergic rhinitis 01/31/2011    Past Surgical History:  Procedure Laterality Date  . ABDOMINAL HYSTERECTOMY     bladder repair  . BREAST LUMPECTOMY Right 1989   right breast  . ERCP  07/2009   sphincterotomy  . fracture left foot  01/2007   5th metatarsal-treated with boot  . HAMMER TOE SURGERY Right 05/2004   right foot  . LAPAROSCOPIC CHOLECYSTECTOMY  07/2009  . TOTAL KNEE ARTHROPLASTY Right 2000  . TOTAL KNEE ARTHROPLASTY Left 2004   Dr. Wynelle Link  . TUBAL LIGATION      OB History    Gravida Para Term Preterm AB Living   '2 2       2   ' SAB TAB Ectopic Multiple Live Births                   Home Medications  Prior to Admission medications   Medication Sig Start Date End Date Taking? Authorizing Provider  aspirin 81 MG tablet Take 81 mg by mouth daily.    [provider]  atenolol (TENORMIN) 25 MG tablet Take 1 tablet (25 mg total) by mouth daily. 10/22/16   Rita Ohara, MD  Calcium Carbonate-Vitamin D (CALTRATE 600+D) 600-400 MG-UNIT per tablet Take 1 tablet by mouth 2 (two) times daily.      [provider]  Cholecalciferol (VITAMIN D) 2000 UNITS tablet Take 2,000 Units by mouth daily.      [provider]  fexofenadine (ALLEGRA) 180 MG tablet Take 180 mg by mouth daily.      [provider]  fluticasone (FLONASE) 50 MCG/ACT nasal spray SPRAY TWO SPRAYS IN EACH NOSTRIL ONCE  DAILY 12/31/16   Rita Ohara, MD  GLUCOSAMINE PO Take 2,000 mg by mouth 2 (two) times daily.      [provider]  levothyroxine (SYNTHROID, LEVOTHROID) 25 MCG tablet TAKE ONE TABLET BY MOUTH DAILY 02/28/17   Rita Ohara, MD  losartan-hydrochlorothiazide (HYZAAR) 100-25 MG tablet Take 1 tablet by mouth daily. 10/22/16   Rita Ohara, MD  Multiple Vitamins-Minerals (CENTRUM SILVER PO) Take 1 tablet by mouth daily.      [provider]  Multiple Vitamins-Minerals (PRESERVISION/LUTEIN) CAPS Take 1 capsule by mouth 2 (two) times daily.      [provider]  simvastatin (ZOCOR) 20 MG tablet TAKE ONE TABLET BY MOUTH DAILY 01/15/17   Denita Lung, MD    Family History Family History  Problem Relation Age of Onset  . Leukemia Mother        deceased 77; TAH/BSO in 64s  . Coronary artery disease Father   . Hypertension Father   . Heart disease Father        onset late 51's  . Coronary artery disease Brother 60       coronary artery bypass grafting, pacemaker  . Heart disease Brother        CABG in 60's, pacemaker  . Diabetes Paternal Uncle   . Diabetes Paternal Grandmother   . Breast cancer Daughter 77  . Fibroids Daughter   . Breast cancer Maternal Aunt 40       deceased 69s  . Breast cancer Cousin 55       daughter of mat aunt with BC at 20; currently 67  . Cancer Maternal Uncle        dx 96s; unk. type  . Breast cancer Maternal Aunt        dx 65s; deceased 23s    Social History Social History   Tobacco Use  . Smoking status: Former Smoker    Packs/day: 0.10    Years: 3.00    Pack years: 0.30    Last attempt to quit: 03/27/1963    Years since quitting: 54.0  . Smokeless tobacco: Never Used  Substance Use Topics  . Alcohol use: Yes    Alcohol/week: 0.0 oz    Comment: 1 glass of wine per week or less  . Drug use: No     Allergies   Percocet [oxycodone-acetaminophen]   Review of Systems Review of Systems  Constitutional: Negative for chills and  fever.  Eyes: Negative for visual disturbance.  Respiratory: Negative for shortness of breath.   Cardiovascular: Negative for chest pain.  Gastrointestinal: Negative for nausea and vomiting.  Skin: Positive for wound (lip laceration that is uncomfortable).  Neurological: Negative for dizziness, syncope, weakness, light-headedness,  numbness and headaches.  Psychiatric/Behavioral: Negative for confusion.  All other systems reviewed and are negative.    Physical Exam Updated Vital Signs BP 136/76 (BP Location: Left Arm)   Pulse 64   Temp 98.5 F (36.9 C) (Oral)   Resp 18   Ht '5\' 5"'  (1.651 m)   Wt 61.2 kg (135 lb)   SpO2 99%   BMI 22.47 kg/m   Physical Exam  Constitutional: She appears well-developed and well-nourished. No distress.  HENT:  Head: Normocephalic and atraumatic. Head is without raccoon's eyes and without Battle's sign.  Right Ear: No hemotympanum.  Left Ear: No hemotympanum.  Mouth/Throat: Oropharynx is clear and moist. Lacerations (There is a 0.5cm laceration to the lower lip just left of midline, no active bleeding) present.  Eyes: Conjunctivae and EOM are normal. Pupils are equal, round, and reactive to light. Right eye exhibits no discharge. Left eye exhibits no discharge.  Neck: No spinous process tenderness present.  Cardiovascular: Normal rate and regular rhythm.  No murmur heard. Pulses:      Radial pulses are 2+ on the right side, and 2+ on the left side.  Pulmonary/Chest: Breath sounds normal. No respiratory distress. She has no wheezes. She has no rales.  Abdominal: Soft. She exhibits no distension. There is no tenderness.  Musculoskeletal:  Back: No midline vertebral tenderness.   Neurological:  Alert. Clear speech. No facial droop. CNIII-XII are intact. Bilateral upper and lower extremities' sensation intact to sharp and dull touch. 5/5 grip strength bilaterally. 5/5 plantar and dorsi flexion bilaterally. Gait is normal.  Skin: Skin is warm and dry.  No rash noted.  Psychiatric: She has a normal mood and affect. Her behavior is normal.  Nursing note and vitals reviewed.   ED Treatments / Results  Labs (all labs ordered are listed, but only abnormal results are displayed) Labs Reviewed - No data to display  EKG  EKG Interpretation None       Radiology Ct Head Wo Contrast  Result Date: 03/22/2017 CLINICAL DATA:  Fall.  Head injury.  History of breast cancer EXAM: CT HEAD WITHOUT CONTRAST TECHNIQUE: Contiguous axial images were obtained from the base of the skull through the vertex without intravenous contrast. COMPARISON:  CT head 04/13/2014 FINDINGS: Brain: Cerebral volume normal for age. Negative for hydrocephalus. Negative for acute infarct, hemorrhage, or mass lesion. No midline shift Vascular: Negative for hyperdense vessel. Atherosclerotic calcification. Skull: Negative for fracture Sinuses/Orbits: Mild mucosal edema paranasal sinuses.  Normal orbit Other: None IMPRESSION: No acute abnormality. Electronically Signed   By: Franchot Gallo M.D.   On: 03/22/2017 13:30    Procedures Procedures (including critical care time)  Medications Ordered in ED Medications  Tdap (BOOSTRIX) injection 0.5 mL (0.5 mLs Intramuscular Given 03/22/17 1331)     Initial Impression / Assessment and Plan / ED Course  I have reviewed the triage vital signs and the nursing notes.  Pertinent labs & imaging results that were available during my care of the patient were reviewed by me and considered in my medical decision making (see chart for details).  Patient presents status post mechanical fall with lip laceration. Patient is nontoxic appearing with stable vital signs. Given age with head injury will evaluate with head CT- this was negative for acute abnormality. Patient without neurologic deficits on exam, she feels at baseline, doubt bleed. No vertebral tenderness, no neck/back pain, doubt fracture or dislocation.  Lip laceration explored without  evidence of foreign body.  Tdap updated. Irrigation and  repair with absorbable sutures by resident Dr. Cyndia Skeeters, please see procedure note for further details. Patient does not have comorbidities to effect normal wound healing.  I discussed results, treatment plan, need for PCP follow-up, and return precautions with the patient. Provided opportunity for questions, patient confirmed understanding and is in agreement with plan.     Final Clinical Impressions(s) / ED Diagnoses   Final diagnoses:  Fall, initial encounter  Lip laceration, initial encounter    ED Discharge Orders    None       Amaryllis Dyke, PA-C 03/22/17 1807    Pattricia Boss, MD 03/25/17 1736

## 2017-03-22 NOTE — ED Provider Notes (Signed)
LACERATION REPAIR Performed by: Mercy Riding Authorized by: Mercy Riding Consent: Verbal consent obtained. Risks and benefits: risks, benefits and alternatives were discussed Consent given by: patient Patient identity confirmed: provided demographic data Prepped and Draped in normal sterile fashion Wound explored  Laceration Location: lower lip  Laceration Length: 0.5 cm  No Foreign Bodies seen or palpated  Anesthesia: local infiltration  Local anesthetic: lidocaine 0.1 %  Anesthetic total: 3 ml  Irrigation method: syringe Amount of cleaning: irrigation with saline solution  Skin closure: 0.5 absorbable vicryl  Number of sutures: three, one deep and two superficial  Technique: interrupted   Patient tolerance: Patient tolerated the procedure well with no immediate complications.   Mercy Riding, MD 03/22/17 1257    Pattricia Boss, MD 03/25/17 [phone]

## 2017-03-22 NOTE — Discharge Instructions (Signed)
You were seen in the emergency department following a fall with a lip laceration. Your Head CT scan did not show any new abnormalities. 3 absorbable sutures were placed in your lip, these will not need to be removed. Please see the hand out attached for mouth laceration care. Your tetanus was updated today.   Follow-up with your primary care provider in 1 week if you have any concerns about the lip laceration.  You may also return to the emergency department for this.  Return to the emergency department for any new or worsening symptoms including change in your vision, numbness, weakness, subsequent injury, fever, chills, or active bleeding or purulent drainage from laceration.

## 2017-03-25 ENCOUNTER — Other Ambulatory Visit: Payer: Self-pay

## 2017-03-27 NOTE — Progress Notes (Signed)
Chief Complaint  Patient presents with  . Follow-up    ER follow up. Injured lip and hit back of head. Feels fine.     Patient presents for ER f/u.  She had a lip laceration on 12/29 when her husband was having syncopal event, tried to prevent his fall. She had stitches placed.  She had hit the back of her head when she fell.  CT was performed in ER and was normal.  Swelling has improved significantly.  Lip is not painful, just feels dry and crusted.  Denies any headaches or neuro symptoms.    Husband was hospitalized through 12/31, with afib with RVR.  PMH, PSH, SH reviewed  Outpatient Encounter Medications as of 03/28/2017  Medication Sig  . aspirin 81 MG tablet Take 81 mg by mouth daily.  Marland Kitchen atenolol (TENORMIN) 25 MG tablet Take 1 tablet (25 mg total) by mouth daily.  . Calcium Carbonate-Vitamin D (CALTRATE 600+D) 600-400 MG-UNIT per tablet Take 1 tablet by mouth 2 (two) times daily.    . Cholecalciferol (VITAMIN D) 2000 UNITS tablet Take 2,000 Units by mouth daily.    . fexofenadine (ALLEGRA) 180 MG tablet Take 180 mg by mouth daily.    . fluticasone (FLONASE) 50 MCG/ACT nasal spray SPRAY TWO SPRAYS IN EACH NOSTRIL ONCE DAILY  . GLUCOSAMINE PO Take 2,000 mg by mouth 2 (two) times daily.    Marland Kitchen levothyroxine (SYNTHROID, LEVOTHROID) 25 MCG tablet TAKE ONE TABLET BY MOUTH DAILY  . losartan-hydrochlorothiazide (HYZAAR) 100-25 MG tablet Take 1 tablet by mouth daily.  . Multiple Vitamins-Minerals (CENTRUM SILVER PO) Take 1 tablet by mouth daily.    . Multiple Vitamins-Minerals (PRESERVISION/LUTEIN) CAPS Take 1 capsule by mouth 2 (two) times daily.    . simvastatin (ZOCOR) 20 MG tablet TAKE ONE TABLET BY MOUTH DAILY   No facility-administered encounter medications on file as of 03/28/2017.    Allergies  Allergen Reactions  . Percocet [Oxycodone-Acetaminophen] Other (See Comments)    Upset stomach   ROS: no headaches, dizziness, numbness, tingling, URI symptoms, fever, chills, swelling,  bleeding, bruising, rash or other concerns  PHYSICAL EXAM:  BP (!) 150/88   Pulse 72   Ht 5' 4.75" (1.645 m)   Wt 138 lb 12.8 oz (63 kg)   BMI 23.28 kg/m   138/70 on repeat by MD, [place]  Well appearing, pleasant, talkative female, in no distress HEENT: conjunctiva and sclera are clear, EOMI. OP is clear Left lower lip--scabbed area.  Cleaned with water and gauze, some scab was removed.  Suture (knot) visible, still adherent. Neck: no lymphadenopathy or mass Heart: regular rate and rhythm Lungs: clear Extremities: no edema Skin: no bruising or rashes noted  ASSESSMENT/PLAN:  Lip laceration, initial encounter  Essential hypertension, benign - BP high initially, improved on recheck (very excitable)   Keep the area clean. Use either vaseline or an antibacterial ointment.  I suspect that your scab (and part of the suture that is external) will come loose in the next couple of days.  The lip heals quickly, and after a week, if the suture comes out, that is fine. Return if bleeding, swelling, pain, redness or crusting/oozing/yellow drainage.

## 2017-03-28 ENCOUNTER — Encounter: Payer: Self-pay | Admitting: Family Medicine

## 2017-03-28 ENCOUNTER — Ambulatory Visit (INDEPENDENT_AMBULATORY_CARE_PROVIDER_SITE_OTHER): Payer: Medicare Other | Admitting: Family Medicine

## 2017-03-28 VITALS — BP 138/70 | HR 72 | Ht 64.75 in | Wt 138.8 lb

## 2017-03-28 DIAGNOSIS — S01511A Laceration without foreign body of lip, initial encounter: Secondary | ICD-10-CM

## 2017-03-28 DIAGNOSIS — I10 Essential (primary) hypertension: Secondary | ICD-10-CM | POA: Diagnosis not present

## 2017-03-28 NOTE — Patient Instructions (Signed)
  Keep the area clean. Use either vaseline or an antibacterial ointment.  I suspect that your scab (and part of the suture that is external) will come loose in the next couple of days.  The lip heals quickly, and after a week, if the suture comes out, that is fine. Return if bleeding, swelling, pain, redness or crusting/oozing/yellow drainage.

## 2017-04-11 ENCOUNTER — Other Ambulatory Visit: Payer: Self-pay | Admitting: Family Medicine

## 2017-04-11 DIAGNOSIS — E782 Mixed hyperlipidemia: Secondary | ICD-10-CM

## 2017-04-22 ENCOUNTER — Other Ambulatory Visit: Payer: Self-pay | Admitting: Family Medicine

## 2017-04-22 DIAGNOSIS — I10 Essential (primary) hypertension: Secondary | ICD-10-CM

## 2017-04-23 ENCOUNTER — Other Ambulatory Visit: Payer: Self-pay | Admitting: Family Medicine

## 2017-04-23 DIAGNOSIS — I10 Essential (primary) hypertension: Secondary | ICD-10-CM

## 2017-05-19 NOTE — Progress Notes (Signed)
Chief Complaint  Patient presents with  . Medicare Wellness    fasting AWV exam with pelvic exam. No eye exam today is having one soon as well as cataract surgery.     Courtney Castro is a 84 y.o. female who presents for annual physical, Medicare wellness visit and follow-up on chronic medical conditions.  She has the following concerns:  HTN: Blood pressure isn't checked regularly, just sporadically.Recalls 120-130's/60-70's that she can recall.  Denies dizziness or headaches, chest pain, muscle cramps, edema. Compliant with medications.   Hyperlipidemia follow-up: Patient is reportedly following a low-fat, low cholesterol diet. Compliant with medications and denies medication side effects. She is fasting for labs today.   Hypothyroidism: She continues to notice decreased hair growth on her legs (shaving less often); denies thinning of hair.  No skin changes, bowel changes, energy is good.  Compliant with taking her medication, taking it on an empty stomach (30 minutes prior to eating). She separates the thyroid medication from her calcium and vitamins by at least 2 hours. Lab Results  Component Value Date   TSH 1.07 10/22/2016   Impaired fasting glucose:She limits her sweets/sugar. Has small amounts of peppermint patty or other candy bar, small portions, makes it last a long time.  She was working with Joneen Boers (trainer), hadn't seen her for a couple of months (related to Laura's mother being ill). She continues to exercise regularly. (but weather dependent). Lab Results  Component Value Date   HGBA1C 5.5 10/22/2016   History of R breast cancer, s/p lumpectomy and radiation.  She last had mammo in 01/2017, requiring biopsy. Mammo showed: IMPRESSION: Right breast upper outer quadrant lumpectomy site with increasing density, with probable sonographic correlation at 11 o'clock 1 cm from the nipple. Biopsy showed fat necrosis with dense fibrosis (benign). She had genetic  testing, which revealed CHEK2 mutation-- colonoscopies recommended every 5 years, due next in June.  Allergies: year-round, and well controlled with flonase and allegra daily.   Immunization History  Administered Date(s) Administered  . Influenza Split 12/11/2009, 01/02/2011, 12/21/2011  . Influenza, High Dose Seasonal PF 12/05/2012, 12/17/2013, 12/13/2014, 12/22/2015, 01/01/2017  . Pneumococcal Conjugate-13 03/04/2014  . Pneumococcal Polysaccharide-23 11/07/2001, 01/31/2011  . Td 12/18/2004  . Tdap 01/31/2011, 03/22/2017  . Zoster 08/25/2007  . Zoster Recombinat (Shingrix) 07/10/2016, 10/18/2016   Last Pap smear: 2011, s/p hysterectomy for benign reasons  Last mammogram: 01/2017 Last colonoscopy:  June 2014 with Dr. Watt Climes. Normal per pt, repeat in 5 years Last DEXA: 05/2016 Dentist: twice yearly  Ophtho: twice yearly (once yearly for each doctor) Exercise: Hasn't been as regular with exercise.  Previously did home exercise regimen includes using resistance bands, 3# hand weights. Walking 20 minutes when the weather allows.   Other doctors caring for patient include: Ophtho: Dr. Susa Simmonds, Dr. Cordelia Pen (retinal specialist), at [facility] Dentist: Dr. Mariana Arn GI: Dr. Watt Climes Derm: Dr. Derrel Nip Podiatry: Dr. Fritzi Mandes Ortho: Dr. Wynelle Link, Dr. Rhona Raider Neurosurg: Dr. Weston Settle, Dr. Vertell Limber   Depression screen: negative Fall screen--fell 02/2017 when trying to help her husband who was falling/fainting Functional Status-- some vision trouble, with reading, scheduled for cataract surgery--see full screen in chart.  End of Life Discussion: Patient hasnot yet filled out living will and healthcare power of attorney forms that were given at last visit.  She still has them at home and plans to do.  Past Medical History:  Diagnosis Date  . Allergy    seasonal allergies  . Basal cell carcinoma 2007, 2015, 2017  face and shoulder (Dr. Derrel Nip)   2017-chin  . Breast cancer ([place]) 1989    R breast-microinvasive papillary(lumpectomy and XRT)  . Colon polyp 09/2000  . Diverticulosis    seen on colonoscopy  . Elevated liver function tests 06/2009   galllstones and dilated CBD--s/p ERCP and chole  . Family history of breast cancer   . Genetic testing 12/20/2016   Multi-Cancer panel (83 genes) @ [facility] - CHEK2 mutation  . Gilbert syndrome   . Hyperlipidemia   . Hypertension   . Hyperthyroidism   . IBS (irritable bowel syndrome) 1984  . Macular degeneration 2008   mild  . Radial head fracture 02/2014   left, nondisplaced  . Subarachnoid hemorrhage ([place]) 02/2014   after fall  . Subdural hematoma ([place]) 06/2008   Dr.Cram  . Subdural hematoma ([place]) 02/2014   after fall (Dr. Vertell Limber)    Past Surgical History:  Procedure Laterality Date  . ABDOMINAL HYSTERECTOMY     bladder repair  . BREAST LUMPECTOMY Right 1989   right breast  . ERCP  07/2009   sphincterotomy  . fracture left foot  01/2007   5th metatarsal-treated with boot  . HAMMER TOE SURGERY Right 05/2004   right foot  . LAPAROSCOPIC CHOLECYSTECTOMY  07/2009  . TOTAL KNEE ARTHROPLASTY Right 2000  . TOTAL KNEE ARTHROPLASTY Left 2004   Dr. Wynelle Link  . TUBAL LIGATION      Social History   Socioeconomic History  . Marital status: Married    Spouse name: Not on file  . Number of children: 2  . Years of education: Not on file  . Highest education level: Not on file  Social Needs  . Financial resource strain: Not on file  . Food insecurity - worry: Not on file  . Food insecurity - inability: Not on file  . Transportation needs - medical: Not on file  . Transportation needs - non-medical: Not on file  Occupational History    Employer: RETIRED  Tobacco Use  . Smoking status: Former Smoker    Packs/day: 0.10    Years: 3.00    Pack years: 0.30    Last attempt to quit: 03/27/1963    Years since quitting: 54.1  . Smokeless tobacco: Never Used  Substance and Sexual Activity  . Alcohol use: Yes     Alcohol/week: 0.0 oz    Comment: 1 glass of wine per week or less  . Drug use: No  . Sexual activity: Yes    Partners: Male  Other Topics Concern  . Not on file  Social History Narrative   Retired Statistician.  1 daughter in [place] ([name]), and 1 daughter in [place]). 4 granddaughters    Family History  Problem Relation Age of Onset  . Leukemia Mother        deceased 59; TAH/BSO in 50s  . Coronary artery disease Father   . Hypertension Father   . Heart disease Father        onset late 17's  . Coronary artery disease Brother 60       coronary artery bypass grafting, pacemaker  . Heart disease Brother        CABG in 60's, pacemaker  . Diabetes Paternal Uncle   . Diabetes Paternal Grandmother   . Breast cancer Daughter 64  . Fibroids Daughter   . Breast cancer Maternal Aunt 40       deceased 79s  . Breast cancer Cousin 57  daughter of mat aunt with BC at 106; currently 44  . Cancer Maternal Uncle        dx 30s; unk. type  . Breast cancer Maternal Aunt        dx 82s; deceased 32s    Outpatient Encounter Medications as of 05/20/2017  Medication Sig  . aspirin 81 MG tablet Take 81 mg by mouth daily.  Marland Kitchen atenolol (TENORMIN) 25 MG tablet Take 1 tablet (25 mg total) by mouth daily.  . Calcium Carbonate-Vitamin D (CALTRATE 600+D) 600-400 MG-UNIT per tablet Take 1 tablet by mouth 2 (two) times daily.    . Cholecalciferol (VITAMIN D) 2000 UNITS tablet Take 2,000 Units by mouth daily.    . fexofenadine (ALLEGRA) 180 MG tablet Take 180 mg by mouth daily.    . fluticasone (FLONASE) 50 MCG/ACT nasal spray SPRAY TWO SPRAYS IN EACH NOSTRIL ONCE DAILY  . GLUCOSAMINE PO Take 2,000 mg by mouth 2 (two) times daily.    Marland Kitchen levothyroxine (SYNTHROID, LEVOTHROID) 25 MCG tablet TAKE ONE TABLET BY MOUTH DAILY  . losartan-hydrochlorothiazide (HYZAAR) 100-25 MG tablet Take 1 tablet by mouth daily.  . Multiple Vitamins-Minerals (CENTRUM SILVER PO) Take 1 tablet by mouth daily.    . Multiple  Vitamins-Minerals (PRESERVISION/LUTEIN) CAPS Take 1 capsule by mouth 2 (two) times daily.    . simvastatin (ZOCOR) 20 MG tablet TAKE ONE TABLET BY MOUTH EVERY NIGHT AT BEDTIME  . [DISCONTINUED] atenolol (TENORMIN) 25 MG tablet TAKE ONE TABLET BY MOUTH DAILY  . [DISCONTINUED] losartan-hydrochlorothiazide (HYZAAR) 100-25 MG tablet TAKE ONE TABLET BY MOUTH DAILY   No facility-administered encounter medications on file as of 05/20/2017.     Allergies  Allergen Reactions  . Percocet [Oxycodone-Acetaminophen] Other (See Comments)    Upset stomach    ROS: The patient denies anorexia, fever, weight changes, headaches, vision changes, decreased hearing, ear pain, sore throat, breast concerns, chest pain, palpitations, dizziness, syncope, dyspnea on exertion, swelling, nausea, vomiting, diarrhea, constipation, abdominal pain, melena, hematochezia, indigestion/heartburn, hematuria, vaginal bleeding, genital lesions, joint pains, numbness, tingling, weakness, tremor, suspicious skin lesions, depression, anxiety, abnormal bleeding/bruising, or enlarged lymph nodes.  She denies any urinary complaints, just rare urge incontinence. Allergies are well controlled. Slight dry cough at night when laying down, when she first lays down at night (postnasal drainage).  Neck pain resolved since sleeping with cervical pillow. Itchy ears sometime, worse when allergies flaring in summer/spring. Only rarely has discomfort in the right knee. +stress/worries, but thinks she is handling things okay.   PHYSICAL EXAM:  BP 124/74   Pulse 68   Ht 5' 5" (1.651 m)   Wt 139 lb 6.4 oz (63.2 kg)   BMI 23.20 kg/m   Wt Readings from Last 3 Encounters:  05/20/17 139 lb 6.4 oz (63.2 kg)  03/28/17 138 lb 12.8 oz (63 kg)  03/22/17 135 lb (61.2 kg)    General Appearance:  Alert, cooperative, no distress, appears younger than stated age   Head:  Normocephalic, atraumatic  Eyes:  PERRL, conjunctiva/corneas clear,  EOM's intact, fundi benign   Ears:  Normal TM's and external ear canals   Nose:  Nares normal, mucosa is mildly edematous with clear mucus on the right; no purulence or erythema, sinuses nontender   Throat:  Lips, mucosa, and tongue normal; teeth and gums normal   Neck:  Supple, no lymphadenopathy; thyroid: no enlargement/ tenderness/nodules; no carotid bruit or JVD   Back:  Spine nontender, no curvature, ROM normal, no CVA tenderness.   Lungs:  Clear to auscultation bilaterally without wheezes, rales or ronchi; respirations unlabored   Chest Wall:  No tenderness or deformity   Heart:  Regular rate and rhythm, S1 and S2 normal, no murmur, rub or gallop   Breast Exam:  No tenderness, masses, or nipple discharge or inversion. No axillary lymphadenopathy. WHSS R breast at 9 o'clock.There is also a WHSS in the right axilla. Left breast larger than right. Many SK's and some skin tags.  Abdomen:  Soft, non-tender, nondistended, normoactive bowel sounds,  no masses, no hepatosplenomegaly   Genitalia:  Normal external genitalia without lesions, +atrophic changes. BUS and vagina normal; Adnexa not palpable. Uterus surgically absent. No masses or tenderness. Pap not performed   Rectal:  Normal tone, no masses or tenderness; guaiac negative stool   Extremities:  No clubbing, cyanosis or edema. WHSS on R foot. R great toe overlaps the 2nd some; callouses noted  Pulses:  2+ and symmetric all extremities   Skin:  Skin color, texture, turgor normal, no rashes or lesions. Many seborrheic keratosis throughout, especially on chest/abdomen  Lymph nodes:  Cervical, supraclavicular, and axillary nodes normal   Neurologic:  CNII-XII intact, normal strength, sensation and gait; reflexes 2+ and symmetric throughout    Psych: Normal mood, affect, hygiene and grooming.   ASSESSMENT/PLAN:  Annual physical exam - Plan: POCT  Urinalysis DIP (Proadvantage Device)  Medicare annual wellness visit, subsequent  Mixed hyperlipidemia - Plan: Comprehensive metabolic panel, Lipid panel  Essential hypertension, benign - well controlled - Plan: Comprehensive metabolic panel  Hypothyroidism, unspecified type  Medication monitoring encounter - Plan: CBC with Differential/Platelet, Comprehensive metabolic panel, Lipid panel    Discussed monthly self breast exams and yearly mammograms; at least 30 minutes of aerobic activity at least 5 days/week, weight-bearing exercise 2/week; proper sunscreen use reviewed; healthy diet, including goals of calcium and vitamin D intake and alcohol recommendations (less than or equal to 1 drink/day) reviewed; regular seatbelt use; changing batteries in smoke detectors. Immunization recommendations discussed--UTD, continue yearly high dose flu shots. Colonoscopy UTD, due again June, 2019  Counseled re: stress reduction, exercise (cardio and weight-bearing).  Encouraged to fill out Living Will and Healthcare POA forms and to give [place] completed copies. She is Full Code, Full Care. MOST form reviewed/signed   Medicare Attestation I have personally reviewed: The patient's medical and social history Their use of alcohol, tobacco or illicit drugs Their current medications and supplements The patient's functional ability including ADLs,fall risks, home safety risks, cognitive, and hearing and visual impairment Diet and physical activities Evidence for depression or mood disorders  The patient's weight, height and BMI have been recorded in the chart.  I have made referrals, counseling, and provided education to the patient based on review of the above and I have provided the patient with a written personalized care plan for preventive services.

## 2017-05-20 ENCOUNTER — Encounter: Payer: Self-pay | Admitting: Family Medicine

## 2017-05-20 ENCOUNTER — Ambulatory Visit (INDEPENDENT_AMBULATORY_CARE_PROVIDER_SITE_OTHER): Payer: Medicare Other | Admitting: Family Medicine

## 2017-05-20 VITALS — BP 124/74 | HR 68 | Ht 65.0 in | Wt 139.4 lb

## 2017-05-20 DIAGNOSIS — I10 Essential (primary) hypertension: Secondary | ICD-10-CM | POA: Diagnosis not present

## 2017-05-20 DIAGNOSIS — Z5181 Encounter for therapeutic drug level monitoring: Secondary | ICD-10-CM

## 2017-05-20 DIAGNOSIS — E782 Mixed hyperlipidemia: Secondary | ICD-10-CM | POA: Diagnosis not present

## 2017-05-20 DIAGNOSIS — Z0000 Encounter for general adult medical examination without abnormal findings: Secondary | ICD-10-CM

## 2017-05-20 DIAGNOSIS — E039 Hypothyroidism, unspecified: Secondary | ICD-10-CM | POA: Diagnosis not present

## 2017-05-20 LAB — CBC WITH DIFFERENTIAL/PLATELET
BASOS: 0 %
Basophils Absolute: 0 10*3/uL (ref 0.0–0.2)
EOS (ABSOLUTE): 0.1 10*3/uL (ref 0.0–0.4)
Eos: 1 %
HEMATOCRIT: 45.5 % (ref 34.0–46.6)
Hemoglobin: 15.5 g/dL (ref 11.1–15.9)
IMMATURE GRANULOCYTES: 0 %
Immature Grans (Abs): 0 10*3/uL (ref 0.0–0.1)
LYMPHS ABS: 1.3 10*3/uL (ref 0.7–3.1)
Lymphs: 14 %
MCH: 29.5 pg (ref 26.6–33.0)
MCHC: 34.1 g/dL (ref 31.5–35.7)
MCV: 87 fL (ref 79–97)
MONOS ABS: 1.1 10*3/uL — AB (ref 0.1–0.9)
Monocytes: 12 %
Neutrophils Absolute: 6.8 10*3/uL (ref 1.4–7.0)
Neutrophils: 73 %
Platelets: 242 10*3/uL (ref 150–379)
RBC: 5.25 x10E6/uL (ref 3.77–5.28)
RDW: 13 % (ref 12.3–15.4)
WBC: 9.4 10*3/uL (ref 3.4–10.8)

## 2017-05-20 LAB — LIPID PANEL
CHOL/HDL RATIO: 2.4 ratio (ref 0.0–4.4)
Cholesterol, Total: 165 mg/dL (ref 100–199)
HDL: 68 mg/dL (ref >39)
LDL Calculated: 78 mg/dL (ref 0–99)
TRIGLYCERIDES: 97 mg/dL (ref 0–149)
VLDL Cholesterol Cal: 19 mg/dL (ref 5–40)

## 2017-05-20 LAB — COMPREHENSIVE METABOLIC PANEL
A/G RATIO: 2.3 — AB (ref 1.2–2.2)
ALBUMIN: 4.4 g/dL (ref 3.5–4.7)
ALT: 21 IU/L (ref 0–32)
AST: 26 IU/L (ref 0–40)
Alkaline Phosphatase: 85 IU/L (ref 39–117)
BUN / CREAT RATIO: 20 (ref 12–28)
BUN: 17 mg/dL (ref 8–27)
Bilirubin Total: 1.4 mg/dL — ABNORMAL HIGH (ref 0.0–1.2)
CALCIUM: 10.5 mg/dL — AB (ref 8.7–10.3)
CO2: 25 mmol/L (ref 20–29)
Chloride: 101 mmol/L (ref 96–106)
Creatinine, Ser: 0.86 mg/dL (ref 0.57–1.00)
GFR, EST AFRICAN AMERICAN: 73 mL/min/{1.73_m2} (ref >59)
GFR, EST NON AFRICAN AMERICAN: 64 mL/min/{1.73_m2} (ref >59)
GLOBULIN, TOTAL: 1.9 g/dL (ref 1.5–4.5)
Glucose: 95 mg/dL (ref 65–99)
POTASSIUM: 4.2 mmol/L (ref 3.5–5.2)
SODIUM: 145 mmol/L — AB (ref 134–144)
Total Protein: 6.3 g/dL (ref 6.0–8.5)

## 2017-05-20 LAB — POCT URINALYSIS DIP (PROADVANTAGE DEVICE)
BILIRUBIN UA: NEGATIVE
Glucose, UA: NEGATIVE mg/dL
Ketones, POC UA: NEGATIVE mg/dL
Nitrite, UA: NEGATIVE
Protein Ur, POC: NEGATIVE mg/dL
SPECIFIC GRAVITY, URINE: 1.015
Urobilinogen, Ur: NEGATIVE
pH, UA: 6 (ref 5.0–8.0)

## 2017-05-20 NOTE — Patient Instructions (Addendum)
  HEALTH MAINTENANCE RECOMMENDATIONS:  It is recommended that you get at least 30 minutes of aerobic exercise at least 5 days/week (for weight loss, you may need as much as 60-90 minutes). This can be any activity that gets your heart rate up. This can be divided in 10-15 minute intervals if needed, but try and build up your endurance at least once a week.  Weight bearing exercise is also recommended twice weekly.  Eat a healthy diet with lots of vegetables, fruits and fiber.  "Colorful" foods have a lot of vitamins (ie green vegetables, tomatoes, red peppers, etc).  Limit sweet tea, regular sodas and alcoholic beverages, all of which has a lot of calories and sugar.  Up to 1 alcoholic drink daily may be beneficial for women (unless trying to lose weight, watch sugars).  Drink a lot of water.  Calcium recommendations are 1200-1500 mg daily (1500 mg for postmenopausal women or women without ovaries), and vitamin D 1000 IU daily.  This should be obtained from diet and/or supplements (vitamins), and calcium should not be taken all at once, but in divided doses.  Monthly self breast exams and yearly mammograms for women over the age of 26 is recommended.  Sunscreen of at least SPF 30 should be used on all sun-exposed parts of the skin when outside between the hours of 10 am and 4 pm (not just when at beach or pool, but even with exercise, golf, tennis, and yard work!)  Use a sunscreen that says "broad spectrum" so it covers both UVA and UVB rays, and make sure to reapply every 1-2 hours.  Remember to change the batteries in your smoke detectors when changing your clock times in the spring and fall.  Use your seat belt every time you are in a car, and please drive safely and not be distracted with cell phones and texting while driving.   Courtney Castro , Thank you for taking time to come for your Medicare Wellness Visit. I appreciate your ongoing commitment to your health goals. Please review the  following plan we discussed and let me know if I can assist you in the future.   These are the goals we discussed: Goals    None      This is a list of the screening recommended for you and due dates:  Health Maintenance  Topic Date Due  . Tetanus Vaccine  03/23/2027  . Flu Shot  Completed  . DEXA scan (bone density measurement)  Completed  . Pneumonia vaccines  Completed   Please complete and return a copy to [place] of your Living Will and Healthcare power of attorney (needs to be notarized, doesn't require a Chief Executive Officer).  Try and get regular aerobic activity, indoors, even with inclement weather.  Only needs to be in 10-15 minute intervals.  Also weight-bearing at least 2x/week--resume your prior home regimen with light weights and bands.

## 2017-05-21 ENCOUNTER — Encounter: Payer: Self-pay | Admitting: Family Medicine

## 2017-05-29 ENCOUNTER — Other Ambulatory Visit: Payer: Self-pay | Admitting: Family Medicine

## 2017-06-24 ENCOUNTER — Other Ambulatory Visit: Payer: Self-pay | Admitting: Family Medicine

## 2017-06-24 DIAGNOSIS — J309 Allergic rhinitis, unspecified: Secondary | ICD-10-CM

## 2017-07-15 ENCOUNTER — Other Ambulatory Visit: Payer: Self-pay | Admitting: Family Medicine

## 2017-07-15 DIAGNOSIS — E782 Mixed hyperlipidemia: Secondary | ICD-10-CM

## 2017-07-23 ENCOUNTER — Other Ambulatory Visit: Payer: Self-pay | Admitting: Family Medicine

## 2017-07-23 DIAGNOSIS — I10 Essential (primary) hypertension: Secondary | ICD-10-CM

## 2017-07-23 NOTE — Telephone Encounter (Signed)
Pt has an appt in august but only gets #90 pills so I will refill this for 6 months

## 2017-07-29 ENCOUNTER — Other Ambulatory Visit: Payer: Self-pay | Admitting: Family Medicine

## 2017-07-29 DIAGNOSIS — I10 Essential (primary) hypertension: Secondary | ICD-10-CM

## 2017-08-02 DIAGNOSIS — H3530 Unspecified macular degeneration: Secondary | ICD-10-CM | POA: Diagnosis not present

## 2017-08-02 DIAGNOSIS — H11823 Conjunctivochalasis, bilateral: Secondary | ICD-10-CM | POA: Diagnosis not present

## 2017-08-02 DIAGNOSIS — H2513 Age-related nuclear cataract, bilateral: Secondary | ICD-10-CM | POA: Diagnosis not present

## 2017-10-10 ENCOUNTER — Other Ambulatory Visit: Payer: Self-pay | Admitting: Family Medicine

## 2017-10-10 DIAGNOSIS — E782 Mixed hyperlipidemia: Secondary | ICD-10-CM

## 2017-10-16 ENCOUNTER — Other Ambulatory Visit: Payer: Self-pay | Admitting: Family Medicine

## 2017-10-16 DIAGNOSIS — J309 Allergic rhinitis, unspecified: Secondary | ICD-10-CM

## 2017-10-21 ENCOUNTER — Other Ambulatory Visit: Payer: Self-pay | Admitting: Family Medicine

## 2017-10-21 DIAGNOSIS — I10 Essential (primary) hypertension: Secondary | ICD-10-CM

## 2017-10-31 DIAGNOSIS — H353112 Nonexudative age-related macular degeneration, right eye, intermediate dry stage: Secondary | ICD-10-CM | POA: Diagnosis not present

## 2017-10-31 DIAGNOSIS — D3132 Benign neoplasm of left choroid: Secondary | ICD-10-CM | POA: Diagnosis not present

## 2017-10-31 DIAGNOSIS — H35363 Drusen (degenerative) of macula, bilateral: Secondary | ICD-10-CM | POA: Diagnosis not present

## 2017-10-31 DIAGNOSIS — H2513 Age-related nuclear cataract, bilateral: Secondary | ICD-10-CM | POA: Diagnosis not present

## 2017-10-31 DIAGNOSIS — H353221 Exudative age-related macular degeneration, left eye, with active choroidal neovascularization: Secondary | ICD-10-CM | POA: Diagnosis not present

## 2017-11-19 NOTE — Progress Notes (Signed)
Chief Complaint  Patient presents with  . Hypertension    fasting med check. Worried about her husband's health and his medications. Wants to know if she should be taking baby aspirin any longer?    Reports some stressors over the last year involving her husband's health.  They overall are doing fine.  She gets upset that her 2 daughters aren't closer.  She denies any specific complaints today; asking about whether she should continue to take aspirin daily.  HTN: Blood pressure isn't checked regularly, just sporadically, recalls it being normal. Denies dizziness or headaches, chest pain, muscle cramps, edema. Compliant with medications.   Hyperlipidemia follow-up: Patient is reportedly following a low-fat, low cholesterol diet. Compliant with medications and denies medication side effects. Lipids were at goal on last check. Lab Results  Component Value Date   CHOL 165 05/20/2017   HDL 68 05/20/2017   LDLCALC 78 05/20/2017   TRIG 97 05/20/2017   CHOLHDL 2.4 05/20/2017    Hypothyroidism:She continues to notice decreased hair growth on her legs, axilla (shaving less often); denies thinning of hair. No skin changes, bowel changes, energy is good. Compliant with taking her medication, taking it on an empty stomach (30 minutes prior to eating). She separates the thyroid medication from her calcium and vitamins by at least 2 hours. Lab Results  Component Value Date   TSH 1.07 10/22/2016   Impaired fasting glucose:She limits her sweets/sugar. Has small amounts of chocolate, cookies, ice cream--small portions, makes itlast a long time. A1c's and sugars have been normal on the last checks.  Tries to get regular exercise (no longer working with trainer Mickel Baas, too hot to walk outside, walking inside). Lab Results  Component Value Date   HGBA1C 5.5 10/22/2016    History of R breast cancer, s/p lumpectomy and radiation.  She last had mammo in 01/2017, requiring biopsy, which showed fat  necrosis with dense fibrosis (benign). She had genetic testing, which revealed CHEK2 mutation-- colonoscopies recommended every 5 years, was due in June (with Dr. Watt Climes). She did not get a reminder, hasn't set anything up yet.  Allergies: year-round, and well controlled with flonase and allegra daily. She has runny nose in the morning, coughs a little at night "2 coughs only".  Macular degeneration--recent found to be wet, and got an injection in the left eye by Dr. Renford Dills. She thinks her vision improved.  Plans to start going to [employer] program.   PMH, PSH, SH reviewed  Outpatient Encounter Medications as of 11/20/2017  Medication Sig  . aspirin 81 MG tablet Take 81 mg by mouth daily.  Marland Kitchen atenolol (TENORMIN) 25 MG tablet TAKE ONE TABLET BY MOUTH DAILY  . Calcium Carbonate-Vitamin D (CALTRATE 600+D) 600-400 MG-UNIT per tablet Take 1 tablet by mouth daily.   . Cholecalciferol (VITAMIN D) 2000 UNITS tablet Take 2,000 Units by mouth daily.    . fexofenadine (ALLEGRA) 180 MG tablet Take 180 mg by mouth daily.    . fluticasone (FLONASE) 50 MCG/ACT nasal spray SPRAY TWO SPRAYS IN EACH NOSTRIL ONCE DAILY  . GLUCOSAMINE PO Take 2,000 mg by mouth 2 (two) times daily.    Marland Kitchen levothyroxine (SYNTHROID, LEVOTHROID) 25 MCG tablet TAKE ONE TABLET BY MOUTH DAILY  . losartan-hydrochlorothiazide (HYZAAR) 100-25 MG tablet TAKE ONE TABLET BY MOUTH DAILY  . Multiple Vitamins-Minerals (CENTRUM SILVER PO) Take 1 tablet by mouth daily.    . Multiple Vitamins-Minerals (PRESERVISION/LUTEIN) CAPS Take 1 capsule by mouth 2 (two) times daily.    Marland Kitchen  simvastatin (ZOCOR) 20 MG tablet TAKE ONE TABLET BY MOUTH EVERY NIGHT AT BEDTIME  . [DISCONTINUED] atenolol (TENORMIN) 25 MG tablet Take 1 tablet (25 mg total) by mouth daily.  . [DISCONTINUED] losartan-hydrochlorothiazide (HYZAAR) 100-25 MG tablet Take 1 tablet by mouth daily.   No facility-administered encounter medications on file as of 11/20/2017.     Allergies  Allergen Reactions  . Percocet [Oxycodone-Acetaminophen] Other (See Comments)    Upset stomach    ROS: no fever, chills, headaches, dizziness, URI symptoms, cough, shortness of breath. No chest pain, palpitations, nausea, vomiting, heartburn, bowel changes, skin/hair changes. Denies depression, insomnia. No bleeding, bruising, rash.  No hemorrhoidal bleeding. Mild allergy symptoms, controlled with Allegra.   PHYSICAL EXAM:  BP 128/74   Pulse 72   Ht _0  (1.651 m)   Wt 138 lb 3.2 oz (62.7 kg)   BMI 23.00 kg/m    Wt Readings from Last 3 Encounters:  11/20/17 138 lb 3.2 oz (62.7 kg)  05/20/17 139 lb 6.4 oz (63.2 kg)  03/28/17 138 lb 12.8 oz (63 kg)    Well developed, talkative female in no distress HEENT: PERRL, EOMI, conjunctiva clear.OP clear. Neck: no lymphadenopathy, thyromegaly or carotid bruit. Heart: regular rate and rhythm, no murmur Lungs: clear bilaterally Abdomen: soft, nontender, no organomegaly or mass Back: no CVA or spinal tenderness Extremities: No edema, 2+ pulses Skin: normal turgor, no rashes, lesions Psych: normal mood, affect, hygiene and grooming. Neuro: alert and oriented. Normal cranial nerves, gait.   ASSESSMENT/PLAN:  Essential hypertension, benign - well controlled, continue current regimen . Contact pharmacy when refills are needed  - Plan: Comprehensive metabolic panel  Hypothyroidism, unspecified type - euthyroid by history, due for TSH check - Plan: TSH  Mixed hyperlipidemia - lipids at goal per last check; continue statin, lowfat, low cholesterol diet - Plan: Comprehensive metabolic panel  Medication monitoring encounter - Plan: Comprehensive metabolic panel, TSH  Impaired fasting glucose - has been normal recently. Encouraged continued healthy diet, regular exercise  Colon cancer screening - due for colonoscopy; needed q5 years due to CHEK2 mutation. Pt to contact Dr. Watt Climes to schedule   c-met, TSH  All  questions answered. Discussed risks/benefits of aspirin. She has no h/o CVA, CAD, no studies showing atherosclerosis (? Poss on head CT??). Not recommended for primary prevention, okay to stop.  Due for colonoscopy (never got 2014 report from Dr. Mickel Baas to schedule and ensure that we get copies of report.  Counseled re: daughters relationships, husband's health, staying active. [employer] program is fine for her to try. Discussed overall exercise recommendations (150 min cardio/wk, weight-bearing exercise at least 2-3/wk).  F/u 6 mos CPE/AWV, as scheduled.

## 2017-11-20 ENCOUNTER — Encounter: Payer: Self-pay | Admitting: Family Medicine

## 2017-11-20 ENCOUNTER — Ambulatory Visit (INDEPENDENT_AMBULATORY_CARE_PROVIDER_SITE_OTHER): Payer: Medicare Other | Admitting: Family Medicine

## 2017-11-20 VITALS — BP 128/74 | HR 72 | Ht 65.0 in | Wt 138.2 lb

## 2017-11-20 DIAGNOSIS — Z5181 Encounter for therapeutic drug level monitoring: Secondary | ICD-10-CM | POA: Diagnosis not present

## 2017-11-20 DIAGNOSIS — E039 Hypothyroidism, unspecified: Secondary | ICD-10-CM

## 2017-11-20 DIAGNOSIS — Z1211 Encounter for screening for malignant neoplasm of colon: Secondary | ICD-10-CM

## 2017-11-20 DIAGNOSIS — I10 Essential (primary) hypertension: Secondary | ICD-10-CM | POA: Diagnosis not present

## 2017-11-20 DIAGNOSIS — R7301 Impaired fasting glucose: Secondary | ICD-10-CM

## 2017-11-20 DIAGNOSIS — E782 Mixed hyperlipidemia: Secondary | ICD-10-CM | POA: Diagnosis not present

## 2017-11-20 NOTE — Patient Instructions (Addendum)
I don't have records from Dr. Watt Climes, but per previous discussions, I believe you last saw him for colonoscopy in 2014, and were due for a 5 year colonoscopy (due to the CHEK2 mutation) this year.  Please call Dr. Perley Jain office to set this up. And please make sure he sends me the reports.  I don't believe you need to take aspirin daily (recommendations keep changing, I believe current recommendations are that you do NOT need it, based on not having any significant atherosclerosis or known heart/carotid/peripheral vascular disease.

## 2017-11-21 ENCOUNTER — Encounter: Payer: Self-pay | Admitting: Family Medicine

## 2017-11-21 LAB — COMPREHENSIVE METABOLIC PANEL
A/G RATIO: 2.8 — AB (ref 1.2–2.2)
ALBUMIN: 4.7 g/dL (ref 3.5–4.7)
ALT: 20 IU/L (ref 0–32)
AST: 24 IU/L (ref 0–40)
Alkaline Phosphatase: 93 IU/L (ref 39–117)
BUN / CREAT RATIO: 20 (ref 12–28)
BUN: 16 mg/dL (ref 8–27)
Bilirubin Total: 1.6 mg/dL — ABNORMAL HIGH (ref 0.0–1.2)
CALCIUM: 10.3 mg/dL (ref 8.7–10.3)
CO2: 27 mmol/L (ref 20–29)
CREATININE: 0.8 mg/dL (ref 0.57–1.00)
Chloride: 98 mmol/L (ref 96–106)
GFR, EST AFRICAN AMERICAN: 79 mL/min/{1.73_m2} (ref >59)
GFR, EST NON AFRICAN AMERICAN: 69 mL/min/{1.73_m2} (ref >59)
GLOBULIN, TOTAL: 1.7 g/dL (ref 1.5–4.5)
Glucose: 94 mg/dL (ref 65–99)
POTASSIUM: 4.1 mmol/L (ref 3.5–5.2)
SODIUM: 140 mmol/L (ref 134–144)
TOTAL PROTEIN: 6.4 g/dL (ref 6.0–8.5)

## 2017-11-21 LAB — TSH: TSH: 1.4 u[IU]/mL (ref 0.450–4.500)

## 2017-11-21 MED ORDER — LEVOTHYROXINE SODIUM 25 MCG PO TABS
25.0000 ug | ORAL_TABLET | Freq: Every day | ORAL | 3 refills | Status: DC
Start: 2017-11-21 — End: 2018-11-21

## 2017-12-05 DIAGNOSIS — H353221 Exudative age-related macular degeneration, left eye, with active choroidal neovascularization: Secondary | ICD-10-CM | POA: Diagnosis not present

## 2017-12-05 DIAGNOSIS — D3132 Benign neoplasm of left choroid: Secondary | ICD-10-CM | POA: Diagnosis not present

## 2017-12-05 DIAGNOSIS — H353112 Nonexudative age-related macular degeneration, right eye, intermediate dry stage: Secondary | ICD-10-CM | POA: Diagnosis not present

## 2017-12-16 ENCOUNTER — Encounter: Payer: Self-pay | Admitting: *Deleted

## 2018-01-02 ENCOUNTER — Telehealth: Payer: Self-pay | Admitting: *Deleted

## 2018-01-02 NOTE — Telephone Encounter (Signed)
error 

## 2018-01-08 ENCOUNTER — Other Ambulatory Visit: Payer: Self-pay | Admitting: Family Medicine

## 2018-01-08 DIAGNOSIS — E782 Mixed hyperlipidemia: Secondary | ICD-10-CM

## 2018-01-09 DIAGNOSIS — H353112 Nonexudative age-related macular degeneration, right eye, intermediate dry stage: Secondary | ICD-10-CM | POA: Diagnosis not present

## 2018-01-09 DIAGNOSIS — H35363 Drusen (degenerative) of macula, bilateral: Secondary | ICD-10-CM | POA: Diagnosis not present

## 2018-01-09 DIAGNOSIS — H353221 Exudative age-related macular degeneration, left eye, with active choroidal neovascularization: Secondary | ICD-10-CM | POA: Diagnosis not present

## 2018-01-20 ENCOUNTER — Other Ambulatory Visit: Payer: Self-pay | Admitting: Family Medicine

## 2018-01-20 DIAGNOSIS — I10 Essential (primary) hypertension: Secondary | ICD-10-CM

## 2018-01-28 DIAGNOSIS — C44712 Basal cell carcinoma of skin of right lower limb, including hip: Secondary | ICD-10-CM | POA: Diagnosis not present

## 2018-01-28 DIAGNOSIS — Z85828 Personal history of other malignant neoplasm of skin: Secondary | ICD-10-CM | POA: Diagnosis not present

## 2018-01-28 DIAGNOSIS — L820 Inflamed seborrheic keratosis: Secondary | ICD-10-CM | POA: Diagnosis not present

## 2018-01-28 DIAGNOSIS — D692 Other nonthrombocytopenic purpura: Secondary | ICD-10-CM | POA: Diagnosis not present

## 2018-01-28 DIAGNOSIS — I8391 Asymptomatic varicose veins of right lower extremity: Secondary | ICD-10-CM | POA: Diagnosis not present

## 2018-01-28 DIAGNOSIS — D3617 Benign neoplasm of peripheral nerves and autonomic nervous system of trunk, unspecified: Secondary | ICD-10-CM | POA: Diagnosis not present

## 2018-01-28 DIAGNOSIS — L570 Actinic keratosis: Secondary | ICD-10-CM | POA: Diagnosis not present

## 2018-01-28 HISTORY — DX: Basal cell carcinoma of skin of right lower limb, including hip: C44.712

## 2018-01-29 ENCOUNTER — Other Ambulatory Visit: Payer: Self-pay | Admitting: Family Medicine

## 2018-01-29 DIAGNOSIS — Z1231 Encounter for screening mammogram for malignant neoplasm of breast: Secondary | ICD-10-CM

## 2018-02-03 ENCOUNTER — Encounter: Payer: Self-pay | Admitting: *Deleted

## 2018-02-05 ENCOUNTER — Encounter: Payer: Self-pay | Admitting: Family Medicine

## 2018-02-27 DIAGNOSIS — H353112 Nonexudative age-related macular degeneration, right eye, intermediate dry stage: Secondary | ICD-10-CM | POA: Diagnosis not present

## 2018-02-27 DIAGNOSIS — H353221 Exudative age-related macular degeneration, left eye, with active choroidal neovascularization: Secondary | ICD-10-CM | POA: Diagnosis not present

## 2018-03-13 ENCOUNTER — Ambulatory Visit
Admission: RE | Admit: 2018-03-13 | Discharge: 2018-03-13 | Disposition: A | Discharge status: Home / Self Care | Payer: Medicare Other | Source: Ambulatory Visit | Attending: Family Medicine | Admitting: Family Medicine

## 2018-03-13 DIAGNOSIS — Z1231 Encounter for screening mammogram for malignant neoplasm of breast: Secondary | ICD-10-CM

## 2018-04-02 ENCOUNTER — Other Ambulatory Visit: Payer: Self-pay | Admitting: Family Medicine

## 2018-04-02 DIAGNOSIS — J309 Allergic rhinitis, unspecified: Secondary | ICD-10-CM

## 2018-04-03 DIAGNOSIS — H353112 Nonexudative age-related macular degeneration, right eye, intermediate dry stage: Secondary | ICD-10-CM | POA: Diagnosis not present

## 2018-04-03 DIAGNOSIS — H353221 Exudative age-related macular degeneration, left eye, with active choroidal neovascularization: Secondary | ICD-10-CM | POA: Diagnosis not present

## 2018-04-16 ENCOUNTER — Other Ambulatory Visit: Payer: Self-pay | Admitting: Family Medicine

## 2018-04-16 DIAGNOSIS — I10 Essential (primary) hypertension: Secondary | ICD-10-CM

## 2018-04-23 ENCOUNTER — Other Ambulatory Visit: Payer: Self-pay | Admitting: Family Medicine

## 2018-04-23 DIAGNOSIS — I10 Essential (primary) hypertension: Secondary | ICD-10-CM

## 2018-04-30 DIAGNOSIS — H11823 Conjunctivochalasis, bilateral: Secondary | ICD-10-CM | POA: Diagnosis not present

## 2018-04-30 DIAGNOSIS — H3530 Unspecified macular degeneration: Secondary | ICD-10-CM | POA: Diagnosis not present

## 2018-04-30 DIAGNOSIS — H2513 Age-related nuclear cataract, bilateral: Secondary | ICD-10-CM | POA: Diagnosis not present

## 2018-05-14 DIAGNOSIS — H25813 Combined forms of age-related cataract, bilateral: Secondary | ICD-10-CM | POA: Diagnosis not present

## 2018-05-14 DIAGNOSIS — H353221 Exudative age-related macular degeneration, left eye, with active choroidal neovascularization: Secondary | ICD-10-CM | POA: Diagnosis not present

## 2018-05-14 DIAGNOSIS — H353112 Nonexudative age-related macular degeneration, right eye, intermediate dry stage: Secondary | ICD-10-CM | POA: Diagnosis not present

## 2018-05-14 DIAGNOSIS — H52201 Unspecified astigmatism, right eye: Secondary | ICD-10-CM | POA: Diagnosis not present

## 2018-05-21 NOTE — Progress Notes (Signed)
Chief Complaint  Patient presents with  . Medicare Wellness    fasting AWV/CPE with pelvic. Sees eye doctor regularly. Having cataract surgery (R) 06/10/2018 with Dr. Kathrin Penner. Only concern is her and Barnabas Lister and her patience.     Courtney Castro is a 85 y.o. female who presents for annual physical exam, Medicare Wellness visit and follow-up on chronic medical conditions.  Her main concern remains her husband's health.    HTN: Blood pressure hasn't been checked in a while.Denies dizziness or headaches, chest pain, muscle cramps, edema. Compliant with medications.   Hyperlipidemia follow-up: Patient is reportedly following a low-fat, low cholesterol diet. Compliant with medications and denies medication side effects.Lipids were at goal on last check. Due for recheck. Lab Results  Component Value Date   CHOL 165 05/20/2017   HDL 68 05/20/2017   LDLCALC 78 05/20/2017   TRIG 97 05/20/2017   CHOLHDL 2.4 05/20/2017   Hypothyroidism:She continues to notice decreasedhair growth on her legs, axilla (shaving less often); denies thinning of hair. No skin changes, bowel changes, energy is good. Compliant with taking her medication, taking it on an empty stomach (30 minutes prior to eating). She separates the thyroid medication from her calcium and vitamins by at least 2 hours. Lab Results  Component Value Date   TSH 1.400 11/20/2017   H/o Impaired fasting glucose:Shelimits her sweets/sugar. Has small amounts of chocolate, cookies, ice cream--small portions, makes itlast a long time.Yesterday was her birthday and she dessert twice. A1c's and sugars have been normal on the last checks.  Tries to get regular exercise  Lab Results  Component Value Date   HGBA1C 5.5 10/22/2016   History of R breast cancer, s/p lumpectomy and radiation. She last had mammo in 02/2018. Biopsy in 2018 showed fat necrosis with dense fibrosis (benign). She had genetic testing, which revealed CHEK2 mutation--  colonoscopies recommended every 5 years.  She was reminded in August that she was due to see Dr. Watt Climes, but she hasn't gotten around to that yet.  Allergies: year-round, and well controlled with flonase and allegra daily. She has runny nose in the morning, coughs a little at night while reading in bed at night.  Macular degeneration--continues to get injections in the left eye by Dr. Renford Dills.vision is stable/improved.  At some point plans to have R cataract removed.   Immunization History  Administered Date(s) Administered  . Influenza Split 12/11/2009, 01/02/2011, 12/21/2011  . Influenza, High Dose Seasonal PF 12/05/2012, 12/17/2013, 12/13/2014, 12/22/2015, 01/01/2017, 12/04/2017  . Pneumococcal Conjugate-13 03/04/2014  . Pneumococcal Polysaccharide-23 11/07/2001, 01/31/2011  . Td 12/18/2004  . Tdap 01/31/2011, 03/22/2017  . Zoster 08/25/2007  . Zoster Recombinat (Shingrix) 07/10/2016, 10/18/2016   Last Pap smear: 2011, s/p hysterectomy for benign reasons  Last mammogram: 02/2018 Last colonoscopy: June 2014 with Dr. Watt Climes. Normal per pt, repeat in 5 years Last DEXA: 05/2016 normal Dentist: twice yearly  Ophtho: yearly; sees Dr. Cordelia Pen more often for injections Exercise:Walks outside when she can.  Not getting regular exercise.   Other doctors caring for patient include: Ophtho: Dr. Susa Simmonds, Dr. Cordelia Pen (retinal specialist), at [facility]. Plans to see Dr. Kathrin Penner for her cataract surgery Dentist: Dr. Candelaria Celeste seeing the woman who bought his practice, can't recall the name GI: Dr. Watt Climes Derm: Dr. Derrel Nip Podiatry: Dr. Fritzi Mandes (no longer sees) Ortho: Dr. Wynelle Link, Dr. Rhona Raider Neurosurg: Dr. Weston Settle, Dr. Vertell Limber   Depression screen: negative Fall screen-- none Functional Status survey notable for decreased vision R eye (cataract); slight memory issue, very  minor Mini-Cog screen normal See full screens in epic  End of Life Discussion: Patient hasnot yet filled  out living will and healthcare power of attorney forms that were given in the pastt. She still has them at home and plans to do, hasn't set up appointment with attorney yet. Doesn't know a notary.  Past Medical History:  Diagnosis Date  . Allergy    seasonal allergies  . Basal cell carcinoma 2007, 2015, 2017   face and shoulder (Dr. Derrel Nip)   2017-chin  . BCC (basal cell carcinoma), leg, right 01/28/2018   right thigh-Dr Fontaine No  . Breast cancer ([place]) 1989   R breast-microinvasive papillary(lumpectomy and XRT)  . Colon polyp 09/2000  . Diverticulosis    seen on colonoscopy  . Elevated liver function tests 06/2009   galllstones and dilated CBD--s/p ERCP and chole  . Family history of breast cancer   . Genetic testing 12/20/2016   Multi-Cancer panel (83 genes) @ [facility] - CHEK2 mutation  . Gilbert syndrome   . Hyperlipidemia   . Hypertension   . Hyperthyroidism   . IBS (irritable bowel syndrome) 1984  . Macular degeneration 2008   mild  . Radial head fracture 02/2014   left, nondisplaced  . Subarachnoid hemorrhage ([place]) 02/2014   after fall  . Subdural hematoma ([place]) 06/2008   Dr.Cram  . Subdural hematoma ([place]) 02/2014   after fall (Dr. Vertell Limber)    Past Surgical History:  Procedure Laterality Date  . ABDOMINAL HYSTERECTOMY     bladder repair  . BREAST LUMPECTOMY Right 1989   right breast  . ERCP  07/2009   sphincterotomy  . fracture left foot  01/2007   5th metatarsal-treated with boot  . HAMMER TOE SURGERY Right 05/2004   right foot  . LAPAROSCOPIC CHOLECYSTECTOMY  07/2009  . TOTAL KNEE ARTHROPLASTY Right 2000  . TOTAL KNEE ARTHROPLASTY Left 2004   Dr. Wynelle Link  . TUBAL LIGATION      Social History   Socioeconomic History  . Marital status: Married    Spouse name: Not on file  . Number of children: 2  . Years of education: Not on file  . Highest education level: Not on file  Occupational History    Employer: RETIRED  Social Needs  . Financial  resource strain: Not on file  . Food insecurity:    Worry: Not on file    Inability: Not on file  . Transportation needs:    Medical: Not on file    Non-medical: Not on file  Tobacco Use  . Smoking status: Former Smoker    Packs/day: 0.10    Years: 3.00    Pack years: 0.30    Last attempt to quit: 03/27/1963    Years since quitting: 55.1  . Smokeless tobacco: Never Used  Substance and Sexual Activity  . Alcohol use: Yes    Alcohol/week: 0.0 standard drinks    Comment: 2-3 glasses of wine/month  . Drug use: No  . Sexual activity: Yes    Partners: Male  Lifestyle  . Physical activity:    Days per week: Not on file    Minutes per session: Not on file  . Stress: Not on file  Relationships  . Social connections:    Talks on phone: Not on file    Gets together: Not on file    Attends religious service: Not on file    Active member of club or organization: Not on file    Attends meetings of  clubs or organizations: Not on file    Relationship status: Not on file  . Intimate partner violence:    Fear of current or ex partner: Not on file    Emotionally abused: Not on file    Physically abused: Not on file    Forced sexual activity: Not on file  Other Topics Concern  . Not on file  Social History Narrative   Married.   Retired Statistician.  1 daughter in [place] ([name]), and 1 daughter in [place]). 4 granddaughters    Family History  Problem Relation Age of Onset  . Leukemia Mother        deceased 24; TAH/BSO in 36s  . Coronary artery disease Father   . Hypertension Father   . Heart disease Father        onset late 24's  . Coronary artery disease Brother 60       coronary artery bypass grafting, pacemaker  . Heart disease Brother        CABG in 60's, pacemaker  . Diabetes Paternal Uncle   . Diabetes Paternal Grandmother   . Breast cancer Daughter 4  . Fibroids Daughter   . Breast cancer Maternal Aunt 40       deceased 58s  . Breast cancer Cousin 74        daughter of mat aunt with BC at 46; currently 42  . Cancer Maternal Uncle        dx 60s; unk. type  . Breast cancer Maternal Aunt        dx 87s; deceased 50s    Outpatient Encounter Medications as of 05/22/2018  Medication Sig  . atenolol (TENORMIN) 25 MG tablet TAKE ONE TABLET BY MOUTH DAILY  . Calcium Carbonate-Vitamin D (CALTRATE 600+D) 600-400 MG-UNIT per tablet Take 1 tablet by mouth daily.   . Cholecalciferol (VITAMIN D) 2000 UNITS tablet Take 2,000 Units by mouth daily.    . fexofenadine (ALLEGRA) 180 MG tablet Take 180 mg by mouth daily.    . fluticasone (FLONASE) 50 MCG/ACT nasal spray SPRAY TWO SPRAYS INTO EACH NOSTRIL ONCE DAILY  . GLUCOSAMINE PO Take 2,000 mg by mouth 2 (two) times daily.    Marland Kitchen levothyroxine (SYNTHROID, LEVOTHROID) 25 MCG tablet Take 1 tablet (25 mcg total) by mouth daily.  Marland Kitchen losartan-hydrochlorothiazide (HYZAAR) 100-25 MG tablet TAKE ONE TABLET BY MOUTH DAILY  . Multiple Vitamins-Minerals (CENTRUM SILVER PO) Take 1 tablet by mouth daily.    . Multiple Vitamins-Minerals (PRESERVISION/LUTEIN) CAPS Take 1 capsule by mouth 2 (two) times daily.    . simvastatin (ZOCOR) 20 MG tablet TAKE ONE TABLET BY MOUTH EVERY NIGHT AT BEDTIME  . [DISCONTINUED] aspirin 81 MG tablet Take 81 mg by mouth daily.   No facility-administered encounter medications on file as of 05/22/2018.     Allergies  Allergen Reactions  . Percocet [Oxycodone-Acetaminophen] Other (See Comments)    Upset stomach    ROS: The patient denies anorexia, fever, weight changes, headaches, vision changes (improved on the left, with injections; remains decreased on the right from cataract), decreased hearing, ear pain, sore throat, breast concerns, chest pain, palpitations, dizziness, syncope, dyspnea on exertion, swelling, nausea, vomiting, diarrhea, constipation, abdominal pain, melena, hematochezia, indigestion/heartburn, hematuria, vaginal bleeding, genital lesions, joint pains, numbness, tingling,  weakness, tremor, suspicious skin lesions, depression, anxiety, abnormal bleeding/bruising, or enlarged lymph nodes.  She denies any urinary complaints, just rare urge incontinence. Allergies are controlled, slight dry cough at night when she first lays down (  from PND) No longer having any neck or knee pain. Denies depression, is a Research officer, trade union.   PHYSICAL EXAM:  BP 140/86   Pulse 60   Ht 5' 4" (1.626 m)   Wt 136 lb 12.8 oz (62.1 kg)   BMI 23.48 kg/m    BP 104/62 on repeat by MD  Wt Readings from Last 3 Encounters:  05/22/18 136 lb 12.8 oz (62.1 kg)  11/20/17 138 lb 3.2 oz (62.7 kg)  05/20/17 139 lb 6.4 oz (63.2 kg)    General Appearance:  Alert, cooperative, no distress, appearsyounger thanstated age   Head:  Normocephalic, atraumatic  Eyes:  PERRL, conjunctiva/corneas clear, EOM's intact, fundi not well visualized  Ears:  Normal TM's and external ear canals   Nose:  Nares normal, mucosa is moderately edematous with clear-white mucus noted bilaterally;no purulence or erythema, sinuses nontender   Throat:  Lips, mucosa, and tongue normal; teeth and gums normal   Neck:  Supple, no lymphadenopathy; thyroid: no enlargement/ tenderness/nodules; no carotid bruit or JVD   Back:  Spine nontender, no curvature, ROM normal, no CVAtenderness.   Lungs:  Clear to auscultation bilaterally without wheezes, rales or ronchi; respirations unlabored   Chest Wall:  No tenderness or deformity   Heart:  Regular rate and rhythm, S1 and S2 normal, no murmur, rub or gallop   Breast Exam:  No tenderness, masses, or nipple discharge or inversion. No axillary lymphadenopathy. WHSS R breast at 9 o'clock, firm along scar, nontender.There is also a WHSS in the right axilla. Left breast larger than right.Many SK's and some skin tags.  Abdomen:  Soft, non-tender, nondistended, normoactive bowel sounds,  no masses, no hepatosplenomegaly   Genitalia:  Normal  external genitalia without lesions, +atrophic changes. BUS and vagina normal; Adnexa not palpable. Uterus surgically absent. No masses or tenderness. Pap not performed   Rectal:  Normal tone, no masses or tenderness; guaiac negative stool   Extremities:  No clubbing, cyanosis or edema. WHSS on R foot, bilateral knees. R great toe overlaps the 2nd some, slight hammertoe L 2nd. Some callouses noted (tips of toes mainly)  Pulses:  2+ and symmetric all extremities   Skin:  Skin color, texture, turgor normal, no rashes or lesions. Many seborrheic keratosis throughout, especially on chest/abdomen. Some bruising noted on hands.  Lymph nodes:  Cervical, supraclavicular, and axillary nodes normal   Neurologic:  CNII-XII intact, normal strength, sensation and gait; reflexes 2+ and symmetric throughout    Psych: Normal mood, affect, hygiene and grooming.   ASSESSMENT/PLAN:  Annual physical exam - Plan: Comprehensive metabolic panel, Lipid panel, CBC with Differential/Platelet  Medicare annual wellness visit, subsequent  Essential hypertension, benign - high initially, great on repeat. cont current meds, periodically monitor at home - Plan: Comprehensive metabolic panel  Hypothyroidism, unspecified type - euthyroid clinically, last TSH normal. Check yearly, next due August  Mixed hyperlipidemia - due for recheck; cont statin and lowfat, low cholesterol diet - Plan: Lipid panel  Medication monitoring encounter - Plan: Comprehensive metabolic panel, Lipid panel, CBC with Differential/Platelet  Senile purpura (HCC)  Colon cancer screening - recommended q5 yrs du to CHEK2 mutation. Was due 08/2017; reminded to contact Dr. Perley Jain office to discuss  Allergic rhinitis, unspecified seasonality, unspecified trigger - continue current regimen  History of breast cancer - mammo UTD, continue yearly. stable exam   c-met, CBC, lipids  No refills needed  today   Discussed monthly self breast exams and yearly mammograms; at least 30 minutes of aerobic  activity at least 5 days/week, weight-bearing exercise 2/week; proper sunscreen use reviewed; healthy diet, including goals of calcium and vitamin D intake and alcohol recommendations (less than or equal to 1 drink/day) reviewed; regular seatbelt use; changing batteries in smoke detectors. Immunization recommendations discussed--UTD, continue yearly high dose flu shots.Colonoscopy was due June, 2019, advised to contact Dr. Watt Climes  Encouraged to fill outLiving Will and Healthcare POA formsand to give [place] completed copies. Reminded these only need notary  She is Full Code, Full Care. MOST form reviewed/signed  F/u 6 mos med check  Medicare Attestation I have personally reviewed: The patient's medical and social history Their use of alcohol, tobacco or illicit drugs Their current medications and supplements The patient's functional ability including ADLs,fall risks, home safety risks, cognitive, and hearing and visual impairment Diet and physical activities Evidence for depression or mood disorders  The patient's weight, height and BMI have been recorded in the chart.  I have made referrals, counseling, and provided education to the patient based on review of the above and I have provided the patient with a written personalized care plan for preventive services.

## 2018-05-21 NOTE — Patient Instructions (Addendum)
  HEALTH MAINTENANCE RECOMMENDATIONS:  It is recommended that you get at least 30 minutes of aerobic exercise at least 5 days/week (for weight loss, you may need as much as 60-90 minutes). This can be any activity that gets your heart rate up. This can be divided in 10-15 minute intervals if needed, but try and build up your endurance at least once a week.  Weight bearing exercise is also recommended twice weekly.  Eat a healthy diet with lots of vegetables, fruits and fiber.  "Colorful" foods have a lot of vitamins (ie green vegetables, tomatoes, red peppers, etc).  Limit sweet tea, regular sodas and alcoholic beverages, all of which has a lot of calories and sugar.  Up to 1 alcoholic drink daily may be beneficial for women (unless trying to lose weight, watch sugars).  Drink a lot of water.  Calcium recommendations are 1200-1500 mg daily (1500 mg for postmenopausal women or women without ovaries), and vitamin D 1000 IU daily.  This should be obtained from diet and/or supplements (vitamins), and calcium should not be taken all at once, but in divided doses.  Monthly self breast exams and yearly mammograms for women over the age of 47 is recommended.  Sunscreen of at least SPF 30 should be used on all sun-exposed parts of the skin when outside between the hours of 10 am and 4 pm (not just when at beach or pool, but even with exercise, golf, tennis, and yard work!)  Use a sunscreen that says "broad spectrum" so it covers both UVA and UVB rays, and make sure to reapply every 1-2 hours.  Remember to change the batteries in your smoke detectors when changing your clock times in the spring and fall.  Use your seat belt every time you are in a car, and please drive safely and not be distracted with cell phones and texting while driving.   Courtney Castro , Thank you for taking time to come for your Medicare Wellness Visit. I appreciate your ongoing commitment to your health goals. Please review the  following plan we discussed and let me know if I can assist you in the future.    This is a list of the screening recommended for you and due dates:  Health Maintenance  Topic Date Due  . Tetanus Vaccine  03/23/2027  . Flu Shot  Completed  . DEXA scan (bone density measurement)  Completed  . Pneumonia vaccines  Completed   Continue yearly high dose flu shots. Continue yearly 3D mammograms (next due 02/2019)  You are past due for colonoscopy--recommended every 5 years due to CHEK2 mutation.  You should discuss this with Dr. Watt Climes to decide if this would be appropriate. Please call Dr. Perley Jain office to schedule an appointment to discuss your options.  Please fill out the paperwork for living will and healthcare power of attorney. It does NOT require an attorney, just to be notarized.   Periodically check your blood pressure elsewhere--if it is high at first, wait 5-10 minutes and repeat it. If it is consistantly over 135-140/85-90, please return here and bring your list of blood pressures with you.  You don't need to check it every day, a few times/month is fine, just to make sure that it is okay.

## 2018-05-22 ENCOUNTER — Encounter: Payer: Self-pay | Admitting: Family Medicine

## 2018-05-22 ENCOUNTER — Ambulatory Visit (INDEPENDENT_AMBULATORY_CARE_PROVIDER_SITE_OTHER): Payer: Medicare Other | Admitting: Family Medicine

## 2018-05-22 VITALS — BP 104/62 | HR 60 | Ht 64.0 in | Wt 136.8 lb

## 2018-05-22 DIAGNOSIS — I10 Essential (primary) hypertension: Secondary | ICD-10-CM

## 2018-05-22 DIAGNOSIS — E039 Hypothyroidism, unspecified: Secondary | ICD-10-CM | POA: Diagnosis not present

## 2018-05-22 DIAGNOSIS — Z1211 Encounter for screening for malignant neoplasm of colon: Secondary | ICD-10-CM | POA: Diagnosis not present

## 2018-05-22 DIAGNOSIS — Z0000 Encounter for general adult medical examination without abnormal findings: Secondary | ICD-10-CM

## 2018-05-22 DIAGNOSIS — E782 Mixed hyperlipidemia: Secondary | ICD-10-CM

## 2018-05-22 DIAGNOSIS — Z5181 Encounter for therapeutic drug level monitoring: Secondary | ICD-10-CM | POA: Diagnosis not present

## 2018-05-22 DIAGNOSIS — Z853 Personal history of malignant neoplasm of breast: Secondary | ICD-10-CM

## 2018-05-22 DIAGNOSIS — D692 Other nonthrombocytopenic purpura: Secondary | ICD-10-CM

## 2018-05-22 DIAGNOSIS — J309 Allergic rhinitis, unspecified: Secondary | ICD-10-CM

## 2018-05-23 ENCOUNTER — Encounter: Payer: Self-pay | Admitting: Family Medicine

## 2018-05-23 LAB — COMPREHENSIVE METABOLIC PANEL
A/G RATIO: 2.9 — AB (ref 1.2–2.2)
ALT: 17 IU/L (ref 0–32)
AST: 23 IU/L (ref 0–40)
Albumin: 4.7 g/dL — ABNORMAL HIGH (ref 3.6–4.6)
Alkaline Phosphatase: 96 IU/L (ref 39–117)
BUN/Creatinine Ratio: 23 (ref 12–28)
BUN: 20 mg/dL (ref 8–27)
Bilirubin Total: 1.1 mg/dL (ref 0.0–1.2)
CALCIUM: 10.1 mg/dL (ref 8.7–10.3)
CO2: 26 mmol/L (ref 20–29)
Chloride: 101 mmol/L (ref 96–106)
Creatinine, Ser: 0.87 mg/dL (ref 0.57–1.00)
GFR, EST AFRICAN AMERICAN: 71 mL/min/{1.73_m2} (ref >59)
GFR, EST NON AFRICAN AMERICAN: 62 mL/min/{1.73_m2} (ref >59)
GLUCOSE: 103 mg/dL — AB (ref 65–99)
Globulin, Total: 1.6 g/dL (ref 1.5–4.5)
Potassium: 4.1 mmol/L (ref 3.5–5.2)
Sodium: 142 mmol/L (ref 134–144)
TOTAL PROTEIN: 6.3 g/dL (ref 6.0–8.5)

## 2018-05-23 LAB — CBC WITH DIFFERENTIAL/PLATELET
BASOS ABS: 0 10*3/uL (ref 0.0–0.2)
Basos: 1 %
EOS (ABSOLUTE): 0.1 10*3/uL (ref 0.0–0.4)
Eos: 2 %
HEMOGLOBIN: 15.6 g/dL (ref 11.1–15.9)
Hematocrit: 46.4 % (ref 34.0–46.6)
Immature Grans (Abs): 0 10*3/uL (ref 0.0–0.1)
Immature Granulocytes: 0 %
LYMPHS ABS: 1.2 10*3/uL (ref 0.7–3.1)
Lymphs: 15 %
MCH: 29.7 pg (ref 26.6–33.0)
MCHC: 33.6 g/dL (ref 31.5–35.7)
MCV: 88 fL (ref 79–97)
Monocytes Absolute: 0.9 10*3/uL (ref 0.1–0.9)
Monocytes: 11 %
NEUTROS ABS: 5.9 10*3/uL (ref 1.4–7.0)
Neutrophils: 71 %
PLATELETS: 258 10*3/uL (ref 150–450)
RBC: 5.25 x10E6/uL (ref 3.77–5.28)
RDW: 11.9 % (ref 11.7–15.4)
WBC: 8.2 10*3/uL (ref 3.4–10.8)

## 2018-05-23 LAB — LIPID PANEL
CHOL/HDL RATIO: 2.5 ratio (ref 0.0–4.4)
Cholesterol, Total: 168 mg/dL (ref 100–199)
HDL: 66 mg/dL (ref >39)
LDL Calculated: 84 mg/dL (ref 0–99)
Triglycerides: 91 mg/dL (ref 0–149)
VLDL CHOLESTEROL CAL: 18 mg/dL (ref 5–40)

## 2018-06-05 DIAGNOSIS — H353221 Exudative age-related macular degeneration, left eye, with active choroidal neovascularization: Secondary | ICD-10-CM | POA: Diagnosis not present

## 2018-06-05 DIAGNOSIS — H353112 Nonexudative age-related macular degeneration, right eye, intermediate dry stage: Secondary | ICD-10-CM | POA: Diagnosis not present

## 2018-07-07 ENCOUNTER — Other Ambulatory Visit: Payer: Self-pay | Admitting: Family Medicine

## 2018-07-07 DIAGNOSIS — E782 Mixed hyperlipidemia: Secondary | ICD-10-CM

## 2018-07-12 ENCOUNTER — Other Ambulatory Visit: Payer: Self-pay | Admitting: Family Medicine

## 2018-07-12 DIAGNOSIS — I10 Essential (primary) hypertension: Secondary | ICD-10-CM

## 2018-07-14 ENCOUNTER — Other Ambulatory Visit: Payer: Self-pay | Admitting: Family Medicine

## 2018-07-14 DIAGNOSIS — I10 Essential (primary) hypertension: Secondary | ICD-10-CM

## 2018-07-20 ENCOUNTER — Other Ambulatory Visit: Payer: Self-pay | Admitting: Family Medicine

## 2018-07-20 DIAGNOSIS — I10 Essential (primary) hypertension: Secondary | ICD-10-CM

## 2018-09-09 DIAGNOSIS — H25811 Combined forms of age-related cataract, right eye: Secondary | ICD-10-CM | POA: Diagnosis not present

## 2018-10-12 ENCOUNTER — Other Ambulatory Visit: Payer: Self-pay | Admitting: Family Medicine

## 2018-10-12 DIAGNOSIS — I10 Essential (primary) hypertension: Secondary | ICD-10-CM

## 2018-10-15 DIAGNOSIS — H353112 Nonexudative age-related macular degeneration, right eye, intermediate dry stage: Secondary | ICD-10-CM | POA: Diagnosis not present

## 2018-10-16 ENCOUNTER — Other Ambulatory Visit: Payer: Self-pay | Admitting: Family Medicine

## 2018-10-16 DIAGNOSIS — I10 Essential (primary) hypertension: Secondary | ICD-10-CM

## 2018-10-23 DIAGNOSIS — H353112 Nonexudative age-related macular degeneration, right eye, intermediate dry stage: Secondary | ICD-10-CM | POA: Diagnosis not present

## 2018-10-23 DIAGNOSIS — H353221 Exudative age-related macular degeneration, left eye, with active choroidal neovascularization: Secondary | ICD-10-CM | POA: Diagnosis not present

## 2018-10-23 DIAGNOSIS — H1852 Epithelial (juvenile) corneal dystrophy: Secondary | ICD-10-CM | POA: Diagnosis not present

## 2018-10-23 DIAGNOSIS — H2513 Age-related nuclear cataract, bilateral: Secondary | ICD-10-CM | POA: Diagnosis not present

## 2018-10-23 DIAGNOSIS — D3132 Benign neoplasm of left choroid: Secondary | ICD-10-CM | POA: Diagnosis not present

## 2018-11-04 DIAGNOSIS — H3530 Unspecified macular degeneration: Secondary | ICD-10-CM | POA: Diagnosis not present

## 2018-11-04 DIAGNOSIS — H25812 Combined forms of age-related cataract, left eye: Secondary | ICD-10-CM | POA: Diagnosis not present

## 2018-11-04 DIAGNOSIS — H268 Other specified cataract: Secondary | ICD-10-CM | POA: Diagnosis not present

## 2018-11-19 NOTE — Progress Notes (Signed)
Chief Complaint  Patient presents with  . Hypothyroidism    fasting med check. Allergies have been really bad this season.  Also inactivity, not walking due to humidity. Feels like IBS is coming back, experiencing some symptoms lately.     HTN: Blood pressure hasn't been checked recently. Has a monitor at home. Denies dizziness or headaches, chest pain, muscle cramps, edema. Compliant with medications.   Hyperlipidemia follow-up: Patient is reportedly following a low-fat, low cholesterol diet. Compliant with medications and denies medication side effects.Lipids were at goal on last check.  Lab Results  Component Value Date   CHOL 168 05/22/2018   HDL 66 05/22/2018   LDLCALC 84 05/22/2018   TRIG 91 05/22/2018   CHOLHDL 2.5 05/22/2018   Hypothyroidism:She continues to notice decreasedhair growth on her legs, axilla(shaving less often); denies thinning of hair. No skin changes, bowel changes (other than IBS, gassy), energy is good. Compliant with taking her medication, taking it on an empty stomach (30 minutes prior to eating). She separates the thyroid medication from her calcium and vitamins by at least 2 hours. She is due for recheck today. Lab Results  Component Value Date   TSH 1.400 11/20/2017   H/o Impaired fasting glucose:Shelimits her sweets/sugar. Has small amounts ofchocolate (less often, bothers her stomach), cookies, ice cream (1-2x/week)--small portions, makes itlast a long time. A1c's and sugars have been normal on the last checks.Tries to get regular exercise. She has been walking circles in her house.  Can't go outside due to humidity. Lab Results  Component Value Date   HGBA1C 5.5 10/22/2016   History of R breast cancer, s/p lumpectomy and radiation. She last had mammo in 02/2018. Biopsy in 2018 showed fat necrosis with dense fibrosis (benign). She had genetic testing, which revealed CHEK2 mutation-- colonoscopies recommended every 5 years.  She was  reminded in August and February that she was due to see Dr. Watt Climes.  She is aware that she is due, has had too much on her plate with her recent cataract, and her husband and hasn't scheduled yet.  Allergies: year-round, previously well controlled with flonase and allegra daily. "It has been a bad summer."  She is sneezing some in the mornings.  She thinks she needs her ducts cleaned in her house.  Nose is more runny than usual, some phlegm in the morning (very light, not discolored). She has been cleaning her own house. She thinks it may be the allergens outside, but she isn't really outside much.  Stomach has been flaring more (IBS) in the last couple of weeks.  Feels more gassy.  Denies diarrhea or constipation. Tums sometimes helps. (she used to get a rx for IBS, not in many years). No significant discomfort, she just wanted to mention it.  Denies any black or bloody stools, no mucus in the stool.   PMH, PSH, SH reviewed  Outpatient Encounter Medications as of 11/20/2018  Medication Sig  . atenolol (TENORMIN) 25 MG tablet TAKE ONE TABLET BY MOUTH DAILY  . Calcium Carbonate (CALCIUM 500 PO) Take 1 capsule by mouth daily.  . Cholecalciferol (VITAMIN D) 2000 UNITS tablet Take 2,000 Units by mouth daily.    . fexofenadine (ALLEGRA) 180 MG tablet Take 180 mg by mouth daily.    . fluticasone (FLONASE) 50 MCG/ACT nasal spray SPRAY TWO SPRAYS INTO EACH NOSTRIL ONCE DAILY  . GLUCOSAMINE PO Take 2,000 mg by mouth 2 (two) times daily.    Marland Kitchen levothyroxine (SYNTHROID) 25 MCG tablet Take 1 tablet (  25 mcg total) by mouth daily.  Marland Kitchen losartan-hydrochlorothiazide (HYZAAR) 100-25 MG tablet TAKE ONE TABLET BY MOUTH DAILY  . moxifloxacin (VIGAMOX) 0.5 % ophthalmic solution   . Multiple Vitamins-Minerals (CENTRUM SILVER PO) Take 1 tablet by mouth daily.    . Multiple Vitamins-Minerals (PRESERVISION/LUTEIN) CAPS Take 1 capsule by mouth 2 (two) times daily.    . prednisoLONE acetate (PRED FORTE) 1 % ophthalmic  suspension   . PROLENSA 0.07 % SOLN   . simvastatin (ZOCOR) 20 MG tablet TAKE ONE TABLET BY MOUTH EVERY NIGHT AT BEDTIME  . [DISCONTINUED] Calcium Carbonate-Vitamin D (CALTRATE 600+D) 600-400 MG-UNIT per tablet Take 1 tablet by mouth daily.   . [DISCONTINUED] fluticasone (FLONASE) 50 MCG/ACT nasal spray SPRAY TWO SPRAYS INTO EACH NOSTRIL ONCE DAILY  . [DISCONTINUED] gatifloxacin (ZYMAXID) 0.5 % SOLN   . [DISCONTINUED] levothyroxine (SYNTHROID, LEVOTHROID) 25 MCG tablet Take 1 tablet (25 mcg total) by mouth daily.   No facility-administered encounter medications on file as of 11/20/2018.    Allergies  Allergen Reactions  . Percocet [Oxycodone-Acetaminophen] Other (See Comments)    Upset stomach    ROS:  No fever, chills, hair/skin, energy or weight changes.   No headaches, dizziness, chest pain, palpitations, shortness of breath.  No URI symptoms, just flare of allergies. No bleeding, bruising, rash Vision is much better since cataract surgery.  Still getting injections in left eye for macular degeneration. GI symptoms per HPI, none currently. Moods overall are good, denies depression, insomnia.   PHYSICAL EXAM:  BP 140/88   Pulse 60   Temp (!) 96.9 F (36.1 C) (Other (Comment))   Ht '5\' 4"'  (1.626 m)   Wt 132 lb 9.6 oz (60.1 kg)   BMI 22.76 kg/m   150/80 on repeat by MD, left arm  Wt Readings from Last 3 Encounters:  11/20/18 132 lb 9.6 oz (60.1 kg)  05/22/18 136 lb 12.8 oz (62.1 kg)  11/20/17 138 lb 3.2 oz (62.7 kg)    Well developed, talkative female in no distress HEENT: PERRL, EOMI, conjunctiva clear. Nasal mucosa is only mildly edematous, L>R. No erythema or purulence. Sinuses nontender. Wearing mask (removed for nasal exam, OP not examined) Neck: no lymphadenopathy, thyromegaly or carotid bruit. Heart: regular rate and rhythm, no murmur Lungs: clear bilaterally Abdomen: soft, nontender, no organomegaly or mass Back: no CVA or spinal tenderness Extremities: No  edema, 2+ pulses Skin:normal turgor, no rashes, lesions Psych: normal mood, affect, hygiene and grooming. Neuro: alert and oriented.Normal strength, gait.   ASSESSMENT/PLAN:  Essential hypertension, benign - Elevated today, possibly related to her sodium intake and less exercise.  Counseled re: normal results, will start checking at home more regularly  - Plan: Comprehensive metabolic panel  Hypothyroidism, unspecified type - euthyroid clinically; due for labs - Plan: TSH, levothyroxine (SYNTHROID) 25 MCG tablet  Mixed hyperlipidemia  Need for influenza vaccination - Plan: Flu Vaccine QUAD High Dose(Fluad)  Allergic rhinitis, unspecified seasonality, unspecified trigger - suboptimally controlled. Rec wearing mask when cleaning indoors. consider changing anithistamine and nasal steroid - Plan: fluticasone (FLONASE) 50 MCG/ACT nasal spray  Hypothyroidism, unspecified type - euthyroid by history, due for TSH check - Plan: TSH, levothyroxine (SYNTHROID) 25 MCG tablet   F/u as scheduled for CPE in 05/2019    Consider changing up your allergy medications--sometimes your body can get used to them. You can try Claritin or Zyrtec instead of Allegra, and you can try Nasacort or one of the other nasal steroid sprays.  Other options would be to  add medications (add nasal sprays, versus a daily pill (Singulair)). It may not be related to the allergens outside, since you aren't out much--think more indoor allergens (dust mites, mold, etc). Wear a mask when you clean the house.  Please try and periodically check your blood pressure at home.  If it is consistently over 135-140/85-90, please follow up here.  Be sure to limit the sodium in your diet, and get regular exercise. Limit the sodium in your diet.

## 2018-11-20 ENCOUNTER — Other Ambulatory Visit: Payer: Self-pay

## 2018-11-20 ENCOUNTER — Ambulatory Visit (INDEPENDENT_AMBULATORY_CARE_PROVIDER_SITE_OTHER): Payer: Medicare Other | Admitting: Family Medicine

## 2018-11-20 ENCOUNTER — Encounter: Payer: Self-pay | Admitting: Family Medicine

## 2018-11-20 VITALS — BP 140/88 | HR 60 | Temp 96.9°F | Ht 64.0 in | Wt 132.6 lb

## 2018-11-20 DIAGNOSIS — E782 Mixed hyperlipidemia: Secondary | ICD-10-CM

## 2018-11-20 DIAGNOSIS — E039 Hypothyroidism, unspecified: Secondary | ICD-10-CM

## 2018-11-20 DIAGNOSIS — I10 Essential (primary) hypertension: Secondary | ICD-10-CM | POA: Diagnosis not present

## 2018-11-20 DIAGNOSIS — Z23 Encounter for immunization: Secondary | ICD-10-CM | POA: Diagnosis not present

## 2018-11-20 DIAGNOSIS — J309 Allergic rhinitis, unspecified: Secondary | ICD-10-CM | POA: Diagnosis not present

## 2018-11-20 MED ORDER — FLUTICASONE PROPIONATE 50 MCG/ACT NA SUSP: NASAL | 3 refills | Status: DC

## 2018-11-20 NOTE — Patient Instructions (Signed)
Consider changing up your allergy medications--sometimes your body can get used to them. You can try Claritin or Zyrtec instead of Allegra, and you can try Nasacort or one of the other nasal steroid sprays.  Other options would be to add medications (add nasal sprays, versus a daily pill (Singulair)). It may not be related to the allergens outside, since you aren't out much--think more indoor allergens (dust mites, mold, etc). Wear a mask when you clean the house.  Please try and periodically check your blood pressure at home.  If it is consistently over 135-140/85-90, please follow up here.  Be sure to limit the sodium in your diet, and get regular exercise. Limit the sodium in your diet.   Low-Sodium Eating Plan Sodium, which is an element that makes up salt, helps you maintain a healthy balance of fluids in your body. Too much sodium can increase your blood pressure and cause fluid and waste to be held in your body. Your health care provider or dietitian may recommend following this plan if you have high blood pressure (hypertension), kidney disease, liver disease, or heart failure. Eating less sodium can help lower your blood pressure, reduce swelling, and protect your heart, liver, and kidneys. What are tips for following this plan? General guidelines  Most people on this plan should limit their sodium intake to 1,500-2,000 mg (milligrams) of sodium each day. Reading food labels   The Nutrition Facts label lists the amount of sodium in one serving of the food. If you eat more than one serving, you must multiply the listed amount of sodium by the number of servings.  Choose foods with less than 140 mg of sodium per serving.  Avoid foods with 300 mg of sodium or more per serving. Shopping  Look for lower-sodium products, often labeled as "low-sodium" or "no salt added."  Always check the sodium content even if foods are labeled as "unsalted" or "no salt added".  Buy fresh foods. ?  Avoid canned foods and premade or frozen meals. ? Avoid canned, cured, or processed meats  Buy breads that have less than 80 mg of sodium per slice. Cooking  Eat more home-cooked food and less restaurant, buffet, and fast food.  Avoid adding salt when cooking. Use salt-free seasonings or herbs instead of table salt or sea salt. Check with your health care provider or pharmacist before using salt substitutes.  Cook with plant-based oils, such as canola, sunflower, or olive oil. Meal planning  When eating at a restaurant, ask that your food be prepared with less salt or no salt, if possible.  Avoid foods that contain MSG (monosodium glutamate). MSG is sometimes added to [place] food, bouillon, and some canned foods. What foods are recommended? The items listed may not be a complete list. Talk with your dietitian about what dietary choices are best for you. Grains Low-sodium cereals, including oats, puffed wheat and rice, and shredded wheat. Low-sodium crackers. Unsalted rice. Unsalted pasta. Low-sodium bread. Whole-grain breads and whole-grain pasta. Vegetables Fresh or frozen vegetables. "No salt added" canned vegetables. "No salt added" tomato sauce and paste. Low-sodium or reduced-sodium tomato and vegetable juice. Fruits Fresh, frozen, or canned fruit. Fruit juice. Meats and other protein foods Fresh or frozen (no salt added) meat, poultry, seafood, and fish. Low-sodium canned tuna and salmon. Unsalted nuts. Dried peas, beans, and lentils without added salt. Unsalted canned beans. Eggs. Unsalted nut butters. Dairy Milk. Soy milk. Cheese that is naturally low in sodium, such as ricotta cheese, fresh mozzarella,  or Swiss cheese Low-sodium or reduced-sodium cheese. Cream cheese. Yogurt. Fats and oils Unsalted butter. Unsalted margarine with no trans fat. Vegetable oils such as canola or olive oils. Seasonings and other foods Fresh and dried herbs and spices. Salt-free seasonings.  Low-sodium mustard and ketchup. Sodium-free salad dressing. Sodium-free light mayonnaise. Fresh or refrigerated horseradish. Lemon juice. Vinegar. Homemade, reduced-sodium, or low-sodium soups. Unsalted popcorn and pretzels. Low-salt or salt-free chips. What foods are not recommended? The items listed may not be a complete list. Talk with your dietitian about what dietary choices are best for you. Grains Instant hot cereals. Bread stuffing, pancake, and biscuit mixes. Croutons. Seasoned rice or pasta mixes. Noodle soup cups. Boxed or frozen macaroni and cheese. Regular salted crackers. Self-rising flour. Vegetables Sauerkraut, pickled vegetables, and relishes. Olives. [place] fries. Onion rings. Regular canned vegetables (not low-sodium or reduced-sodium). Regular canned tomato sauce and paste (not low-sodium or reduced-sodium). Regular tomato and vegetable juice (not low-sodium or reduced-sodium). Frozen vegetables in sauces. Meats and other protein foods Meat or fish that is salted, canned, smoked, spiced, or pickled. Bacon, ham, sausage, hotdogs, corned beef, chipped beef, packaged lunch meats, salt pork, jerky, pickled herring, anchovies, regular canned tuna, sardines, salted nuts. Dairy Processed cheese and cheese spreads. Cheese curds. Blue cheese. Feta cheese. String cheese. Regular cottage cheese. Buttermilk. Canned milk. Fats and oils Salted butter. Regular margarine. Ghee. Bacon fat. Seasonings and other foods Onion salt, garlic salt, seasoned salt, table salt, and sea salt. Canned and packaged gravies. Worcestershire sauce. Tartar sauce. Barbecue sauce. Teriyaki sauce. Soy sauce, including reduced-sodium. Steak sauce. Fish sauce. Oyster sauce. Cocktail sauce. Horseradish that you find on the shelf. Regular ketchup and mustard. Meat flavorings and tenderizers. Bouillon cubes. Hot sauce and Tabasco sauce. Premade or packaged marinades. Premade or packaged taco seasonings. Relishes. Regular  salad dressings. Salsa. Potato and tortilla chips. Corn chips and puffs. Salted popcorn and pretzels. Canned or dried soups. Pizza. Frozen entrees and pot pies. Summary  Eating less sodium can help lower your blood pressure, reduce swelling, and protect your heart, liver, and kidneys.  Most people on this plan should limit their sodium intake to 1,500-2,000 mg (milligrams) of sodium each day.  Canned, boxed, and frozen foods are high in sodium. Restaurant foods, fast foods, and pizza are also very high in sodium. You also get sodium by adding salt to food.  Try to cook at home, eat more fresh fruits and vegetables, and eat less fast food, canned, processed, or prepared foods. This information is not intended to replace advice given to you by your health care provider. Make sure you discuss any questions you have with your health care provider. Document Released: 09/01/2001 Document Revised: 02/22/2017 Document Reviewed: 03/05/2016 Elsevier Patient Education  2020 [employer].

## 2018-11-21 ENCOUNTER — Encounter: Payer: Self-pay | Admitting: Family Medicine

## 2018-11-21 LAB — COMPREHENSIVE METABOLIC PANEL
ALT: 16 IU/L (ref 0–32)
AST: 23 IU/L (ref 0–40)
Albumin/Globulin Ratio: 2.6 — ABNORMAL HIGH (ref 1.2–2.2)
Albumin: 4.5 g/dL (ref 3.6–4.6)
Alkaline Phosphatase: 94 IU/L (ref 39–117)
BUN/Creatinine Ratio: 22 (ref 12–28)
BUN: 15 mg/dL (ref 8–27)
Bilirubin Total: 1.6 mg/dL — ABNORMAL HIGH (ref 0.0–1.2)
CO2: 26 mmol/L (ref 20–29)
Calcium: 10.3 mg/dL (ref 8.7–10.3)
Chloride: 95 mmol/L — ABNORMAL LOW (ref 96–106)
Creatinine, Ser: 0.68 mg/dL (ref 0.57–1.00)
GFR calc Af Amer: 94 mL/min/{1.73_m2} (ref >59)
GFR calc non Af Amer: 81 mL/min/{1.73_m2} (ref >59)
Globulin, Total: 1.7 g/dL (ref 1.5–4.5)
Glucose: 91 mg/dL (ref 65–99)
Potassium: 4.2 mmol/L (ref 3.5–5.2)
Sodium: 141 mmol/L (ref 134–144)
Total Protein: 6.2 g/dL (ref 6.0–8.5)

## 2018-11-21 LAB — TSH: TSH: 1.25 u[IU]/mL (ref 0.450–4.500)

## 2018-11-21 MED ORDER — LEVOTHYROXINE SODIUM 25 MCG PO TABS: 25.0000 ug | ORAL_TABLET | Freq: Every day | ORAL | 3 refills | Status: DC

## 2018-12-29 DIAGNOSIS — Z961 Presence of intraocular lens: Secondary | ICD-10-CM | POA: Diagnosis not present

## 2019-01-01 DIAGNOSIS — H353112 Nonexudative age-related macular degeneration, right eye, intermediate dry stage: Secondary | ICD-10-CM | POA: Diagnosis not present

## 2019-01-01 DIAGNOSIS — H353221 Exudative age-related macular degeneration, left eye, with active choroidal neovascularization: Secondary | ICD-10-CM | POA: Diagnosis not present

## 2019-01-04 ENCOUNTER — Other Ambulatory Visit: Payer: Self-pay | Admitting: Family Medicine

## 2019-01-04 DIAGNOSIS — E782 Mixed hyperlipidemia: Secondary | ICD-10-CM

## 2019-01-05 ENCOUNTER — Other Ambulatory Visit: Payer: Self-pay | Admitting: Family Medicine

## 2019-01-05 DIAGNOSIS — E782 Mixed hyperlipidemia: Secondary | ICD-10-CM

## 2019-01-14 ENCOUNTER — Other Ambulatory Visit: Payer: Self-pay | Admitting: Family Medicine

## 2019-01-14 DIAGNOSIS — I10 Essential (primary) hypertension: Secondary | ICD-10-CM

## 2019-02-03 ENCOUNTER — Other Ambulatory Visit: Payer: Self-pay | Admitting: Family Medicine

## 2019-02-03 DIAGNOSIS — Z1231 Encounter for screening mammogram for malignant neoplasm of breast: Secondary | ICD-10-CM

## 2019-02-11 DIAGNOSIS — L820 Inflamed seborrheic keratosis: Secondary | ICD-10-CM | POA: Diagnosis not present

## 2019-02-11 DIAGNOSIS — Z85828 Personal history of other malignant neoplasm of skin: Secondary | ICD-10-CM | POA: Diagnosis not present

## 2019-02-11 DIAGNOSIS — D692 Other nonthrombocytopenic purpura: Secondary | ICD-10-CM | POA: Diagnosis not present

## 2019-02-11 DIAGNOSIS — L821 Other seborrheic keratosis: Secondary | ICD-10-CM | POA: Diagnosis not present

## 2019-02-11 DIAGNOSIS — L570 Actinic keratosis: Secondary | ICD-10-CM | POA: Diagnosis not present

## 2019-03-12 DIAGNOSIS — H353221 Exudative age-related macular degeneration, left eye, with active choroidal neovascularization: Secondary | ICD-10-CM | POA: Diagnosis not present

## 2019-03-12 DIAGNOSIS — H353112 Nonexudative age-related macular degeneration, right eye, intermediate dry stage: Secondary | ICD-10-CM | POA: Diagnosis not present

## 2019-03-30 ENCOUNTER — Other Ambulatory Visit: Payer: Self-pay

## 2019-03-30 ENCOUNTER — Ambulatory Visit
Admission: RE | Admit: 2019-03-30 | Discharge: 2019-03-30 | Disposition: A | Discharge status: Home / Self Care | Payer: Medicare Other | Source: Ambulatory Visit | Attending: Family Medicine | Admitting: Family Medicine

## 2019-03-30 DIAGNOSIS — Z1231 Encounter for screening mammogram for malignant neoplasm of breast: Secondary | ICD-10-CM | POA: Diagnosis not present

## 2019-04-09 ENCOUNTER — Other Ambulatory Visit: Payer: Self-pay | Admitting: Family Medicine

## 2019-04-09 DIAGNOSIS — I10 Essential (primary) hypertension: Secondary | ICD-10-CM

## 2019-05-27 NOTE — Progress Notes (Signed)
Chief Complaint  Patient presents with  . Medicare Wellness    fasting AWV with pelvic. Is having some IBS symptoms (was on meds in the early 2000's for this with Dr. Verner Chol). Has not contacted Dr. Watt Climes to schedule colonoscopy.    Courtney Castro is a 86 y.o. female who presents for annual physical exam, Medicare wellness visit and follow-up on chronic medical conditions.    HTN: She hasn't been checking her blood pressure at home.  Denies dizziness or headaches, chest pain, muscle cramps, edema. Compliant with medications--losartan HCT 100-25 and atenolol 78m. She denies side effects.   Hyperlipidemia follow-up: Patient is reportedly following a low-fat, low cholesterol diet. Compliant with medications (simvastatin 240m and denies medication side effects. She is due for recheck, was at goal last year. Lab Results  Component Value Date   CHOL 168 05/22/2018   HDL 66 05/22/2018   LDLCALC 84 05/22/2018   TRIG 91 05/22/2018   CHOLHDL 2.5 05/22/2018   Hypothyroidism:She denies skin/hair/nail changes, bowel changes, energy is good, weight is stable. Compliant with taking her medication, taking it on an empty stomach (30 minutes prior to eating). She separates the thyroid medication from her calcium and vitamins by at least 2 hours (takes at lunch). She was adequately replaced per last check in August. Lab Results  Component Value Date   TSH 1.250 11/20/2018   H/oImpaired fasting glucose:Shelimits her sweets/sugar. Has small amounts ofchocolate, cookies, ice cream (1x/week)--small portions, makes itlast a long time. A1c's have been normal (so stopped checking).  She had fasting glucose of 103 at her physical last year, 91 in 10/2018.Tries to get regular exercise.  Lab Results  Component Value Date   HGBA1C 5.5 10/22/2016   History of R breast cancer, s/p lumpectomy and radiation. She last had mammo in 03/2019. Biopsy in 2018showed fat necrosis with dense fibrosis  (benign). 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 MaWatt Climes She has not called yet, but plans to.  H/o IBS:  Had been flaring when last seen in August, more gassy, no constipation or diarrhea.  Today she reports that after having a small sugar-free chocolate, she gets gassy-feeling. Sometimes she has a slightly loose bowel movement after.  She currently denies any abdominal pain.  No black or bloody stools, no mucus in the stool.  Allergies: year-round, controlled with flonase and allegra daily.She had bad flare over the summer, at which time we discussed trying different inhaled steroids and oral histamines, and discussed other possible treatments as well.  She continues to do well currently.  Macular degeneration--she has been getting injections into the [facility] by Dr. GrCordelia Penlast was in 02/2019.  She had stretch time between appointments, and is scheduled for next week.   Immunization History  Administered Date(s) Administered  . Fluad Quad(high Dose 65+) 11/20/2018  . Influenza Split 12/11/2009, 01/02/2011, 12/21/2011  . Influenza, High Dose Seasonal PF 12/05/2012, 12/17/2013, 12/13/2014, 12/22/2015, 01/01/2017, 12/04/2017  . PFIZER SARS-COV-2 Vaccination 04/17/2019, 05/07/2019  . Pneumococcal Conjugate-13 03/04/2014  . Pneumococcal Polysaccharide-23 11/07/2001, 01/31/2011  . Td 12/18/2004  . Tdap 01/31/2011, 03/22/2017  . Zoster 08/25/2007  . Zoster Recombinat (Shingrix) 07/10/2016, 10/18/2016   Last Pap smear: 2011, s/p hysterectomy for benign reasons  Last mammogram: 03/2019 Last colonoscopy: June 2014 with Dr. MaWatt ClimesNormal per pt, to repeat in 5 years (past due) Last DEXA:05/2016 normal Dentist: twice  yearly  Ophtho: yearly; sees Dr. Cordelia Pen more often for injections Exercise:Walks around in the house some, outside when nicer.   Other doctors caring  for patient include: Ophtho: Dr. Cordelia Pen (retinal specialist), at [facility]. Dr. Kathrin Penner Dentist: Dr. Irineo Axon GI: Dr. Watt Climes Derm: Dr. Derrel Nip Podiatry: Dr. Fritzi Mandes (no longer sees) Ortho: Dr. Wynelle Link, Dr. Rhona Raider Neurosurg: Dr. Weston Settle, Dr. Vertell Limber   Depression screen: PHQ-2 score of 2, PHQ-9 score of 4.  Related to dealing with her husband, feels bad after she has raised her voice with him. Feels much better when she gets out by herself (even if just grocery shopping). Fall screen-- one, tripped on a cord in her bedroom, bruised her knee Functional Status survey notable for rare urge incontinence, very slight memory change Mini-Cog screen normal See full screens in epic  End of Life Discussion: Has living will and healthcare power of attorney forms at home; unsure if she completed with her attorney or if she plans to re-do and get notarized.  PMH, PSH, SH and FH were reviewed and updated  Outpatient Encounter Medications as of 05/28/2019  Medication Sig  . atenolol (TENORMIN) 25 MG tablet TAKE ONE TABLET BY MOUTH DAILY  . Calcium Carbonate (CALCIUM 500 PO) Take 1 capsule by mouth daily.  . Cholecalciferol (VITAMIN D) 2000 UNITS tablet Take 2,000 Units by mouth daily.    . fexofenadine (ALLEGRA) 180 MG tablet Take 180 mg by mouth daily.    . fluticasone (FLONASE) 50 MCG/ACT nasal spray SPRAY TWO SPRAYS INTO EACH NOSTRIL ONCE DAILY  . GLUCOSAMINE PO Take 2,000 mg by mouth 2 (two) times daily.    Marland Kitchen levothyroxine (SYNTHROID) 25 MCG tablet Take 1 tablet (25 mcg total) by mouth daily.  Marland Kitchen losartan-hydrochlorothiazide (HYZAAR) 100-25 MG tablet TAKE ONE TABLET BY MOUTH DAILY  . Multiple Vitamins-Minerals (CENTRUM SILVER PO) Take 1 tablet by mouth daily.    . Multiple Vitamins-Minerals (PRESERVISION/LUTEIN) CAPS Take 1 capsule by mouth 2 (two) times daily.    . simvastatin (ZOCOR) 20 MG tablet TAKE 1 TABLET BY MOUTH EVERY NIGHT AT BEDTIME  . [DISCONTINUED] moxifloxacin (VIGAMOX) 0.5 % ophthalmic  solution   . [DISCONTINUED] prednisoLONE acetate (PRED FORTE) 1 % ophthalmic suspension   . [DISCONTINUED] PROLENSA 0.07 % SOLN    No facility-administered encounter medications on file as of 05/28/2019.   Allergies  Allergen Reactions  . Percocet [Oxycodone-Acetaminophen] Other (See Comments)    Upset stomach    ROS: The patient denies anorexia, fever, weight changes, headaches, vision changes, decreased hearing, ear pain, sore throat, breast concerns, chest pain, palpitations, dizziness, syncope, dyspnea on exertion, swelling, nausea, vomiting, diarrhea, constipation, abdominal pain, melena, hematochezia, indigestion/heartburn, hematuria, vaginal bleeding, genital lesions, joint pains, numbness, tingling, weakness, tremor, suspicious skin lesions, depression, anxiety, abnormal bleeding/bruising, or enlarged lymph nodes.  She denies any urinary complaints, just rare urge incontinence. Allergies are controlled, slight dry cough at night when she first lays down (from PND) No longer having any neck or knee pain. Occasional mild depression--gets upset at herself when she raises her voice when frustrated with her husband.  PHYSICAL EXAM:  BP 140/80   Pulse 64   Temp (!) 96.9 F (36.1 C) (Other (Comment))   Ht 5' 4.5" (1.638 m)   Wt 134 lb 6.4 oz (61 kg)   BMI 22.71 kg/m   Wt Readings from Last 3 Encounters:  05/28/19 134 lb 6.4 oz (61 kg)  11/20/18 132 lb 9.6 oz (60.1 kg)  05/22/18 136 lb 12.8 oz (62.1 kg)  114/68 on repeat by MD  General Appearance:  Alert, cooperative, no distress, appearsyounger thanstated age   Head:  Normocephalic, atraumatic  Eyes:  PERRL, conjunctiva/corneas clear, EOM's intact, fundi ok--no hemorrhages, exudates or cataracts  Ears:  Normal TM's and external ear canals   Nose:  Not examined, wearing mask due to COVID-19 pandemic  Throat:  Not examined, wearing mask due to COVID-19 pandemic  Neck:  Supple, no lymphadenopathy;  thyroid: no enlargement/ tenderness/nodules; no carotid bruit or JVD   Back:  Spine nontender, no curvature, ROM normal, no CVA tenderness.   Lungs:  Clear to auscultation bilaterally without wheezes, rales or ronchi; respirations unlabored   Chest Wall:  No tenderness or deformity   Heart:  Regular rate and rhythm, S1 and S2 normal, no murmur, rub or gallop   Breast Exam:  No tenderness, masses, or nipple discharge or inversion. No axillary lymphadenopathy. WHSS R breast at 9 o'clock, firm along scar, nontender.There is also a WHSS in the right axilla. Left breast larger than right.Many SK's and some skin tags.  Abdomen:  Soft, non-tender, nondistended, normoactive bowel sounds,  no masses, no hepatosplenomegaly   Genitalia:  Normal external genitalia without lesions,+atrophic changes. BUS and vagina normal; Adnexa not palpable. Uterus surgically absent. No masses or tenderness. Pap not performed   Rectal:  Normal tone, no masses or tenderness; guaiac negative stool   Extremities:  No clubbing, cyanosis or edema. (didn't remove socks)  Pulses:  2+ and symmetric all extremities   Skin:  Skin color, texture, turgor normal, no rashes or lesions. Many seborrheic keratosis throughout, especially on chest/abdomen. Does not currently have bruising on her hands (noted in past, and had recently per pt)  Lymph nodes:  Cervical, supraclavicular, and axillary nodes normal   Neurologic:  CNII-XII intact, normal strength, sensation and gait; reflexes 2+ and symmetric throughout    Psych: Normal mood, affect, hygiene and grooming.   ASSESSMENT/PLAN:  Annual physical exam - Plan: CBC with Differential/Platelet, Comprehensive metabolic panel, Lipid panel  Medicare annual wellness visit, subsequent  Essential hypertension, benign - controlled on current regimen, to periodically monitor at home and contact us if remains elevated - Plan:  Comprehensive metabolic panel  Hypothyroidism, unspecified type - euthyroid by history  Mixed hyperlipidemia - at goal on current regimen in past, due for recheck - Plan: Lipid panel  Allergic rhinitis, unspecified seasonality, unspecified trigger - currently doing well. continue current regimen  Medication monitoring encounter - Plan: CBC with Differential/Platelet, Comprehensive metabolic panel, Lipid panel  History of breast cancer  Colon cancer screening - past due, at higher risk due to CHEK-2 mutation and h/o polyps. To contact GI  Senile purpura ([place]) - None currently, but we did discuss the bruising she gets on her hands/forearms and causes  Her gassiness may be related to the artificial sweetener in her sugar-free candy.  Counseled re: diet.  Pt to increase exercise and add in weight-bearing exercise. To contact Dr. Watt Climes regarding colonoscopy (and ensure he is aware of the genetic mutation, diagnosed after her last visit with him). Counseled regarding issues with husband, enjoying time away, and to seek counseling to help with adjusting, especially if his memory issues progress/worsen.   Discussed monthly self breast exams and yearly mammograms; at least 30 minutes of aerobic activity at least 5 days/week, weight-bearing exercise 2/week; proper sunscreen use reviewed; healthy diet, including goals of calcium and vitamin D intake and alcohol recommendations (less than or equal to 1 drink/day) reviewed; regular seatbelt use;  changing batteries in smoke detectors. Immunization recommendations discussed--UTD,continueyearlyhigh doseflu shots.Colonoscopy was due June,2019, advised to contact Dr. Watt Climes  Encouraged to fill outLiving Will and Healthcare POA formsand to give [place] completed copies, after notarized.  She is Full Code, Full Care. MOSTform reviewed/signed   Medicare Attestation I have personally reviewed: The patient's medical and social history Their use of  alcohol, tobacco or illicit drugs Their current medications and supplements The patient's functional ability including ADLs,fall risks, home safety risks, cognitive, and hearing and visual impairment Diet and physical activities Evidence for depression or mood disorders  The patient's weight, height, BMI have been recorded in the chart.  I have made referrals, counseling, and provided education to the patient based on review of the above and I have provided the patient with a written personalized care plan for preventive services.

## 2019-05-27 NOTE — Patient Instructions (Addendum)
  HEALTH MAINTENANCE RECOMMENDATIONS:  It is recommended that you get at least 30 minutes of aerobic exercise at least 5 days/week (for weight loss, you may need as much as 60-90 minutes). This can be any activity that gets your heart rate up. This can be divided in 10-15 minute intervals if needed, but try and build up your endurance at least once a week.  Weight bearing exercise is also recommended twice weekly.  Eat a healthy diet with lots of vegetables, fruits and fiber.  "Colorful" foods have a lot of vitamins (ie green vegetables, tomatoes, red peppers, etc).  Limit sweet tea, regular sodas and alcoholic beverages, all of which has a lot of calories and sugar.  Up to 1 alcoholic drink daily may be beneficial for women (unless trying to lose weight, watch sugars).  Drink a lot of water.  Calcium recommendations are 1200-1500 mg daily (1500 mg for postmenopausal women or women without ovaries), and vitamin D 1000 IU daily.  This should be obtained from diet and/or supplements (vitamins), and calcium should not be taken all at once, but in divided doses.  Monthly self breast exams and yearly mammograms for women over the age of 2 is recommended.  Sunscreen of at least SPF 30 should be used on all sun-exposed parts of the skin when outside between the hours of 10 am and 4 pm (not just when at beach or pool, but even with exercise, golf, tennis, and yard work!)  Use a sunscreen that says "broad spectrum" so it covers both UVA and UVB rays, and make sure to reapply every 1-2 hours.  Remember to change the batteries in your smoke detectors when changing your clock times in the spring and fall. Carbon monoxide detectors are recommended for your home.  Use your seat belt every time you are in a car, and please drive safely and not be distracted with cell phones and texting while driving.   Ms. Diss , Thank you for taking time to come for your Medicare Wellness Visit. I appreciate your ongoing  commitment to your health goals. Please review the following plan we discussed and let me know if I can assist you in the future.    This is a list of the screening recommended for you and due dates:  Health Maintenance  Topic Date Due  . Tetanus Vaccine  03/23/2027  . Flu Shot  Completed  . DEXA scan (bone density measurement)  Completed  . Pneumonia vaccines  Completed   Colonoscopies are due every 5 years.  I believe you were due in 2019. Please contact Dr. Perley Jain office. You are at increased risk due to CHEK2 mutation.  Please make sure that he is aware of this.  Periodically check your blood pressure at home (once a week). Contact us if consistently over 140/90, or feel free to send in lists for my review.  It may be the artificial sweeteners that are causing some increased gas (this is common).  Consider seeing a therapist if moods are not improving.  Please get [place] copies of living will and healthcare power of attorney forms to be scanned into your chart (once notarized, if you don't already have completed forms with your attorney).

## 2019-05-28 ENCOUNTER — Ambulatory Visit (INDEPENDENT_AMBULATORY_CARE_PROVIDER_SITE_OTHER): Payer: Medicare Other | Admitting: Family Medicine

## 2019-05-28 ENCOUNTER — Encounter: Payer: Self-pay | Admitting: Family Medicine

## 2019-05-28 ENCOUNTER — Other Ambulatory Visit: Payer: Self-pay

## 2019-05-28 VITALS — BP 114/68 | HR 64 | Temp 96.9°F | Ht 64.5 in | Wt 134.4 lb

## 2019-05-28 DIAGNOSIS — Z1211 Encounter for screening for malignant neoplasm of colon: Secondary | ICD-10-CM

## 2019-05-28 DIAGNOSIS — Z853 Personal history of malignant neoplasm of breast: Secondary | ICD-10-CM

## 2019-05-28 DIAGNOSIS — J309 Allergic rhinitis, unspecified: Secondary | ICD-10-CM | POA: Diagnosis not present

## 2019-05-28 DIAGNOSIS — E782 Mixed hyperlipidemia: Secondary | ICD-10-CM | POA: Diagnosis not present

## 2019-05-28 DIAGNOSIS — E039 Hypothyroidism, unspecified: Secondary | ICD-10-CM | POA: Diagnosis not present

## 2019-05-28 DIAGNOSIS — Z5181 Encounter for therapeutic drug level monitoring: Secondary | ICD-10-CM

## 2019-05-28 DIAGNOSIS — I10 Essential (primary) hypertension: Secondary | ICD-10-CM

## 2019-05-28 DIAGNOSIS — Z0000 Encounter for general adult medical examination without abnormal findings: Secondary | ICD-10-CM

## 2019-05-28 DIAGNOSIS — D692 Other nonthrombocytopenic purpura: Secondary | ICD-10-CM

## 2019-05-29 ENCOUNTER — Encounter: Payer: Self-pay | Admitting: Family Medicine

## 2019-05-29 LAB — COMPREHENSIVE METABOLIC PANEL
ALT: 17 IU/L (ref 0–32)
AST: 27 IU/L (ref 0–40)
Albumin/Globulin Ratio: 2.6 — ABNORMAL HIGH (ref 1.2–2.2)
Albumin: 4.6 g/dL (ref 3.6–4.6)
Alkaline Phosphatase: 110 IU/L (ref 39–117)
BUN/Creatinine Ratio: 23 (ref 12–28)
BUN: 18 mg/dL (ref 8–27)
Bilirubin Total: 1.3 mg/dL — ABNORMAL HIGH (ref 0.0–1.2)
CO2: 26 mmol/L (ref 20–29)
Calcium: 10.7 mg/dL — ABNORMAL HIGH (ref 8.7–10.3)
Chloride: 102 mmol/L (ref 96–106)
Creatinine, Ser: 0.79 mg/dL (ref 0.57–1.00)
GFR calc Af Amer: 79 mL/min/{1.73_m2} (ref >59)
GFR calc non Af Amer: 69 mL/min/{1.73_m2} (ref >59)
Globulin, Total: 1.8 g/dL (ref 1.5–4.5)
Glucose: 88 mg/dL (ref 65–99)
Potassium: 4.5 mmol/L (ref 3.5–5.2)
Sodium: 143 mmol/L (ref 134–144)
Total Protein: 6.4 g/dL (ref 6.0–8.5)

## 2019-05-29 LAB — CBC WITH DIFFERENTIAL/PLATELET
Basophils Absolute: 0.1 10*3/uL (ref 0.0–0.2)
Basos: 1 %
EOS (ABSOLUTE): 0.1 10*3/uL (ref 0.0–0.4)
Eos: 1 %
Hematocrit: 47.6 % — ABNORMAL HIGH (ref 34.0–46.6)
Hemoglobin: 16.1 g/dL — ABNORMAL HIGH (ref 11.1–15.9)
Immature Grans (Abs): 0 10*3/uL (ref 0.0–0.1)
Immature Granulocytes: 0 %
Lymphocytes Absolute: 1.3 10*3/uL (ref 0.7–3.1)
Lymphs: 13 %
MCH: 30.3 pg (ref 26.6–33.0)
MCHC: 33.8 g/dL (ref 31.5–35.7)
MCV: 90 fL (ref 79–97)
Monocytes Absolute: 1.1 10*3/uL — ABNORMAL HIGH (ref 0.1–0.9)
Monocytes: 11 %
Neutrophils Absolute: 7.1 10*3/uL — ABNORMAL HIGH (ref 1.4–7.0)
Neutrophils: 74 %
Platelets: 253 10*3/uL (ref 150–450)
RBC: 5.31 x10E6/uL — ABNORMAL HIGH (ref 3.77–5.28)
RDW: 11.9 % (ref 11.7–15.4)
WBC: 9.6 10*3/uL (ref 3.4–10.8)

## 2019-05-29 LAB — LIPID PANEL
Chol/HDL Ratio: 2.4 ratio (ref 0.0–4.4)
Cholesterol, Total: 167 mg/dL (ref 100–199)
HDL: 69 mg/dL (ref >39)
LDL Chol Calc (NIH): 81 mg/dL (ref 0–99)
Triglycerides: 93 mg/dL (ref 0–149)
VLDL Cholesterol Cal: 17 mg/dL (ref 5–40)

## 2019-06-04 DIAGNOSIS — H353112 Nonexudative age-related macular degeneration, right eye, intermediate dry stage: Secondary | ICD-10-CM | POA: Diagnosis not present

## 2019-06-04 DIAGNOSIS — Z961 Presence of intraocular lens: Secondary | ICD-10-CM | POA: Diagnosis not present

## 2019-06-04 DIAGNOSIS — H18523 Epithelial (juvenile) corneal dystrophy, bilateral: Secondary | ICD-10-CM | POA: Diagnosis not present

## 2019-06-04 DIAGNOSIS — H353221 Exudative age-related macular degeneration, left eye, with active choroidal neovascularization: Secondary | ICD-10-CM | POA: Diagnosis not present

## 2019-06-04 DIAGNOSIS — D3132 Benign neoplasm of left choroid: Secondary | ICD-10-CM | POA: Diagnosis not present

## 2019-07-05 ENCOUNTER — Other Ambulatory Visit: Payer: Self-pay | Admitting: Family Medicine

## 2019-07-05 DIAGNOSIS — I10 Essential (primary) hypertension: Secondary | ICD-10-CM

## 2019-07-08 ENCOUNTER — Other Ambulatory Visit: Payer: Self-pay | Admitting: Family Medicine

## 2019-07-08 DIAGNOSIS — I10 Essential (primary) hypertension: Secondary | ICD-10-CM

## 2019-07-08 DIAGNOSIS — E782 Mixed hyperlipidemia: Secondary | ICD-10-CM

## 2019-07-17 ENCOUNTER — Emergency Department (HOSPITAL_COMMUNITY): Payer: Medicare Other

## 2019-07-17 ENCOUNTER — Other Ambulatory Visit: Payer: Self-pay

## 2019-07-17 ENCOUNTER — Emergency Department (HOSPITAL_COMMUNITY)
Admission: EM | Admit: 2019-07-17 | Discharge: 2019-07-17 | Disposition: A | Discharge status: Home / Self Care | Payer: Medicare Other | Attending: Emergency Medicine | Admitting: Emergency Medicine

## 2019-07-17 DIAGNOSIS — S0990XA Unspecified injury of head, initial encounter: Secondary | ICD-10-CM | POA: Diagnosis not present

## 2019-07-17 DIAGNOSIS — I10 Essential (primary) hypertension: Secondary | ICD-10-CM | POA: Insufficient documentation

## 2019-07-17 DIAGNOSIS — Z853 Personal history of malignant neoplasm of breast: Secondary | ICD-10-CM | POA: Insufficient documentation

## 2019-07-17 DIAGNOSIS — Y939 Activity, unspecified: Secondary | ICD-10-CM | POA: Diagnosis not present

## 2019-07-17 DIAGNOSIS — G44309 Post-traumatic headache, unspecified, not intractable: Secondary | ICD-10-CM | POA: Diagnosis not present

## 2019-07-17 DIAGNOSIS — E039 Hypothyroidism, unspecified: Secondary | ICD-10-CM | POA: Diagnosis not present

## 2019-07-17 DIAGNOSIS — R52 Pain, unspecified: Secondary | ICD-10-CM | POA: Diagnosis not present

## 2019-07-17 DIAGNOSIS — Z79899 Other long term (current) drug therapy: Secondary | ICD-10-CM | POA: Insufficient documentation

## 2019-07-17 DIAGNOSIS — W19XXXA Unspecified fall, initial encounter: Secondary | ICD-10-CM | POA: Diagnosis not present

## 2019-07-17 DIAGNOSIS — S0083XA Contusion of other part of head, initial encounter: Secondary | ICD-10-CM

## 2019-07-17 DIAGNOSIS — Z87891 Personal history of nicotine dependence: Secondary | ICD-10-CM | POA: Diagnosis not present

## 2019-07-17 DIAGNOSIS — Z743 Need for continuous supervision: Secondary | ICD-10-CM | POA: Diagnosis not present

## 2019-07-17 DIAGNOSIS — W010XXA Fall on same level from slipping, tripping and stumbling without subsequent striking against object, initial encounter: Secondary | ICD-10-CM | POA: Diagnosis not present

## 2019-07-17 DIAGNOSIS — Y999 Unspecified external cause status: Secondary | ICD-10-CM | POA: Diagnosis not present

## 2019-07-17 DIAGNOSIS — Y92481 Parking lot as the place of occurrence of the external cause: Secondary | ICD-10-CM | POA: Insufficient documentation

## 2019-07-17 DIAGNOSIS — R58 Hemorrhage, not elsewhere classified: Secondary | ICD-10-CM | POA: Diagnosis not present

## 2019-07-17 DIAGNOSIS — S0993XA Unspecified injury of face, initial encounter: Secondary | ICD-10-CM | POA: Diagnosis not present

## 2019-07-17 DIAGNOSIS — R609 Edema, unspecified: Secondary | ICD-10-CM | POA: Diagnosis not present

## 2019-07-17 DIAGNOSIS — S01511A Laceration without foreign body of lip, initial encounter: Secondary | ICD-10-CM

## 2019-07-17 MED ORDER — CLINDAMYCIN HCL 300 MG PO CAPS: 300.0000 mg | ORAL_CAPSULE | Freq: Four times a day (QID) | ORAL | 0 refills | Status: DC

## 2019-07-17 MED ORDER — LIDOCAINE HCL (PF) 1 % IJ SOLN
5.0000 mL | Freq: Once | INTRAMUSCULAR | Status: AC
  Administered 2019-07-17: 5 mL via INTRADERMAL
  Filled 2019-07-17: qty 30

## 2019-07-17 MED ORDER — BACITRACIN ZINC 500 UNIT/GM EX OINT
TOPICAL_OINTMENT | CUTANEOUS | Status: AC
  Filled 2019-07-17: qty 0.9

## 2019-07-17 NOTE — Discharge Instructions (Addendum)
Local wound care with bacitracin and dressing changes twice daily.  Clindamycin as prescribed.  Sutures are to be removed in 1 week.  Follow-up with your primary doctor for this.  Return to the emergency department if your symptoms worsen or change.

## 2019-07-17 NOTE — ED Notes (Signed)
Family at bedside. 

## 2019-07-17 NOTE — ED Triage Notes (Signed)
Pt BIBA from [place].   Per EMS-  Pt had mechanical fall, tripped over debris in parking lot. Pt presents with chipped teeth, large knot above right eyebrow, hands have bruises.  Pt reports bracing her fall with hands. Pt denies taking blood thinners, denies LOC.

## 2019-07-17 NOTE — ED Provider Notes (Signed)
[facility] DEPT Provider Note   CSN: [id number] Arrival date & time: 07/17/19  1217     History Chief Complaint  Patient presents with  . Fall  . Lip Laceration    Courtney Castro is a 86 y.o. female.  Patient is an 86 year old female presenting with complaints of fall.  She was walking into a store when she tripped over an object in the parking lot and struck her face on the ground.  She has a laceration to her upper lip, abrasions to her nose, and chipped several of her front teeth.  She denies any loss of consciousness, headache, or neck pain.  The history is provided by the patient.  Fall This is a new problem. The current episode started less than 1 hour ago. The problem occurs constantly. The problem has not changed since onset.Pertinent negatives include no chest pain and no abdominal pain. Nothing aggravates the symptoms. Nothing relieves the symptoms.       Past Medical History:  Diagnosis Date  . Allergy    seasonal allergies  . Basal cell carcinoma 2007, 2015, 2017   face and shoulder (Dr. Derrel Nip)   2017-chin  . BCC (basal cell carcinoma), leg, right 01/28/2018   right thigh-Dr Fontaine No  . Breast cancer ([place]) 1989   R breast-microinvasive papillary(lumpectomy and XRT)  . Colon polyp 09/2000  . Diverticulosis    seen on colonoscopy  . Elevated liver function tests 06/2009   galllstones and dilated CBD--s/p ERCP and chole  . Family history of breast cancer   . Genetic testing 12/20/2016   Multi-Cancer panel (83 genes) @ [facility] - CHEK2 mutation  . Gilbert syndrome   . Hyperlipidemia   . Hypertension   . Hyperthyroidism   . IBS (irritable bowel syndrome) 1984  . Macular degeneration 2008   mild  . Radial head fracture 02/2014   left, nondisplaced  . Subarachnoid hemorrhage ([place]) 02/2014   after fall  . Subdural hematoma ([place]) 06/2008   Dr.Cram  . Subdural hematoma ([place]) 02/2014   after fall (Dr. Vertell Limber)     Patient Active Problem List   Diagnosis Date Noted  . History of breast cancer 05/22/2018  . Genetic testing 12/20/2016  . Family history of breast cancer   . Subarachnoid hemorrhage ([place]) 02/26/2014  . Shoulder pain 05/26/2012  . Impaired fasting glucose 02/19/2012  . Essential hypertension, benign 01/31/2011  . Hypothyroidism 01/31/2011  . Mixed hyperlipidemia 01/31/2011  . Allergic rhinitis 01/31/2011    Past Surgical History:  Procedure Laterality Date  . ABDOMINAL HYSTERECTOMY     bladder repair  . BREAST LUMPECTOMY Right 1989   right breast  . CATARACT EXTRACTION, BILATERAL Bilateral June and August 2020   Dr. Kathrin Penner  . ERCP  07/2009   sphincterotomy  . fracture left foot  01/2007   5th metatarsal-treated with boot  . HAMMER TOE SURGERY Right 05/2004   right foot  . LAPAROSCOPIC CHOLECYSTECTOMY  07/2009  . TOTAL KNEE ARTHROPLASTY Right 2000  . TOTAL KNEE ARTHROPLASTY Left 2004   Dr. Wynelle Link  . TUBAL LIGATION       OB History    Gravida  2   Para  2   Term      Preterm      AB      Living  2     SAB      TAB      Ectopic      Multiple  Live Births              Family History  Problem Relation Age of Onset  . Leukemia Mother        deceased 10; TAH/BSO in 43s  . Coronary artery disease Father   . Hypertension Father   . Heart disease Father        onset late 37's  . Coronary artery disease Brother 60       coronary artery bypass grafting, pacemaker  . Heart disease Brother        CABG in 60's, pacemaker  . Diabetes Paternal Uncle   . Diabetes Paternal Grandmother   . Breast cancer Daughter 60  . Fibroids Daughter   . Breast cancer Maternal Aunt 40       deceased 33s  . Breast cancer Cousin 11       daughter of mat aunt with BC at 97; currently 60  . Cancer Maternal Uncle        dx 70s; unk. type  . Breast cancer Maternal Aunt        dx 52s; deceased 11s    Social History   Tobacco Use  . Smoking status:  Former Smoker    Packs/day: 0.10    Years: 3.00    Pack years: 0.30    Quit date: 03/27/1963    Years since quitting: 56.3  . Smokeless tobacco: Never Used  Substance Use Topics  . Alcohol use: Yes    Alcohol/week: 0.0 standard drinks    Comment: 2-3 glasses of wine/month  . Drug use: No    Home Medications Prior to Admission medications   Medication Sig Start Date End Date Taking? Authorizing Provider  atenolol (TENORMIN) 25 MG tablet TAKE ONE TABLET BY MOUTH DAILY 07/08/19   Rita Ohara, MD  Calcium Carbonate (CALCIUM 500 PO) Take 1 capsule by mouth daily.    [provider]  Cholecalciferol (VITAMIN D) 2000 UNITS tablet Take 2,000 Units by mouth daily.      [provider]  fexofenadine (ALLEGRA) 180 MG tablet Take 180 mg by mouth daily.      [provider]  fluticasone (FLONASE) 50 MCG/ACT nasal spray SPRAY TWO SPRAYS INTO EACH NOSTRIL ONCE DAILY 11/20/18   Rita Ohara, MD  GLUCOSAMINE PO Take 2,000 mg by mouth 2 (two) times daily.      [provider]  levothyroxine (SYNTHROID) 25 MCG tablet Take 1 tablet (25 mcg total) by mouth daily. 11/21/18   Rita Ohara, MD  losartan-hydrochlorothiazide [facility]) 100-25 MG tablet TAKE ONE TABLET BY MOUTH DAILY 07/06/19   Rita Ohara, MD  Multiple Vitamins-Minerals (CENTRUM SILVER PO) Take 1 tablet by mouth daily.      [provider]  Multiple Vitamins-Minerals (PRESERVISION/LUTEIN) CAPS Take 1 capsule by mouth 2 (two) times daily.      [provider]  simvastatin (ZOCOR) 20 MG tablet TAKE ONE TABLET BY MOUTH EVERY NIGHT AT BEDTIME 07/08/19   Rita Ohara, MD    Allergies    Percocet [oxycodone-acetaminophen]  Review of Systems   Review of Systems  Cardiovascular: Negative for chest pain.  Gastrointestinal: Negative for abdominal pain.  All other systems reviewed and are negative.   Physical Exam Updated Vital Signs BP (!) 144/78   Pulse 79   Temp 98.5 F (36.9 C) (Oral)   Resp 17    SpO2 97%   Physical Exam Vitals and nursing note reviewed.  Constitutional:      General: She  is not in acute distress.    Appearance: She is well-developed. She is not diaphoretic.  HENT:     Head: Normocephalic and atraumatic.     Nose:     Comments: There are abrasions to the bridge of the nose.  Septum is midline with no bleeding or hematoma.    Mouth/Throat:     Comments: There is a 1.5 cm laceration to the upper lip.  There are also several chipped front teeth. Eyes:     Extraocular Movements: Extraocular movements intact.     Pupils: Pupils are equal, round, and reactive to light.  Neck:     Comments: There is no cervical spine tenderness or stepoff.  She has painless range of motion in all directions. Cardiovascular:     Rate and Rhythm: Normal rate and regular rhythm.     Heart sounds: No murmur. No friction rub. No gallop.   Pulmonary:     Effort: Pulmonary effort is normal. No respiratory distress.     Breath sounds: Normal breath sounds. No wheezing.  Abdominal:     General: Bowel sounds are normal. There is no distension.     Palpations: Abdomen is soft.     Tenderness: There is no abdominal tenderness.  Musculoskeletal:        General: Normal range of motion.     Cervical back: Normal range of motion and neck supple.  Skin:    General: Skin is warm and dry.  Neurological:     General: No focal deficit present.     Mental Status: She is alert and oriented to person, place, and time.     Cranial Nerves: No cranial nerve deficit.     Motor: No weakness.     ED Results / Procedures / Treatments   Labs (all labs ordered are listed, but only abnormal results are displayed) Labs Reviewed - No data to display  EKG None  Radiology No results found.  Procedures Procedures (including critical care time)  Medications Ordered in ED Medications  lidocaine (PF) (XYLOCAINE) 1 % injection 5 mL (5 mLs Intradermal Given by Other 07/17/19 1245)    ED Course  I  have reviewed the triage vital signs and the nursing notes.  Pertinent labs & imaging results that were available during my care of the patient were reviewed by me and considered in my medical decision making (see chart for details).    MDM Rules/Calculators/A&P    Patient presenting here after a fall.  She has a laceration to her upper lip which was repaired as below.  Patient also underwent imaging studies of her head and facial bones, both of these were unremarkable.  She is neurologically intact and I believe appropriate for discharge.  She is to perform local wound care and stitches to be removed in 1 week.  She is to return as needed if symptoms worsen or change.  LACERATION REPAIR Performed by: Veryl Speak Authorized by: Veryl Speak Consent: Verbal consent obtained. Risks and benefits: risks, benefits and alternatives were discussed Consent given by: patient Patient identity confirmed: provided demographic data Prepped and Draped in normal sterile fashion Wound explored  Laceration Location: upper lip  Laceration Length: 1.5 cm  No Foreign Bodies seen or palpated  Anesthesia: local infiltration  Local anesthetic: lidocaine 1% without epinephrine  Anesthetic total: 2 ml  Irrigation method: syringe Amount of cleaning: standard  Skin closure: 5-0 prolene  Number of sutures: 3  Technique: simple interrupted  Patient tolerance: Patient tolerated  the procedure well with no immediate complications.   Final Clinical Impression(s) / ED Diagnoses Final diagnoses:  None    Rx / DC Orders ED Discharge Orders    None       Veryl Speak, MD 07/17/19 [phone]

## 2019-07-24 ENCOUNTER — Encounter: Payer: Self-pay | Admitting: Family Medicine

## 2019-07-24 ENCOUNTER — Other Ambulatory Visit: Payer: Self-pay

## 2019-07-24 ENCOUNTER — Ambulatory Visit (INDEPENDENT_AMBULATORY_CARE_PROVIDER_SITE_OTHER): Payer: Medicare Other | Admitting: Family Medicine

## 2019-07-24 VITALS — BP 148/84 | HR 79 | Temp 96.8°F | Wt 135.2 lb

## 2019-07-24 DIAGNOSIS — S01511A Laceration without foreign body of lip, initial encounter: Secondary | ICD-10-CM

## 2019-07-24 NOTE — Progress Notes (Signed)
   Subjective:    Patient ID: Courtney Castro, female    DOB: 04-04-1933, 86 y.o.   MRN: [id number]  HPI She is here for suture removal.  She fell on April 23 sustaining a laceration to her mid upper lip area as well as dental damage.  She has been seen by her dentist for the tooth disruption.  She did have 3 sutures applied.   Review of Systems     Objective:   Physical Exam Alert and in no distress.  Upper lip is swollen with 3 sutures noted.      Assessment & Plan:  Lip laceration, initial encounter The 3 sutures were removed without difficulty.  She will continue to follow-up with her dentist.

## 2019-08-03 DIAGNOSIS — K580 Irritable bowel syndrome with diarrhea: Secondary | ICD-10-CM | POA: Diagnosis not present

## 2019-08-21 DIAGNOSIS — K580 Irritable bowel syndrome with diarrhea: Secondary | ICD-10-CM | POA: Diagnosis not present

## 2019-08-26 LAB — FECAL OCCULT BLOOD, GUAIAC: Fecal Occult Blood: NEGATIVE

## 2019-08-27 ENCOUNTER — Encounter: Payer: Self-pay | Admitting: *Deleted

## 2019-09-01 ENCOUNTER — Encounter: Payer: Self-pay | Admitting: Family Medicine

## 2019-09-10 DIAGNOSIS — D3132 Benign neoplasm of left choroid: Secondary | ICD-10-CM | POA: Diagnosis not present

## 2019-09-10 DIAGNOSIS — Z961 Presence of intraocular lens: Secondary | ICD-10-CM | POA: Diagnosis not present

## 2019-09-10 DIAGNOSIS — H353221 Exudative age-related macular degeneration, left eye, with active choroidal neovascularization: Secondary | ICD-10-CM | POA: Diagnosis not present

## 2019-09-10 DIAGNOSIS — H353112 Nonexudative age-related macular degeneration, right eye, intermediate dry stage: Secondary | ICD-10-CM | POA: Diagnosis not present

## 2019-09-10 DIAGNOSIS — H18593 Other hereditary corneal dystrophies, bilateral: Secondary | ICD-10-CM | POA: Diagnosis not present

## 2019-09-24 DIAGNOSIS — Z961 Presence of intraocular lens: Secondary | ICD-10-CM | POA: Diagnosis not present

## 2019-09-24 DIAGNOSIS — H353112 Nonexudative age-related macular degeneration, right eye, intermediate dry stage: Secondary | ICD-10-CM | POA: Diagnosis not present

## 2019-09-24 DIAGNOSIS — H524 Presbyopia: Secondary | ICD-10-CM | POA: Diagnosis not present

## 2019-09-24 DIAGNOSIS — H353221 Exudative age-related macular degeneration, left eye, with active choroidal neovascularization: Secondary | ICD-10-CM | POA: Diagnosis not present

## 2019-10-06 ENCOUNTER — Other Ambulatory Visit: Payer: Self-pay | Admitting: Family Medicine

## 2019-10-06 DIAGNOSIS — E782 Mixed hyperlipidemia: Secondary | ICD-10-CM

## 2019-11-12 ENCOUNTER — Other Ambulatory Visit: Payer: Self-pay | Admitting: Family Medicine

## 2019-11-12 DIAGNOSIS — E039 Hypothyroidism, unspecified: Secondary | ICD-10-CM

## 2019-12-02 NOTE — Progress Notes (Signed)
Chief Complaint  Patient presents with  . Hypertension    fasting med check. Had stool test at home for [facility] and it was negative.      Patient presents for med check.  She had fall with lip laceration and dental injury in April. She just got her bridge last week.  She hasn't had any further falls.  She is doing the exercises with her husband (he is getting PT for falls).  HTN: She hasn't been checking her blood pressure at home.  Denies dizziness or headaches, chest pain, muscle cramps, edema. Compliant with medications--losartan HCT 100-25 and atenolol 87m. She denies side effects.   Hyperlipidemia follow-up: Patient is reportedly following a low-fat, low cholesterol diet. Compliant with medications (simvastatin 28m and denies medication side effects.  Lipids were at goal on last check Lab Results  Component Value Date   CHOL 167 05/28/2019   HDL 69 05/28/2019   LDLCALC 81 05/28/2019   TRIG 93 05/28/2019   CHOLHDL 2.4 05/28/2019   Hypothyroidism:She denies skin/hair/nail changes, bowel changes, energy is good, weight is stable. Compliant with taking her medication, taking it on an empty stomach (30 minutes prior to eating). She separates the thyroid medication from her calcium and vitamins by at least 2 hours (takes at lunch). She is due for recheck (last TSH was 1 year ago)  H/oImpaired fasting glucose:Shelimits her sweets/sugar. Has small amounts ofchocolate, cookies, ice cream(1-2x/week)--small portions, makes itlast a long time. A1c's have been normal (so stopped checking).  She had fasting glucose of 103 in 04/2018, 91 in 10/2018.Tries to get regular exercise.  History of R breast cancer, s/p lumpectomy and radiation. She last had mammo in 03/2019. Biopsy in 2018showed fat necrosis with dense fibrosis (benign). 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 MaWatt Climesn May, who only recommended checking  her stool for blood, which was negative.  H/o IBS: Sometimes she has a slightly loose bowel movement after certain foods (sugar-free chocolates), along with some gassiness.  She currently denies any abdominal pain.  She cut back on Fiber 1 cereal (eating smaller volume), and her bowels have been much better. No black or bloody stools, no mucus in the stool.  Allergies: year-round, controlled with flonase and allegra daily.  Macular degeneration--she has been getting injections into the L eye by Dr. GrCordelia Penevery 3 months, and vision has been stable.   PMH, PSH, SH reviewed  Outpatient Encounter Medications as of 12/03/2019  Medication Sig Note  . atenolol (TENORMIN) 25 MG tablet TAKE ONE TABLET BY MOUTH DAILY   . Calcium Carbonate (CALCIUM 500 PO) Take 1 capsule by mouth daily.   . fexofenadine (ALLEGRA) 180 MG tablet Take 180 mg by mouth daily.     . fluticasone (FLONASE) 50 MCG/ACT nasal spray SPRAY TWO SPRAYS INTO EACH NOSTRIL ONCE DAILY   . GLUCOSAMINE PO Take 2,000 mg by mouth 2 (two) times daily.   12/03/2019: Has 2,000iu vitamin d  . levothyroxine (SYNTHROID) 25 MCG tablet TAKE ONE TABLET BY MOUTH DAILY   . losartan-hydrochlorothiazide (HYZAAR) 100-25 MG tablet TAKE ONE TABLET BY MOUTH DAILY   . Multiple Vitamins-Minerals (CENTRUM SILVER PO) Take 1 tablet by mouth daily.     . Multiple Vitamins-Minerals (PRESERVISION/LUTEIN) CAPS Take 1 capsule by mouth 2 (two) times daily.     . simvastatin (ZOCOR) 20 MG tablet TAKE ONE TABLET BY MOUTH EVERY NIGHT AT BEDTIME   . [DISCONTINUED] Cholecalciferol (VITAMIN D) 2000 UNITS  tablet Take 2,000 Units by mouth daily.     . [DISCONTINUED] clindamycin (CLEOCIN) 300 MG capsule Take 1 capsule (300 mg total) by mouth 4 (four) times daily. X 7 days (Patient not taking: Reported on 07/24/2019)    No facility-administered encounter medications on file as of 12/03/2019.   She stopped the separate vitamin D because her glucosamine changed to one that  included 2000 IU daily.  Allergies  Allergen Reactions  . Percocet [Oxycodone-Acetaminophen] Other (See Comments)    Upset stomach    ROS:  No fever, chills, URI symptoms, chest pain, headaches, dizziness, syncope, GI or GU complaints.  No edema. No urinary complaints. No joint pains No bleeding, bruising, rash No joint pains. Moods are good.   PHYSICAL EXAM:  BP 132/80   Pulse 72   Ht '5\' 4"'  (1.626 m)   Wt 132 lb 9.6 oz (60.1 kg)   BMI 22.76 kg/m   Wt Readings from Last 3 Encounters:  12/03/19 132 lb 9.6 oz (60.1 kg)  07/24/19 135 lb 3.2 oz (61.3 kg)  05/28/19 134 lb 6.4 oz (61 kg)   Well developed, talkative female in no distress HEENT: PERRL, EOMI, conjunctiva clear. Wearing mask. Neck: no lymphadenopathy, thyromegaly or carotid bruit. Heart: regular rate and rhythm, no murmur Lungs: clear bilaterally Abdomen: soft, nontender, no organomegaly or mass Back: no CVA or spinal tenderness Extremities: No edema, 2+ pulses Skin:normal turgor, no rashes, lesions Psych: normal mood, affect, hygiene and grooming. Neuro: alert and oriented.Normal strength, gait.  ASSESSMENT/PLAN:  Essential hypertension, benign - controlled. Cont current meds, low sodium diet, regular exercise - Plan: Comprehensive metabolic panel  Hypothyroidism, unspecified type - euthyroid by history, due for TSH - Plan: TSH  Mixed hyperlipidemia - controlled per last check; cont statin and lowfat, low cholesterol diet  Medication monitoring encounter - Plan: Comprehensive metabolic panel, TSH  Need for influenza vaccination - Plan: Flu Vaccine QUAD High Dose(Fluad)  TSH, c-met (lipids at goal, not needed)  COVID booster after 10/11, unless recommendations change    Pt states rx's not needed today (all good for at least another month)

## 2019-12-03 ENCOUNTER — Ambulatory Visit (INDEPENDENT_AMBULATORY_CARE_PROVIDER_SITE_OTHER): Payer: Medicare Other | Admitting: Family Medicine

## 2019-12-03 ENCOUNTER — Encounter: Payer: Self-pay | Admitting: Family Medicine

## 2019-12-03 VITALS — BP 132/80 | HR 72 | Ht 64.0 in | Wt 132.6 lb

## 2019-12-03 DIAGNOSIS — E782 Mixed hyperlipidemia: Secondary | ICD-10-CM

## 2019-12-03 DIAGNOSIS — Z23 Encounter for immunization: Secondary | ICD-10-CM

## 2019-12-03 DIAGNOSIS — Z5181 Encounter for therapeutic drug level monitoring: Secondary | ICD-10-CM

## 2019-12-03 DIAGNOSIS — E039 Hypothyroidism, unspecified: Secondary | ICD-10-CM

## 2019-12-03 DIAGNOSIS — I10 Essential (primary) hypertension: Secondary | ICD-10-CM

## 2019-12-03 NOTE — Patient Instructions (Signed)
Continue your current medications. Keep up the good work!

## 2019-12-04 LAB — COMPREHENSIVE METABOLIC PANEL
ALT: 18 IU/L (ref 0–32)
AST: 29 IU/L (ref 0–40)
Albumin/Globulin Ratio: 3.1 — ABNORMAL HIGH (ref 1.2–2.2)
Albumin: 4.9 g/dL — ABNORMAL HIGH (ref 3.6–4.6)
Alkaline Phosphatase: 112 IU/L (ref 48–121)
BUN/Creatinine Ratio: 25 (ref 12–28)
BUN: 17 mg/dL (ref 8–27)
Bilirubin Total: 1.6 mg/dL — ABNORMAL HIGH (ref 0.0–1.2)
CO2: 28 mmol/L (ref 20–29)
Calcium: 10.4 mg/dL — ABNORMAL HIGH (ref 8.7–10.3)
Chloride: 97 mmol/L (ref 96–106)
Creatinine, Ser: 0.69 mg/dL (ref 0.57–1.00)
GFR calc Af Amer: 92 mL/min/{1.73_m2} (ref >59)
GFR calc non Af Amer: 80 mL/min/{1.73_m2} (ref >59)
Globulin, Total: 1.6 g/dL (ref 1.5–4.5)
Glucose: 96 mg/dL (ref 65–99)
Potassium: 4 mmol/L (ref 3.5–5.2)
Sodium: 137 mmol/L (ref 134–144)
Total Protein: 6.5 g/dL (ref 6.0–8.5)

## 2019-12-04 LAB — TSH: TSH: 1.41 u[IU]/mL (ref 0.450–4.500)

## 2019-12-29 ENCOUNTER — Ambulatory Visit: Payer: Medicare Other

## 2020-01-03 ENCOUNTER — Other Ambulatory Visit: Payer: Self-pay | Admitting: Family Medicine

## 2020-01-03 DIAGNOSIS — I10 Essential (primary) hypertension: Secondary | ICD-10-CM

## 2020-01-05 ENCOUNTER — Ambulatory Visit (INDEPENDENT_AMBULATORY_CARE_PROVIDER_SITE_OTHER): Payer: Medicare Other

## 2020-01-05 ENCOUNTER — Other Ambulatory Visit: Payer: Self-pay

## 2020-01-05 DIAGNOSIS — Z23 Encounter for immunization: Secondary | ICD-10-CM | POA: Diagnosis not present

## 2020-01-14 ENCOUNTER — Other Ambulatory Visit: Payer: Self-pay | Admitting: Family Medicine

## 2020-01-14 DIAGNOSIS — I10 Essential (primary) hypertension: Secondary | ICD-10-CM

## 2020-01-14 DIAGNOSIS — D3132 Benign neoplasm of left choroid: Secondary | ICD-10-CM | POA: Diagnosis not present

## 2020-01-14 DIAGNOSIS — H353112 Nonexudative age-related macular degeneration, right eye, intermediate dry stage: Secondary | ICD-10-CM | POA: Diagnosis not present

## 2020-01-14 DIAGNOSIS — H353221 Exudative age-related macular degeneration, left eye, with active choroidal neovascularization: Secondary | ICD-10-CM | POA: Diagnosis not present

## 2020-01-14 DIAGNOSIS — Z961 Presence of intraocular lens: Secondary | ICD-10-CM | POA: Diagnosis not present

## 2020-02-12 DIAGNOSIS — L720 Epidermal cyst: Secondary | ICD-10-CM | POA: Diagnosis not present

## 2020-02-12 DIAGNOSIS — L570 Actinic keratosis: Secondary | ICD-10-CM | POA: Diagnosis not present

## 2020-02-12 DIAGNOSIS — D1801 Hemangioma of skin and subcutaneous tissue: Secondary | ICD-10-CM | POA: Diagnosis not present

## 2020-02-12 DIAGNOSIS — Z85828 Personal history of other malignant neoplasm of skin: Secondary | ICD-10-CM | POA: Diagnosis not present

## 2020-02-12 DIAGNOSIS — D2262 Melanocytic nevi of left upper limb, including shoulder: Secondary | ICD-10-CM | POA: Diagnosis not present

## 2020-02-15 ENCOUNTER — Other Ambulatory Visit: Payer: Self-pay | Admitting: Family Medicine

## 2020-02-15 DIAGNOSIS — E039 Hypothyroidism, unspecified: Secondary | ICD-10-CM

## 2020-02-24 ENCOUNTER — Other Ambulatory Visit: Payer: Self-pay | Admitting: Family Medicine

## 2020-02-24 DIAGNOSIS — Z1231 Encounter for screening mammogram for malignant neoplasm of breast: Secondary | ICD-10-CM

## 2020-03-21 ENCOUNTER — Other Ambulatory Visit: Payer: Self-pay | Admitting: Family Medicine

## 2020-03-21 DIAGNOSIS — J309 Allergic rhinitis, unspecified: Secondary | ICD-10-CM

## 2020-03-22 ENCOUNTER — Telehealth: Payer: Self-pay | Admitting: Family Medicine

## 2020-03-22 NOTE — Telephone Encounter (Signed)
Patient called, her son in law tested positive for Covid today. She has not been around him but she has been around her daughter, Courtney Castro. Courtney Castro is being tested today. Laiya is not having any symptoms. Checked with Dr. Susann Givens and he advised she wait 3 days to be tested since she is not having any symptoms. Did advise her to call us back if she develops symptoms

## 2020-04-07 ENCOUNTER — Ambulatory Visit
Admission: RE | Admit: 2020-04-07 | Discharge: 2020-04-07 | Disposition: A | Discharge status: Home / Self Care | Payer: Medicare Other | Source: Ambulatory Visit | Attending: Family Medicine | Admitting: Family Medicine

## 2020-04-07 ENCOUNTER — Other Ambulatory Visit: Payer: Self-pay

## 2020-04-07 DIAGNOSIS — Z1231 Encounter for screening mammogram for malignant neoplasm of breast: Secondary | ICD-10-CM

## 2020-04-20 ENCOUNTER — Telehealth: Payer: Self-pay | Admitting: Family Medicine

## 2020-04-20 NOTE — Telephone Encounter (Signed)
Patient doing virtual tomorrow here.

## 2020-04-20 NOTE — Telephone Encounter (Signed)
Patient called Her husband was admitted to [facility] on Friday from [place] He tested positive for Covid either Friday or Saturday  She was last around him on Friday  Patient states she is not having any symptoms but she has allergies and always has nasal drainage etc. She does not feel bad at all She is questioning when/if she needs to get tested.  She is concerned about needing to be tested before she can go see him, I did advise her that they would not let her see him if he is positive  Please call

## 2020-04-20 NOTE — Telephone Encounter (Signed)
Probably a good idea to get tested (she is at day 5 now), given her exposure and to feel more confident that any symptoms are from her usual allergies and not COVID (in order to properly isolate if positive).  I agree with Melissa--she likely won't be allowed to visit. She can test anywhere (through [facility]'s testing sites, [facility]); if testing here, needs virtual, especially if she has a lot of questions. Let her know that I did see all of his info regarding the hospitalization.

## 2020-04-21 ENCOUNTER — Encounter: Payer: Self-pay | Admitting: Family Medicine

## 2020-04-21 ENCOUNTER — Other Ambulatory Visit (INDEPENDENT_AMBULATORY_CARE_PROVIDER_SITE_OTHER): Payer: Medicare Other

## 2020-04-21 ENCOUNTER — Telehealth (INDEPENDENT_AMBULATORY_CARE_PROVIDER_SITE_OTHER): Payer: Medicare Other | Admitting: Family Medicine

## 2020-04-21 VITALS — BP 134/70 | HR 68 | Temp 98.2°F | Ht 64.5 in | Wt 130.0 lb

## 2020-04-21 DIAGNOSIS — Z1152 Encounter for screening for COVID-19: Secondary | ICD-10-CM | POA: Diagnosis not present

## 2020-04-21 DIAGNOSIS — Z20822 Contact with and (suspected) exposure to covid-19: Secondary | ICD-10-CM | POA: Diagnosis not present

## 2020-04-21 LAB — POC COVID19 BINAXNOW: SARS Coronavirus 2 Ag: NEGATIVE

## 2020-04-21 NOTE — Progress Notes (Signed)
Start time: 2:23 End time: 2:37   Virtual Visit via Telephone Note  I connected with Courtney Castro on 04/21/20 by telephone and verified that I am speaking with the correct person using two identifiers.  Location: Patient: home Provider: office   I discussed the limitations of evaluation and management by telemedicine and the availability of in person appointments. The patient expressed understanding and agreed to proceed.  History of Present Illness:  Chief Complaint  Patient presents with  . Covid Exposure    VIRTUAL had covid exposure 04/15/20, has only PND but has allergies and typically has this symptom. Would like to have covid test to make sure she is not covid positive.   Husband tested positive upon admission to the hospital on 1/21 (sepsis--doing better). She last saw him Friday afternoon (day of admission) She has chronic allergies, bothering her for the last month. She denies any change in symptoms in the last week.  She has PND.  She uses flonase at night (forgot last night, so is a little more today) Mucus is clear.  PMH, PSH, SH reviewed  Outpatient Encounter Medications as of 04/21/2020  Medication Sig Note  . atenolol (TENORMIN) 25 MG tablet TAKE ONE TABLET BY MOUTH DAILY   . fexofenadine (ALLEGRA) 180 MG tablet Take 180 mg by mouth daily.   . fluticasone (FLONASE) 50 MCG/ACT nasal spray SPRAY TWO SPRAYS IN EACH NOSTRIL DAILY   . GLUCOSAMINE PO Take 2,000 mg by mouth 2 (two) times daily. 12/03/2019: Has 2,000iu vitamin d  . levothyroxine (SYNTHROID) 25 MCG tablet TAKE ONE TABLET BY MOUTH DAILY   . losartan-hydrochlorothiazide (HYZAAR) 100-25 MG tablet TAKE ONE TABLET BY MOUTH DAILY   . Multiple Vitamins-Minerals (CENTRUM SILVER PO) Take 1 tablet by mouth daily.   . Multiple Vitamins-Minerals (PRESERVISION/LUTEIN) CAPS Take 1 capsule by mouth 2 (two) times daily.   . simvastatin (ZOCOR) 20 MG tablet TAKE ONE TABLET BY MOUTH EVERY NIGHT AT BEDTIME   .  [DISCONTINUED] Calcium Carbonate (CALCIUM 500 PO) Take 1 capsule by mouth daily.    No facility-administered encounter medications on file as of 04/21/2020.   Allergies  Allergen Reactions  . Percocet [Oxycodone-Acetaminophen] Other (See Comments)    Upset stomach   ROS:  Denies fever. No headache, no loss of smell/taste No nausea, vomiting, diarrhea or abdominal pain. Allergies and PND per HPI.  No coughing, no chest pain or shortness of breath.    Observations/Objective:  BP 134/70   Pulse 68   Temp 98.2 F (36.8 C) (Oral)   Ht 5' 4.5" (1.638 m)   Wt 130 lb (59 kg)   BMI 21.97 kg/m   She is alert, oriented. No coughing, doesn't sound different than her usual. Exam is limited due to virtual nature of visit.  Rapid COVID test negative  Assessment and Plan:  Exposure to COVID-19 virus - last contact 5d ago.  Asymptomatic (just chronic allergies).  rapid test negative, PCR sent/pending.  Reviewed recommendations if +  Encounter for screening for COVID-19 - Plan: POC COVID-19, Novel Coronavirus, NAA ([facility])  LMOM home phone number with results.  Discussed recommendations if positive, discussed quarantine (what she has been doing since exposure) vs isolation if tests +.  All questions were answered.   Follow Up Instructions:    I discussed the assessment and treatment plan with the patient. The patient was provided an opportunity to ask questions and all were answered. The patient agreed with the plan and demonstrated an understanding of the  instructions.   The patient was advised to call back or seek an in-person evaluation if the symptoms worsen or if the condition fails to improve as anticipated.  I spent 16 minutes dedicated to the care of this patient, including pre-visit review of records, face to face time, post-visit ordering of testing and documentation.   Vikki Ports, MD

## 2020-04-23 LAB — NOVEL CORONAVIRUS, NAA: SARS-CoV-2, NAA: NOT DETECTED

## 2020-04-23 LAB — SARS-COV-2, NAA 2 DAY TAT

## 2020-04-28 DIAGNOSIS — Z961 Presence of intraocular lens: Secondary | ICD-10-CM | POA: Diagnosis not present

## 2020-04-28 DIAGNOSIS — D3132 Benign neoplasm of left choroid: Secondary | ICD-10-CM | POA: Diagnosis not present

## 2020-04-28 DIAGNOSIS — H353221 Exudative age-related macular degeneration, left eye, with active choroidal neovascularization: Secondary | ICD-10-CM | POA: Diagnosis not present

## 2020-04-28 DIAGNOSIS — H353112 Nonexudative age-related macular degeneration, right eye, intermediate dry stage: Secondary | ICD-10-CM | POA: Diagnosis not present

## 2020-05-10 ENCOUNTER — Other Ambulatory Visit: Payer: Self-pay | Admitting: Family Medicine

## 2020-05-10 DIAGNOSIS — E039 Hypothyroidism, unspecified: Secondary | ICD-10-CM

## 2020-05-10 NOTE — Telephone Encounter (Signed)
Lvm to confirm if pt needs med before appt. 05/30/20 [place]

## 2020-05-11 ENCOUNTER — Other Ambulatory Visit: Payer: Self-pay

## 2020-05-29 NOTE — Progress Notes (Signed)
Chief Complaint  Patient presents with  . Medicare Wellness    Fasting AWV/CPE with pelvic. Allergies are really bothering her. Some white mucus to yellow at times.     Courtney Castro is a 87 y.o. female who presents for annual physical exam, Medicare wellness visit and follow-up on chronic medical conditions.    Her husband, Courtney Castro, recently passed away.  She is in the process of downsizing--selling her house and moving to a townhome a short distance away.  She started seeing a counselor, Courtney Castro (whom her family also sees), which has been helpful.  HTN:She hasn't been checking her blood pressureat home. Denies dizziness or headaches, chest pain, muscle cramps, edema. Compliant with medications--losartan HCT 100-25 and atenolol 54m. She denies side effects.   Hyperlipidemia follow-up: Patient is reportedly following a low-fat, low cholesterol diet. Compliant with medications(simvastatin 283mand denies medication side effects. Lipids were at goal on last check. Due for recheck. Lab Results  Component Value Date   CHOL 167 05/28/2019   HDL 69 05/28/2019   LDLCALC 81 05/28/2019   TRIG 93 05/28/2019   CHOLHDL 2.4 05/28/2019   Hypothyroidism:Shedeniesskin/hair/nailchanges, bowel changes, energy is good, weight is stable (lost some--eating smaller meals). Compliant with taking her medication, taking it on an empty stomach (30 minutes prior to eating). She stopped taking Caltrate after 05/2019 labs (Ca was a little high).  Lab Results  Component Value Date   TSH 1.410 12/03/2019   H/oImpaired fasting glucose:Shelimits her sweets/sugar. Has small amounts ofchocolate, cookies (sugar-free), ice cream(1-2x/week)--small portions, makes itlast a long time.  A1c's have been normal(so stopped checking). The last 2 sugars were normal (in March and September 2021). Tries to get regular exercise.  History of R breast cancer, s/p lumpectomy and radiation. She last had  mammo in1/2022. Biopsy in 2018showed fat necrosis with dense fibrosis (benign). 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 MaWatt Climesn May, 2021 who only recommended checking her stool for blood, which was negative.  Allergies: year-round, controlled with flonase and allegra daily.Flared recently, was coughing up white phlegm.  Only intermittently sees anything discolored (slightly yellow first thing in the morning from her nose).  Currently doing well, notices it more when she is outside.  Macular degeneration--she has been getting injections into the L eye by Dr. GrCordelia Penevery 3-4 months, and vision has been stable.  Immunization History  Administered Date(s) Administered  . Fluad Quad(high Dose 65+) 11/20/2018, 12/03/2019  . Influenza Split 12/11/2009, 01/02/2011, 12/21/2011  . Influenza, High Dose Seasonal PF 12/05/2012, 12/17/2013, 12/13/2014, 12/22/2015, 01/01/2017, 12/04/2017  . PFIZER(Purple Top)SARS-COV-2 Vaccination 04/17/2019, 05/07/2019, 01/05/2020  . Pneumococcal Conjugate-13 03/04/2014  . Pneumococcal Polysaccharide-23 11/07/2001, 01/31/2011  . Td 12/18/2004  . Tdap 01/31/2011, 03/22/2017  . Zoster 08/25/2007  . Zoster Recombinat (Shingrix) 07/10/2016, 10/18/2016   Last Pap smear: 2011, s/p hysterectomy for benign reasons  Last mammogram: 03/2020 Last colonoscopy: June 2014 with Dr. MaWatt ClimesNegative FOB 08/2019 (Dr. MaWatt ClimesLast DEXA:05/2016 normal Dentist: twice yearly  Ophtho: yearly; sees Dr. GrCordelia Penore often for injections Exercise:Walks 20 minutes when the weather is nice. Not currently getting weight-bearing exercise.  Other doctors caring for patient include: Ophtho: Dr. GrCordelia [facility]retinal specialist[facility] Pen[facility]ist: Dr. MiIrineo AxonI: Dr. MaWatt Climeserm: Dr. StDerrel Nipodiatry: Dr. AjFritzi Mandesno longer sees) Ortho: Dr. AlWynelle LinkDr. DaRhona Raidereurosurg: Dr. KrWeston SettleDr.  StVertell Limber Depression screen: negative Fall screen--  One, 06/2019--lip laceration and dental injury Functional Status  survey notable for rare urge incontinence, very slight memory change Mini-Cog screen normal See full screens in epic  End of Life Discussion: Has living will and healthcare power of attorney forms. These are from 2007, and scanned into chart in 12/2019.  Husband was listed, but daughters also listed as [place] POA. She plans to meet with her attorney.    PMH, PSH, SH and FH were reviewed and updated  Outpatient Encounter Medications as of 05/30/2020  Medication Sig Note  . atenolol (TENORMIN) 25 MG tablet TAKE ONE TABLET BY MOUTH DAILY   . fexofenadine (ALLEGRA) 180 MG tablet Take 180 mg by mouth daily.   . fluticasone (FLONASE) 50 MCG/ACT nasal spray SPRAY TWO SPRAYS IN EACH NOSTRIL DAILY   . GLUCOSAMINE PO Take 2,000 mg by mouth 2 (two) times daily. 12/03/2019: Has 2,000iu vitamin d  . levothyroxine (SYNTHROID) 25 MCG tablet TAKE ONE TABLET BY MOUTH DAILY   . losartan-hydrochlorothiazide (HYZAAR) 100-25 MG tablet TAKE ONE TABLET BY MOUTH DAILY   . Multiple Vitamins-Minerals (CENTRUM SILVER PO) Take 1 tablet by mouth daily.   . Multiple Vitamins-Minerals (PRESERVISION/LUTEIN) CAPS Take 1 capsule by mouth 2 (two) times daily.   . simvastatin (ZOCOR) 20 MG tablet TAKE ONE TABLET BY MOUTH EVERY NIGHT AT BEDTIME    No facility-administered encounter medications on file as of 05/30/2020.   Allergies  Allergen Reactions  . Percocet [Oxycodone-Acetaminophen] Other (See Comments)    Upset stomach    ROS: The patient denies anorexia, fever, weight changes, headaches, vision changes, decreased hearing, ear pain, sore throat, breast concerns, chest pain, palpitations, dizziness, syncope, dyspnea on exertion, swelling, nausea, vomiting, diarrhea, constipation, abdominal pain, melena, hematochezia, indigestion/heartburn, hematuria, vaginal bleeding, genital lesions, joint pains,  numbness, tingling, weakness, tremor, suspicious skin lesions, depression, anxiety, abnormal bleeding/bruising, or enlarged lymph nodes.  She denies any urinary complaints, just rare urge incontinence. Up 1-2 times/night to void (twice if she ate dinner late). Allergies are controlled currently, periodically has white mucus/phlegm. Denies joint pains. Has been a little tired, intermittently, thinks related to when she stays up too late.   PHYSICAL EXAM:  BP 110/70   Pulse 60   Ht _0  (1.626 m)   Wt 128 lb 6.4 oz (58.2 kg)   BMI 22.04 kg/m   Wt Readings from Last 3 Encounters:  05/30/20 128 lb 6.4 oz (58.2 kg)  04/21/20 130 lb (59 kg)  12/03/19 132 lb 9.6 oz (60.1 kg)    General Appearance:  Alert, cooperative, no distress, appearsyounger thanstated age   Head:  Normocephalic, atraumatic  Eyes:  PERRL, conjunctiva/corneas clear, EOM's intact, fundi ok--no hemorrhages, exudates or cataracts  Ears:  Normal TM's and external ear canals   Nose:  Not examined, wearing mask due to COVID-19 pandemic  Throat:  Not examined, wearing mask due to COVID-19 pandemic  Neck:  Supple, no lymphadenopathy; thyroid: no enlargement/ tenderness/nodules; no carotid bruit or JVD   Back:  Spine nontender, no curvature, ROM normal, no CVA tenderness.   Lungs:  Clear to auscultation bilaterally without wheezes, rales or ronchi; respirations unlabored   Chest Wall:  No tenderness or deformity   Heart:  Regular rate and rhythm, S1 and S2 normal, no murmur, rub or gallop   Breast Exam:  No tenderness, masses, or nipple discharge or inversion. No axillary lymphadenopathy. WHSS R breast at 9 o'clock and in the right axilla. Nontender.  Some thickening at scars, unchanged. Left breast larger than right.Many SK's and some skin tags.  Abdomen:  Soft, non-tender, nondistended, normoactive bowel sounds,  no masses, no hepatosplenomegaly   Genitalia:  Normal  external genitalia without lesions,+atrophic changes. BUS and vagina normal; Adnexa not palpable. Uterus surgically absent. No masses or tenderness. Pap not performed   Rectal:  Normal tone, no masses or tenderness; guaiac negative stool   Extremities:  No clubbing, cyanosis or edema.  Pulses:  2+ and symmetric all extremities   Skin:  Skin color, texture, turgor normal, no rashes or lesions. Many seborrheic keratosis throughout, especially on chest/abdomen.   Lymph nodes:  Cervical, supraclavicular, and axillary nodes normal   Neurologic:  Normal strength, sensation and gait; reflexes 2+ and symmetric throughout    Psych: Normal mood, affect, hygiene and grooming.   ASSESSMENT/PLAN:  Annual physical exam - Plan: Fecal occult blood, imunochemical([facility])  Medicare annual wellness visit, subsequent  Hypothyroidism, unspecified type - continue current dose of medication. Recheck TSH in 6 months  Mixed hyperlipidemia - cont statin, due for recheck - Plan: Lipid panel, simvastatin (ZOCOR) 20 MG tablet  Allergic rhinitis, unspecified seasonality, unspecified trigger - controlled on current regimen. May use mucinex prn if thicker mucus/phlegm develops  History of breast cancer - mammo UTD, normal breast exam  Medication monitoring encounter - Plan: Comprehensive metabolic panel, CBC with Differential/Platelet, Lipid panel  Need for pneumococcal vaccination - Plan: Pneumococcal polysaccharide vaccine 23-valent greater than or equal to 2yo subcutaneous/IM  Essential hypertension, benign - well controlled. Discussed recalls of losartan, and if not available, may need to changed med (and monitor BP, sched f/u) - Plan: Comprehensive metabolic panel, atenolol (TENORMIN) 25 MG tablet, losartan-hydrochlorothiazide (HYZAAR) 100-25 MG tablet  Encouraged continued counseling. Discussed calcium recommendations in detail (may still need supplement,  depending on dietary intake, and timing of Ca in relation to her synthroid).   Discussed monthly self breast exams and yearly mammograms; at least 30 minutes of aerobic activity at least 5 days/week, weight-bearing exercise 2/week; proper sunscreen use reviewed; healthy diet, including goals of calcium and vitamin D intake and alcohol recommendations (less than or equal to 1 drink/day) reviewed; regular seatbelt use; changing batteries in smoke detectors. Immunization recommendations discussed--continueyearlyhigh doseflu shots. Pneumovax booster given today.Colon cancer screening:  Should be q5 yr due to CHEK2 mutation, but due to age, Dr. Watt Climes recommended hemoccult rather than colonoscopy. FIT test given to patient today.  Discussed losartan HCT recalls, possibly needing to change to valsartan HCT (with a blood pressure check after the change).  Given new forms in case she wants to update [facility] POA (since her husband passed away).  She is Full Code, Full Care. MOSTform reviewed/signed  F/u 6 months for med check, sooner prn.    Medicare Attestation I have personally reviewed: The patient's medical and social history Their use of alcohol, tobacco or illicit drugs Their current medications and supplements The patient's functional ability including ADLs,fall risks, home safety risks, cognitive, and hearing and visual impairment Diet and physical activities Evidence for depression or mood disorders  The patient's weight, height, BMI have been recorded in the chart.  I have made referrals, counseling, and provided education to the patient based on review of the above and I have provided the patient with a written personalized care plan for preventive services.

## 2020-05-29 NOTE — Patient Instructions (Addendum)
HEALTH MAINTENANCE RECOMMENDATIONS:  It is recommended that you get at least 30 minutes of aerobic exercise at least 5 days/week (for weight loss, you may need as much as 60-90 minutes). This can be any activity that gets your heart rate up. This can be divided in 10-15 minute intervals if needed, but try and build up your endurance at least once a week.  Weight bearing exercise is also recommended twice weekly.  Eat a healthy diet with lots of vegetables, fruits and fiber.  "Colorful" foods have a lot of vitamins (ie green vegetables, tomatoes, red peppers, etc).  Limit sweet tea, regular sodas and alcoholic beverages, all of which has a lot of calories and sugar.  Up to 1 alcoholic drink daily may be beneficial for women (unless trying to lose weight, watch sugars).  Drink a lot of water.  Calcium recommendations are 1200-1500 mg daily (1500 mg for postmenopausal women or women without ovaries), and vitamin D 1000 IU daily.  This should be obtained from diet and/or supplements (vitamins), and calcium should not be taken all at once, but in divided doses.  Monthly self breast exams and yearly mammograms for women over the age of 67 is recommended.  Sunscreen of at least SPF 30 should be used on all sun-exposed parts of the skin when outside between the hours of 10 am and 4 pm (not just when at beach or pool, but even with exercise, golf, tennis, and yard work!)  Use a sunscreen that says "broad spectrum" so it covers both UVA and UVB rays, and make sure to reapply every 1-2 hours.  Remember to change the batteries in your smoke detectors when changing your clock times in the spring and fall. Carbon monoxide detectors are recommended for your home.  Use your seat belt every time you are in a car, and please drive safely and not be distracted with cell phones and texting while driving.   Courtney Castro , Thank you for taking time to come for your Medicare Wellness Visit. I appreciate your ongoing  commitment to your health goals. Please review the following plan we discussed and let me know if I can assist you in the future.   This is a list of the screening recommended for you and due dates:  Health Maintenance  Topic Date Due  . Tetanus Vaccine  03/23/2027  . Flu Shot  Completed  . DEXA scan (bone density measurement)  Completed  . COVID-19 Vaccine  Completed  . Pneumonia vaccines  Completed  . HPV Vaccine  Aged Out   Continue yearly flu shots. Pneumovax booster was given today. We will check again for blood in the stool.  If positive, we will send you back to see Dr. Watt Climes.  You may take Mucinex (or Robitussin) if needed for any thick secretions (from the nose or postnasal drainage/phlegm).  This can be in addition to the flonase and allegra, only if/when needed.   Please check all your vitamins (preservision and multivitamin) to see how much calcium they contain.  Also look at your diet.  Goal is 1200-1500mg  TOTAL, from all sources.  If you aren't getting enough from your diet, you should take a separate calcium (Caltrate). Be sure to take this at lunch or later (4 hours separate from the thyroid medication).   Calcium Content in Foods Calcium is the most abundant mineral in the body. Most of the body's calcium supply is stored in bones and teeth. Calcium helps many parts of the body  function normally, including:  Blood and blood vessels.  Nerves.  Hormones.  Muscles.  Bones and teeth. When your calcium stores are low, you may be at risk for low bone mass, bone loss, and broken bones (fractures). When you get enough calcium, it helps to support strong bones and teeth throughout your life. Calcium is especially important for:  Children during growth spurts.  Girls during adolescence.  Women who are pregnant or breastfeeding.  Women after their menstrual cycle stops (postmenopause).  Women whose menstrual cycle has stopped due to anorexia nervosa or regular  intense exercise.  People who cannot eat or digest dairy products.  Vegans. Recommended daily amounts of calcium:  Women (ages 71 to 56): 1,000 mg per day.  Women (ages 77 and older): 1,200 mg per day.  Men (ages 27 to 50): 1,000 mg per day.  Men (ages 21 and older): 1,200 mg per day.  Women (ages 41 to 60): 1,300 mg per day.  Men (ages 73 to 65): 1,300 mg per day. General information  Eat foods that are high in calcium. Try to get most of your calcium from food.  Some people may benefit from taking calcium supplements. Check with your health care provider or diet and nutrition specialist (dietitian) before starting any calcium supplements. Calcium supplements may interact with certain medicines. Too much calcium may cause other health problems, such as constipation and kidney stones.  For the body to absorb calcium, it needs vitamin D. Sources of vitamin D include: ? Skin exposure to direct sunlight. ? Foods, such as egg yolks, liver, mushrooms, saltwater fish, and fortified milk. ? Vitamin D supplements. Check with your health care provider or dietitian before starting any vitamin D supplements. What foods are high in calcium? Foods that are high in calcium contain more than 100 milligrams per serving. Fruits  Fortified orange juice or other fruit juice, 300 mg per 8 oz serving. Vegetables  Collard greens, 360 mg per 8 oz serving.  Kale, 100 mg per 8 oz serving.  Bok choy, 160 mg per 8 oz serving. Grains  Fortified ready-to-eat cereals, 100 to 1,000 mg per 8 oz serving.  Fortified frozen waffles, 200 mg in 2 waffles.  Oatmeal, 140 mg in 1 cup. Meats and other proteins  Sardines, canned with bones, 325 mg per 3 oz serving.  Salmon, canned with bones, 180 mg per 3 oz serving.  Canned shrimp, 125 mg per 3 oz serving.  Baked beans, 160 mg per 4 oz serving.  Tofu, firm, made with calcium sulfate, 253 mg per 4 oz serving. Dairy  Yogurt, plain, low-fat, 310 mg per  6 oz serving.  Nonfat milk, 300 mg per 8 oz serving.  American cheese, 195 mg per 1 oz serving.  Cheddar cheese, 205 mg per 1 oz serving.  Cottage cheese 2%, 105 mg per 4 oz serving.  Fortified soy, rice, or almond milk, 300 mg per 8 oz serving.  Mozzarella, part skim, 210 mg per 1 oz serving. The items listed above may not be a complete list of foods high in calcium. Actual amounts of calcium may be different depending on processing. Contact a dietitian for more information.   What foods are lower in calcium? Foods that are lower in calcium contain 50 mg or less per serving. Fruits  Apple, about 6 mg.  Banana, about 12 mg. Vegetables  Lettuce, 19 mg per 2 oz serving.  Tomato, about 11 mg. Grains  Rice, 4 mg per 6 oz serving.  Boiled potatoes, 14 mg per 8 oz serving.  White bread, 6 mg per slice. Meats and other proteins  Egg, 27 mg per 2 oz serving.  Red meat, 7 mg per 4 oz serving.  Chicken, 17 mg per 4 oz serving.  Fish, cod, or trout, 20 mg per 4 oz serving. Dairy  Cream cheese, regular, 14 mg per 1 Tbsp serving.  Brie cheese, 50 mg per 1 oz serving.  Parmesan cheese, 70 mg per 1 Tbsp serving. The items listed above may not be a complete list of foods lower in calcium. Actual amounts of calcium may be different depending on processing. Contact a dietitian for more information. Summary  Calcium is an important mineral in the body because it affects many functions. Getting enough calcium helps support strong bones and teeth throughout your life.  Try to get most of your calcium from food.  Calcium supplements may interact with certain medicines. Check with your health care provider or dietitian before starting any calcium supplements. This information is not intended to replace advice given to you by your health care provider. Make sure you discuss any questions you have with your health care provider. Document Revised: 07/08/2019 Document Reviewed:  07/08/2019 Elsevier Patient Education  [place].

## 2020-05-30 ENCOUNTER — Other Ambulatory Visit: Payer: Self-pay

## 2020-05-30 ENCOUNTER — Ambulatory Visit (INDEPENDENT_AMBULATORY_CARE_PROVIDER_SITE_OTHER): Payer: Medicare Other | Admitting: Family Medicine

## 2020-05-30 ENCOUNTER — Encounter: Payer: Self-pay | Admitting: Family Medicine

## 2020-05-30 VITALS — BP 110/70 | HR 60 | Ht 64.0 in | Wt 128.4 lb

## 2020-05-30 DIAGNOSIS — Z0000 Encounter for general adult medical examination without abnormal findings: Secondary | ICD-10-CM | POA: Diagnosis not present

## 2020-05-30 DIAGNOSIS — Z853 Personal history of malignant neoplasm of breast: Secondary | ICD-10-CM

## 2020-05-30 DIAGNOSIS — Z5181 Encounter for therapeutic drug level monitoring: Secondary | ICD-10-CM

## 2020-05-30 DIAGNOSIS — Z23 Encounter for immunization: Secondary | ICD-10-CM

## 2020-05-30 DIAGNOSIS — J309 Allergic rhinitis, unspecified: Secondary | ICD-10-CM | POA: Diagnosis not present

## 2020-05-30 DIAGNOSIS — E782 Mixed hyperlipidemia: Secondary | ICD-10-CM | POA: Diagnosis not present

## 2020-05-30 DIAGNOSIS — I10 Essential (primary) hypertension: Secondary | ICD-10-CM

## 2020-05-30 DIAGNOSIS — E039 Hypothyroidism, unspecified: Secondary | ICD-10-CM

## 2020-05-30 MED ORDER — ATENOLOL 25 MG PO TABS: 25.0000 mg | ORAL_TABLET | Freq: Every day | ORAL | 1 refills | Status: DC

## 2020-05-30 MED ORDER — SIMVASTATIN 20 MG PO TABS: 20.0000 mg | ORAL_TABLET | Freq: Every day | ORAL | 3 refills | Status: DC

## 2020-05-30 MED ORDER — LOSARTAN POTASSIUM-HCTZ 100-25 MG PO TABS: 1.0000 | ORAL_TABLET | Freq: Every day | ORAL | 1 refills | Status: DC

## 2020-05-31 ENCOUNTER — Encounter: Payer: Self-pay | Admitting: Family Medicine

## 2020-05-31 LAB — CBC WITH DIFFERENTIAL/PLATELET
Basophils Absolute: 0 10*3/uL (ref 0.0–0.2)
Basos: 1 %
EOS (ABSOLUTE): 0.1 10*3/uL (ref 0.0–0.4)
Eos: 1 %
Hematocrit: 47.3 % — ABNORMAL HIGH (ref 34.0–46.6)
Hemoglobin: 15.9 g/dL (ref 11.1–15.9)
Immature Grans (Abs): 0 10*3/uL (ref 0.0–0.1)
Immature Granulocytes: 0 %
Lymphocytes Absolute: 0.9 10*3/uL (ref 0.7–3.1)
Lymphs: 10 %
MCH: 30.1 pg (ref 26.6–33.0)
MCHC: 33.6 g/dL (ref 31.5–35.7)
MCV: 90 fL (ref 79–97)
Monocytes Absolute: 1.1 10*3/uL — ABNORMAL HIGH (ref 0.1–0.9)
Monocytes: 13 %
Neutrophils Absolute: 6.6 10*3/uL (ref 1.4–7.0)
Neutrophils: 75 %
Platelets: 248 10*3/uL (ref 150–450)
RBC: 5.28 x10E6/uL (ref 3.77–5.28)
RDW: 12.4 % (ref 11.7–15.4)
WBC: 8.7 10*3/uL (ref 3.4–10.8)

## 2020-05-31 LAB — LIPID PANEL
Chol/HDL Ratio: 2.6 ratio (ref 0.0–4.4)
Cholesterol, Total: 170 mg/dL (ref 100–199)
HDL: 66 mg/dL (ref >39)
LDL Chol Calc (NIH): 89 mg/dL (ref 0–99)
Triglycerides: 81 mg/dL (ref 0–149)
VLDL Cholesterol Cal: 15 mg/dL (ref 5–40)

## 2020-05-31 LAB — COMPREHENSIVE METABOLIC PANEL
ALT: 21 IU/L (ref 0–32)
AST: 29 IU/L (ref 0–40)
Albumin/Globulin Ratio: 2.8 — ABNORMAL HIGH (ref 1.2–2.2)
Albumin: 4.5 g/dL (ref 3.6–4.6)
Alkaline Phosphatase: 113 IU/L (ref 44–121)
BUN/Creatinine Ratio: 22 (ref 12–28)
BUN: 16 mg/dL (ref 8–27)
Bilirubin Total: 1.3 mg/dL — ABNORMAL HIGH (ref 0.0–1.2)
CO2: 24 mmol/L (ref 20–29)
Calcium: 10.2 mg/dL (ref 8.7–10.3)
Chloride: 96 mmol/L (ref 96–106)
Creatinine, Ser: 0.74 mg/dL (ref 0.57–1.00)
Globulin, Total: 1.6 g/dL (ref 1.5–4.5)
Glucose: 97 mg/dL (ref 65–99)
Potassium: 3.9 mmol/L (ref 3.5–5.2)
Sodium: 136 mmol/L (ref 134–144)
Total Protein: 6.1 g/dL (ref 6.0–8.5)
eGFR: 79 mL/min/{1.73_m2} (ref >59)

## 2020-07-01 LAB — FECAL OCCULT BLOOD, IMMUNOCHEMICAL

## 2020-07-11 ENCOUNTER — Other Ambulatory Visit: Payer: Self-pay | Admitting: *Deleted

## 2020-07-11 DIAGNOSIS — Z0000 Encounter for general adult medical examination without abnormal findings: Secondary | ICD-10-CM

## 2020-07-20 DIAGNOSIS — Z0000 Encounter for general adult medical examination without abnormal findings: Secondary | ICD-10-CM | POA: Diagnosis not present

## 2020-07-23 LAB — FECAL OCCULT BLOOD, IMMUNOCHEMICAL: Fecal Occult Bld: NEGATIVE

## 2020-07-25 ENCOUNTER — Other Ambulatory Visit: Payer: Self-pay | Admitting: Family Medicine

## 2020-07-25 DIAGNOSIS — J309 Allergic rhinitis, unspecified: Secondary | ICD-10-CM

## 2020-07-28 DIAGNOSIS — H353112 Nonexudative age-related macular degeneration, right eye, intermediate dry stage: Secondary | ICD-10-CM | POA: Diagnosis not present

## 2020-07-28 DIAGNOSIS — Z961 Presence of intraocular lens: Secondary | ICD-10-CM | POA: Diagnosis not present

## 2020-07-28 DIAGNOSIS — H524 Presbyopia: Secondary | ICD-10-CM | POA: Diagnosis not present

## 2020-08-04 DIAGNOSIS — H353112 Nonexudative age-related macular degeneration, right eye, intermediate dry stage: Secondary | ICD-10-CM | POA: Diagnosis not present

## 2020-08-04 DIAGNOSIS — H353221 Exudative age-related macular degeneration, left eye, with active choroidal neovascularization: Secondary | ICD-10-CM | POA: Diagnosis not present

## 2020-08-12 ENCOUNTER — Other Ambulatory Visit: Payer: Self-pay

## 2020-08-12 ENCOUNTER — Telehealth: Payer: Self-pay

## 2020-08-12 DIAGNOSIS — E039 Hypothyroidism, unspecified: Secondary | ICD-10-CM

## 2020-08-12 MED ORDER — LEVOTHYROXINE SODIUM 25 MCG PO TABS: 25.0000 ug | ORAL_TABLET | Freq: Every day | ORAL | 1 refills | Status: DC

## 2020-08-12 NOTE — Telephone Encounter (Signed)
Disregard already sent to [facility].

## 2020-08-12 NOTE — Telephone Encounter (Signed)
Received fax from [facility] for a refill on the pts. Levothyroxine pt. Last apt was 05/30/20 and next apt is 11/30/20.

## 2020-10-10 ENCOUNTER — Telehealth: Payer: Self-pay | Admitting: Family Medicine

## 2020-10-10 NOTE — Telephone Encounter (Signed)
Pt called and asked if Dr. Tomi Bamberger recommended she get the second COVID booster. She is scheduled to fly next month. Please advise pt at [phone].

## 2020-10-10 NOTE — Telephone Encounter (Signed)
Advise pt--I highly encourage you to get the 2nd booster (no need to wait, can schedule NV for this week)

## 2020-10-10 NOTE — Telephone Encounter (Signed)
Called patient and scheduled her for Thursday @ 9:00am.

## 2020-10-13 ENCOUNTER — Other Ambulatory Visit: Payer: Self-pay

## 2020-10-13 ENCOUNTER — Ambulatory Visit: Payer: Medicare Other

## 2020-10-13 DIAGNOSIS — Z23 Encounter for immunization: Secondary | ICD-10-CM

## 2020-11-10 DIAGNOSIS — Z961 Presence of intraocular lens: Secondary | ICD-10-CM | POA: Diagnosis not present

## 2020-11-10 DIAGNOSIS — H353112 Nonexudative age-related macular degeneration, right eye, intermediate dry stage: Secondary | ICD-10-CM | POA: Diagnosis not present

## 2020-11-10 DIAGNOSIS — D3132 Benign neoplasm of left choroid: Secondary | ICD-10-CM | POA: Diagnosis not present

## 2020-11-10 DIAGNOSIS — H353221 Exudative age-related macular degeneration, left eye, with active choroidal neovascularization: Secondary | ICD-10-CM | POA: Diagnosis not present

## 2020-11-29 NOTE — Progress Notes (Addendum)
Chief Complaint  Patient presents with   Hypertension    Nonfasting med check. Is going to grief group tomorrow at church for the first time, wanted you to know. Has IBS and it has been flaring up. Has notices that she doesn't have any pain but has lots of gas and goes to the bathroom frequently but small amounts. Certain foods irritate more than others. (Putting her on my list for new covid booster and will call when it comes in).    Patient presents for 6 month follow-up.  She is settled into her townhome. She continues to see Ezekiel Slocumb for counseling. Goes to grief counseling for the first time tomorrow. She went to [place] and [place] with her daughter over the summer--went to the church where she as married, saw childhood friends, visited family.  This was very meaningful for her to do. She is still settling [facility] [employer].  Sometimes has a hard time getting back to sleep if she wakes up, if she starts thinking about things. This doesn't occur more than once a week.  Hypertension.  She hasn't been checking her blood pressure.  Recalls it being similar to today's when checked elsewhere.  Compliant with losartan-HCT 100-25 and atenolol '25mg'$ , denies side effects. She denies HA, dizziness, chest pain, muscle cramps, edema.  Hyperlipidemia follow-up:  Patient is reportedly following a low-fat, low cholesterol diet.  Compliant with medications (simvastatin '20mg'$ ) and denies medication side effects Lipids were at goal on last check. Lab Results  Component Value Date   CHOL 170 05/30/2020   HDL 66 05/30/2020   LDLCALC 89 05/30/2020   TRIG 81 05/30/2020   CHOLHDL 2.6 05/30/2020   Hypothyroidism: She reports compliance with taking medication on empty stomach. She denies changes to hair/skin/nail, energy/weight/moods.  She has long h/o IBS. Stools lately have been small, slightly gassy, no straining, no abdominal pain.  Due for recheck of TSH. Lab Results  Component Value Date   TSH 1.410 12/03/2019    H/o impaired fasting glucose: She tries to limit her sweets and have small portions. A1c's have been normal. Sugars have been normal the last few years.  Allergies: year-round, controlled with flonase and allegra daily. Currently doing well (flared a little in July).   Macular degeneration--Had been getting injections into the L eye by Dr. Cordelia Pen, hasn't needed one in quite a while (about a year), saw him recently, doing well.  Not driving much at night.    PMH, PSH, SH reviewed  Outpatient Encounter Medications as of 11/30/2020  Medication Sig Note   Calcium Carbonate (CALTRATE 600 PO) Take 1 tablet by mouth daily.    fexofenadine (ALLEGRA) 180 MG tablet Take 180 mg by mouth daily.    GLUCOSAMINE PO Take 2,000 mg by mouth 2 (two) times daily. 12/03/2019: Has 2,000iu vitamin d   levothyroxine (SYNTHROID) 25 MCG tablet Take 1 tablet (25 mcg total) by mouth daily.    Multiple Vitamins-Minerals (CENTRUM SILVER PO) Take 1 tablet by mouth daily.    Multiple Vitamins-Minerals (PRESERVISION/LUTEIN) CAPS Take 1 capsule by mouth 2 (two) times daily.    simvastatin (ZOCOR) 20 MG tablet Take 1 tablet (20 mg total) by mouth at bedtime.    [DISCONTINUED] atenolol (TENORMIN) 25 MG tablet Take 1 tablet (25 mg total) by mouth daily.    [DISCONTINUED] fluticasone (FLONASE) 50 MCG/ACT nasal spray SPRAY TWO SPRAYS IN EACH NOSTRIL ONCE DAILY    [DISCONTINUED] losartan-hydrochlorothiazide (HYZAAR) 100-25 MG tablet Take 1 tablet by mouth daily.  atenolol (TENORMIN) 25 MG tablet Take 1 tablet (25 mg total) by mouth daily.    fluticasone (FLONASE) 50 MCG/ACT nasal spray SPRAY TWO SPRAYS IN EACH NOSTRIL ONCE DAILY    losartan-hydrochlorothiazide (HYZAAR) 100-25 MG tablet Take 1 tablet by mouth daily.    No facility-administered encounter medications on file as of 11/30/2020.   Allergies  Allergen Reactions   Percocet [Oxycodone-Acetaminophen] Other (See Comments)    Upset stomach     ROS:  No fever,  chills, URI symptoms, chest pain, headaches, dizziness, syncope, no nausea, vomiting, heartburn, urinary complaints.  No edema. Denies abdominal pain, blood in the stool. Bowels per HPI.  No joint pains, bleeding, bruising, rash. Moods are good.     PHYSICAL EXAM:  BP 138/82   Pulse 64   Ht '5\' 4"'$  (1.626 m)   Wt 135 lb 3.2 oz (61.3 kg)   BMI 23.21 kg/m   Wt Readings from Last 3 Encounters:  11/30/20 135 lb 3.2 oz (61.3 kg)  05/30/20 128 lb 6.4 oz (58.2 kg)  04/21/20 130 lb (59 kg)   Well developed, pleasant female in no distress HEENT:  PERRL, EOMI, conjunctiva clear. Wearing mask. Neck: no lymphadenopathy, thyromegaly or carotid bruit. Heart: regular rate and rhythm, no murmur Lungs: clear bilaterally Abdomen: soft, nontender, no organomegaly or mass Back: no CVA or spinal tenderness Extremities: No edema, 2+ pulses Skin: normal turgor, no rashes, lesions Psych: normal mood, affect, hygiene and grooming. Neuro: alert and oriented. Normal strength, gait.  ASSESSMENT/PLAN:  Hypothyroidism, unspecified type - Due for TSH, euthyroid by history - Plan: TSH  Mixed hyperlipidemia - lipids at goal on last check. Cont current med  Need for influenza vaccination - Plan: Flu Vaccine QUAD High Dose(Fluad)  Essential hypertension, benign - controlled, borderline in office. Continue low Na diet, exercise and current meds - Plan: atenolol (TENORMIN) 25 MG tablet, losartan-hydrochlorothiazide (HYZAAR) 100-25 MG tablet  Allergic rhinitis, unspecified seasonality, unspecified trigger - controlled on current regimen - Plan: fluticasone (FLONASE) 50 MCG/ACT nasal spray   CPE is scheduled for March (fasting)  New COVID vaccine recommended, timing discussed.

## 2020-11-29 NOTE — Patient Instructions (Addendum)
Continue to follow a high fiber diet and drink plenty of water. Let us know if you are having abdominal pain or blood in the stool.  Some variation to the frequency and texture of your stool (soft vs hard) is normal.  I recommend getting the new bivalent COVID vaccine (which covers variants better) when it becomes available--needs to be 2 months from your last one (on or after 12/14/20), and needs to be 2 weeks from today's flu shot.

## 2020-11-30 ENCOUNTER — Other Ambulatory Visit: Payer: Self-pay

## 2020-11-30 ENCOUNTER — Encounter: Payer: Self-pay | Admitting: Family Medicine

## 2020-11-30 ENCOUNTER — Ambulatory Visit (INDEPENDENT_AMBULATORY_CARE_PROVIDER_SITE_OTHER): Payer: Medicare Other | Admitting: Family Medicine

## 2020-11-30 VITALS — BP 138/82 | HR 64 | Ht 64.0 in | Wt 135.2 lb

## 2020-11-30 DIAGNOSIS — I10 Essential (primary) hypertension: Secondary | ICD-10-CM | POA: Diagnosis not present

## 2020-11-30 DIAGNOSIS — Z23 Encounter for immunization: Secondary | ICD-10-CM

## 2020-11-30 DIAGNOSIS — E039 Hypothyroidism, unspecified: Secondary | ICD-10-CM | POA: Diagnosis not present

## 2020-11-30 DIAGNOSIS — J309 Allergic rhinitis, unspecified: Secondary | ICD-10-CM | POA: Diagnosis not present

## 2020-11-30 DIAGNOSIS — E782 Mixed hyperlipidemia: Secondary | ICD-10-CM | POA: Diagnosis not present

## 2020-11-30 MED ORDER — ATENOLOL 25 MG PO TABS: 25.0000 mg | ORAL_TABLET | Freq: Every day | ORAL | 1 refills | Status: DC

## 2020-11-30 MED ORDER — FLUTICASONE PROPIONATE 50 MCG/ACT NA SUSP: NASAL | 1 refills | Status: DC

## 2020-11-30 MED ORDER — LOSARTAN POTASSIUM-HCTZ 100-25 MG PO TABS: 1.0000 | ORAL_TABLET | Freq: Every day | ORAL | 1 refills | Status: DC

## 2020-12-01 LAB — TSH: TSH: 1.25 u[IU]/mL (ref 0.450–4.500)

## 2020-12-01 MED ORDER — LEVOTHYROXINE SODIUM 25 MCG PO TABS: 25.0000 ug | ORAL_TABLET | Freq: Every day | ORAL | 3 refills | Status: DC

## 2020-12-22 ENCOUNTER — Other Ambulatory Visit: Payer: Self-pay

## 2020-12-22 ENCOUNTER — Other Ambulatory Visit (INDEPENDENT_AMBULATORY_CARE_PROVIDER_SITE_OTHER): Payer: Medicare Other

## 2020-12-22 DIAGNOSIS — Z23 Encounter for immunization: Secondary | ICD-10-CM

## 2021-01-16 ENCOUNTER — Encounter: Payer: Self-pay | Admitting: Family Medicine

## 2021-01-16 ENCOUNTER — Other Ambulatory Visit: Payer: Self-pay

## 2021-01-16 ENCOUNTER — Ambulatory Visit (INDEPENDENT_AMBULATORY_CARE_PROVIDER_SITE_OTHER): Payer: Medicare Other | Admitting: Family Medicine

## 2021-01-16 VITALS — BP 120/68 | HR 72 | Ht 64.0 in | Wt 138.8 lb

## 2021-01-16 DIAGNOSIS — M25652 Stiffness of left hip, not elsewhere classified: Secondary | ICD-10-CM | POA: Diagnosis not present

## 2021-01-16 DIAGNOSIS — W19XXXA Unspecified fall, initial encounter: Secondary | ICD-10-CM | POA: Diagnosis not present

## 2021-01-16 DIAGNOSIS — T07XXXA Unspecified multiple injuries, initial encounter: Secondary | ICD-10-CM | POA: Diagnosis not present

## 2021-01-16 DIAGNOSIS — M79674 Pain in right toe(s): Secondary | ICD-10-CM

## 2021-01-16 NOTE — Patient Instructions (Addendum)
It doesn't appear that you have any significant injuries. Your skin findings are superficial.  Please use antibacterial ointment until they heal.  Your right pinkie toe--if there is a fracture we wouldn't treat it any differently. You can try "buddy-taping" it to the 4th toe, to act as a splint until it feels better.  Continue to avoid foods which trigger an upset stomach (typically is good to avoid dairy, and to ensure that you drink enough water and eat a high fiber dite). If you have any recurrent pain in the upper stomach, return for re-evaluation.  We will see you in March, unless you have any ongoing issues or new problems develop.

## 2021-01-16 NOTE — Progress Notes (Signed)
Chief Complaint  Patient presents with   Fall    Had a fall while at a spa in [place] visiting daughter, [name]. Date of fall was was 01/10/21. [place] outside walking down a curvy hill after pool/hottub. Did not hot her head. Bruised R side rib area. Seeing Oin at [facility] here in [place] came from there. Wanted to follow up with you. Has also been having bowel issues related to her IBS. Not sure if the diet at the spa had anything to do with this.    [place] 10/18, while walking down curvy path after being in hot tub. She was wearing the sandals given by the spa.  She landed on forearms, didn't hit her head. She also scraped up her feet. She has had some discomfort in the RUQ/ibs--unsure if this was from how she fell, or related to her IBS. She has had some change in her bowels--they are smaller and harder.  Denies pain currently, but took tylenol earlier.  She has been having some low back pain.  Saw Lodema Pilot for PT today for first appointment, thinks it really helped. Denies back pain currently. Has 2 more appts scheduled this week.   PMH, PSH, SH reviewed  Outpatient Encounter Medications as of 01/16/2021  Medication Sig Note   acetaminophen (TYLENOL) 500 MG tablet Take 1,000 mg by mouth every 6 (six) hours as needed. 01/16/2021: Took at 9am today   atenolol (TENORMIN) 25 MG tablet Take 1 tablet (25 mg total) by mouth daily.    Calcium Carbonate (CALTRATE 600 PO) Take 1 tablet by mouth daily.    fexofenadine (ALLEGRA) 180 MG tablet Take 180 mg by mouth daily.    fluticasone (FLONASE) 50 MCG/ACT nasal spray SPRAY TWO SPRAYS IN EACH NOSTRIL ONCE DAILY    GLUCOSAMINE PO Take 2,000 mg by mouth 2 (two) times daily. 12/03/2019: Has 2,000iu vitamin d   levothyroxine (SYNTHROID) 25 MCG tablet Take 1 tablet (25 mcg total) by mouth daily.    losartan-hydrochlorothiazide (HYZAAR) 100-25 MG tablet Take 1 tablet by mouth daily.    Multiple Vitamins-Minerals (CENTRUM SILVER PO) Take 1 tablet by mouth  daily.    Multiple Vitamins-Minerals (PRESERVISION/LUTEIN) CAPS Take 1 capsule by mouth 2 (two) times daily.    simvastatin (ZOCOR) 20 MG tablet Take 1 tablet (20 mg total) by mouth at bedtime.    No facility-administered encounter medications on file as of 01/16/2021.   Allergies  Allergen Reactions   Percocet [Oxycodone-Acetaminophen] Other (See Comments)    Upset stomach    ROS: Denies fever, chills, URI symptoms, headaches, dizziness, shortness of breath, chest pain.  Denies nausea, vomiting, urinary complaints, bleeding, rash. Scrapes on arms, ankles, pain in R little toe. RUQ vs rib pain, per HPI.   PHYSICAL EXAM:  BP 120/68   Pulse 72   Ht 5\' 4"  (1.626 m)   Wt 138 lb 12.8 oz (63 kg)   BMI 23.82 kg/m   Wt Readings from Last 3 Encounters:  01/16/21 138 lb 12.8 oz (63 kg)  11/30/20 135 lb 3.2 oz (61.3 kg)  05/30/20 128 lb 6.4 oz (58.2 kg)   Pleasant, well-appearing, slightly anxious female in no distress HEENT: conjunctiva and sclera are clear EOMI, wearing mask Neck: no cervical spine tenderness, lymphadenopathy or mass Heart: regular rate and rhythm Lungs: clear bilaterally Abdomen--soft, nontender, no bruising. No hepatosplenomegaly, negative Murphy sign. Extremities: Many superficial excoriations on anterior forearms bilaterally Some purpura on posterior forearms L>R (chronic) There is a superficial abrasion/scrapes at  the R ankle laterally and the L medial ankle, with some soreness at the R bunion. Ecchymosis at the base of the right 3rd toe, nontender R 5th toe--nontender along metatarsal and MTP, slight tenderness over the middle portion of the toe, no swelling   ASSESSMENT/PLAN:  Multiple abrasions - none with any evidence of infection. To use antibacterial ointment until healed  Pain of toe of right foot - 5th toe--recommended buddy-tape to 4th toe.  f/u if persistent pain  Fall, initial encounter - DOI 10/18

## 2021-01-17 DIAGNOSIS — S20222A Contusion of left back wall of thorax, initial encounter: Secondary | ICD-10-CM | POA: Diagnosis not present

## 2021-01-17 DIAGNOSIS — S60221A Contusion of right hand, initial encounter: Secondary | ICD-10-CM | POA: Diagnosis not present

## 2021-01-17 DIAGNOSIS — S61214A Laceration without foreign body of right ring finger without damage to nail, initial encounter: Secondary | ICD-10-CM | POA: Diagnosis not present

## 2021-01-17 DIAGNOSIS — W19XXXA Unspecified fall, initial encounter: Secondary | ICD-10-CM | POA: Diagnosis not present

## 2021-01-18 DIAGNOSIS — M25652 Stiffness of left hip, not elsewhere classified: Secondary | ICD-10-CM | POA: Diagnosis not present

## 2021-01-19 ENCOUNTER — Other Ambulatory Visit: Payer: Self-pay

## 2021-01-19 ENCOUNTER — Ambulatory Visit: Payer: Medicare Other | Admitting: Family Medicine

## 2021-01-19 ENCOUNTER — Encounter: Payer: Self-pay | Admitting: Family Medicine

## 2021-01-19 VITALS — BP 130/62 | HR 86 | Wt 139.2 lb

## 2021-01-19 DIAGNOSIS — R296 Repeated falls: Secondary | ICD-10-CM | POA: Diagnosis not present

## 2021-01-19 DIAGNOSIS — R630 Anorexia: Secondary | ICD-10-CM

## 2021-01-19 DIAGNOSIS — F419 Anxiety disorder, unspecified: Secondary | ICD-10-CM | POA: Diagnosis not present

## 2021-01-19 NOTE — Patient Instructions (Addendum)
We are going to try and have someone come to your home to do a safety evaluation, to try and help prevent any further falls.  Mention your falls and concerns about balance to Eoin, and see if he will work specifically on that, and whether there may be some home exercises to also help.  I think using a walker now will help, while you're feeling off balance and anxious about falling.  It is very important that you eat, even if you don't feel hungry. If not hungry, at least snack or have an Ensure or other protein shake/meal replacement.  Continue as planned to speak with your therapist next week.  You may need to meet with her a little more often.

## 2021-01-19 NOTE — Progress Notes (Signed)
Chief Complaint  Patient presents with   Courtney Castro on Tuesday at home went to [facility] urgent care. Has stitches on 2 fingers right hand. Since fall continues to be wobbly, no appetite, not wanting to drink any fluids. Daughter states she is not herself and having more anxiety about falls. Has noticed short term memory issues since fall on Tuesday   Patient presents for UC follow-up, accompanied by her daughter Courtney Castro.  Tuesday morning (10/25), while in the bathroom trying to get situated (prior to pulling down pants) she lost her balance, fell backwards into the toilet, hit the tank with her back, and it cracked. She thinks the cracked tank is what caused the laceration to her R hand (on 4th and 5th fingers, sutured in urgent care).  She reports having a large bruise on her back.  Denies any head injury or neck pain.  Taking Tylenol Arthritis for the pain.  She has iced her back once.  Has also used some topical medication.  They replaced the toilet, and are planning to put bars up in both downstairs bathrooms. +throw rugs in the house. Daughter has concerns about fall risks. She has fallen 2x in the last week (Seen 3 days for f/u on Fall from when she was out of town).  She reports being very anxious, doesn't have an appetite.  She isn't sleeping well. She feels a little unsteady.   She denies dizziness. Her daughter notes a significant change in her mom, worries about depression.  Reports her having some obsessive thoughts (regarding having a pile of crossword puzzles she hadn't completed yet, amongst other things).  She has a therapist, but has cut back on seeing her to just q4 weeks.  She does have an appointment scheduled for Monday (granddaughter traded appointments with her).   PMH, PSH, SH reviewed  Outpatient Encounter Medications as of 01/19/2021  Medication Sig Note   acetaminophen (TYLENOL) 500 MG tablet Take 1,000 mg by mouth every 6 (six) hours as needed. 01/19/2021: Last  taken this morning 10 am   atenolol (TENORMIN) 25 MG tablet Take 1 tablet (25 mg total) by mouth daily.    fexofenadine (ALLEGRA) 180 MG tablet Take 180 mg by mouth daily.    fluticasone (FLONASE) 50 MCG/ACT nasal spray SPRAY TWO SPRAYS IN EACH NOSTRIL ONCE DAILY    levothyroxine (SYNTHROID) 25 MCG tablet Take 1 tablet (25 mcg total) by mouth daily.    losartan-hydrochlorothiazide (HYZAAR) 100-25 MG tablet Take 1 tablet by mouth daily.    Multiple Vitamins-Minerals (PRESERVISION/LUTEIN) CAPS Take 1 capsule by mouth 2 (two) times daily.    simvastatin (ZOCOR) 20 MG tablet Take 1 tablet (20 mg total) by mouth at bedtime.    Calcium Carbonate (CALTRATE 600 PO) Take 1 tablet by mouth daily. (Patient not taking: Reported on 01/19/2021)    GLUCOSAMINE PO Take 2,000 mg by mouth 2 (two) times daily. (Patient not taking: Reported on 01/19/2021) 12/03/2019: Has 2,000iu vitamin d   Multiple Vitamins-Minerals (CENTRUM SILVER PO) Take 1 tablet by mouth daily. (Patient not taking: Reported on 01/19/2021)    No facility-administered encounter medications on file as of 01/19/2021.   Allergies  Allergen Reactions   Percocet [Oxycodone-Acetaminophen] Other (See Comments)    Upset stomach   ROS:  no fever, chills, URI symptoms. No headaches, dizziness, chest pain, shortness of breath. +bruising related to fall (back).  No n/v/d, urinary complaints, rashes or other concerns except as noted in HPI   PHYSICAL EXAM:  BP 130/62 (BP Location: Left Arm, Patient Position: Sitting)   Pulse 86   Wt 139 lb 3.2 oz (63.1 kg)   SpO2 93%   BMI 23.89 kg/m   Wt Readings from Last 3 Encounters:  01/19/21 139 lb 3.2 oz (63.1 kg)  01/16/21 138 lb 12.8 oz (63 kg)  11/30/20 135 lb 3.2 oz (61.3 kg)    Elderly female, alert and oriented, but seeming a little out of sorts, and not as confident in herself today. In speaking, she does go into a lot of detail in explaining every situation, sometimes going off on tangents (such  as related to the crossword puzzles). Seems anxious (more than depressed). Normal eye contact, hygiene and grooming HEENT: conjunctiva and sclera are clear, EOMI, wearing mask Neck:  no cervical spine tenderness, lymphadenopathy or thyromegaly Heart: regular rate and rhythm Lungs: clear bilaterally Back: no spinal or CVA tenderness.  +bruising just below R CVA. No hematoma. Skin: Many AK's scattered throughout--small abrasion (where one may have been scraped off) on R lower back, without any crusting or STS or erythema. R 4th and 5th fingers are bandaged.  Visible portion of hand appears normal Neuro: alert and oriented.  Normal gait.  Seems slightly confused, unsure of herself, less confident in her voice/speaking, anxious.  ASSESSMENT/PLAN:  Multiple falls - Rec home safety eval; install bars, remove throw rugs. To d/w PT for HEP. Rec using walker for now until stronger/confident  Anxiety - encouraged more regular therapy visits. Might be masking some dementia/memory changes. Cont brain/physical exercise (when safe/steadier)  Decreased appetite - suspect related to anxiety/worry. Discussed importance of eating to maintain strength, and suggested foods, supplements  F/u next week for suture removal from fingers.   I spent 38 minutes dedicated to the care of this patient, including pre-visit review of records, face to face time, post-visit ordering of testing and documentation.

## 2021-01-20 ENCOUNTER — Emergency Department (HOSPITAL_COMMUNITY): Payer: Medicare Other

## 2021-01-20 ENCOUNTER — Inpatient Hospital Stay (HOSPITAL_COMMUNITY)
Admission: EM | Admit: 2021-01-20 | Discharge: 2021-01-29 | DRG: 871 | Disposition: A | Discharge status: Home Health | Payer: Medicare Other | Attending: Internal Medicine | Admitting: Internal Medicine

## 2021-01-20 ENCOUNTER — Observation Stay (HOSPITAL_COMMUNITY): Payer: Medicare Other

## 2021-01-20 ENCOUNTER — Encounter (HOSPITAL_COMMUNITY): Payer: Self-pay | Admitting: Emergency Medicine

## 2021-01-20 ENCOUNTER — Other Ambulatory Visit: Payer: Self-pay

## 2021-01-20 DIAGNOSIS — Z8249 Family history of ischemic heart disease and other diseases of the circulatory system: Secondary | ICD-10-CM

## 2021-01-20 DIAGNOSIS — Z20822 Contact with and (suspected) exposure to covid-19: Secondary | ICD-10-CM | POA: Diagnosis present

## 2021-01-20 DIAGNOSIS — G9341 Metabolic encephalopathy: Secondary | ICD-10-CM | POA: Diagnosis not present

## 2021-01-20 DIAGNOSIS — J90 Pleural effusion, not elsewhere classified: Secondary | ICD-10-CM | POA: Diagnosis not present

## 2021-01-20 DIAGNOSIS — E876 Hypokalemia: Secondary | ICD-10-CM | POA: Diagnosis present

## 2021-01-20 DIAGNOSIS — I609 Nontraumatic subarachnoid hemorrhage, unspecified: Secondary | ICD-10-CM

## 2021-01-20 DIAGNOSIS — G9389 Other specified disorders of brain: Secondary | ICD-10-CM | POA: Diagnosis not present

## 2021-01-20 DIAGNOSIS — R7989 Other specified abnormal findings of blood chemistry: Secondary | ICD-10-CM | POA: Diagnosis not present

## 2021-01-20 DIAGNOSIS — Z9049 Acquired absence of other specified parts of digestive tract: Secondary | ICD-10-CM

## 2021-01-20 DIAGNOSIS — E782 Mixed hyperlipidemia: Secondary | ICD-10-CM | POA: Diagnosis not present

## 2021-01-20 DIAGNOSIS — R6520 Severe sepsis without septic shock: Secondary | ICD-10-CM | POA: Diagnosis present

## 2021-01-20 DIAGNOSIS — Z853 Personal history of malignant neoplasm of breast: Secondary | ICD-10-CM | POA: Diagnosis not present

## 2021-01-20 DIAGNOSIS — Z79890 Hormone replacement therapy (postmenopausal): Secondary | ICD-10-CM

## 2021-01-20 DIAGNOSIS — E039 Hypothyroidism, unspecified: Secondary | ICD-10-CM | POA: Diagnosis present

## 2021-01-20 DIAGNOSIS — J9601 Acute respiratory failure with hypoxia: Secondary | ICD-10-CM | POA: Diagnosis not present

## 2021-01-20 DIAGNOSIS — Z85828 Personal history of other malignant neoplasm of skin: Secondary | ICD-10-CM

## 2021-01-20 DIAGNOSIS — M7989 Other specified soft tissue disorders: Secondary | ICD-10-CM | POA: Diagnosis not present

## 2021-01-20 DIAGNOSIS — J189 Pneumonia, unspecified organism: Secondary | ICD-10-CM | POA: Diagnosis not present

## 2021-01-20 DIAGNOSIS — Z9851 Tubal ligation status: Secondary | ICD-10-CM

## 2021-01-20 DIAGNOSIS — N390 Urinary tract infection, site not specified: Secondary | ICD-10-CM | POA: Diagnosis not present

## 2021-01-20 DIAGNOSIS — A419 Sepsis, unspecified organism: Secondary | ICD-10-CM | POA: Diagnosis not present

## 2021-01-20 DIAGNOSIS — Z96653 Presence of artificial knee joint, bilateral: Secondary | ICD-10-CM | POA: Diagnosis present

## 2021-01-20 DIAGNOSIS — K589 Irritable bowel syndrome without diarrhea: Secondary | ICD-10-CM | POA: Diagnosis not present

## 2021-01-20 DIAGNOSIS — A481 Legionnaires' disease: Secondary | ICD-10-CM

## 2021-01-20 DIAGNOSIS — E861 Hypovolemia: Secondary | ICD-10-CM | POA: Diagnosis present

## 2021-01-20 DIAGNOSIS — I471 Supraventricular tachycardia: Secondary | ICD-10-CM | POA: Diagnosis not present

## 2021-01-20 DIAGNOSIS — Z79899 Other long term (current) drug therapy: Secondary | ICD-10-CM

## 2021-01-20 DIAGNOSIS — H3530 Unspecified macular degeneration: Secondary | ICD-10-CM | POA: Diagnosis present

## 2021-01-20 DIAGNOSIS — R0689 Other abnormalities of breathing: Secondary | ICD-10-CM | POA: Diagnosis not present

## 2021-01-20 DIAGNOSIS — R918 Other nonspecific abnormal finding of lung field: Secondary | ICD-10-CM | POA: Diagnosis not present

## 2021-01-20 DIAGNOSIS — I248 Other forms of acute ischemic heart disease: Secondary | ICD-10-CM | POA: Diagnosis not present

## 2021-01-20 DIAGNOSIS — R6889 Other general symptoms and signs: Secondary | ICD-10-CM | POA: Diagnosis not present

## 2021-01-20 DIAGNOSIS — J154 Pneumonia due to other streptococci: Secondary | ICD-10-CM | POA: Diagnosis present

## 2021-01-20 DIAGNOSIS — R0602 Shortness of breath: Secondary | ICD-10-CM | POA: Diagnosis not present

## 2021-01-20 DIAGNOSIS — J9811 Atelectasis: Secondary | ICD-10-CM | POA: Diagnosis not present

## 2021-01-20 DIAGNOSIS — G9340 Encephalopathy, unspecified: Secondary | ICD-10-CM | POA: Diagnosis not present

## 2021-01-20 DIAGNOSIS — Z803 Family history of malignant neoplasm of breast: Secondary | ICD-10-CM

## 2021-01-20 DIAGNOSIS — Z66 Do not resuscitate: Secondary | ICD-10-CM | POA: Diagnosis present

## 2021-01-20 DIAGNOSIS — R296 Repeated falls: Secondary | ICD-10-CM | POA: Diagnosis not present

## 2021-01-20 DIAGNOSIS — Z90710 Acquired absence of both cervix and uterus: Secondary | ICD-10-CM

## 2021-01-20 DIAGNOSIS — J302 Other seasonal allergic rhinitis: Secondary | ICD-10-CM | POA: Diagnosis present

## 2021-01-20 DIAGNOSIS — D329 Benign neoplasm of meninges, unspecified: Secondary | ICD-10-CM | POA: Diagnosis not present

## 2021-01-20 DIAGNOSIS — I10 Essential (primary) hypertension: Secondary | ICD-10-CM | POA: Diagnosis present

## 2021-01-20 DIAGNOSIS — R531 Weakness: Secondary | ICD-10-CM | POA: Diagnosis not present

## 2021-01-20 DIAGNOSIS — Z87891 Personal history of nicotine dependence: Secondary | ICD-10-CM

## 2021-01-20 DIAGNOSIS — B9620 Unspecified Escherichia coli [E. coli] as the cause of diseases classified elsewhere: Secondary | ICD-10-CM | POA: Diagnosis present

## 2021-01-20 DIAGNOSIS — R778 Other specified abnormalities of plasma proteins: Secondary | ICD-10-CM

## 2021-01-20 DIAGNOSIS — Z743 Need for continuous supervision: Secondary | ICD-10-CM | POA: Diagnosis not present

## 2021-01-20 DIAGNOSIS — R000 Tachycardia, unspecified: Secondary | ICD-10-CM | POA: Diagnosis not present

## 2021-01-20 DIAGNOSIS — R945 Abnormal results of liver function studies: Secondary | ICD-10-CM

## 2021-01-20 DIAGNOSIS — E871 Hypo-osmolality and hyponatremia: Secondary | ICD-10-CM | POA: Diagnosis not present

## 2021-01-20 DIAGNOSIS — Z86010 Personal history of colonic polyps: Secondary | ICD-10-CM

## 2021-01-20 DIAGNOSIS — Z885 Allergy status to narcotic agent status: Secondary | ICD-10-CM

## 2021-01-20 DIAGNOSIS — R4701 Aphasia: Secondary | ICD-10-CM | POA: Diagnosis not present

## 2021-01-20 LAB — RESP PANEL BY RT-PCR (FLU A&B, COVID) ARPGX2
Influenza A by PCR: NEGATIVE
Influenza B by PCR: NEGATIVE
SARS Coronavirus 2 by RT PCR: NEGATIVE

## 2021-01-20 LAB — LACTIC ACID, PLASMA
Lactic Acid, Venous: 1.7 mmol/L (ref 0.5–1.9)
Lactic Acid, Venous: 1.6 mmol/L (ref 0.5–1.9)

## 2021-01-20 LAB — HEMOGLOBIN A1C
Hgb A1c MFr Bld: 5.7 % — ABNORMAL HIGH (ref 4.8–5.6)
Mean Plasma Glucose: 116.89 mg/dL

## 2021-01-20 LAB — COMPREHENSIVE METABOLIC PANEL
ALT: 49 U/L — ABNORMAL HIGH (ref 0–44)
AST: 65 U/L — ABNORMAL HIGH (ref 15–41)
Albumin: 2.9 g/dL — ABNORMAL LOW (ref 3.5–5.0)
Alkaline Phosphatase: 108 U/L (ref 38–126)
Anion gap: 13 (ref 5–15)
BUN: 26 mg/dL — ABNORMAL HIGH (ref 8–23)
CO2: 21 mmol/L — ABNORMAL LOW (ref 22–32)
Calcium: 9.4 mg/dL (ref 8.9–10.3)
Chloride: 93 mmol/L — ABNORMAL LOW (ref 98–111)
Creatinine, Ser: 0.9 mg/dL (ref 0.44–1.00)
GFR, Estimated: >60 mL/min (ref >60)
Glucose, Bld: 136 mg/dL — ABNORMAL HIGH (ref 70–99)
Potassium: 3.2 mmol/L — ABNORMAL LOW (ref 3.5–5.1)
Sodium: 127 mmol/L — ABNORMAL LOW (ref 135–145)
Total Bilirubin: 1.9 mg/dL — ABNORMAL HIGH (ref 0.3–1.2)
Total Protein: 5.7 g/dL — ABNORMAL LOW (ref 6.5–8.1)

## 2021-01-20 LAB — CBC WITH DIFFERENTIAL/PLATELET
Abs Immature Granulocytes: 0.08 10*3/uL — ABNORMAL HIGH (ref 0.00–0.07)
Basophils Absolute: 0 10*3/uL (ref 0.0–0.1)
Basophils Relative: 0 %
Eosinophils Absolute: 0 10*3/uL (ref 0.0–0.5)
Eosinophils Relative: 0 %
HCT: 40.8 % (ref 36.0–46.0)
Hemoglobin: 14.6 g/dL (ref 12.0–15.0)
Immature Granulocytes: 1 %
Lymphocytes Relative: 1 %
Lymphs Abs: 0.1 10*3/uL — ABNORMAL LOW (ref 0.7–4.0)
MCH: 30.7 pg (ref 26.0–34.0)
MCHC: 35.8 g/dL (ref 30.0–36.0)
MCV: 85.9 fL (ref 80.0–100.0)
Monocytes Absolute: 0.5 10*3/uL (ref 0.1–1.0)
Monocytes Relative: 4 %
Neutro Abs: 11.1 10*3/uL — ABNORMAL HIGH (ref 1.7–7.7)
Neutrophils Relative %: 94 %
Platelets: 212 10*3/uL (ref 150–400)
RBC: 4.75 MIL/uL (ref 3.87–5.11)
RDW: 12.6 % (ref 11.5–15.5)
WBC: 11.8 10*3/uL — ABNORMAL HIGH (ref 4.0–10.5)
nRBC: 0 % (ref 0.0–0.2)

## 2021-01-20 LAB — URINALYSIS, ROUTINE W REFLEX MICROSCOPIC
Bilirubin Urine: NEGATIVE
Glucose, UA: 50 mg/dL — AB
Ketones, ur: 20 mg/dL — AB
Nitrite: NEGATIVE
Protein, ur: 100 mg/dL — AB
Specific Gravity, Urine: 1.02 (ref 1.005–1.030)
pH: 5 (ref 5.0–8.0)

## 2021-01-20 LAB — D-DIMER, QUANTITATIVE: D-Dimer, Quant: 4.42 ug/mL-FEU — ABNORMAL HIGH (ref 0.00–0.50)

## 2021-01-20 LAB — TROPONIN I (HIGH SENSITIVITY)
Troponin I (High Sensitivity): 168 ng/L (ref <18)
Troponin I (High Sensitivity): 137 ng/L (ref <18)

## 2021-01-20 LAB — PROTIME-INR
INR: 1.1 (ref 0.8–1.2)
Prothrombin Time: 13.9 seconds (ref 11.4–15.2)

## 2021-01-20 LAB — PROCALCITONIN: Procalcitonin: 18.74 ng/mL

## 2021-01-20 MED ORDER — SODIUM CHLORIDE 0.9% FLUSH
3.0000 mL | Freq: Two times a day (BID) | INTRAVENOUS | Status: DC
  Administered 2021-01-20 – 2021-01-28 (×14): 3 mL via INTRAVENOUS

## 2021-01-20 MED ORDER — ONDANSETRON HCL 4 MG PO TABS: 4.0000 mg | ORAL_TABLET | Freq: Four times a day (QID) | ORAL | Status: DC | PRN

## 2021-01-20 MED ORDER — ENOXAPARIN SODIUM 40 MG/0.4ML IJ SOSY
40.0000 mg | PREFILLED_SYRINGE | INTRAMUSCULAR | Status: DC
  Administered 2021-01-21 – 2021-01-28 (×7): 40 mg via SUBCUTANEOUS
  Filled 2021-01-20 (×8): qty 0.4

## 2021-01-20 MED ORDER — SODIUM CHLORIDE 0.9 % IV SOLN
1.0000 g | INTRAVENOUS | Status: DC
  Administered 2021-01-21 – 2021-01-22 (×2): 1 g via INTRAVENOUS
  Filled 2021-01-20 (×2): qty 10

## 2021-01-20 MED ORDER — IOHEXOL 350 MG/ML SOLN
80.0000 mL | Freq: Once | INTRAVENOUS | Status: AC | PRN
  Administered 2021-01-20: 80 mL via INTRAVENOUS

## 2021-01-20 MED ORDER — SODIUM CHLORIDE 0.9 % IV SOLN
500.0000 mg | Freq: Once | INTRAVENOUS | Status: AC
  Administered 2021-01-20: 500 mg via INTRAVENOUS
  Filled 2021-01-20: qty 500

## 2021-01-20 MED ORDER — LACTATED RINGERS IV BOLUS
1000.0000 mL | Freq: Once | INTRAVENOUS | Status: AC
  Administered 2021-01-20: 1000 mL via INTRAVENOUS

## 2021-01-20 MED ORDER — POLYETHYLENE GLYCOL 3350 17 G PO PACK: 17.0000 g | PACK | Freq: Every day | ORAL | Status: DC | PRN

## 2021-01-20 MED ORDER — ACETAMINOPHEN 325 MG PO TABS
650.0000 mg | ORAL_TABLET | Freq: Four times a day (QID) | ORAL | Status: DC | PRN
  Administered 2021-01-20 – 2021-01-29 (×5): 650 mg via ORAL
  Filled 2021-01-20 (×5): qty 2

## 2021-01-20 MED ORDER — SODIUM CHLORIDE 0.9 % IV SOLN
1.0000 g | Freq: Once | INTRAVENOUS | Status: AC
  Administered 2021-01-20: 1 g via INTRAVENOUS
  Filled 2021-01-20: qty 10

## 2021-01-20 MED ORDER — LACTATED RINGERS IV BOLUS: 1000.0000 mL | Freq: Once | INTRAVENOUS | Status: DC

## 2021-01-20 MED ORDER — SODIUM CHLORIDE 0.9 % IV BOLUS
1000.0000 mL | Freq: Once | INTRAVENOUS | Status: AC
  Administered 2021-01-20: 1000 mL via INTRAVENOUS

## 2021-01-20 MED ORDER — SODIUM CHLORIDE 0.9 % IV SOLN
500.0000 mg | INTRAVENOUS | Status: AC
  Administered 2021-01-21 – 2021-01-22 (×2): 500 mg via INTRAVENOUS
  Filled 2021-01-20 (×2): qty 500

## 2021-01-20 MED ORDER — HYDRALAZINE HCL 20 MG/ML IJ SOLN
10.0000 mg | Freq: Four times a day (QID) | INTRAMUSCULAR | Status: DC | PRN
  Administered 2021-01-20 (×2): 10 mg via INTRAVENOUS
  Filled 2021-01-20 (×2): qty 1

## 2021-01-20 MED ORDER — ACETAMINOPHEN 325 MG PO TABS
650.0000 mg | ORAL_TABLET | Freq: Once | ORAL | Status: AC | PRN
  Administered 2021-01-20: 650 mg via ORAL
  Filled 2021-01-20: qty 2

## 2021-01-20 MED ORDER — LIDOCAINE 5 % EX PTCH
1.0000 | MEDICATED_PATCH | CUTANEOUS | Status: DC
  Administered 2021-01-21 – 2021-01-28 (×7): 1 via TRANSDERMAL
  Filled 2021-01-20 (×8): qty 1

## 2021-01-20 MED ORDER — IPRATROPIUM-ALBUTEROL 0.5-2.5 (3) MG/3ML IN SOLN
3.0000 mL | RESPIRATORY_TRACT | Status: DC
  Administered 2021-01-20 – 2021-01-21 (×5): 3 mL via RESPIRATORY_TRACT
  Filled 2021-01-20 (×5): qty 3

## 2021-01-20 MED ORDER — LEVOTHYROXINE SODIUM 25 MCG PO TABS
25.0000 ug | ORAL_TABLET | Freq: Every day | ORAL | Status: DC
  Administered 2021-01-21 – 2021-01-29 (×9): 25 ug via ORAL
  Filled 2021-01-20 (×9): qty 1

## 2021-01-20 MED ORDER — ONDANSETRON HCL 4 MG/2ML IJ SOLN: 4.0000 mg | Freq: Four times a day (QID) | INTRAMUSCULAR | Status: DC | PRN

## 2021-01-20 MED ORDER — ACETAMINOPHEN 650 MG RE SUPP: 650.0000 mg | Freq: Four times a day (QID) | RECTAL | Status: DC | PRN

## 2021-01-20 NOTE — ED Notes (Signed)
Pt oxygen saturations noted to be 85% with a good pleth. Placed pt on 2L [place] with no improvement. Increased oxygen to 4L [place] with improvement to 89%. Increased oxygen to 6L [place] with improvement to 95%. Pt remains tachypnic. MD made aware.

## 2021-01-20 NOTE — Progress Notes (Signed)
Pt b/p taken twice on arrival to unit  01/20/21 1732  Vitals  Temp 98.9 F (37.2 C)  Temp Source Oral  BP (!) 207/92  MAP (mmHg) 126  BP Location Right Arm  BP Method Automatic  Patient Position (if appropriate) Lying  Pulse Rate Source Monitor  ECG Heart Rate (!) 118  Resp (!) 26  MEWS COLOR  MEWS Score Color Red  Oxygen Therapy  SpO2 97 %  O2 Device Nasal Cannula  MEWS Score  MEWS Temp 0  MEWS Systolic 2  MEWS Pulse 2  MEWS RR 2  MEWS LOC 0  MEWS Score 6

## 2021-01-20 NOTE — ED Provider Notes (Addendum)
Emergency Medicine Provider Triage Evaluation Note  Courtney Castro , a 87 y.o. female  was evaluated in triage.  Pt complains of multiple falls. Sent by daughter via EMS. Patient with multiple falls over past few weeks with generalized weakness. On arrival patient febrile to 103, tachycardia 112, she is tachypneic on exam. She only endorses back pain from fall earlier this week and right sided flank pain that she relates to fall. Denies pain, chest pain or shortness of breath. Denies urinary symptoms.   Review of Systems  Positive: Falls, fever Negative: Chest pain, shortness of breath, headache  Physical Exam  BP (!) 159/85 (BP Location: Left Arm)   Pulse (!) 112   Temp (!) 103 F (39.4 C) (Oral)   Resp (!) 26   SpO2 91%  Gen:   Awake, moderate distress with fever and tachypnea, warm to touch  Cardiac: S1/S2, tachycardia without obvious murmur. Pulses equal bilaterally.  Resp:  Tachypnea, rhonchi in left chest, clear on the right.  Abdomen: Soft and non-tender, non-distended  MSK:   Moves extremities without difficulty  Other:  Oropharynx is dry. No focal neurological deficits on exam.  Medical Decision Making  Medically screening exam initiated at 9:51 AM.  Appropriate orders placed.  Courtney Castro was informed that the remainder of the evaluation will be completed by another provider, this initial triage assessment does not replace that evaluation, and the importance of remaining in the ED until their evaluation is complete.  Nursing aware patient needs expedited room    Mickie Hillier, PA-C 01/20/21 0955    Mickie Hillier, PA-C 01/20/21 7414    Regan Lemming, MD 01/20/21 1048

## 2021-01-20 NOTE — Progress Notes (Signed)
Paged by RN that systolic BP in the 585F, despite giving IV hydralazine 10 mg. Gave another dose of this, and BP was still consistently in the 200s. Dr. Collene Gobble and I evaluated the patient at the bedside. She states that she is not having any chest pain currently, but she is having palpitations. She also feels that her breathing is labored, despite being on 6 L [place]. She states that other than that, she is starting to feel better compared to when she first presented to the ED. D dimer this morning was markedly elevated at 4.4 and troponins were initially elevated on admission, as well. CTA was ordered to rule out PE, however, this has not been done as the patient has not had appropriate IV access. Of note, the patient recently traveled on a plane to and from [place] (returned about 6 days ago). She denies any recent surgeries or immobilization.  She has no previous history of blood clots and is not on any blood thinners.    Physical exam: Vitals: HR: 140s  BP: 201/106 RR: 33-37 SpO2: 95% on 6 L [place]   Constitutional: ill appearing female lying in bed Cardio: Tachycardic rate, regular rhythm Pulm: Tachypnea present. Rhonchi and wheezing present most notably in lower lung fields  MSK: No lower extremity edema    Plan: Spoke with RN and patient's IV access will be switched so she can get CTA to rule out PE.  - Repeat EKG - CTA chest STAT  - Duonebs q4h while awake    Buddy Duty, DO PGY-1, Internal Medicine Resident [phone]

## 2021-01-20 NOTE — Progress Notes (Signed)
Received pt on unit  

## 2021-01-20 NOTE — ED Provider Notes (Addendum)
[facility] EMERGENCY DEPARTMENT Provider Note   CSN: [id number] Arrival date & time: 01/20/21  0092     History No chief complaint on file.   Courtney Castro is a 87 y.o. female.  HPI  87 year old female with a history of breast cancer, HTN, HLD, subarachnoid hemorrhage and subdural hematoma with no residual deficits, basal cell carcinoma presenting to the emergency department with multiple falls over the past few weeks and generalized weakness.  The patient endorses some right-sided back pain/flank pain that she attributed to her fall.  Pain is sharp and nonradiating, no alleviating factors.  Moderate in intensity.  She initially denied any chest pain or shortness of breath.  She denied any dysuria or increased urinary frequency.  She denied any fevers or chills.  She just endorsed solely a generalized sensation of weakness with back pain close to her right chest.  No pleuritic component to the pain.  On arrival, the patient was febrile to 103, tachycardic to 112.  Septic work-up initiated on patient arrival.  Past Medical History:  Diagnosis Date   Allergy    seasonal allergies   Basal cell carcinoma 2007, 2015, 2017   face and shoulder (Dr. Derrel Nip)   2017-chin   [place] (basal cell carcinoma), leg, right 01/28/2018   right thigh-Dr Stinehelfer   Breast cancer [facility]) 1989   R breast-microinvasive papillary(lumpectomy and XRT)   Colon polyp 09/2000   Diverticulosis    seen on colonoscopy   Elevated liver function tests 06/2009   galllstones and dilated CBD--s/p ERCP and chole   Family history of breast cancer    Genetic testing 12/20/2016   Multi-Cancer panel (83 genes) @ [facility] - CHEK2 mutation   Gilbert syndrome    Hyperlipidemia    Hypertension    Hyperthyroidism    IBS (irritable bowel syndrome) 1984   Macular degeneration 2008   mild   Radial head fracture 02/2014   left, nondisplaced   Subarachnoid hemorrhage ([place]) 02/2014   after fall    Subdural hematoma 06/2008   Dr.Cram   Subdural hematoma 02/2014   after fall (Dr. Vertell Limber)    Patient Active Problem List   Diagnosis Date Noted   Sepsis ([place]) 01/20/2021   History of breast cancer 05/22/2018   Genetic testing 12/20/2016   Family history of breast cancer    Subarachnoid hemorrhage ([place]) 02/26/2014   Shoulder pain 05/26/2012   Impaired fasting glucose 02/19/2012   Essential hypertension, benign 01/31/2011   Hypothyroidism 01/31/2011   Mixed hyperlipidemia 01/31/2011   Allergic rhinitis 01/31/2011    Past Surgical History:  Procedure Laterality Date   ABDOMINAL HYSTERECTOMY     bladder repair   BREAST LUMPECTOMY Right 1989   right breast   CATARACT EXTRACTION, BILATERAL Bilateral June and August 2020   Dr. Kathrin Penner   ERCP  07/2009   sphincterotomy   fracture left foot  01/2007   5th metatarsal-treated with boot   HAMMER TOE SURGERY Right 05/2004   right foot   LAPAROSCOPIC CHOLECYSTECTOMY  07/2009   TOTAL KNEE ARTHROPLASTY Right 2000   TOTAL KNEE ARTHROPLASTY Left 2004   Dr. Wynelle Link   TUBAL LIGATION       OB History     Gravida  2   Para  2   Term      Preterm      AB      Living  2      SAB      IAB  Ectopic      Multiple      Live Births              Family History  Problem Relation Age of Onset   Leukemia Mother        deceased 89; TAH/BSO in 20s   Coronary artery disease Father    Hypertension Father    Heart disease Father        onset late 51's   Coronary artery disease Brother 95       coronary artery bypass grafting, pacemaker   Heart disease Brother        CABG in 60's, pacemaker   Diabetes Paternal Uncle    Diabetes Paternal Grandmother    Breast cancer Daughter 65   Fibroids Daughter    Breast cancer Maternal Aunt 40       deceased 71s   Breast cancer Cousin 68       daughter of mat aunt with BC at 81; currently 3   Cancer Maternal Uncle        dx 83s; unk. type   Breast cancer Maternal Aunt         dx 23s; deceased 51s    Social History   Tobacco Use   Smoking status: Former    Packs/day: 0.10    Years: 3.00    Pack years: 0.30    Types: Cigarettes    Quit date: 03/27/1963    Years since quitting: 57.8   Smokeless tobacco: Never  Vaping Use   Vaping Use: Never used  Substance Use Topics   Alcohol use: Yes    Alcohol/week: 0.0 standard drinks    Comment: 1-2 glasses of wine/week   Drug use: No    Home Medications Prior to Admission medications   Medication Sig Start Date End Date Taking? Authorizing Provider  acetaminophen (TYLENOL) 500 MG tablet Take 1,000 mg by mouth every 6 (six) hours as needed.   Yes [provider]  atenolol (TENORMIN) 25 MG tablet Take 1 tablet (25 mg total) by mouth daily. 11/30/20  Yes Rita Ohara, MD  Calcium Carbonate (CALTRATE 600 PO) Take 1 tablet by mouth daily.   Yes [provider]  Cholecalciferol (VITAMIN D-3) 25 MCG (1000 UT) CAPS Take 1,000 Units by mouth daily.   Yes [provider]  fexofenadine (ALLEGRA) 180 MG tablet Take 180 mg by mouth daily.   Yes [provider]  GLUCOSAMINE PO Take 2,000 mg by mouth 2 (two) times daily.   Yes [provider]  losartan-hydrochlorothiazide (HYZAAR) 100-25 MG tablet Take 1 tablet by mouth daily. 11/30/20  Yes Rita Ohara, MD  Multiple Vitamins-Minerals (CENTRUM SILVER PO) Take 1 tablet by mouth daily.   Yes [provider]  Multiple Vitamins-Minerals (PRESERVISION/LUTEIN) CAPS Take 1 capsule by mouth 2 (two) times daily.   Yes [provider]  simvastatin (ZOCOR) 20 MG tablet Take 1 tablet (20 mg total) by mouth at bedtime. 05/30/20  Yes Rita Ohara, MD  fluticasone (FLONASE) 50 MCG/ACT nasal spray SPRAY TWO SPRAYS IN [facility] NOSTRIL ONCE DAILY Patient not taking: No sig reported 11/30/20   Rita Ohara, MD  levothyroxine (SYNTHROID) 25 MCG tablet Take 1 tablet (25 mcg total) by mouth daily. Patient not taking: No sig reported 12/01/20   Rita Ohara,  MD    Allergies    Percocet [oxycodone-acetaminophen]  Review of Systems   Review of Systems  Constitutional:  Positive for fatigue. Negative for chills and fever.  HENT:  Negative for ear pain and sore throat.   Eyes:  Negative for pain and visual disturbance.  Respiratory:  Negative for cough and shortness of breath.   Cardiovascular:  Negative for chest pain and palpitations.  Gastrointestinal:  Negative for abdominal pain and vomiting.  Genitourinary:  Negative for dysuria and hematuria.  Musculoskeletal:  Positive for back pain. Negative for arthralgias.  Skin:  Negative for color change and rash.  Neurological:  Positive for weakness. Negative for seizures and syncope.  All other systems reviewed and are negative.  Physical Exam Updated Vital Signs BP (!) 188/107   Pulse 93   Temp 98.9 F (37.2 C) (Oral)   Resp (!) 25   SpO2 97%   Physical Exam Vitals and nursing note reviewed.  Constitutional:      General: She is not in acute distress.    Appearance: She is well-developed. She is ill-appearing.     Comments: GCS 15, ABC intact  HENT:     Head: Normocephalic and atraumatic.  Eyes:     Extraocular Movements: Extraocular movements intact.     Conjunctiva/sclera: Conjunctivae normal.     Pupils: Pupils are equal, round, and reactive to light.  Neck:     Comments: No midline tenderness to palpation of the cervical spine.  Range of motion intact Cardiovascular:     Rate and Rhythm: Regular rhythm. Tachycardia present.     Pulses: Normal pulses.     Heart sounds: No murmur heard. Pulmonary:     Effort: Pulmonary effort is normal. No respiratory distress.     Breath sounds: Rhonchi present.     Comments: Rhonchi noted in the bilateral lower lung fields Chest:     Comments: Clavicles stable nontender to AP compression.  Chest wall stable and nontender to AP and lateral compression. Abdominal:     General: There is no distension.     Palpations: Abdomen is soft.      Tenderness: There is no abdominal tenderness. There is no guarding.  Musculoskeletal:        General: No deformity or signs of injury.     Cervical back: Normal range of motion and neck supple.     Comments: No midline tenderness to palpation of the thoracic or lumbar spine.  Extremities atraumatic with intact range of motion  Skin:    General: Skin is warm and dry.     Findings: No lesion or rash.  Neurological:     General: No focal deficit present.     Mental Status: She is alert and oriented to person, place, and time. Mental status is at baseline.     Comments: Cranial nerves II through XII grossly intact.  Moving all 4 extremities spontaneously.  Sensation grossly intact all 4 extremities    ED Results / Procedures / Treatments   Labs (all labs ordered are listed, but only abnormal results are displayed) Labs Reviewed  COMPREHENSIVE METABOLIC PANEL - Abnormal; Notable for the following components:      Result Value   Sodium 127 (*)    Potassium 3.2 (*)    Chloride 93 (*)    CO2 21 (*)    Glucose, Bld 136 (*)    BUN 26 (*)    Total Protein 5.7 (*)    Albumin 2.9 (*)    AST 65 (*)    ALT 49 (*)    Total Bilirubin 1.9 (*)    All other components within normal limits  CBC WITH DIFFERENTIAL/PLATELET - Abnormal;  Notable for the following components:   WBC 11.8 (*)    Neutro Abs 11.1 (*)    Lymphs Abs 0.1 (*)    Abs Immature Granulocytes 0.08 (*)    All other components within normal limits  URINALYSIS, ROUTINE W REFLEX MICROSCOPIC - Abnormal; Notable for the following components:   APPearance HAZY (*)    Glucose, UA 50 (*)    Hgb urine dipstick SMALL (*)    Ketones, ur 20 (*)    Protein, ur 100 (*)    Leukocytes,Ua SMALL (*)    Bacteria, UA MANY (*)    All other components within normal limits  D-DIMER, QUANTITATIVE - Abnormal; Notable for the following components:   D-Dimer, Quant 4.42 (*)    All other components within normal limits  HEMOGLOBIN A1C - Abnormal;  Notable for the following components:   Hgb A1c MFr Bld 5.7 (*)    All other components within normal limits  TROPONIN I (HIGH SENSITIVITY) - Abnormal; Notable for the following components:   Troponin I (High Sensitivity) 168 (*)    All other components within normal limits  TROPONIN I (HIGH SENSITIVITY) - Abnormal; Notable for the following components:   Troponin I (High Sensitivity) 137 (*)    All other components within normal limits  CULTURE, BLOOD (ROUTINE X 2)  RESP PANEL BY RT-PCR (FLU A&B, COVID) ARPGX2  CULTURE, BLOOD (ROUTINE X 2)  URINE CULTURE  RESPIRATORY PANEL BY PCR  LACTIC ACID, PLASMA  LACTIC ACID, PLASMA  PROTIME-INR  PROCALCITONIN  STREP PNEUMONIAE URINARY ANTIGEN  LEGIONELLA PNEUMOPHILA SEROGP 1 UR AG  COMPREHENSIVE METABOLIC PANEL  CBC WITH DIFFERENTIAL/PLATELET    EKG EKG Interpretation  Date/Time:  Friday January 20 2021 09:33:31 EDT Ventricular Rate:  108 PR Interval:  182 QRS Duration: 80 QT Interval:  300 QTC Calculation: 402 R Axis:   16 Text Interpretation: Sinus tachycardia with Premature atrial complexes Possible Left atrial enlargement Nonspecific ST and T wave abnormality Abnormal ECG Confirmed by Regan Lemming ([id number]) on 01/20/2021 11:47:27 AM  Radiology DG Chest 2 View  Result Date: 01/20/2021 CLINICAL DATA:  Sepsis EXAM: CHEST - 2 VIEW COMPARISON:  Chest x-ray dated April 5th 2010 FINDINGS: Cardiac and mediastinal contours within normal limits. Consolidations of the left lower lobe and bilateral upper lobes. Possible small left pleural effusion. No evidence of pneumothorax. IMPRESSION: Consolidations of the left lower lobe and bilateral upper lobes, concerning for multifocal infection. Recommend follow-up in 4-6 weeks to ensure resolution. Electronically Signed   By: Yetta Glassman M.D.   On: 01/20/2021 10:17   CT HEAD WO CONTRAST (5MM)  Result Date: 01/20/2021 CLINICAL DATA:  Multiple recent falls, altered mental status EXAM: CT HEAD  WITHOUT CONTRAST TECHNIQUE: Contiguous axial images were obtained from the base of the skull through the vertex without intravenous contrast. COMPARISON:  07/17/2019 FINDINGS: Brain: No evidence of acute infarction, hemorrhage, hydrocephalus, extra-axial collection or mass lesion/mass effect. Periventricular and deep white matter hypodensity. Vascular: No hyperdense vessel or unexpected calcification. Skull: Normal. Negative for fracture or focal lesion. Sinuses/Orbits: No acute finding. Other: None. IMPRESSION: No acute intracranial pathology. Small-vessel white matter disease. Electronically Signed   By: Delanna Ahmadi M.D.   On: 01/20/2021 11:00    Procedures Procedures   Medications Ordered in ED Medications  enoxaparin (LOVENOX) injection 40 mg (has no administration in time range)  sodium chloride flush (NS) 0.9 % injection 3 mL (3 mLs Intravenous Not Given 01/20/21 1339)  acetaminophen (TYLENOL) tablet 650 mg (has  no administration in time range)    Or  acetaminophen (TYLENOL) suppository 650 mg (has no administration in time range)  polyethylene glycol (MIRALAX / GLYCOLAX) packet 17 g (has no administration in time range)  ondansetron (ZOFRAN) tablet 4 mg (has no administration in time range)    Or  ondansetron (ZOFRAN) injection 4 mg (has no administration in time range)  azithromycin (ZITHROMAX) 500 mg in sodium chloride 0.9 % 250 mL IVPB (has no administration in time range)  cefTRIAXone (ROCEPHIN) 1 g in sodium chloride 0.9 % 100 mL IVPB (has no administration in time range)  levothyroxine (SYNTHROID) tablet 25 mcg (has no administration in time range)  lactated ringers bolus 1,000 mL (has no administration in time range)  lidocaine (LIDODERM) 5 % 1 patch (has no administration in time range)  hydrALAZINE (APRESOLINE) injection 10 mg (10 mg Intravenous Given 01/20/21 1851)  acetaminophen (TYLENOL) tablet 650 mg (650 mg Oral Given 01/20/21 0949)  sodium chloride 0.9 % bolus 1,000 mL  (0 mLs Intravenous Stopped 01/20/21 1459)  cefTRIAXone (ROCEPHIN) 1 g in sodium chloride 0.9 % 100 mL IVPB (0 g Intravenous Stopped 01/20/21 1247)  azithromycin (ZITHROMAX) 500 mg in sodium chloride 0.9 % 250 mL IVPB (0 mg Intravenous Stopped 01/20/21 1458)  lactated ringers bolus 1,000 mL (1,000 mLs Intravenous New Bag/Given 01/20/21 1247)    ED Course  I have reviewed the triage vital signs and the nursing notes.  Pertinent labs & imaging results that were available during my care of the patient were reviewed by me and considered in my medical decision making (see chart for details).    MDM Rules/Calculators/A&P                            87 year old female with a history of breast cancer, HTN, HLD, subarachnoid hemorrhage and subdural hematoma with no residual deficits, basal cell carcinoma presenting to the emergency department with multiple falls over the past few weeks and generalized weakness.  The patient endorses some right-sided back pain/flank pain that she attributed to her fall.  She initially denied any chest pain or shortness of breath.  She denied any dysuria or increased urinary frequency.  She denied any fevers or chills.  She just endorsed solely a generalized sensation of weakness with back pain close to her right chest.  No pleuritic component to the pain.  On arrival, the patient was febrile to 103, tachycardic to 112.  Septic work-up initiated on patient arrival.  An EKG revealed sinus tachycardia without ST segment elevations.  Differential diagnosis is broad and includes sepsis (source pulmonary, urinary, abdominal considered), viral pneumonia, COVID-19, influenza, PE, less likely ACS.  Less likely nephrolithiasis, cholecystitis/cholelithiasis.  Initial work-up was concerning for an infectious etiology with a leukocytosis to 11.8, tachycardia, fever to 103.  Given her recent falls, CT head had been performed in first look which was negative.  A chest x-ray was performed  which revealed multifocal opacities concerning for pneumonia.  The patient was started on broad-spectrum antibiotics.  She was fluid resuscitated.  Her O2 saturations dropped while in the emergency department and she was started on oxygen.  A CMP revealed hyponatremia to 127, hypokalemia to 3.2, AST 65, ALT 49, T bili 1.9.  COVID-19 and influenza PCR testing resulted negative.  Lactic acid was also normal at 1.7.  Internal medicine service was consulted for admission.  Prior to admission, delta troponins were ordered and pending.  Internal medicine service  ordered a D-dimer which was markedly elevated at 12.4 and a CTA PE study was ordered.  Remainder of MDM per the inpatient team. Discussed the possibility of pulmonary embolism with the admitting team who agrees and plans to follow-up CTA imaging. Patient was subsequently admitted in stable condition.  Final Clinical Impression(s) / ED Diagnoses Final diagnoses:  Sepsis, due to unspecified organism, unspecified whether acute organ dysfunction present [facility])  Community acquired pneumonia, unspecified laterality  Elevated troponin  Positive D dimer    Rx / DC Orders ED Discharge Orders     None        Regan Lemming, MD 01/20/21 Yevette Edwards    Regan Lemming, MD 01/20/21 [id number]    Regan Lemming, MD 01/20/21 1859

## 2021-01-20 NOTE — Progress Notes (Signed)
Paged on call attending @[phone]   regarding pt b/p 207/92 Pt presents to be in acute distress,  retractable breathing noted, some grunting, tachycardic @ 120.  MD is aware

## 2021-01-20 NOTE — ED Triage Notes (Signed)
Patient BIB [facility] from home called by daughter for multiple falls over the last few weeks and increased generalized weakness. No fall today, is not anticoagulated. Patient denies pain, denies shortness of breath. EMS reports daughter at home stated the patient had been evaluated after each of the previous falls. EMS also reports a brief episode during transport from approximately 0915 to 0917 that she appeared to have difficulty finding the words she wanted to speak, this episode resolved spontaneously. Patient alert oriented, and in no apparent distress at this time.

## 2021-01-20 NOTE — H&P (Signed)
Date: 01/20/2021               Patient Name:  Courtney Castro MRN: [id number]  DOB: January 19, 1934 Age / Sex: 87 y.o., female   PCP: Rita Ohara, MD         Medical Service: Internal Medicine Teaching Service         Attending Physician: Dr. Sid Falcon, MD    First Contact: Dr. Humphrey Rolls Pager: [phone]  Second Contact: Dr. Johnney Ou Pager: [phone]       After Hours (After 5p/  First Contact Pager: [phone]  weekends / holidays): Second Contact Pager: [phone]   Chief Complaint: Lucky Rathke and SOB  History of Present Illness:   Courtney Castro is an 87 year old female with a past medical history of hypertension, hypothyroidism, breast cancer in remission and intracranial bleed due to falls who presents to the ED with complaints of malaise and shortness of breath.  Courtney Castro states that she recently went to [place] to a spa last week and returned on Thursday, October 20.  On return, she was not feeling well and was experiencing generalized weakness. She states that she had one fall while in [place] which was minimal, with only " a couple scrapes and scratches."  The day after she returned home, she fell while in the bathroom after her feet got tangled in her pants and she fell back into the toilet tank and ended up breaking it.  After this, she has noticed progressively worsening malaise and generalized weakness in addition to dyspnea at rest and with exertion.  Her daughter is at bedside and assisted with history.  Her daughter states that Courtney Castro has been feeling warm over the last couple days, however patient denies any known fevers.  She denies any chest pain, arm pain, neck pain, nasal congestion, cough, sore throat, nausea, vomiting, diarrhea, or focal weakness.  Courtney Castro denies any previous history of pneumonia, lung disease including COPD or asthma, or any known blood clots in the past.  She denies any known sick contacts during her travels in the last 1 to 2  weeks.  ED course: On arrival to the ED, patient was noted to be febrile at 103.  She had a blood pressure 159/84 with a heart rate of 112 and respiratory rate of 26.  She was saturating at 91% on room air.  Initial CBC remarkable for elevated white blood count at 11.8.  Initial CMP remarkable for hyponatremia at 127, hypokalemia 3.2, AST of 65, ALT of 49 and total bili of 1.9.  COVID-19 and flu swab negative.  Lactic acid normal at 1.7.  Chest x-ray was obtained that showed consolidations of the left lower lobe and bilateral upper lobes consistent with multifocal infection.  CT head was negative.  Patient was started on ceftriaxone and azithromycin for presumed community-acquired pneumonia.  IMTS was consulted for admission.  Meds:  Current Meds  Medication Sig   acetaminophen (TYLENOL) 500 MG tablet Take 1,000 mg by mouth every 6 (six) hours as needed.   atenolol (TENORMIN) 25 MG tablet Take 1 tablet (25 mg total) by mouth daily.   Calcium Carbonate (CALTRATE 600 PO) Take 1 tablet by mouth daily.   Cholecalciferol (VITAMIN D-3) 25 MCG (1000 UT) CAPS Take 1,000 Units by mouth daily.   fexofenadine (ALLEGRA) 180 MG tablet Take 180 mg by mouth daily.   GLUCOSAMINE PO Take 2,000 mg by mouth 2 (two) times daily.   losartan-hydrochlorothiazide (HYZAAR) 100-25 MG  tablet Take 1 tablet by mouth daily.   Multiple Vitamins-Minerals (CENTRUM SILVER PO) Take 1 tablet by mouth daily.   Multiple Vitamins-Minerals (PRESERVISION/LUTEIN) CAPS Take 1 capsule by mouth 2 (two) times daily.   simvastatin (ZOCOR) 20 MG tablet Take 1 tablet (20 mg total) by mouth at bedtime.   Allergies: Allergies as of 01/20/2021 - Review Complete 01/20/2021  Allergen Reaction Noted   Percocet [oxycodone-acetaminophen] Other (See Comments) 01/31/2011   Past Medical History:  Diagnosis Date   Allergy    seasonal allergies   Basal cell carcinoma 2007, 2015, 2017   face and shoulder (Dr. Derrel Nip)   2017-chin   [place] (basal  cell carcinoma), leg, right 01/28/2018   right thigh-Dr Stinehelfer   Breast cancer [facility]) 1989   R breast-microinvasive papillary(lumpectomy and XRT)   Colon polyp 09/2000   Diverticulosis    seen on colonoscopy   Elevated liver function tests 06/2009   galllstones and dilated CBD--s/p ERCP and chole   Family history of breast cancer    Genetic testing 12/20/2016   Multi-Cancer panel (83 genes) @ [facility] - CHEK2 mutation   Gilbert syndrome    Hyperlipidemia    Hypertension    Hyperthyroidism    IBS (irritable bowel syndrome) 1984   Macular degeneration 2008   mild   Radial head fracture 02/2014   left, nondisplaced   Subarachnoid hemorrhage ([place]) 02/2014   after fall   Subdural hematoma 06/2008   Dr.Cram   Subdural hematoma 02/2014   after fall (Dr. Vertell Limber)   Family History:  Family History  Problem Relation Age of Onset   Leukemia Mother        deceased 52; TAH/BSO in 26s   Coronary artery disease Father    Hypertension Father    Heart disease Father        onset late 24's   Coronary artery disease Brother 31       coronary artery bypass grafting, pacemaker   Heart disease Brother        CABG in 60's, pacemaker   Diabetes Paternal Uncle    Diabetes Paternal Grandmother    Breast cancer Daughter 60   Fibroids Daughter    Breast cancer Maternal Aunt 40       deceased 19s   Breast cancer Cousin 30       daughter of mat aunt with BC at 16; currently 34   Cancer Maternal Uncle        dx 102s; unk. 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  She is currently retired -  She is widowed, with her husband recently passing from Lewy body dementia approximately 6-7 months ago - She lives alone and is independent in her ADLs, including managing her own medications. - She denies any previous use of tobacco, alcohol or drugs (although she  mentions that she socially smoked tobacco and drink when she was in her 77s) - She has 2 daughters, 1 who lives nearby and 1 who lives in [place].  Review of Systems: A complete ROS was negative except as per HPI.   Physical Exam: Blood pressure 136/73, pulse 97, temperature 98.1 F (36.7 C), temperature source Oral, resp. rate (!) 23, SpO2 97 %.  Physical Exam Vitals and nursing note reviewed.  Constitutional:      Appearance: She  is normal weight. She is ill-appearing. She is not diaphoretic.  HENT:     Head: Normocephalic and atraumatic.     Mouth/Throat:     Mouth: Mucous membranes are dry.     Pharynx: Oropharynx is clear.  Eyes:     General: No scleral icterus.    Extraocular Movements: Extraocular movements intact.     Conjunctiva/sclera: Conjunctivae normal.     Pupils: Pupils are equal, round, and reactive to light.  Cardiovascular:     Rate and Rhythm: Normal rate and regular rhythm.     Heart sounds: No murmur heard. Pulmonary:     Effort: Tachypnea present. No accessory muscle usage, prolonged expiration or respiratory distress.     Breath sounds: Rhonchi present. No wheezing.     Comments: Rhonchi present in the bilateral lower lung fields, left mid lung field in bilateral upper lung fields. Abdominal:     General: Bowel sounds are normal. There is no distension.     Palpations: Abdomen is soft.     Tenderness: There is no abdominal tenderness. There is no guarding.  Musculoskeletal:     Cervical back: Neck supple.     Thoracic back: Spasms (Muscle spasm on the right paraspinal thoracic region) and tenderness present.     Lumbar back: No swelling, edema, deformity or signs of trauma.     Right lower leg: No edema.     Left lower leg: No edema.  Skin:    General: Skin is warm and dry.     Findings: Ecchymosis (Right lower shin) present.  Neurological:     General: No focal deficit present.     Mental Status: She is alert and oriented to person, place, and time.      Motor: No weakness.  Psychiatric:        Mood and Affect: Mood normal.        Behavior: Behavior normal.        Thought Content: Thought content normal.        Judgment: Judgment normal.   EKG: personally reviewed my interpretation is sinus tachycardia with a rate of 108.  T wave inversion in lead III, however no other signs of acute ischemia.  CXR: personally reviewed my interpretation is: Multifocal opacities more predominant on the left.  Small bilateral pleural effusions.  Assessment & Plan by Problem: Active Problems:   Sepsis [facility])  Courtney Castro is an 87 year old female with a past medical history of hypertension and hypothyroidism who is currently admitted for sepsis likely secondary to community-acquired pneumonia.  # Sepsis 2/2 Community Acquired Pneumonia  Patient presenting with tachycardia, tachypnea, fever and elevated white blood count with opacities consistent with multifocal pneumonia seen on chest x-ray concerning for community-acquired pneumonia.  Given multifocal nature, viral pneumonia initially on the differential however procalcitonin is markedly elevated.  Etiology may be Legionella pneumonia given patient recent stay at a spa in [place] and is now presenting with hyponatremia.  Patient was started on CAP coverage with azithromycin and ceftriaxone.  Of note, patient urinalysis shows small leukocytes and many bacteria, however patient is denying any urinary symptoms at this time so I do not suspect this is contributory.  - Continue supplemental oxygen to maintain oxygen saturation above 90% - Continue Azithromycin and Rocephin, Day 1/5 - Follow blood cultures - Obtain Legionella and strep pneumo urinary antigens - Obtain full RVP  # Demand Ischemia  Troponins obtained in the ED were markedly elevated at 168 however downtrending to 137.  This is out of proportion with her sepsis picture and so D-dimer was obtained and markedly elevated at 4.4.  Will order CTA  to rule out PE.  - CTA pending  # Hypovolemic Hyponatremia - Fluid resuscitation with LR  # Hypothyroidism - Resume home Synthroid  # Multiple Falls Recent falls likely secondary to acute illness, however will have PT/OT work with patient.  - PT/OT consult - Lidocaine patch for back pain  Diet: Normal VTE: Enoxaparin IVF: LR,Bolus Code: DNR/DNI.  Discussed CODE STATUS with Courtney Castro and her daughter at bedside.  She states that the health decline of her husband during the last year placed a significant strain on her daughters and she would not want to be a similar burden.   Prior to Admission Living Arrangement: Home, living alone Anticipated Discharge Location:  TBD Barriers to Discharge: Medical stabilization  Dispo: Admit patient to Observation with expected length of stay less than 2 midnights.  Signed: Dr. Jose Persia Internal Medicine PGY-3 Pager: [phone] After 5pm on weekdays and 1pm on weekends: On Call pager [phone]  01/20/2021, 3:11 PM

## 2021-01-20 NOTE — Progress Notes (Signed)
   01/20/21 1735  Vitals  Temp 98.8 F (37.1 C)  BP (!) 208/82  MAP (mmHg) 130  BP Location Right Arm  BP Method Automatic  Patient Position (if appropriate) Lying  Pulse Rate Source Monitor  ECG Heart Rate (!) 115  Resp (!) 25  Level of Consciousness  Level of Consciousness Alert  MEWS COLOR  MEWS Score Color Red  Oxygen Therapy  SpO2 97 %  O2 Device Nasal Cannula  Pain Assessment  Pain Scale 0-10  Pain Score 0  PCA/Epidural/Spinal Assessment  Respiratory Pattern Labored  Height and Weight  Height 5\' 4"  (1.626 m)  Weight 63.3 kg  Type of Scale Used Bed  BSA (Calculated - sq m) 1.69 sq meters  BMI (Calculated) 23.94  Weight in (lb) to have BMI = 25 145.3  Glasgow Coma Scale  Eye Opening 4  Best Verbal Response (NON-intubated) 5  Best Motor Response 6  Glasgow Coma Scale Score 15  MEWS Score  MEWS Temp 0  MEWS Systolic 2  MEWS Pulse 2  MEWS RR 1  MEWS LOC 0  MEWS Score 5  Provider Notification  Provider Name/Title Alain Marion  Date Provider Notified 01/20/21  Time Provider Notified 1753  Notification Type Page  Notification Reason Change in status  Provider response See new orders;At bedside  Date of Provider Response 01/20/21  Time of Provider Response 1800

## 2021-01-20 NOTE — ED Notes (Signed)
Admitting MD paged regarding elevated troponin levels.

## 2021-01-21 ENCOUNTER — Observation Stay (HOSPITAL_COMMUNITY): Payer: Medicare Other

## 2021-01-21 ENCOUNTER — Inpatient Hospital Stay (HOSPITAL_COMMUNITY): Payer: Medicare Other

## 2021-01-21 DIAGNOSIS — R778 Other specified abnormalities of plasma proteins: Secondary | ICD-10-CM | POA: Diagnosis not present

## 2021-01-21 DIAGNOSIS — J154 Pneumonia due to other streptococci: Secondary | ICD-10-CM | POA: Diagnosis present

## 2021-01-21 DIAGNOSIS — H3530 Unspecified macular degeneration: Secondary | ICD-10-CM | POA: Diagnosis present

## 2021-01-21 DIAGNOSIS — J302 Other seasonal allergic rhinitis: Secondary | ICD-10-CM | POA: Diagnosis present

## 2021-01-21 DIAGNOSIS — E871 Hypo-osmolality and hyponatremia: Secondary | ICD-10-CM | POA: Diagnosis present

## 2021-01-21 DIAGNOSIS — A419 Sepsis, unspecified organism: Principal | ICD-10-CM

## 2021-01-21 DIAGNOSIS — R7989 Other specified abnormal findings of blood chemistry: Secondary | ICD-10-CM | POA: Diagnosis not present

## 2021-01-21 DIAGNOSIS — R0602 Shortness of breath: Secondary | ICD-10-CM

## 2021-01-21 DIAGNOSIS — I471 Supraventricular tachycardia: Secondary | ICD-10-CM | POA: Diagnosis not present

## 2021-01-21 DIAGNOSIS — G9340 Encephalopathy, unspecified: Secondary | ICD-10-CM

## 2021-01-21 DIAGNOSIS — Z66 Do not resuscitate: Secondary | ICD-10-CM | POA: Diagnosis present

## 2021-01-21 DIAGNOSIS — R918 Other nonspecific abnormal finding of lung field: Secondary | ICD-10-CM | POA: Diagnosis not present

## 2021-01-21 DIAGNOSIS — Z20822 Contact with and (suspected) exposure to covid-19: Secondary | ICD-10-CM | POA: Diagnosis present

## 2021-01-21 DIAGNOSIS — I248 Other forms of acute ischemic heart disease: Secondary | ICD-10-CM | POA: Diagnosis present

## 2021-01-21 DIAGNOSIS — G9341 Metabolic encephalopathy: Secondary | ICD-10-CM | POA: Diagnosis present

## 2021-01-21 DIAGNOSIS — M7989 Other specified soft tissue disorders: Secondary | ICD-10-CM

## 2021-01-21 DIAGNOSIS — Z853 Personal history of malignant neoplasm of breast: Secondary | ICD-10-CM | POA: Diagnosis not present

## 2021-01-21 DIAGNOSIS — B9620 Unspecified Escherichia coli [E. coli] as the cause of diseases classified elsewhere: Secondary | ICD-10-CM | POA: Diagnosis present

## 2021-01-21 DIAGNOSIS — E861 Hypovolemia: Secondary | ICD-10-CM | POA: Diagnosis present

## 2021-01-21 DIAGNOSIS — G9389 Other specified disorders of brain: Secondary | ICD-10-CM | POA: Diagnosis not present

## 2021-01-21 DIAGNOSIS — R6520 Severe sepsis without septic shock: Secondary | ICD-10-CM | POA: Diagnosis present

## 2021-01-21 DIAGNOSIS — J9811 Atelectasis: Secondary | ICD-10-CM | POA: Diagnosis not present

## 2021-01-21 DIAGNOSIS — J189 Pneumonia, unspecified organism: Secondary | ICD-10-CM | POA: Diagnosis not present

## 2021-01-21 DIAGNOSIS — Z96653 Presence of artificial knee joint, bilateral: Secondary | ICD-10-CM | POA: Diagnosis present

## 2021-01-21 DIAGNOSIS — E039 Hypothyroidism, unspecified: Secondary | ICD-10-CM | POA: Diagnosis present

## 2021-01-21 DIAGNOSIS — N390 Urinary tract infection, site not specified: Secondary | ICD-10-CM | POA: Diagnosis present

## 2021-01-21 DIAGNOSIS — D329 Benign neoplasm of meninges, unspecified: Secondary | ICD-10-CM | POA: Diagnosis not present

## 2021-01-21 DIAGNOSIS — I10 Essential (primary) hypertension: Secondary | ICD-10-CM | POA: Diagnosis present

## 2021-01-21 DIAGNOSIS — K589 Irritable bowel syndrome without diarrhea: Secondary | ICD-10-CM | POA: Diagnosis present

## 2021-01-21 DIAGNOSIS — E876 Hypokalemia: Secondary | ICD-10-CM | POA: Diagnosis present

## 2021-01-21 DIAGNOSIS — J90 Pleural effusion, not elsewhere classified: Secondary | ICD-10-CM | POA: Diagnosis not present

## 2021-01-21 DIAGNOSIS — E782 Mixed hyperlipidemia: Secondary | ICD-10-CM | POA: Diagnosis present

## 2021-01-21 DIAGNOSIS — R296 Repeated falls: Secondary | ICD-10-CM | POA: Diagnosis present

## 2021-01-21 DIAGNOSIS — J9601 Acute respiratory failure with hypoxia: Secondary | ICD-10-CM | POA: Diagnosis present

## 2021-01-21 DIAGNOSIS — Z85828 Personal history of other malignant neoplasm of skin: Secondary | ICD-10-CM | POA: Diagnosis not present

## 2021-01-21 LAB — RESPIRATORY PANEL BY PCR

## 2021-01-21 LAB — GLUCOSE, CAPILLARY: Glucose-Capillary: 130 mg/dL — ABNORMAL HIGH (ref 70–99)

## 2021-01-21 LAB — CBC WITH DIFFERENTIAL/PLATELET
Abs Immature Granulocytes: 0 10*3/uL (ref 0.00–0.07)
Basophils Absolute: 0 10*3/uL (ref 0.0–0.1)
Basophils Relative: 0 %
Eosinophils Absolute: 0 10*3/uL (ref 0.0–0.5)
Eosinophils Relative: 0 %
HCT: 39.4 % (ref 36.0–46.0)
Hemoglobin: 13.3 g/dL (ref 12.0–15.0)
Lymphocytes Relative: 0 %
Lymphs Abs: 0 10*3/uL — ABNORMAL LOW (ref 0.7–4.0)
MCH: 29.8 pg (ref 26.0–34.0)
MCHC: 33.8 g/dL (ref 30.0–36.0)
MCV: 88.1 fL (ref 80.0–100.0)
Monocytes Absolute: 0.6 10*3/uL (ref 0.1–1.0)
Monocytes Relative: 5 %
Neutro Abs: 10.6 10*3/uL — ABNORMAL HIGH (ref 1.7–7.7)
Neutrophils Relative %: 95 %
Platelets: 213 10*3/uL (ref 150–400)
RBC: 4.47 MIL/uL (ref 3.87–5.11)
RDW: 13.3 % (ref 11.5–15.5)
WBC: 11.2 10*3/uL — ABNORMAL HIGH (ref 4.0–10.5)
nRBC: 0 % (ref 0.0–0.2)
nRBC: 0 /100 WBC

## 2021-01-21 LAB — COMPREHENSIVE METABOLIC PANEL
ALT: 56 U/L — ABNORMAL HIGH (ref 0–44)
AST: 82 U/L — ABNORMAL HIGH (ref 15–41)
Albumin: 2.3 g/dL — ABNORMAL LOW (ref 3.5–5.0)
Alkaline Phosphatase: 88 U/L (ref 38–126)
Anion gap: 10 (ref 5–15)
BUN: 23 mg/dL (ref 8–23)
CO2: 24 mmol/L (ref 22–32)
Calcium: 9.1 mg/dL (ref 8.9–10.3)
Chloride: 98 mmol/L (ref 98–111)
Creatinine, Ser: 0.73 mg/dL (ref 0.44–1.00)
GFR, Estimated: >60 mL/min (ref >60)
Glucose, Bld: 113 mg/dL — ABNORMAL HIGH (ref 70–99)
Potassium: 3.1 mmol/L — ABNORMAL LOW (ref 3.5–5.1)
Sodium: 132 mmol/L — ABNORMAL LOW (ref 135–145)
Total Bilirubin: 1.4 mg/dL — ABNORMAL HIGH (ref 0.3–1.2)
Total Protein: 5 g/dL — ABNORMAL LOW (ref 6.5–8.1)

## 2021-01-21 LAB — ECHOCARDIOGRAM COMPLETE
AR max vel: 2.24 cm2
AV Peak grad: 9.3 mmHg
Ao pk vel: 1.53 m/s
Area-P 1/2: 5.09 cm2
Calc EF: 57 %
Height: 64 in
S' Lateral: 2.8 cm
Single Plane A2C EF: 55.7 %
Single Plane A4C EF: 57.5 %
Weight: 2232.82 oz

## 2021-01-21 LAB — STREP PNEUMONIAE URINARY ANTIGEN: Strep Pneumo Urinary Antigen: POSITIVE — AB

## 2021-01-21 MED ORDER — POTASSIUM CHLORIDE CRYS ER 20 MEQ PO TBCR
40.0000 meq | EXTENDED_RELEASE_TABLET | Freq: Every day | ORAL | Status: AC
  Administered 2021-01-21 – 2021-01-22 (×2): 40 meq via ORAL
  Filled 2021-01-21 (×2): qty 2

## 2021-01-21 MED ORDER — ATENOLOL 25 MG PO TABS
25.0000 mg | ORAL_TABLET | Freq: Every day | ORAL | Status: DC
  Administered 2021-01-21 – 2021-01-29 (×9): 25 mg via ORAL
  Filled 2021-01-21 (×9): qty 1

## 2021-01-21 MED ORDER — LACTATED RINGERS IV BOLUS
1000.0000 mL | Freq: Once | INTRAVENOUS | Status: AC
  Administered 2021-01-21: 1000 mL via INTRAVENOUS

## 2021-01-21 MED ORDER — POTASSIUM CHLORIDE CRYS ER 20 MEQ PO TBCR
40.0000 meq | EXTENDED_RELEASE_TABLET | Freq: Once | ORAL | Status: AC
  Administered 2021-01-21: 40 meq via ORAL
  Filled 2021-01-21: qty 2

## 2021-01-21 MED ORDER — LACTATED RINGERS IV SOLN: INTRAVENOUS | Status: DC

## 2021-01-21 MED ORDER — LACTATED RINGERS IV SOLN: INTRAVENOUS | Status: AC

## 2021-01-21 NOTE — Evaluation (Signed)
Occupational Therapy Evaluation Patient Details Name: Courtney Castro MRN: [id number] DOB: 1933/04/12 Today's Date: 01/21/2021   History of Present Illness Pt is an 87 y/o female admitted 10/28 with malaise, SOB and generally not feeling well since return from trip to [place].  Work up showing severe sepsis due to multifocal PNA seen on imaging with respiratory failure needing 5L HFNC.  Noted demand ischemia with elevated troponin  168 now trending down at 137.  PMHx:  HTN, [place] CA in remission, SDH/SAH, IBS, macular degeneration.   Clinical Impression   Pt admitted for concerns listed above. PTA pt reported that she was independent with all ADL's and IADL's, including driving, cooking, and cleaning. At this time, pt presents with decreased activity tolerance, weakness, and balance deficits. Pt requiring supervision to min A level of assist for safety with all ADL's and transfers. Recommend HHOT to maximize pt's return to independence. OT will follow acutely to address problems listed below.       Recommendations for follow up therapy are one component of a multi-disciplinary discharge planning process, led by the attending physician.  Recommendations may be updated based on patient status, additional functional criteria and insurance authorization.   Follow Up Recommendations  Home health OT    Assistance Recommended at Discharge Intermittent Supervision/Assistance  Functional Status Assessment  Patient has had a recent decline in their functional status and demonstrates the ability to make significant improvements in function in a reasonable and predictable amount of time.  Equipment Recommendations  Other (comment) (RW)    Recommendations for Other Services       Precautions / Restrictions Precautions Precautions: Fall Restrictions Weight Bearing Restrictions: No      Mobility Bed Mobility Overal bed mobility: Modified Independent             General bed mobility  comments: slow, but without effort    Transfers Overall transfer level: Needs assistance   Transfers: Sit to/from Stand Sit to Stand: Min guard;From elevated surface           General transfer comment: cues for hand placement      Balance Overall balance assessment: Needs assistance Sitting-balance support: No upper extremity supported;Feet supported Sitting balance-Leahy Scale: Fair       Standing balance-Leahy Scale: Fair Standing balance comment: statically no assist needed.                           ADL either performed or assessed with clinical judgement   ADL Overall ADL's : Needs assistance/impaired Eating/Feeding: Independent   Grooming: Min guard;Standing   Upper Body Bathing: Supervision/ safety;Sitting   Lower Body Bathing: Minimal assistance;Sitting/lateral leans;Sit to/from stand   Upper Body Dressing : Supervision/safety;Sitting   Lower Body Dressing: Minimal assistance;Sitting/lateral leans;Sit to/from stand   Toilet Transfer: Minimal assistance;Stand-pivot   Toileting- Clothing Manipulation and Hygiene: Minimal assistance;Sitting/lateral lean;Sit to/from stand       Functional mobility during ADLs: Minimal assistance;Rolling walker (2 wheels) General ADL Comments: Pt limited by decreased activity tolerance, requiring min A for LB ADL's and transfers     Vision Baseline Vision/History: 1 Wears glasses Ability to See in Adequate Light: 0 Adequate Patient Visual Report: No change from baseline Vision Assessment?: No apparent visual deficits     Perception     Praxis      Pertinent Vitals/Pain Pain Assessment: No/denies pain     Hand Dominance     Extremity/Trunk Assessment Upper Extremity Assessment Upper Extremity  Assessment: Overall WFL for tasks assessed   Lower Extremity Assessment Lower Extremity Assessment: Defer to PT evaluation   Cervical / Trunk Assessment Cervical / Trunk Assessment: Normal   Communication  Communication Communication: No difficulties   Cognition Arousal/Alertness: Awake/alert Behavior During Therapy: WFL for tasks assessed/performed Overall Cognitive Status: Within Functional Limits for tasks assessed                                       General Comments  HR at rest 122, BP 167/84, With activity HR 151, BP 199/107, On 3L O2 pt ~94%    Exercises     Shoulder Instructions      Home Living Family/patient expects to be discharged to:: Private residence Living Arrangements: Alone Available Help at Discharge: Family;Friend(s);Available PRN/intermittently Type of Home: House Home Access: Stairs to enter Entrance Stairs-Number of Steps: 1   Home Layout: Two level;Able to live on main level with bedroom/bathroom     Bathroom Shower/Tub: Occupational psychologist: Standard     Home Equipment: Shower seat;Grab bars - tub/shower;Cane - single point          Prior Functioning/Environment Prior Level of Function : Independent/Modified Independent;Driving                        OT Problem List: Decreased strength;Decreased activity tolerance;Impaired balance (sitting and/or standing);Cardiopulmonary status limiting activity      OT Treatment/Interventions: Self-care/ADL training;Therapeutic exercise;Energy conservation;DME and/or AE instruction;Therapeutic activities;Patient/family education;Balance training    OT Goals(Current goals can be found in the care plan section) Acute Rehab OT Goals Patient Stated Goal: To feel better OT Goal Formulation: With patient Time For Goal Achievement: 02/04/21 Potential to Achieve Goals: Good ADL Goals Pt Will Perform Lower Body Bathing: with modified independence;sitting/lateral leans;sit to/from stand Pt Will Perform Lower Body Dressing: with modified independence;sitting/lateral leans;sit to/from stand Pt Will Transfer to Toilet: with modified independence;ambulating Pt Will Perform  Toileting - Clothing Manipulation and hygiene: with modified independence;sitting/lateral leans;sit to/from stand Additional ADL Goal #1: Pt will recall 3 energy conservation techniques that she can use at home.  OT Frequency: Min 2X/week   Barriers to D/C:            Co-evaluation              AM-PAC OT "6 Clicks" Daily Activity     Outcome Measure Help from another person eating meals?: None Help from another person taking care of personal grooming?: A Little Help from another person toileting, which includes using toliet, bedpan, or urinal?: A Little Help from another person bathing (including washing, rinsing, drying)?: A Little Help from another person to put on and taking off regular upper body clothing?: A Little Help from another person to put on and taking off regular lower body clothing?: A Little 6 Click Score: 19   End of Session Equipment Utilized During Treatment: Gait belt;Rolling walker (2 wheels);Oxygen Nurse Communication: Mobility status  Activity Tolerance: Patient tolerated treatment well Patient left: in bed;with call bell/phone within reach;with family/visitor present  OT Visit Diagnosis: Unsteadiness on feet (R26.81);Other abnormalities of gait and mobility (R26.89);Muscle weakness (generalized) (M62.81)                Time: 9371-6967 OT Time Calculation (min): 54 min Charges:  OT General Charges $OT Visit: 1 Visit OT Evaluation $OT Eval Moderate Complexity: 1 Mod  OT Treatments $Self Care/Home Management : 23-37 mins $Therapeutic Activity: 8-22 mins  Hiawatha Dressel H., OTR/L Acute Rehabilitation  Porfirio Bollier Elane Yolanda Bonine 01/21/2021, 4:54 PM

## 2021-01-21 NOTE — Significant Event (Addendum)
Pt with crackles, back to needing 5L HFNC (had been down to 2L), BP 419 systolic.  Will stop IVF for the moment.  Get repeat CXR to exclude pulmonary edema.  Addendum:  Pt seen and evaluated at bedside.  Only trace peripheral edema.  Pt with mild-mod increased WOB, satting well but on 6L via HFNC.  BP running 914C systolic currently.  IVF stopped.  CXR showing worsening perihilar PNA findings.  1) s.pneumo urinary antigen 2) MRSA PCR nares 3) check lactate 4) check BNP 5) IVF stopped for the moment

## 2021-01-21 NOTE — Progress Notes (Signed)
Dark PROGRESS NOTE    Courtney Castro  TIW:[id number] DOB: 1934-02-21 DOA: 01/20/2021 PCP: Courtney Ohara, MD   Brief Narrative/Hospital Course: Courtney Castro, 87 y.o. female with PMH of  Hypertension HLD, subdural and subarachnoid bleeding, history of breast cancer in remission presents to the ED with malaise, shortness of breath not feeling well ever since her trip from [place] past week, return on Thursday, October 20.  She complains of having a fall while in [place]'s that was minimal with scratches.  She has been complaining of malaise generalized weakness in addition to dyspnea at rest and with exertion feeling warm but no known fever no chest pain.  She was seen in the ED febrile 103 blood pressure 139/84 heart rate 112 saturating 91% room air lab with leukocytosis 11.1, hyponatremia hypokalemia AST 65 ALT 49 total bili 1.9 COVID-negative lactic acid 1.7 chest x-ray consolidation of the left lower lobe and bilateral upper lobes multifocal infection, CT head was negative patient was placed on antibiotics and admitted under teaching service Since admissions he has been running high blood pressure in 200s needing hydralazine doses 10 mg x 2 last was at 8 PM and around 9 PM blood pressure still high at 197 then at 10 PM systolic blood pressure was low at 104 and she became transiently unresponsive code stroke was activated and seen by neurology-CT head was done no acute finding, advised IV fluid hydration hypotension work-up and neurology available as needed  Subjective: This am on 5l HFNC, is alert awake and at baseline. But feels weak generalized. Overnight code stroke was activated due to transient unresponsiveness in the setting of hypotension.  Assessment & Plan:  Severe Sepsis POA due to multifocal pneumonia Multifocal pneumonia: Met severe sepsis Criteria with fever tachycardia tachypnea with pneumonia and hypoxia. T-max 103.2 last night at 8, has been on HFNC at 5 L, blood pressure  otherwise is stable lab work with WBC 1.2, stable renal function.  Significantly elevated procalcitonin 18.7.  Continue empiric ceftriaxone azithro.  UA abnormal with WBC 20-50 - f/u urine culture blood culture pending.  Check urinary antigens and sputum culture. Recent Labs  Lab 01/20/21 0939 01/20/21 0950 01/20/21 1139 01/20/21 1441 01/21/21 0451  WBC  --  11.8*  --   --  11.2*  LATICACIDVEN 1.7  --  1.6  --   --   PROCALCITON  --   --   --  18.74  --    Acute hypoxic respiratory failure needing 5 L HFNC.  Pulmonary toileting treatment of pneumonia as above  Demand ischemia /elevated troponin 168>137: Elevated troponin likely due to demand ischemia in the setting of sepsis/hypoxia and accelerated hypertension.  Troponin downtrending.  Not complain of chest pain will obtain echocardiogram to look for WMA. EKG- ST non sp st changes. She denies any chest pain.  Elevated D-dimer 4.4 with negative CT angio chest for PE.  Check duplex of the leg  Uncontrolled/accelerated hypertension: On admission BP very high in 190s to 200s needing multiple doses of IV hydralazine.  Normally on atenolol, losartan and HCTZ at home.  Minimize hypotension resume home meds as blood pressure allows I would avoid hydralazine to minimize acute drop.  Hypokalemia replete Hypovolemic hyponatremia continue with IV fluids Mild transaminitis AST 82 ALT 56 TB 1.4.  Monitor check acute viral hepatitis panel. Hold statin Dehydration with ketones in urine: Continue IV fluid hydration  HLD on statin at home.  Code stroke in the setting of hypotension likely from hydralazine:  resolve. CT brain no acute finding.  Seen by neurology.  If recurrent episodes or somnolence can obtain MRI.   Hypothyroidism continue Synthroid  General weakness fall at home deconditioning debility: In the setting of infection.  PT OT evaluation as tolerated  Goals of care DNR on admission  DVT prophylaxis: enoxaparin (LOVENOX) injection 40 mg  Start: 01/20/21 1600 Code Status:   Code Status: DNR Family Communication: plan of care discussed with patient at bedside. Status is: admitted as Observation Remains hospitalized for ongoing management of sepsis ,pneumonia, hypoxia and will need at least more than 2 midnights, change to inpatient Medically not stable  Anticipated dispo- 2-3 days  Objective: Vitals last 24 hrs: Vitals:   01/21/21 0000 01/21/21 0339 01/21/21 0756 01/21/21 0800  BP:  (!) 146/71    Pulse:  79    Resp:      Temp: 98.6 F (37 C) 98.7 F (37.1 C)  98.5 F (36.9 C)  TempSrc:  Axillary  Oral  SpO2:   97%   Weight:      Height:       Weight change:   Intake/Output Summary (Last 24 hours) at 01/21/2021 1020 Last data filed at 01/21/2021 0438 Gross per 24 hour  Intake 0 ml  Output 3 ml  Net -3 ml   Net IO Since Admission: -3 mL [01/21/21 1020]   Physical Examination: General exam: Aa0x3, weak,older than stated age. HEENT:Oral mucosa moist, Ear/Nose WNL grossly,dentition normal. Respiratory system: B/l coarse BS, no use of accessory muscle, non tender. Cardiovascular system: S1 & S2 +,No JVD. Gastrointestinal system: Abdomen soft, NT,ND, BS+. Nervous System:Alert, awake, moving extremities. Extremities: edema none, distal peripheral pulses palpable.  Skin: No rashes, no icterus. MSK: Normal muscle bulk, tone, power.  Medications reviewed:  Scheduled Meds:  enoxaparin (LOVENOX) injection  40 mg Subcutaneous Q24H   ipratropium-albuterol  3 mL Nebulization Q4H while awake   levothyroxine  25 mcg Oral Q0600   lidocaine  1 patch Transdermal Q24H   sodium chloride flush  3 mL Intravenous Q12H   Continuous Infusions:  azithromycin     cefTRIAXone (ROCEPHIN)  IV     lactated ringers     lactated ringers 100 mL/hr at 01/21/21 0438   Diet Order             Diet regular Room service appropriate? Yes; Fluid consistency: Thin  Diet effective now                          Weight change:    Wt Readings from Last 3 Encounters:  01/20/21 63.3 kg  01/19/21 63.1 kg  01/16/21 63 kg     Consultants:see note  Procedures:see note Antimicrobials: Anti-infectives (From admission, onward)    Start     Dose/Rate Route Frequency Ordered Stop   01/21/21 1100  azithromycin (ZITHROMAX) 500 mg in sodium chloride 0.9 % 250 mL IVPB        500 mg 250 mL/hr over 60 Minutes Intravenous Every 24 hours 01/20/21 1334 01/23/21 1059   01/21/21 1100  cefTRIAXone (ROCEPHIN) 1 g in sodium chloride 0.9 % 100 mL IVPB        1 g 200 mL/hr over 30 Minutes Intravenous Every 24 hours 01/20/21 1334 01/25/21 1059   01/20/21 1130  cefTRIAXone (ROCEPHIN) 1 g in sodium chloride 0.9 % 100 mL IVPB        1 g 200 mL/hr over 30 Minutes Intravenous  Once 01/20/21  1120 01/20/21 1247   01/20/21 1130  azithromycin (ZITHROMAX) 500 mg in sodium chloride 0.9 % 250 mL IVPB        500 mg 250 mL/hr over 60 Minutes Intravenous  Once 01/20/21 1120 01/20/21 1458      Culture/Microbiology    Component Value Date/Time   SDES BLOOD LEFT ANTECUBITAL 01/20/2021 1147   SPECREQUEST  01/20/2021 1147    BOTTLES DRAWN AEROBIC ONLY Blood Culture adequate volume   CULT  01/20/2021 1147    NO GROWTH < 24 HOURS Performed at [facility] [address] [place], [place] [address]    REPTSTATUS PENDING 01/20/2021 1147    Other culture-see note  Unresulted Labs (From admission, onward)     Start     Ordered   01/22/21 3818  Basic metabolic panel  Daily,   R     Question:  Specimen collection method  Answer:  Lab=Lab collect   01/21/21 0904   01/22/21 0500  CBC  Daily,   R     Question:  Specimen collection method  Answer:  Lab=Lab collect   01/21/21 0904   01/21/21 0906  Hepatitis panel, acute  Add-on,   AD        01/21/21 0906   01/21/21 0905  Expectorated Sputum Assessment w Gram Stain, Rflx to Resp Cult  Once,   R        01/21/21 0904   01/20/21 1545  Strep pneumoniae urinary antigen  Once,   R         01/20/21 1544   01/20/21 1545  Legionella Pneumophila Serogp 1 Ur Ag  Once,   R        01/20/21 1544   01/20/21 1335  Urine Culture  ONCE - STAT,   STAT       Question:  Indication  Answer:  Sepsis   01/20/21 1335           Data Reviewed: I have personally reviewed following labs and imaging studies CBC: Recent Labs  Lab 01/20/21 0950 01/21/21 0451  WBC 11.8* 11.2*  NEUTROABS 11.1* 10.6*  HGB 14.6 13.3  HCT 40.8 39.4  MCV 85.9 88.1  PLT 212 299   Basic Metabolic Panel: Recent Labs  Lab 01/20/21 0950 01/21/21 0451  NA 127* 132*  K 3.2* 3.1*  CL 93* 98  CO2 21* 24  GLUCOSE 136* 113*  BUN 26* 23  CREATININE 0.90 0.73  CALCIUM 9.4 9.1   GFR: Estimated Creatinine Clearance: 44.4 mL/min (by C-G formula based on SCr of 0.73 mg/dL). Liver Function Tests: Recent Labs  Lab 01/20/21 0950 01/21/21 0451  AST 65* 82*  ALT 49* 56*  ALKPHOS 108 88  BILITOT 1.9* 1.4*  PROT 5.7* 5.0*  ALBUMIN 2.9* 2.3*   No results for input(s): LIPASE, AMYLASE in the last 168 hours. No results for input(s): AMMONIA in the last 168 hours. Coagulation Profile: Recent Labs  Lab 01/20/21 0950  INR 1.1   Cardiac Enzymes: No results for input(s): CKTOTAL, CKMB, CKMBINDEX, TROPONINI in the last 168 hours. BNP (last 3 results) No results for input(s): PROBNP in the last 8760 hours. HbA1C: Recent Labs    01/20/21 1441  HGBA1C 5.7*   CBG: Recent Labs  Lab 01/20/21 2358  GLUCAP 130*   Lipid Profile: No results for input(s): CHOL, HDL, LDLCALC, TRIG, CHOLHDL, LDLDIRECT in the last 72 hours. Thyroid Function Tests: No results for input(s): TSH, T4TOTAL, FREET4, T3FREE, THYROIDAB in the last 72 hours.  Anemia Panel: No results for input(s): VITAMINB12, FOLATE, FERRITIN, TIBC, IRON, RETICCTPCT in the last 72 hours. Sepsis Labs: Recent Labs  Lab 01/20/21 1610 01/20/21 1139 01/20/21 1441  PROCALCITON  --   --  18.74  LATICACIDVEN 1.7 1.6  --     Recent Results (from the past  240 hour(s))  Resp Panel by RT-PCR (Flu A&B, Covid) Nasopharyngeal Swab     Status: None   Collection Time: 01/20/21  9:49 AM   Specimen: Nasopharyngeal Swab; Nasopharyngeal(NP) swabs in vial transport medium  Result Value Ref Range Status   SARS Coronavirus 2 by RT PCR NEGATIVE NEGATIVE Final    Comment: (NOTE) SARS-CoV-2 target nucleic acids are NOT DETECTED.  The SARS-CoV-2 RNA is generally detectable in upper respiratory specimens during the acute phase of infection. The lowest concentration of SARS-CoV-2 viral copies this assay can detect is 138 copies/mL. A negative result does not preclude SARS-Cov-2 infection and should not be used as the sole basis for treatment or other patient management decisions. A negative result may occur with  improper specimen collection/handling, submission of specimen other than nasopharyngeal swab, presence of viral mutation(s) within the areas targeted by this assay, and inadequate number of viral copies(<138 copies/mL). A negative result must be combined with clinical observations, patient history, and epidemiological information. The expected result is Negative.  Fact Sheet for Patients:  EntrepreneurPulse.com.au  Fact Sheet for Healthcare Providers:  IncredibleEmployment.be  This test is no t yet approved or cleared by the [place] FDA and  has been authorized for detection and/or diagnosis of SARS-CoV-2 by FDA under an Emergency Use Authorization (EUA). This EUA will remain  in effect (meaning this test can be used) for the duration of the COVID-19 declaration under Section 564(b)(1) of the Act, 21 U.S.C.section 360bbb-3(b)(1), unless the authorization is terminated  or revoked sooner.       Influenza A by PCR NEGATIVE NEGATIVE Final   Influenza B by PCR NEGATIVE NEGATIVE Final    Comment: (NOTE) The Xpert Xpress SARS-CoV-2/FLU/RSV plus assay is intended as an aid in the diagnosis of influenza  from Nasopharyngeal swab specimens and should not be used as a sole basis for treatment. Nasal washings and aspirates are unacceptable for Xpert Xpress SARS-CoV-2/FLU/RSV testing.  Fact Sheet for Patients: EntrepreneurPulse.com.au  Fact Sheet for Healthcare Providers: IncredibleEmployment.be  This test is not yet approved or cleared by the [place] FDA and has been authorized for detection and/or diagnosis of SARS-CoV-2 by FDA under an Emergency Use Authorization (EUA). This EUA will remain in effect (meaning this test can be used) for the duration of the COVID-19 declaration under Section 564(b)(1) of the Act, 21 U.S.C. section 360bbb-3(b)(1), unless the authorization is terminated or revoked.  Performed at [facility], [place] [address], [place], [place] [address]   Culture, blood (Routine x 2)     Status: None (Preliminary result)   Collection Time: 01/20/21  9:50 AM   Specimen: BLOOD LEFT HAND  Result Value Ref Range Status   Specimen Description BLOOD LEFT HAND  Final   Special Requests   Final    BOTTLES DRAWN AEROBIC AND ANAEROBIC Blood Culture adequate volume   Culture   Final    NO GROWTH < 24 HOURS Performed at [facility], [place] [address], [place], [place] [address]    Report Status PENDING  Incomplete  Culture, blood (Routine x 2)     Status: None (Preliminary result)   Collection Time: 01/20/21 11:47 AM  Specimen: BLOOD  Result Value Ref Range Status   Specimen Description BLOOD LEFT ANTECUBITAL  Final   Special Requests   Final    BOTTLES DRAWN AEROBIC ONLY Blood Culture adequate volume   Culture   Final    NO GROWTH < 24 HOURS Performed at [facility], [address], [place], [place] [address]    Report Status PENDING  Incomplete  Respiratory (~20 pathogens) panel by PCR     Status: None   Collection Time: 01/20/21  3:45 PM   Specimen: Nasopharyngeal Swab; Respiratory  Result Value Ref Range  Status   Adenovirus NOT DETECTED NOT DETECTED Final   Coronavirus 229E NOT DETECTED NOT DETECTED Final    Comment: (NOTE) The Coronavirus on the Respiratory Panel, DOES NOT test for the novel  Coronavirus (2019 nCoV)    Coronavirus HKU1 NOT DETECTED NOT DETECTED Final   Coronavirus NL63 NOT DETECTED NOT DETECTED Final   Coronavirus OC43 NOT DETECTED NOT DETECTED Final   Metapneumovirus NOT DETECTED NOT DETECTED Final   Rhinovirus / Enterovirus NOT DETECTED NOT DETECTED Final   Influenza A NOT DETECTED NOT DETECTED Final   Influenza B NOT DETECTED NOT DETECTED Final   Parainfluenza Virus 1 NOT DETECTED NOT DETECTED Final   Parainfluenza Virus 2 NOT DETECTED NOT DETECTED Final   Parainfluenza Virus 3 NOT DETECTED NOT DETECTED Final   Parainfluenza Virus 4 NOT DETECTED NOT DETECTED Final   Respiratory Syncytial Virus NOT DETECTED NOT DETECTED Final   Bordetella pertussis NOT DETECTED NOT DETECTED Final   Bordetella Parapertussis NOT DETECTED NOT DETECTED Final   Chlamydophila pneumoniae NOT DETECTED NOT DETECTED Final   Mycoplasma pneumoniae NOT DETECTED NOT DETECTED Final    Comment: Performed at [facility], [place] [address] [place], [place] [address]     Radiology Studies: DG Chest 2 View  Result Date: 01/20/2021 CLINICAL DATA:  Sepsis EXAM: CHEST - 2 VIEW COMPARISON:  Chest x-ray dated April 5th 2010 FINDINGS: Cardiac and mediastinal contours within normal limits. Consolidations of the left lower lobe and bilateral upper lobes. Possible small left pleural effusion. No evidence of pneumothorax. IMPRESSION: Consolidations of the left lower lobe and bilateral upper lobes, concerning for multifocal infection. Recommend follow-up in 4-6 weeks to ensure resolution. Electronically Signed   By: Yetta Glassman M.D.   On: 01/20/2021 10:17   CT HEAD WO CONTRAST (5MM)  Result Date: 01/21/2021 CLINICAL DATA:  Initial evaluation for lethargy, cerebral hemorrhage suspected. EXAM: CT HEAD  WITHOUT CONTRAST TECHNIQUE: Contiguous axial images were obtained from the base of the skull through the vertex without intravenous contrast. COMPARISON:  Prior CT from 01/20/2021. FINDINGS: Brain: Cerebral volume within normal limits for age. Probable minor chronic microvascular ischemic disease, also felt to be within normal limits for age. Small focus of chronic encephalomalacia noted at the anterior parasagittal left frontal lobe, which could be related to prior trauma and/or ischemia (series 3, image 19). No acute intracranial hemorrhage. No acute large vessel territory infarct. 1 cm right parafalcine meningioma along the anterior falx without associated mass effect (series 3, image 14). No other mass lesion, mass effect or midline shift. No hydrocephalus or extra-axial fluid collection. Vascular: No height asymmetric hyperdense vessel. Calcified atherosclerosis present at skull base. Skull: Scalp soft tissues within normal limits.  Calvarium intact. Sinuses/Orbits: Prior bilateral ocular lens replacement. Globes and orbital soft tissues demonstrate no acute finding. Paranasal sinuses are largely clear. No mastoid effusion. Other: Osteoarthritic changes noted about the TMJs bilaterally.  IMPRESSION: 1. No acute intracranial abnormality. 2. Small focus of chronic encephalomalacia at the anterior parasagittal left frontal lobe, which could be related to prior trauma and/or ischemia. 3. 1 cm right parafalcine meningioma without mass effect. Electronically Signed   By: Jeannine Boga M.D.   On: 01/21/2021 02:40   CT HEAD WO CONTRAST (5MM)  Result Date: 01/20/2021 CLINICAL DATA:  Multiple recent falls, altered mental status EXAM: CT HEAD WITHOUT CONTRAST TECHNIQUE: Contiguous axial images were obtained from the base of the skull through the vertex without intravenous contrast. COMPARISON:  07/17/2019 FINDINGS: Brain: No evidence of acute infarction, hemorrhage, hydrocephalus, extra-axial collection or mass  lesion/mass effect. Periventricular and deep white matter hypodensity. Vascular: No hyperdense vessel or unexpected calcification. Skull: Normal. Negative for fracture or focal lesion. Sinuses/Orbits: No acute finding. Other: None. IMPRESSION: No acute intracranial pathology. Small-vessel white matter disease. Electronically Signed   By: Delanna Ahmadi M.D.   On: 01/20/2021 11:00   CT Angio Chest Pulmonary Embolism (PE) W or WO Contrast  Result Date: 01/20/2021 CLINICAL DATA:  Sepsis, positive D-dimer EXAM: CT ANGIOGRAPHY CHEST WITH CONTRAST TECHNIQUE: Multidetector CT imaging of the chest was performed using the standard protocol during bolus administration of intravenous contrast. Multiplanar CT image reconstructions and MIPs were obtained to evaluate the vascular anatomy. CONTRAST:  39m OMNIPAQUE IOHEXOL 350 MG/ML SOLN COMPARISON:  Chest radiographs dated 01/12/2021 FINDINGS: Cardiovascular: Satisfactory opacification the bilateral pulmonary arteries to the segmental level. No evidence of pulmonary embolism. Although not tailored for evaluation of the thoracic aorta, there is no evidence of thoracic aortic aneurysm or dissection. Heart is normal in size.  No pericardial effusion. Three vessel coronary atherosclerosis. Mediastinum/Nodes: No suspicious mediastinal lymphadenopathy. Visualized thyroid is unremarkable. Lungs/Pleura: Multifocal patchy opacities in the left upper lobe/lingula and bilateral lower lobes, left lower lobe predominant, suspicious for multifocal pneumonia. Small bilateral pleural effusions. Evaluation of the lung parenchyma is constrained by respiratory motion. Within that constraint, there are no suspicious pulmonary nodules. No pneumothorax. Upper Abdomen: Visualized upper abdomen is notable for prior cholecystectomy and vascular calcifications. Musculoskeletal: Mild degenerative changes of the lower thoracic/upper lumbar spine. Review of the MIP images confirms the above findings.  IMPRESSION: No evidence of pulmonary embolism. Multifocal pneumonia, left lower lobe predominant. Small bilateral pleural effusions. Aortic Atherosclerosis (ICD10-I70.0). Electronically Signed   By: SJulian HyM.D.   On: 01/20/2021 22:15     LOS: 0 days   RAntonieta Pert MD [facility]  01/21/2021, 10:20 AM

## 2021-01-21 NOTE — Progress Notes (Signed)
VASCULAR LAB    Bilateral lower extremity venous duplex has been performed.  See CV proc for preliminary results.   Brayant Dorr, RVT 01/21/2021, 5:57 PM

## 2021-01-21 NOTE — Consult Note (Addendum)
Neurology Consultation Reason for Consult: code stroke Referring Physician: Daryll Drown, E  CC: decreased responsiveness.   History is obtained from:chart review, referring providers  HPI: Courtney Castro is a 87 y.o. female with a history of hypertension, hyperlipidemia, subdural and subarachnoid who presents with increasing lethargy over the past couple of hours.  Her blood pressure was in the 200s earlier today, and she was given hydralazine 10 mg at 1851 and then again at 8 PM. At 9pm, she still had BP of 197, but then at 10 pm, she had a systolic of 366. In that setting, she became transiently unresponsive.  Code stroke was activated.    ROS:  Unable to obtain due to altered mental status.   Past Medical History:  Diagnosis Date   Allergy    seasonal allergies   Basal cell carcinoma 2007, 2015, 2017   face and shoulder (Dr. Derrel Nip)   2017-chin   [place] (basal cell carcinoma), leg, right 01/28/2018   right thigh-Dr Stinehelfer   Breast cancer [facility]) 1989   R breast-microinvasive papillary(lumpectomy and XRT)   Colon polyp 09/2000   Diverticulosis    seen on colonoscopy   Elevated liver function tests 06/2009   galllstones and dilated CBD--s/p ERCP and chole   Family history of breast cancer    Genetic testing 12/20/2016   Multi-Cancer panel (83 genes) @ [facility] - CHEK2 mutation   Gilbert syndrome    Hyperlipidemia    Hypertension    Hyperthyroidism    IBS (irritable bowel syndrome) 1984   Macular degeneration 2008   mild   Radial head fracture 02/2014   left, nondisplaced   Subarachnoid hemorrhage ([place]) 02/2014   after fall   Subdural hematoma 06/2008   Dr.Cram   Subdural hematoma 02/2014   after fall (Dr. Vertell Limber)     Family History  Problem Relation Age of Onset   Leukemia Mother        deceased 46; TAH/BSO in 96s   Coronary artery disease Father    Hypertension Father    Heart disease Father        onset late 5's   Coronary artery disease Brother 69        coronary artery bypass grafting, pacemaker   Heart disease Brother        CABG in 60's, pacemaker   Diabetes Paternal Uncle    Diabetes Paternal Grandmother    Breast cancer Daughter 23   Fibroids Daughter    Breast cancer Maternal Aunt 40       deceased 28s   Breast cancer Cousin 4       daughter of mat aunt with BC at 71; currently 60   Cancer Maternal Uncle        dx 48s; unk. type   Breast cancer Maternal Aunt        dx 7s; deceased 32s     Social History:  reports that she quit smoking about 57 years ago. Her smoking use included cigarettes. She has a 0.30 pack-year smoking history. She has never used smokeless tobacco. She reports current alcohol use. She reports that she does not use drugs.   Exam: Current vital signs: BP (!) 104/48   Pulse (!) 107   Temp 98.6 F (37 C)   Resp 19   Ht _0  (1.626 m)   Wt 63.3 kg   SpO2 98%   BMI 23.95 kg/m  Vital signs in last 24 hours: Temp:  [97.3 F (36.3 C)-103.2 F (39.6  C)] 98.6 F (37 C) (10/29 0000) Pulse Rate:  [79-112] 107 (10/28 2225) Resp:  [14-33] 19 (10/28 2225) BP: (93-208)/(48-107) 104/48 (10/28 2225) SpO2:  [87 %-98 %] 98 % (10/28 2225) Weight:  [63.3 kg] 63.3 kg (10/28 1735)   Physical Exam  Constitutional: Appears well-developed and well-nourished.  Psych: Affect appropriate to situation Eyes: No scleral injection HENT: No OP obstruction MSK: no joint deformities.  Cardiovascular: Normal rate and regular rhythm.  Respiratory: Effort normal, non-labored breathing GI: Soft.  No distension. There is no tenderness.  Skin: WDI  Neuro: Mental Status: Patient is lethargic but arousable, oriented to person, place, month, year, and situation. Patient is able to give a clear and coherent history. No signs of aphasia or neglect Cranial Nerves: II: Visual Fields are full. Pupils are equal, round, and reactive to light.   III,IV, VI: EOMI without ptosis or diploplia.  V: Facial sensation is symmetric to  temperature VII: Initially I was concerned for a right facial weakness, but then realized her lips were getting stuck on her teeth, once she cleared the teeth, her smile was symmetric.  VIII: hearing is intact to voice X: Uvula elevates symmetrically XI: Shoulder shrug is symmetric. XII: tongue is midline without atrophy or fasciculations.  Motor: Tone is normal. Bulk is normal. 5/5 strength was present in all four extremities.  Sensory: Sensation is symmetric to light touch and temperature in the arms and legs. Deep Tendon Reflexes: 2+ and symmetric in the biceps and patellae.  Cerebellar: FNF  intact bilaterally   I have reviewed labs in epic and the results pertinent to this consultation are: Cbg 130 Na 127 K 3.2   I have reviewed the images obtained: CT head-no clear acute findings by my read, radiology read pending.   Impression: 87 year old female admitted with pneumonia with acute encephalopathy in the setting of damatic blood pressure change.  I suspect that this is cerebral hypoperfusion, and on my evaluation she was in the 130s and perfusing well enough to have a nonfocal examination.  That being said, I did recommend some fluid resuscitation.  Though hydralazine is a likely culprit, given that she was 548 systolic an hour after hydralazine administration, and then bottomed out subsequently I would rule out other causes of hypotension as well.  Recommendations: 1) CT head (images reviewed at the time of dictation) 2) hypotension work-up per primary team 3) if she remains altered, consider MRI brain to assess for watershed infarcts 4) neurology will be available on an as-needed basis.   Roland Rack, MD [facility] [phone]  If 7pm- 7am, please page neurology on call as listed in [place].

## 2021-01-21 NOTE — Plan of Care (Signed)
  Problem: Education: Goal: Knowledge of General Education information will improve Description Including pain rating scale, medication(s)/side effects and non-pharmacologic comfort measures Outcome: Progressing   Problem: Health Behavior/Discharge Planning: Goal: Ability to manage health-related needs will improve Outcome: Progressing   

## 2021-01-21 NOTE — Code Documentation (Signed)
Stroke Response Nurse Documentation Code Documentation  Courtney Castro is a 87 y.o. female at [facility]  on 10/29 with past medical hx of SAH, HTN, HLD and admitted with pneumonia. On  Lovenox 40mg  . Code stroke was activated by IMTS.   Patient with  LKW at 43 and now complaining of lethargy and AMS.   Stroke team at the bedside.  NIHSS 0, see documentation for details and code stroke times. Patient with no fixed neurological deficit on exam. Code stroke was cancelled by Dr. Leonel Ramsay.   Bedside handoff with 2W RN.    Madelynn Done  Rapid Response RN

## 2021-01-21 NOTE — Evaluation (Signed)
Physical Therapy Evaluation Patient Details Name: Courtney Castro MRN: [id number] DOB: [birth year] Today's Date: 01/21/2021  History of Present Illness  Pt is an 87 y/o female admitted 10/28 with malaise, SOB and generally not feeling well since return from trip to [place].  Work up showing severe sepsis due to multifocal PNA seen on imaging with respiratory failure needing 5L HFNC.  Noted demand ischemia with elevated troponin  168 now trending down at 137.  PMHx:  HTN, [place] CA in remission, SDH/SAH, IBS, macular degeneration.  Clinical Impression  Pt admitted with/for PNA.  Pt not at baseline function needing min guard for basic mobility and gait.Marland Kitchen  Pt currently limited functionally due to the problems listed below.  (see problems list.)  Pt will benefit from PT to maximize function and safety to be able to get home safely with available assist.        Recommendations for follow up therapy are one component of a multi-disciplinary discharge planning process, led by the attending physician.  Recommendations may be updated based on patient status, additional functional criteria and insurance authorization.  Follow Up Recommendations Home health PT    Assistance Recommended at Discharge PRN  Functional Status Assessment Patient has had a recent decline in their functional status and demonstrates the ability to make significant improvements in function in a reasonable and predictable amount of time.  Equipment Recommendations  Other (comment) (3 in 1 or riser.  If toilet is unrepairable then new handicapped toilet.)    Recommendations for Other Services       Precautions / Restrictions Precautions Precautions: Fall      Mobility  Bed Mobility Overal bed mobility: Modified Independent             General bed mobility comments: slow, but without effort    Transfers Overall transfer level: Needs assistance   Transfers: Sit to/from Stand Sit to Stand: Min guard;From elevated  surface           General transfer comment: cues for hand placement    Ambulation/Gait Ambulation/Gait assistance: Min guard Gait Distance (Feet): 80 Feet Assistive device: Rolling walker (2 wheels) Gait Pattern/deviations: Step-through pattern   Gait velocity interpretation: <1.8 ft/sec, indicate of risk for recurrent falls General Gait Details: slow, mildly unsteady and guarded.  First use of the RW safe with cues.  Stairs            Wheelchair Mobility    Modified Rankin (Stroke Patients Only)       Balance Overall balance assessment: Needs assistance Sitting-balance support: No upper extremity supported;Feet supported Sitting balance-Leahy Scale: Fair       Standing balance-Leahy Scale: Fair Standing balance comment: statically no assist needed.                             Pertinent Vitals/Pain Pain Assessment: No/denies pain    Home Living Family/patient expects to be discharged to:: Private residence Living Arrangements: Alone Available Help at Discharge: Family;Friend(s);Available PRN/intermittently Type of Home: House Home Access: Stairs to enter   Entrance Stairs-Number of Steps: 1   Home Layout: Two level;Able to live on main level with bedroom/bathroom Home Equipment: Shower seat;Grab bars - tub/shower;Cane - single point      Prior Function Prior Level of Function : Independent/Modified Independent;Driving                     Hand Dominance  Extremity/Trunk Assessment   Upper Extremity Assessment Upper Extremity Assessment: Overall WFL for tasks assessed    Lower Extremity Assessment Lower Extremity Assessment: Overall WFL for tasks assessed (just weak since home from [place])    Cervical / Trunk Assessment Cervical / Trunk Assessment: Normal  Communication   Communication: No difficulties  Cognition Arousal/Alertness: Awake/alert Behavior During Therapy: WFL for tasks assessed/performed Overall  Cognitive Status: Within Functional Limits for tasks assessed                                          General Comments General comments (skin integrity, edema, etc.): Sats on RA dropped slowly to 91% from mid 90's.  On 3L [place], sats maintained mid 90's during gait with HR rising to 113 bpm.  Noted mild dyspnea during gait, pt reporting fatigue.    Exercises     Assessment/Plan    PT Assessment Patient needs continued PT services  PT Problem List Decreased strength;Decreased activity tolerance;Decreased mobility;Decreased balance;Cardiopulmonary status limiting activity;Decreased knowledge of use of DME       PT Treatment Interventions DME instruction;Gait training;Stair training;Functional mobility training;Therapeutic activities;Patient/family education;Balance training    PT Goals (Current goals can be found in the Care Plan section)  Acute Rehab PT Goals Patient Stated Goal: home, independent, continuing exercise program in the community PT Goal Formulation: With patient Time For Goal Achievement: 02/04/21 Potential to Achieve Goals: Good    Frequency Min 3X/week   Barriers to discharge        Co-evaluation               AM-PAC PT "6 Clicks" Mobility  Outcome Measure Help needed turning from your back to your side while in a flat bed without using bedrails?: A Little Help needed moving from lying on your back to sitting on the side of a flat bed without using bedrails?: A Little Help needed moving to and from a bed to a chair (including a wheelchair)?: A Little Help needed standing up from a chair using your arms (e.g., wheelchair or bedside chair)?: A Little Help needed to walk in hospital room?: A Little Help needed climbing 3-5 steps with a railing? : A Little 6 Click Score: 18    End of Session Equipment Utilized During Treatment: Oxygen Activity Tolerance: Patient tolerated treatment well;Patient limited by fatigue Patient left: in  chair;with call bell/phone within reach;with chair alarm set Nurse Communication: Mobility status PT Visit Diagnosis: Other abnormalities of gait and mobility (R26.89);Unsteadiness on feet (R26.81);Difficulty in walking, not elsewhere classified (R26.2)    Time: 8921-1941 PT Time Calculation (min) (ACUTE ONLY): 47 min   Charges:   PT Evaluation $PT Eval Low Complexity: 1 Low PT Treatments $Gait Training: 8-22 mins $Therapeutic Activity: 8-22 mins        01/21/2021  Ginger Carne., PT Acute Rehabilitation Services [phone]  (pager) [phone]  (office)  Tessie Fass Auriah Hollings 01/21/2021, 10:59 AM

## 2021-01-21 NOTE — Progress Notes (Addendum)
CLINICAL UPDATE:  Patient's blood pressure dropped from systolic of 485 to 462 over approximately 2 hours. Dr. Collene Gobble and I evaluated the patient at the bedside and noted the patient to be significantly more lethargic compared to our previous evaluation of her. She did not arouse to name, but she did awake to sternal rub. However, she would not keep her eyes open for very long. Initially, the patient was not able to state her name or where she is. She would only mumble "yes" and "no" at that time. She was also intermittently following commands. She would open her eyes and her pupils were reactive to light, but would not follow commands for EOM testing or strength testing. Code stroke was called.   Stroke team arrived and after a few minutes, the patient's blood pressure started to increase, with systolic in the 703-500X. She became more arousable and would awake to her name being called. She was then able to state her name, birthday, location, and the current year. Speech was noted to be clear and not aphasic. Cranial nerve exam was intact upon testing. She was able to follow commands at this time and bilateral upper and lower extremity strength was noted to be equal. Finger to nose testing equal bilaterally, as well. Initial exam likely secondary to cerebral hypoperfusion, but stroke less likely at this time.   Plan: - CT head ordered - 1 L LR bolus to be given  - Will avoid hydralazine for BP

## 2021-01-22 DIAGNOSIS — J9601 Acute respiratory failure with hypoxia: Secondary | ICD-10-CM | POA: Diagnosis not present

## 2021-01-22 DIAGNOSIS — A419 Sepsis, unspecified organism: Secondary | ICD-10-CM | POA: Diagnosis not present

## 2021-01-22 DIAGNOSIS — R6520 Severe sepsis without septic shock: Secondary | ICD-10-CM

## 2021-01-22 LAB — BASIC METABOLIC PANEL
Anion gap: 7 (ref 5–15)
BUN: 27 mg/dL — ABNORMAL HIGH (ref 8–23)
CO2: 25 mmol/L (ref 22–32)
Calcium: 9.5 mg/dL (ref 8.9–10.3)
Chloride: 102 mmol/L (ref 98–111)
Creatinine, Ser: 0.69 mg/dL (ref 0.44–1.00)
GFR, Estimated: >60 mL/min (ref >60)
Glucose, Bld: 145 mg/dL — ABNORMAL HIGH (ref 70–99)
Potassium: 3.9 mmol/L (ref 3.5–5.1)
Sodium: 134 mmol/L — ABNORMAL LOW (ref 135–145)

## 2021-01-22 LAB — HEPATITIS PANEL, ACUTE
HCV Ab: NONREACTIVE
Hep A IgM: NONREACTIVE
Hep B C IgM: NONREACTIVE
Hepatitis B Surface Ag: NONREACTIVE

## 2021-01-22 LAB — CBC
HCT: 38.7 % (ref 36.0–46.0)
Hemoglobin: 13.3 g/dL (ref 12.0–15.0)
MCH: 30 pg (ref 26.0–34.0)
MCHC: 34.4 g/dL (ref 30.0–36.0)
MCV: 87.4 fL (ref 80.0–100.0)
Platelets: 231 10*3/uL (ref 150–400)
RBC: 4.43 MIL/uL (ref 3.87–5.11)
RDW: 13.4 % (ref 11.5–15.5)
WBC: 10.1 10*3/uL (ref 4.0–10.5)
nRBC: 0 % (ref 0.0–0.2)

## 2021-01-22 LAB — URINE CULTURE: Culture: >=100000 — AB

## 2021-01-22 LAB — BRAIN NATRIURETIC PEPTIDE: B Natriuretic Peptide: 317.7 pg/mL — ABNORMAL HIGH (ref 0.0–100.0)

## 2021-01-22 LAB — MRSA NEXT GEN BY PCR, NASAL: MRSA by PCR Next Gen: NOT DETECTED

## 2021-01-22 LAB — LACTIC ACID, PLASMA: Lactic Acid, Venous: 1.4 mmol/L (ref 0.5–1.9)

## 2021-01-22 MED ORDER — IPRATROPIUM-ALBUTEROL 0.5-2.5 (3) MG/3ML IN SOLN
3.0000 mL | RESPIRATORY_TRACT | Status: DC
  Administered 2021-01-22 (×4): 3 mL via RESPIRATORY_TRACT
  Filled 2021-01-22 (×4): qty 3

## 2021-01-22 MED ORDER — PIPERACILLIN-TAZOBACTAM 3.375 G IVPB
3.3750 g | Freq: Three times a day (TID) | INTRAVENOUS | Status: DC
  Administered 2021-01-22 – 2021-01-24 (×7): 3.375 g via INTRAVENOUS
  Filled 2021-01-22 (×7): qty 50

## 2021-01-22 MED ORDER — IPRATROPIUM-ALBUTEROL 0.5-2.5 (3) MG/3ML IN SOLN
3.0000 mL | Freq: Four times a day (QID) | RESPIRATORY_TRACT | Status: DC
  Administered 2021-01-23: 3 mL via RESPIRATORY_TRACT
  Filled 2021-01-22: qty 3

## 2021-01-22 NOTE — Consult Note (Signed)
Pharmacy Antibiotic Note  Courtney Castro is a 87 y.o. female admitted on 01/20/2021 with sepsis secondary to worsening PNA.  Pharmacy has been consulted for Zosyn dosing. Labs show a PCT level of 18, lactic acid normal, WBC count down to 10.1 from 11.2 yesterday. However, CXR shows worsening pneumonia. Her O2 requirements have been increasing up to 6L on HFNC. Renal function looks stable, Scr at baseline.Patient recently visited a spa in [place], indicating need for Pseudomonas coverage.   Plan: Zosyn 3.375g IV q8h (4 hour infusion). Monitor renal function  Height: 5\' 4"  (162.6 cm) Weight: 63.3 kg (139 lb 8.8 oz) IBW/kg (Calculated) : 54.7  Temp (24hrs), Avg:98.7 F (37.1 C), Min:98.1 F (36.7 C), Max:99 F (37.2 C)  Recent Labs  Lab 01/20/21 0939 01/20/21 0950 01/20/21 1139 01/21/21 0451 01/22/21 0153  WBC  --  11.8*  --  11.2* 10.1  CREATININE  --  0.90  --  0.73 0.69  LATICACIDVEN 1.7  --  1.6  --  1.4    Estimated Creatinine Clearance: 44.4 mL/min (by C-G formula based on SCr of 0.69 mg/dL).    Allergies  Allergen Reactions   Percocet [Oxycodone-Acetaminophen] Other (See Comments)    Upset stomach    Antimicrobials this admission: Azithromycin 500mg  IV 10/29 >> 10/30 Ceftriaxone 1g 10/29 >> 10/30   Microbiology results: 10/28 BCx: NGTD 10/28 UCx: E.Coli  10/29 MRSA PCR: negative  Thank you for allowing pharmacy to be a part of this patient's care.  Loreta Ave 01/22/2021 3:16 PM

## 2021-01-22 NOTE — Progress Notes (Signed)
Unable to collect a clean urine sample. Pt has BM every time she voids.

## 2021-01-22 NOTE — Progress Notes (Signed)
Dark PROGRESS NOTE    Courtney Castro  MEQ:[id number] DOB: 30-May-1933 DOA: 01/20/2021 PCP: Rita Ohara, MD   Brief Narrative/Hospital Course: Courtney Castro, 87 y.o. female with PMH of  Hypertension HLD, subdural and subarachnoid bleeding, history of breast cancer in remission presents to the ED with malaise, shortness of breath not feeling well ever since her trip from [place] past week, return on Thursday, October 20.  She complains of having a fall while in [place]'s that was minimal with scratches.  She has been complaining of malaise generalized weakness in addition to dyspnea at rest and with exertion feeling warm but no known fever no chest pain.  She was seen in the ED febrile 103 blood pressure 139/84 heart rate 112 saturating 91% room air lab with leukocytosis 11.1, hyponatremia hypokalemia AST 65 ALT 49 total bili 1.9 COVID-negative lactic acid 1.7 chest x-ray consolidation of the left lower lobe and bilateral upper lobes multifocal infection, CT head was negative patient was placed on antibiotics and admitted under teaching service Since admissions he has been running high blood pressure in 200s needing hydralazine doses 10 mg x 2 last was at 8 PM and around 9 PM blood pressure still high at 197 then at 10 PM systolic blood pressure was low at 104 and she became transiently unresponsive code stroke was activated and seen by neurology-CT head was done no acute finding, advised IV fluid hydration hypotension work-up and neurology available as needed  Subjective: Seen and examined this morning.  Patient was sleeping woke up on calling, reports she feels better this morning, does not recall of the events overnight. Overnight  patient needing 6 L HFNC and increased work of breathing, cxr w/ increased pneumonia,, ivf discontinued.  Briefly down to 2 L yesterday afternoon This morning on 5 L  Assessment & Plan:  Severe Sepsis POA Multifocal pneumonia: Met severe sepsis Criteria with fever  tachycardia tachypnea with pneumonia and hypoxia. HFNC was down to 2l but bumped overnight to 6l  and had increased WOB-chest x-ray 1/ worsening pneumonia. Significantly elevated procalcitonin 18.7. On Ceftriaxone/ azithromycin. Blood culture  NGTD FU urinary antigens and sputum culture. Recent Labs  Lab 01/20/21 0939 01/20/21 0950 01/20/21 1139 01/20/21 1441 01/21/21 0451 01/22/21 0153  WBC  --  11.8*  --   --  11.2* 10.1  LATICACIDVEN 1.7  --  1.6  --   --  1.4  PROCALCITON  --   --   --  18.74  --   --    Acute hypoxic respiratory failure Acute respiratory distress with increased work of breathing: Overnight needing more oxygen on 6 L HFNC chest x-ray worsening perihilar pneumonia.  Hold off on IV fluids.  Lactic acid is normal.  Continue [place], Pulmonary toileting, continued progressiVE BED.  Monitor closely and consult pulmonary if further worsening and hypoxia.  Echo with G1 DD and  LVEF.Follow-up BNP.  E. coli UTI:  con antibiotics with ceftriaxone follow-up culture  Demand ischemia /elevated troponin 297>989: Elevated troponin likely due to demand ischemia in the setting of sepsis/hypoxia and accelerated hypertension.  Troponin downtrending.  Not complain of chest pain.  Echo showed EF 55 to 60%, no R WMA, G1 DD, small pericardial effusion.EKG- ST non sp st changes.   Elevated D-dimer 4.4 with negative CT angio chest for PE and negative for DVT and duplex   Uncontrolled/accelerated hypertension: On admission BP very high in 190s to 200s needing multiple doses of IV hydralazine> that cased transient hypotension-patient was  symptomatic with unresponsiveness. Resumed atenolol,If still up resume losarta/HCTZ.  Hypokalemia: Resolved Hypovolemic hyponatremia: ivf discontinued due to respiratory status, monitor  Mild transaminitis AST 82 ALT 56 TB 1.4.  Follow-up acute bilateral hepatitis panel. Cont to hold statin Dehydration with ketones in urine: Encourage oral intake, off fluids.     HLD on statin at home.  Holding for now  Code stroke in the setting of acute hypotension from hydralazine: resolved. CT brain no acute finding.  Seen by neurology.  If recurrent episodes or somnolence can obtain MRI.  Minimize sudden drop in blood pressure, discontinued hydralazine IV.  She is nonfocal on exam.  Hypothyroidism continue Synthroid  General weakness fall at home deconditioning debility: In the setting of infection.  PT OT evaluation as tolerated  Goals of care DNR on admission.  Monitor closely prognosis remains to be seen patient having increased work of breathing respiratory status needing high flow nasal cannula.  DVT prophylaxis: enoxaparin (LOVENOX) injection 40 mg Start: 01/20/21 1600 Code Status:   Code Status: DNR Family Communication: plan of care discussed with patient at bedside. Status is: admitted as Observation> changed to inpatient due to respiratory distress respiratory failure severe sepsis Medically not stable Anticipated dispo > 3 days  Objective: Vitals last 24 hrs: Vitals:   01/22/21 0414 01/22/21 0752 01/22/21 0834 01/22/21 0849  BP: (!) 163/84 (!) 166/91 (!) 159/89   Pulse: 90 86  85  Resp:  19  (!) 28  Temp: 98.6 F (37 C) 98.1 F (36.7 C)    TempSrc: Axillary Axillary    SpO2:  98%  97%  Weight:      Height:       Weight change:   Intake/Output Summary (Last 24 hours) at 01/22/2021 1110 Last data filed at 01/21/2021 1900 Gross per 24 hour  Intake 112.18 ml  Output 2 ml  Net 110.18 ml   Net IO Since Admission: 107.18 mL [01/22/21 1110]   Physical Examination: General exam: AAO, appears weak and mildly confused  HEENT:Oral mucosa moist, Ear/Nose WNL grossly, dentition normal. Respiratory system: bilaterally basal crackles, tachypneic  Cardiovascular system: S1 & S2 +, No JVD,. Gastrointestinal system: Abdomen soft,NT,ND, BS+ Nervous System:Alert, awake, moving extremities and grossly nonfocal Extremities: no edema, distal  peripheral pulses palpable.  Skin: No rashes,no icterus. MSK: Normal muscle bulk,tone, power   Medications reviewed:  Scheduled Meds:  atenolol  25 mg Oral Daily   enoxaparin (LOVENOX) injection  40 mg Subcutaneous Q24H   ipratropium-albuterol  3 mL Nebulization Q4H WA   levothyroxine  25 mcg Oral Q0600   lidocaine  1 patch Transdermal Q24H   sodium chloride flush  3 mL Intravenous Q12H   Continuous Infusions:  azithromycin 500 mg (01/21/21 1349)   cefTRIAXone (ROCEPHIN)  IV 1 g (01/21/21 1245)   Diet Order             Diet regular Room service appropriate? Yes; Fluid consistency: Thin  Diet effective now                          Weight change:   Wt Readings from Last 3 Encounters:  01/20/21 63.3 kg  01/19/21 63.1 kg  01/16/21 63 kg     Consultants:see note  Procedures:see note Antimicrobials: Anti-infectives (From admission, onward)    Start     Dose/Rate Route Frequency Ordered Stop   01/21/21 1100  azithromycin (ZITHROMAX) 500 mg in sodium chloride 0.9 % 250 mL IVPB  500 mg 250 mL/hr over 60 Minutes Intravenous Every 24 hours 01/20/21 1334 01/23/21 1059   01/21/21 1100  cefTRIAXone (ROCEPHIN) 1 g in sodium chloride 0.9 % 100 mL IVPB        1 g 200 mL/hr over 30 Minutes Intravenous Every 24 hours 01/20/21 1334 01/25/21 1059   01/20/21 1130  cefTRIAXone (ROCEPHIN) 1 g in sodium chloride 0.9 % 100 mL IVPB        1 g 200 mL/hr over 30 Minutes Intravenous  Once 01/20/21 1120 01/20/21 1247   01/20/21 1130  azithromycin (ZITHROMAX) 500 mg in sodium chloride 0.9 % 250 mL IVPB        500 mg 250 mL/hr over 60 Minutes Intravenous  Once 01/20/21 1120 01/20/21 1458      Culture/Microbiology    Component Value Date/Time   SDES URINE, CLEAN CATCH 01/20/2021 1209   SPECREQUEST NONE 01/20/2021 1209   CULT (A) 01/20/2021 1209    >=100,000 COLONIES/mL ESCHERICHIA COLI SUSCEPTIBILITIES TO FOLLOW Performed at [facility] [address]  [place], [place] [address]    REPTSTATUS PENDING 01/20/2021 1209    Other culture-see note  Unresulted Labs (From admission, onward)     Start     Ordered   01/22/21 5284  Basic metabolic panel  Daily,   R     Question:  Specimen collection method  Answer:  Lab=Lab collect   01/21/21 0904   01/22/21 0500  CBC  Daily,   R     Question:  Specimen collection method  Answer:  Lab=Lab collect   01/21/21 0904   01/21/21 2345  Brain natriuretic peptide  Once,   R        01/21/21 2344   01/21/21 2342  Strep pneumoniae urinary antigen  Once,   R        01/21/21 2341   01/21/21 0905  Expectorated Sputum Assessment w Gram Stain, Rflx to Resp Cult  Once,   R        01/21/21 0904   01/20/21 1545  Legionella Pneumophila Serogp 1 Ur Ag  Once,   R        01/20/21 1544           Data Reviewed: I have personally reviewed following labs and imaging studies CBC: Recent Labs  Lab 01/20/21 0950 01/21/21 0451 01/22/21 0153  WBC 11.8* 11.2* 10.1  NEUTROABS 11.1* 10.6*  --   HGB 14.6 13.3 13.3  HCT 40.8 39.4 38.7  MCV 85.9 88.1 87.4  PLT 212 213 132   Basic Metabolic Panel: Recent Labs  Lab 01/20/21 0950 01/21/21 0451 01/22/21 0153  NA 127* 132* 134*  K 3.2* 3.1* 3.9  CL 93* 98 102  CO2 21* 24 25  GLUCOSE 136* 113* 145*  BUN 26* 23 27*  CREATININE 0.90 0.73 0.69  CALCIUM 9.4 9.1 9.5   GFR: Estimated Creatinine Clearance: 44.4 mL/min (by C-G formula based on SCr of 0.69 mg/dL). Liver Function Tests: Recent Labs  Lab 01/20/21 0950 01/21/21 0451  AST 65* 82*  ALT 49* 56*  ALKPHOS 108 88  BILITOT 1.9* 1.4*  PROT 5.7* 5.0*  ALBUMIN 2.9* 2.3*   No results for input(s): LIPASE, AMYLASE in the last 168 hours. No results for input(s): AMMONIA in the last 168 hours. Coagulation Profile: Recent Labs  Lab 01/20/21 0950  INR 1.1   Cardiac Enzymes: No results for input(s): CKTOTAL, CKMB, CKMBINDEX, TROPONINI in the last 168 hours. BNP (last 3 results)  No results for input(s):  PROBNP in the last 8760 hours. HbA1C: Recent Labs    01/20/21 1441  HGBA1C 5.7*   CBG: Recent Labs  Lab 01/20/21 2358  GLUCAP 130*   Lipid Profile: No results for input(s): CHOL, HDL, LDLCALC, TRIG, CHOLHDL, LDLDIRECT in the last 72 hours. Thyroid Function Tests: No results for input(s): TSH, T4TOTAL, FREET4, T3FREE, THYROIDAB in the last 72 hours. Anemia Panel: No results for input(s): VITAMINB12, FOLATE, FERRITIN, TIBC, IRON, RETICCTPCT in the last 72 hours. Sepsis Labs: Recent Labs  Lab 01/20/21 7588 01/20/21 1139 01/20/21 1441 01/22/21 0153  PROCALCITON  --   --  18.74  --   LATICACIDVEN 1.7 1.6  --  1.4    Recent Results (from the past 240 hour(s))  Resp Panel by RT-PCR (Flu A&B, Covid) Nasopharyngeal Swab     Status: None   Collection Time: 01/20/21  9:49 AM   Specimen: Nasopharyngeal Swab; Nasopharyngeal(NP) swabs in vial transport medium  Result Value Ref Range Status   SARS Coronavirus 2 by RT PCR NEGATIVE NEGATIVE Final    Comment: (NOTE) SARS-CoV-2 target nucleic acids are NOT DETECTED.  The SARS-CoV-2 RNA is generally detectable in upper respiratory specimens during the acute phase of infection. The lowest concentration of SARS-CoV-2 viral copies this assay can detect is 138 copies/mL. A negative result does not preclude SARS-Cov-2 infection and should not be used as the sole basis for treatment or other patient management decisions. A negative result may occur with  improper specimen collection/handling, submission of specimen other than nasopharyngeal swab, presence of viral mutation(s) within the areas targeted by this assay, and inadequate number of viral copies(<138 copies/mL). A negative result must be combined with clinical observations, patient history, and epidemiological information. The expected result is Negative.  Fact Sheet for Patients:  EntrepreneurPulse.com.au  Fact Sheet for Healthcare Providers:   IncredibleEmployment.be  This test is no t yet approved or cleared by the [place] FDA and  has been authorized for detection and/or diagnosis of SARS-CoV-2 by FDA under an Emergency Use Authorization (EUA). This EUA will remain  in effect (meaning this test can be used) for the duration of the COVID-19 declaration under Section 564(b)(1) of the Act, 21 U.S.C.section 360bbb-3(b)(1), unless the authorization is terminated  or revoked sooner.       Influenza A by PCR NEGATIVE NEGATIVE Final   Influenza B by PCR NEGATIVE NEGATIVE Final    Comment: (NOTE) The Xpert Xpress SARS-CoV-2/FLU/RSV plus assay is intended as an aid in the diagnosis of influenza from Nasopharyngeal swab specimens and should not be used as a sole basis for treatment. Nasal washings and aspirates are unacceptable for Xpert Xpress SARS-CoV-2/FLU/RSV testing.  Fact Sheet for Patients: EntrepreneurPulse.com.au  Fact Sheet for Healthcare Providers: IncredibleEmployment.be  This test is not yet approved or cleared by the [place] FDA and has been authorized for detection and/or diagnosis of SARS-CoV-2 by FDA under an Emergency Use Authorization (EUA). This EUA will remain in effect (meaning this test can be used) for the duration of the COVID-19 declaration under Section 564(b)(1) of the Act, 21 U.S.C. section 360bbb-3(b)(1), unless the authorization is terminated or revoked.  Performed at [facility], [place] [address], [place], [place] [address]   Culture, blood (Routine x 2)     Status: None (Preliminary result)   Collection Time: 01/20/21  9:50 AM   Specimen: BLOOD LEFT HAND  Result Value Ref Range Status   Specimen Description BLOOD LEFT HAND  Final  Special Requests   Final    BOTTLES DRAWN AEROBIC AND ANAEROBIC Blood Culture adequate volume   Culture   Final    NO GROWTH 2 DAYS Performed at [facility], [place] [address],  [place], [place] [address]    Report Status PENDING  Incomplete  Culture, blood (Routine x 2)     Status: None (Preliminary result)   Collection Time: 01/20/21 11:47 AM   Specimen: BLOOD  Result Value Ref Range Status   Specimen Description BLOOD LEFT ANTECUBITAL  Final   Special Requests   Final    BOTTLES DRAWN AEROBIC ONLY Blood Culture adequate volume   Culture   Final    NO GROWTH 2 DAYS Performed at [facility], [place] [address] [place], [place] [address]    Report Status PENDING  Incomplete  Urine Culture     Status: Abnormal (Preliminary result)   Collection Time: 01/20/21 12:09 PM   Specimen: Urine, Clean Catch  Result Value Ref Range Status   Specimen Description URINE, CLEAN CATCH  Final   Special Requests NONE  Final   Culture (A)  Final    >=100,000 COLONIES/mL ESCHERICHIA COLI SUSCEPTIBILITIES TO FOLLOW Performed at [facility], [address], [place], [place] [address]    Report Status PENDING  Incomplete  Respiratory (~20 pathogens) panel by PCR     Status: None   Collection Time: 01/20/21  3:45 PM   Specimen: Nasopharyngeal Swab; Respiratory  Result Value Ref Range Status   Adenovirus NOT DETECTED NOT DETECTED Final   Coronavirus 229E NOT DETECTED NOT DETECTED Final    Comment: (NOTE) The Coronavirus on the Respiratory Panel, DOES NOT test for the novel  Coronavirus (2019 nCoV)    Coronavirus HKU1 NOT DETECTED NOT DETECTED Final   Coronavirus NL63 NOT DETECTED NOT DETECTED Final   Coronavirus OC43 NOT DETECTED NOT DETECTED Final   Metapneumovirus NOT DETECTED NOT DETECTED Final   Rhinovirus / Enterovirus NOT DETECTED NOT DETECTED Final   Influenza A NOT DETECTED NOT DETECTED Final   Influenza B NOT DETECTED NOT DETECTED Final   Parainfluenza Virus 1 NOT DETECTED NOT DETECTED Final   Parainfluenza Virus 2 NOT DETECTED NOT DETECTED Final   Parainfluenza Virus 3 NOT DETECTED NOT DETECTED Final   Parainfluenza Virus 4 NOT DETECTED NOT DETECTED Final    Respiratory Syncytial Virus NOT DETECTED NOT DETECTED Final   Bordetella pertussis NOT DETECTED NOT DETECTED Final   Bordetella Parapertussis NOT DETECTED NOT DETECTED Final   Chlamydophila pneumoniae NOT DETECTED NOT DETECTED Final   Mycoplasma pneumoniae NOT DETECTED NOT DETECTED Final    Comment: Performed at [facility], [place] [address] [place], [place] [address]  MRSA Next Gen by PCR, Nasal     Status: None   Collection Time: 01/21/21 11:41 PM   Specimen: Nasal Mucosa; Nasal Swab  Result Value Ref Range Status   MRSA by PCR Next Gen NOT DETECTED NOT DETECTED Final    Comment: (NOTE) The GeneXpert MRSA Assay (FDA approved for NASAL specimens only), is one component of a comprehensive MRSA colonization surveillance program. It is not intended to diagnose MRSA infection nor to guide or monitor treatment for MRSA infections. Test performance is not FDA approved in patients less than 77 years old. Performed at [facility], [place] [address], [place], [place] [address]      Radiology Studies: CT HEAD WO CONTRAST (5MM)  Result Date: 01/21/2021 CLINICAL DATA:  Initial evaluation  for lethargy, cerebral hemorrhage suspected. EXAM: CT HEAD WITHOUT CONTRAST TECHNIQUE: Contiguous axial images were obtained from the base of the skull through the vertex without intravenous contrast. COMPARISON:  Prior CT from 01/20/2021. FINDINGS: Brain: Cerebral volume within normal limits for age. Probable minor chronic microvascular ischemic disease, also felt to be within normal limits for age. Small focus of chronic encephalomalacia noted at the anterior parasagittal left frontal lobe, which could be related to prior trauma and/or ischemia (series 3, image 19). No acute intracranial hemorrhage. No acute large vessel territory infarct. 1 cm right parafalcine meningioma along the anterior falx without associated mass effect (series 3, image 14). No other mass lesion, mass effect or midline shift. No  hydrocephalus or extra-axial fluid collection. Vascular: No height asymmetric hyperdense vessel. Calcified atherosclerosis present at skull base. Skull: Scalp soft tissues within normal limits.  Calvarium intact. Sinuses/Orbits: Prior bilateral ocular lens replacement. Globes and orbital soft tissues demonstrate no acute finding. Paranasal sinuses are largely clear. No mastoid effusion. Other: Osteoarthritic changes noted about the TMJs bilaterally. IMPRESSION: 1. No acute intracranial abnormality. 2. Small focus of chronic encephalomalacia at the anterior parasagittal left frontal lobe, which could be related to prior trauma and/or ischemia. 3. 1 cm right parafalcine meningioma without mass effect. Electronically Signed   By: Jeannine Boga M.D.   On: 01/21/2021 02:40   CT Angio Chest Pulmonary Embolism (PE) W or WO Contrast  Result Date: 01/20/2021 CLINICAL DATA:  Sepsis, positive D-dimer EXAM: CT ANGIOGRAPHY CHEST WITH CONTRAST TECHNIQUE: Multidetector CT imaging of the chest was performed using the standard protocol during bolus administration of intravenous contrast. Multiplanar CT image reconstructions and MIPs were obtained to evaluate the vascular anatomy. CONTRAST:  9m OMNIPAQUE IOHEXOL 350 MG/ML SOLN COMPARISON:  Chest radiographs dated 01/12/2021 FINDINGS: Cardiovascular: Satisfactory opacification the bilateral pulmonary arteries to the segmental level. No evidence of pulmonary embolism. Although not tailored for evaluation of the thoracic aorta, there is no evidence of thoracic aortic aneurysm or dissection. Heart is normal in size.  No pericardial effusion. Three vessel coronary atherosclerosis. Mediastinum/Nodes: No suspicious mediastinal lymphadenopathy. Visualized thyroid is unremarkable. Lungs/Pleura: Multifocal patchy opacities in the left upper lobe/lingula and bilateral lower lobes, left lower lobe predominant, suspicious for multifocal pneumonia. Small bilateral pleural effusions.  Evaluation of the lung parenchyma is constrained by respiratory motion. Within that constraint, there are no suspicious pulmonary nodules. No pneumothorax. Upper Abdomen: Visualized upper abdomen is notable for prior cholecystectomy and vascular calcifications. Musculoskeletal: Mild degenerative changes of the lower thoracic/upper lumbar spine. Review of the MIP images confirms the above findings. IMPRESSION: No evidence of pulmonary embolism. Multifocal pneumonia, left lower lobe predominant. Small bilateral pleural effusions. Aortic Atherosclerosis (ICD10-I70.0). Electronically Signed   By: SJulian HyM.D.   On: 01/20/2021 22:15   DG CHEST PORT 1 VIEW  Result Date: 01/21/2021 CLINICAL DATA:  Pneumonia. EXAM: PORTABLE CHEST 1 VIEW COMPARISON:  Radiograph and CT yesterday. FINDINGS: There is worsening left perihilar airspace disease. Small pleural effusions. Unchanged right basilar opacity and scattered atelectasis. The heart is normal in size. Aortic atherosclerosis. No pneumothorax or developing pulmonary edema. Right axillary surgical clips. IMPRESSION: 1. Worsening pneumonia in the left perihilar lung. 2. Unchanged small pleural effusions and right lung base opacity. Electronically Signed   By: MKeith RakeM.D.   On: 01/21/2021 23:01   ECHOCARDIOGRAM COMPLETE  Result Date: 01/21/2021    ECHOCARDIOGRAM REPORT   Patient Name:   KMERRIDITH DERSHEMDate of Exam: 01/21/2021 Medical Rec #:  [id number]         Height:       64.0 in Accession #:    [id number]        Weight:       139.6 lb Date of Birth:  [birth year]         BSA:          1.679 m Patient Age:    56 years          BP:           146/71 mmHg Patient Gender: F                 HR:           124 bpm. Exam Location:  Inpatient Procedure: 2D Echo, Cardiac Doppler and Color Doppler Indications:    Elevated troponin  History:        Patient has no prior history of Echocardiogram examinations.                 Risk Factors:Hypertension.   Sonographer:    Jyl Heinz Referring Phys: [id number] [place] IMPRESSIONS  1. Left ventricular ejection fraction, by estimation, is 55 to 60%. The left ventricle has normal function. The left ventricle has no regional wall motion abnormalities. Left ventricular diastolic parameters are consistent with Grade I diastolic dysfunction (impaired relaxation).  2. Right ventricular systolic function is hyperdynamic. The right ventricular size is normal.  3. A small pericardial effusion is present. The pericardial effusion is circumferential. Suspect no significant respiophasic chagne in mitral inflow velocity (respirometer note used). No RV or RA collapse but RV does not fully expand. Moderate pleural effusion.  4. The mitral valve is grossly normal. No evidence of mitral valve regurgitation. No evidence of mitral stenosis.  5. The aortic valve is tricuspid. There is moderate calcification of the aortic valve. Aortic valve regurgitation is not visualized. Mild aortic valve sclerosis is present, with no evidence of aortic valve stenosis.  6. The inferior vena cava is normal in size with <50% respiratory variability, suggesting right atrial pressure of 8 mmHg. Comparison(s): No prior Echocardiogram. Conclusion(s)/Recommendation(s): Consider repeat limited echocardiogram for effusion in 48 hours or if change in clinical condition. FINDINGS  Left Ventricle: Left ventricular ejection fraction, by estimation, is 55 to 60%. The left ventricle has normal function. The left ventricle has no regional wall motion abnormalities. The left ventricular internal cavity size was normal in size. There is  no left ventricular hypertrophy. Left ventricular diastolic parameters are consistent with Grade I diastolic dysfunction (impaired relaxation). Right Ventricle: The right ventricular size is normal. No increase in right ventricular wall thickness. Right ventricular systolic function is hyperdynamic. Left Atrium: Left atrial size was  normal in size. Right Atrium: Right atrial size was normal in size. Pericardium: A small pericardial effusion is present. The pericardial effusion is circumferential. Mitral Valve: The mitral valve is grossly normal. Mild mitral annular calcification. No evidence of mitral valve regurgitation. No evidence of mitral valve stenosis. Tricuspid Valve: The tricuspid valve is normal in structure. Tricuspid valve regurgitation is mild. Aortic Valve: The aortic valve is tricuspid. There is moderate calcification of the aortic valve. Aortic valve regurgitation is not visualized. Mild aortic valve sclerosis is present, with no evidence of aortic valve stenosis. Aortic valve peak gradient measures 9.3 mmHg. Pulmonic Valve: The pulmonic valve was not well visualized. Pulmonic valve regurgitation is not visualized. No evidence of pulmonic stenosis. Aorta: The aortic root and ascending aorta are structurally normal,  with no evidence of dilitation. Venous: The inferior vena cava is normal in size with less than 50% respiratory variability, suggesting right atrial pressure of 8 mmHg. IAS/Shunts: The atrial septum is grossly normal. Additional Comments: There is a moderate pleural effusion.  LEFT VENTRICLE PLAX 2D LVIDd:         4.30 cm     Diastology LVIDs:         2.80 cm     LV e' medial:    12.00 cm/s LV PW:         0.80 cm     LV E/e' medial:  6.8 LV IVS:        0.90 cm     LV e' lateral:   14.10 cm/s LVOT diam:     2.00 cm     LV E/e' lateral: 5.8 LV SV:         64 LV SV Index:   38 LVOT Area:     3.14 cm  LV Volumes (MOD) LV vol d, MOD A2C: 73.4 ml LV vol d, MOD A4C: 68.3 ml LV vol s, MOD A2C: 32.5 ml LV vol s, MOD A4C: 29.0 ml LV SV MOD A2C:     40.9 ml LV SV MOD A4C:     68.3 ml LV SV MOD BP:      41.3 ml RIGHT VENTRICLE             IVC RV Basal diam:  3.10 cm     IVC diam: 1.70 cm RV Mid diam:    2.20 cm RV S prime:     12.70 cm/s TAPSE (M-mode): 1.6 cm LEFT ATRIUM             Index        RIGHT ATRIUM          Index LA  diam:        3.50 cm 2.08 cm/m   RA Area:     8.54 cm LA Vol (A2C):   39.4 ml 23.47 ml/m  RA Volume:   14.80 ml 8.82 ml/m LA Vol (A4C):   27.5 ml 16.38 ml/m LA Biplane Vol: 35.4 ml 21.09 ml/m  AORTIC VALVE AV Area (Vmax): 2.24 cm AV Vmax:        152.50 cm/s AV Peak Grad:   9.3 mmHg LVOT Vmax:      108.50 cm/s LVOT Vmean:     77.400 cm/s LVOT VTI:       0.203 m  AORTA Ao Root diam: 2.70 cm Ao Asc diam:  3.20 cm MITRAL VALVE                TRICUSPID VALVE MV Area (PHT): 5.09 cm     TR Peak grad:   36.7 mmHg MV Decel Time: 149 msec     TR Vmax:        303.00 cm/s MV E velocity: 81.90 cm/s MV A velocity: 124.00 cm/s  SHUNTS MV E/A ratio:  0.66         Systemic VTI:  0.20 m                             Systemic Diam: 2.00 cm Rudean Haskell MD Electronically signed by Rudean Haskell MD Signature Date/Time: 01/21/2021/3:38:26 PM    Final    VAS [place] LOWER EXTREMITY VENOUS (DVT)  Result Date: 01/22/2021  Lower Venous DVT Study Patient Name:  KEYASHA MIAH [facility]  Date of Exam:   01/21/2021 Medical Rec #: [id number]          Accession #:    [id number] Date of Birth: [birth year]          Patient Gender: F Patient Age:   73 years Exam Location:  [facility] Procedure:      VAS [place] LOWER EXTREMITY VENOUS (DVT) Referring Phys: Switzer Satina Jerrell --------------------------------------------------------------------------------  Indications: Swelling, and elevated D-Dimer.  Comparison Study: No prior study Performing Technologist: Darlin Coco RDMS, RVT  Examination Guidelines: A complete evaluation includes B-mode imaging, spectral Doppler, color Doppler, and power Doppler as needed of all accessible portions of each vessel. Bilateral testing is considered an integral part of a complete examination. Limited examinations for reoccurring indications may be performed as noted. The reflux portion of the exam is performed with the patient in reverse Trendelenburg.   +---------+---------------+---------+-----------+----------+--------------+ RIGHT    CompressibilityPhasicitySpontaneityPropertiesThrombus Aging +---------+---------------+---------+-----------+----------+--------------+ CFV      Full           Yes      Yes                                 +---------+---------------+---------+-----------+----------+--------------+ SFJ      Full                                                        +---------+---------------+---------+-----------+----------+--------------+ FV Prox  Full                                                        +---------+---------------+---------+-----------+----------+--------------+ FV Mid   Full                                                        +---------+---------------+---------+-----------+----------+--------------+ FV DistalFull                                                        +---------+---------------+---------+-----------+----------+--------------+ PFV      Full                                                        +---------+---------------+---------+-----------+----------+--------------+ POP      Full                                                        +---------+---------------+---------+-----------+----------+--------------+ PTV      Full                                                        +---------+---------------+---------+-----------+----------+--------------+  PERO     Full                                                        +---------+---------------+---------+-----------+----------+--------------+ Gastroc  Full                                                        +---------+---------------+---------+-----------+----------+--------------+   +---------+---------------+---------+-----------+----------+--------------+ LEFT     CompressibilityPhasicitySpontaneityPropertiesThrombus Aging  +---------+---------------+---------+-----------+----------+--------------+ CFV      Full           Yes      Yes                                 +---------+---------------+---------+-----------+----------+--------------+ SFJ      Full                                                        +---------+---------------+---------+-----------+----------+--------------+ FV Prox  Full                                                        +---------+---------------+---------+-----------+----------+--------------+ FV Mid   Full                                                        +---------+---------------+---------+-----------+----------+--------------+ FV DistalFull                                                        +---------+---------------+---------+-----------+----------+--------------+ PFV      Full                                                        +---------+---------------+---------+-----------+----------+--------------+ POP      Full           Yes      Yes                                 +---------+---------------+---------+-----------+----------+--------------+ PTV      Full                                                        +---------+---------------+---------+-----------+----------+--------------+  PERO     Full                                                        +---------+---------------+---------+-----------+----------+--------------+ Gastroc  Full                                                        +---------+---------------+---------+-----------+----------+--------------+     Summary: RIGHT: - There is no evidence of deep vein thrombosis in the lower extremity.  LEFT: - There is no evidence of deep vein thrombosis in the lower extremity.  - A cystic structure is found in the popliteal fossa.  *See table(s) above for measurements and observations. Electronically signed by Jamelle Haring on 01/22/2021 at 9:30:29 AM.     Final      LOS: 1 day   Antonieta Pert, MD [facility]  01/22/2021, 11:10 AM

## 2021-01-22 NOTE — Progress Notes (Signed)
MD paged. Pt  noted with fine crackles to bilateral lungs, increase WOB, RR 30s-40s, and SpO2 87-90% on 2L. Supplemental O2 increased to 5L on HFNC. WOB and tachypnea continued. Pt placed in high fowlers to assist with breathing. Pt is unable to speak in a full sentence with getting SOB.   MD d/c IVF and came to bedside to evaluate pt. At this time O2 noted to be increased to 6L. CXR ordered and labs placed.

## 2021-01-22 NOTE — Progress Notes (Signed)
RT instructed patient on the use of a flutter valve. Patient was able to demonstrate back good technique. 

## 2021-01-23 DIAGNOSIS — R778 Other specified abnormalities of plasma proteins: Secondary | ICD-10-CM | POA: Diagnosis not present

## 2021-01-23 DIAGNOSIS — R7989 Other specified abnormal findings of blood chemistry: Secondary | ICD-10-CM

## 2021-01-23 DIAGNOSIS — J189 Pneumonia, unspecified organism: Secondary | ICD-10-CM

## 2021-01-23 DIAGNOSIS — A419 Sepsis, unspecified organism: Secondary | ICD-10-CM | POA: Diagnosis not present

## 2021-01-23 LAB — BASIC METABOLIC PANEL
Anion gap: 8 (ref 5–15)
BUN: 27 mg/dL — ABNORMAL HIGH (ref 8–23)
CO2: 26 mmol/L (ref 22–32)
Calcium: 9.4 mg/dL (ref 8.9–10.3)
Chloride: 103 mmol/L (ref 98–111)
Creatinine, Ser: 0.71 mg/dL (ref 0.44–1.00)
GFR, Estimated: >60 mL/min (ref >60)
Glucose, Bld: 123 mg/dL — ABNORMAL HIGH (ref 70–99)
Potassium: 4.2 mmol/L (ref 3.5–5.1)
Sodium: 137 mmol/L (ref 135–145)

## 2021-01-23 LAB — CBC
HCT: 39.9 % (ref 36.0–46.0)
Hemoglobin: 13.4 g/dL (ref 12.0–15.0)
MCH: 30 pg (ref 26.0–34.0)
MCHC: 33.6 g/dL (ref 30.0–36.0)
MCV: 89.3 fL (ref 80.0–100.0)
Platelets: 222 10*3/uL (ref 150–400)
RBC: 4.47 MIL/uL (ref 3.87–5.11)
RDW: 13.8 % (ref 11.5–15.5)
WBC: 10.8 10*3/uL — ABNORMAL HIGH (ref 4.0–10.5)
nRBC: 0 % (ref 0.0–0.2)

## 2021-01-23 LAB — STREP PNEUMONIAE URINARY ANTIGEN: Strep Pneumo Urinary Antigen: NEGATIVE

## 2021-01-23 LAB — PROCALCITONIN: Procalcitonin: 3.63 ng/mL

## 2021-01-23 MED ORDER — IPRATROPIUM-ALBUTEROL 0.5-2.5 (3) MG/3ML IN SOLN
3.0000 mL | Freq: Three times a day (TID) | RESPIRATORY_TRACT | Status: DC
  Administered 2021-01-23 – 2021-01-24 (×2): 3 mL via RESPIRATORY_TRACT
  Filled 2021-01-23 (×2): qty 3

## 2021-01-23 MED ORDER — GUAIFENESIN ER 600 MG PO TB12
600.0000 mg | ORAL_TABLET | Freq: Two times a day (BID) | ORAL | Status: DC
  Administered 2021-01-23 – 2021-01-27 (×10): 600 mg via ORAL
  Filled 2021-01-23 (×11): qty 1

## 2021-01-23 MED ORDER — SODIUM CHLORIDE 0.9 % IV SOLN
500.0000 mg | INTRAVENOUS | Status: DC
  Administered 2021-01-23: 500 mg via INTRAVENOUS
  Filled 2021-01-23 (×2): qty 500

## 2021-01-23 MED ORDER — FUROSEMIDE 10 MG/ML IJ SOLN
20.0000 mg | Freq: Once | INTRAMUSCULAR | Status: AC
  Administered 2021-01-23: 20 mg via INTRAVENOUS
  Filled 2021-01-23: qty 2

## 2021-01-23 NOTE — Progress Notes (Signed)
Physical Therapy Treatment Patient Details Name: Courtney Castro MRN: [id number] DOB: Apr 07, 1933 Today's Date: 01/23/2021   History of Present Illness Pt is an 87 y/o female admitted 10/28 with malaise, SOB and generally not feeling well since return from trip to [place].  Work up showing severe sepsis due to multifocal PNA seen on imaging with respiratory failure needing 5L HFNC.  Noted demand ischemia with elevated troponin  168 now trending down at 137.  PMHx:  HTN, Brest CA in remission, SDH/SAH, IBS, macular degeneration.    PT Comments    Pt was fatigued from the start and present as progressively fatiguing throughout.  Emphasis on transitions, transfers to toilet for urinary incontinence, self-peri care, progression of gait stability, distance and stamina.    Recommendations for follow up therapy are one component of a multi-disciplinary discharge planning process, led by the attending physician.  Recommendations may be updated based on patient status, additional functional criteria and insurance authorization.  Follow Up Recommendations  Home health PT     Assistance Recommended at Discharge Frequent or constant Supervision/Assistance  Equipment Recommendations  Other (comment)    Recommendations for Other Services       Precautions / Restrictions Precautions Precautions: Fall     Mobility  Bed Mobility Overal bed mobility: Needs Assistance Bed Mobility: Supine to Sit     Supine to sit: Min assist          Transfers Overall transfer level: Needs assistance Equipment used: Rolling walker (2 wheels) Transfers: Sit to/from Stand;Stand Pivot Transfers Sit to Stand: Min assist;From elevated surface Stand pivot transfers: Min assist         General transfer comment: cues for hand placement, stability and boost assist, control of descent    Ambulation/Gait Ambulation/Gait assistance: Min assist Gait Distance (Feet): 40 Feet Assistive device: Rolling walker  (2 wheels) Gait Pattern/deviations: Step-through pattern   Gait velocity interpretation: <1.31 ft/sec, indicative of household ambulator General Gait Details: slow, unsteady, weak, guarded.  Quick to fatigue.   Stairs             Wheelchair Mobility    Modified Rankin (Stroke Patients Only)       Balance Overall balance assessment: Needs assistance Sitting-balance support: No upper extremity supported;Feet supported Sitting balance-Leahy Scale: Fair     Standing balance support: Bilateral upper extremity supported Standing balance-Leahy Scale: Fair                              Cognition Arousal/Alertness: Awake/alert Behavior During Therapy: WFL for tasks assessed/performed Overall Cognitive Status: Within Functional Limits for tasks assessed                                          Exercises      General Comments        Pertinent Vitals/Pain Pain Assessment: No/denies pain    Home Living                          Prior Function            PT Goals (current goals can now be found in the care plan section) Acute Rehab PT Goals PT Goal Formulation: With patient Time For Goal Achievement: 02/04/21 Potential to Achieve Goals: Good Progress towards PT goals: Not progressing toward goals -  comment (has not been given enough chances to sit up and wallk)    Frequency    Min 3X/week      PT Plan Current plan remains appropriate    Co-evaluation              AM-PAC PT "6 Clicks" Mobility   Outcome Measure  Help needed turning from your back to your side while in a flat bed without using bedrails?: A Little Help needed moving from lying on your back to sitting on the side of a flat bed without using bedrails?: A Little Help needed moving to and from a bed to a chair (including a wheelchair)?: A Little Help needed standing up from a chair using your arms (e.g., wheelchair or bedside chair)?: A  Little Help needed to walk in hospital room?: A Little Help needed climbing 3-5 steps with a railing? : A Lot 6 Click Score: 17    End of Session Equipment Utilized During Treatment: Oxygen Activity Tolerance: Patient tolerated treatment well;Patient limited by fatigue Patient left: in chair;with call bell/phone within reach;with chair alarm set Nurse Communication: Mobility status PT Visit Diagnosis: Other abnormalities of gait and mobility (R26.89);Unsteadiness on feet (R26.81);Difficulty in walking, not elsewhere classified (R26.2)     Time: 6761-9509 PT Time Calculation (min) (ACUTE ONLY): 25 min  Charges:  $Gait Training: 8-22 mins $Therapeutic Activity: 8-22 mins                     01/23/2021  Ginger Carne., PT Acute Rehabilitation Services [phone]  (pager) [phone]  (office)   Tessie Fass Obie Kallenbach 01/23/2021, 5:28 PM

## 2021-01-23 NOTE — Progress Notes (Signed)
Occupational Therapy Treatment Patient Details Name: Courtney Castro MRN: [id number] DOB: 11/13/1933 Today's Date: 01/23/2021   History of present illness Pt is an 87 y/o female admitted 10/28 with malaise, SOB and generally not feeling well since return from trip to [place].  Work up showing severe sepsis due to multifocal PNA seen on imaging with respiratory failure needing 5L HFNC.  Noted demand ischemia with elevated troponin  168 now trending down at 137.  PMHx:  HTN, Brest CA in remission, SDH/SAH, IBS, macular degeneration.   OT comments  Patient received in bed and agreeable to OT treatment.  Patient stated she felt fatigued but was willing to get to eob to perform grooming tasks. Patient required assistance to get to eob due to assistance needed for trunk.  Following grooming patient performed 2 stands from eob requiring min assist to stand and quickly fatigued . Patient was mod assist to return to supine due to assistance needed with BLEs. Acute OT to continue to follow.    Recommendations for follow up therapy are one component of a multi-disciplinary discharge planning process, led by the attending physician.  Recommendations may be updated based on patient status, additional functional criteria and insurance authorization.    Follow Up Recommendations  Home health OT    Assistance Recommended at Discharge Intermittent Supervision/Assistance  Equipment Recommendations  Other (comment)    Recommendations for Other Services      Precautions / Restrictions Precautions Precautions: Fall       Mobility Bed Mobility Overal bed mobility: Needs Assistance Bed Mobility: Supine to Sit;Sit to Supine     Supine to sit: Min assist Sit to supine: Mod assist   General bed mobility comments: patient had increased difficulty gettting to EOB and back to supine due to fatigue and decreased activity tolerance    Transfers Overall transfer level: Needs assistance     Sit to Stand:  Min assist;From elevated surface           General transfer comment: 2 stands performed from eob with min assist to stand to RW     Balance Overall balance assessment: Needs assistance Sitting-balance support: No upper extremity supported;Feet supported Sitting balance-Leahy Scale: Fair Sitting balance - Comments: able to perform grooming seated on eob   Standing balance support: Bilateral upper extremity supported Standing balance-Leahy Scale: Fair Standing balance comment: static standing performed with assistance to stand and min guard                           ADL either performed or assessed with clinical judgement   ADL Overall ADL's : Needs assistance/impaired     Grooming: Sitting;Supervision/safety;Brushing hair;Oral care;Wash/dry face;Wash/dry Nurse, mental health Details (indicate cue type and reason): performed seated on eob                               General ADL Comments: grooming performed from eob due to fatigue     Vision       Perception     Praxis      Cognition Arousal/Alertness: Awake/alert Behavior During Therapy: WFL for tasks assessed/performed Overall Cognitive Status: Within Functional Limits for tasks assessed                                 General Comments: patient able to follow directions with increased time  Exercises     Shoulder Instructions       General Comments      Pertinent Vitals/ Pain       Pain Assessment: No/denies pain  Home Living                                          Prior Functioning/Environment              Frequency  Min 2X/week        Progress Toward Goals  OT Goals(current goals can now be found in the care plan section)  Progress towards OT goals: Progressing toward goals  Acute Rehab OT Goals Patient Stated Goal: go home OT Goal Formulation: With patient Time For Goal Achievement: 02/04/21 Potential to Achieve Goals:  Good ADL Goals Pt Will Perform Lower Body Bathing: with modified independence;sitting/lateral leans;sit to/from stand Pt Will Perform Lower Body Dressing: with modified independence;sitting/lateral leans;sit to/from stand Pt Will Transfer to Toilet: with modified independence;ambulating Pt Will Perform Toileting - Clothing Manipulation and hygiene: with modified independence;sitting/lateral leans;sit to/from stand Additional ADL Goal #1: Pt will recall 3 energy conservation techniques that she can use at home.  Plan Discharge plan remains appropriate    Co-evaluation                 AM-PAC OT "6 Clicks" Daily Activity     Outcome Measure   Help from another person eating meals?: None Help from another person taking care of personal grooming?: A Little Help from another person toileting, which includes using toliet, bedpan, or urinal?: A Little Help from another person bathing (including washing, rinsing, drying)?: A Little Help from another person to put on and taking off regular upper body clothing?: A Little Help from another person to put on and taking off regular lower body clothing?: A Little 6 Click Score: 19    End of Session Equipment Utilized During Treatment: Gait belt;Rolling walker (2 wheels);Oxygen  OT Visit Diagnosis: Unsteadiness on feet (R26.81);Other abnormalities of gait and mobility (R26.89);Muscle weakness (generalized) (M62.81)   Activity Tolerance Patient limited by fatigue   Patient Left in bed;with call bell/phone within reach;with family/visitor present;with bed alarm set   Nurse Communication Mobility status        Time: 1132-1203 OT Time Calculation (min): 31 min  Charges: OT General Charges $OT Visit: 1 Visit OT Treatments $Self Care/Home Management : 23-37 mins  Lodema Hong, [place]  Pager [phone] Office [place] 01/23/2021, 12:47 PM

## 2021-01-23 NOTE — Progress Notes (Signed)
[facility]  PROGRESS NOTE  Courtney Castro UXN:[id number] DOB: [birth year] DOA: 01/20/2021 PCP: Rita Ohara, MD   Brief HPI:   87 year old female with history of hypertension, hyperlipidemia, subdural and subarachnoid bleeding, history of breast cancer in remission came to ED with malaise, shortness of breath not feeling well since her trip from [place] past week.  On Thursday January 20, 2021.  She complains having a fall while in [place].  In the ED chest x-ray showed consolidation of the left lower lobe and bilateral upper lobes, multifocal infection.  CT head was negative and patient was started on antibiotics.  Since admission she has been running high blood pressure in 200s needing hydralazine doses 10 mg x 2 last was at 8 PM and around 9 PM blood pressure still high at 197 then at 10 PM systolic blood pressure was low at 104 and she became transiently unresponsive code stroke was activated and seen by neurology-CT head was done no acute finding, advised IV fluid hydration hypotension work-up and neurology available as needed   Subjective   Patient seen and examined, still requiring 3.5 L of oxygen via nasal cannula.  Appears little volume overloaded.   Assessment/Plan:    Multifocal pneumonia Severe sepsis POA -Patient presented with severe sepsis criteria including fever, tachycardia and tachypnea and hypoxemia -She was started on HFNC but has been weaned down to 3.5 L/min oxygen -Procalcitonin was elevated at 18.7 -Started on ceftriaxone and Zithromax -Antibiotics were changed to IV Zosyn yesterday -We will continue with Zosyn and start IV Zithromax for Legionella pneumonia -Blood cultures negative to date -Strep pneumo urinary antigen is positive, Legionella pneumophila antigen is also positive  E. coli UTI -Urine culture is growing E. Coli -Started on IV Zosyn as above -Follow final culture results and change antibiotics based on culture and sensitivity results  Acute  hypoxemic respiratory failure -Secondary to pneumonia as above -Oxygen being weaned off -She also has grade 1 diastolic dysfunction; appears mildly volume overloaded -We will give 1 dose of Lasix 20 mg IV -Continue to wean off oxygen as tolerated  D-dimer elevated -CTA chest was negative for PE -Lower extremity venous duplex negative for DVT  Uncontrolled/accelerated hypertension -Blood pressure very high in 190s to 200s; requiring multiple doses of IV hydralazine -Started back on atenolol 25 mg daily -Blood pressure better controlled  Hypovolemic hyponatremia -Resolved  Mild transaminitis -Continue to hold statin -Hepatitis panel A, B, and C is negative  Hypothyroidism -Continue Synthroid  Demand ischemia -Troponin elevated 168, 137 -Likely from demand ischemia due to hypertension -No chest pain -Echocardiogram shows EF 55 to 60%, no wall motion abnormality, grade 1 diastolic dysfunction, small pericardial effusion -EKG showed nonspecific ST changes  Scheduled medications:    atenolol  25 mg Oral Daily   enoxaparin (LOVENOX) injection  40 mg Subcutaneous Q24H   guaiFENesin  600 mg Oral BID   ipratropium-albuterol  3 mL Nebulization TID   levothyroxine  25 mcg Oral Q0600   lidocaine  1 patch Transdermal Q24H   sodium chloride flush  3 mL Intravenous Q12H     Data Reviewed:   CBG:  Recent Labs  Lab 01/20/21 2358  GLUCAP 130*    SpO2: 96 % O2 Flow Rate (L/min): 3.5 L/min (found on 3.5 L, sats 95%.) FiO2 (%): 40 %    Vitals:   01/23/21 0800 01/23/21 1100 01/23/21 1126 01/23/21 1500  BP: (!) 158/82  (!) 168/92 (!) 146/75  Pulse: 85  77 70  Resp:  20  (!) 27 20  Temp:   97.7 F (36.5 C) 98.8 F (37.1 C)  TempSrc:   Oral Oral  SpO2: 96% 97% 96% 96%  Weight:      Height:         Intake/Output Summary (Last 24 hours) at 01/23/2021 1901 Last data filed at 01/23/2021 4540 Gross per 24 hour  Intake 239.58 ml  Output --  Net 239.58 ml    10/30 0701  - 10/31 1900 In: 239.6  Out: -   Filed Weights   01/20/21 1735  Weight: 63.3 kg    Data Reviewed: Basic Metabolic Panel: Recent Labs  Lab 01/20/21 0950 01/21/21 0451 01/22/21 0153 01/23/21 0422  NA 127* 132* 134* 137  K 3.2* 3.1* 3.9 4.2  CL 93* 98 102 103  CO2 21* 24 25 26   GLUCOSE 136* 113* 145* 123*  BUN 26* 23 27* 27*  CREATININE 0.90 0.73 0.69 0.71  CALCIUM 9.4 9.1 9.5 9.4   Liver Function Tests: Recent Labs  Lab 01/20/21 0950 01/21/21 0451  AST 65* 82*  ALT 49* 56*  ALKPHOS 108 88  BILITOT 1.9* 1.4*  PROT 5.7* 5.0*  ALBUMIN 2.9* 2.3*   No results for input(s): LIPASE, AMYLASE in the last 168 hours. No results for input(s): AMMONIA in the last 168 hours. CBC: Recent Labs  Lab 01/20/21 0950 01/21/21 0451 01/22/21 0153 01/23/21 0422  WBC 11.8* 11.2* 10.1 10.8*  NEUTROABS 11.1* 10.6*  --   --   HGB 14.6 13.3 13.3 13.4  HCT 40.8 39.4 38.7 39.9  MCV 85.9 88.1 87.4 89.3  PLT 212 213 231 222   Cardiac Enzymes: No results for input(s): CKTOTAL, CKMB, CKMBINDEX, TROPONINI in the last 168 hours. BNP (last 3 results) Recent Labs    01/22/21 0153  BNP 317.7*    ProBNP (last 3 results) No results for input(s): PROBNP in the last 8760 hours.  CBG: Recent Labs  Lab 01/20/21 2358  GLUCAP 130*       Radiology Reports  DG CHEST PORT 1 VIEW  Result Date: 01/21/2021 CLINICAL DATA:  Pneumonia. EXAM: PORTABLE CHEST 1 VIEW COMPARISON:  Radiograph and CT yesterday. FINDINGS: There is worsening left perihilar airspace disease. Small pleural effusions. Unchanged right basilar opacity and scattered atelectasis. The heart is normal in size. Aortic atherosclerosis. No pneumothorax or developing pulmonary edema. Right axillary surgical clips. IMPRESSION: 1. Worsening pneumonia in the left perihilar lung. 2. Unchanged small pleural effusions and right lung base opacity. Electronically Signed   By: Keith Rake M.D.   On: 01/21/2021 23:01        Antibiotics: Anti-infectives (From admission, onward)    Start     Dose/Rate Route Frequency Ordered Stop   01/22/21 1700  piperacillin-tazobactam (ZOSYN) IVPB 3.375 g        3.375 g 12.5 mL/hr over 240 Minutes Intravenous Every 8 hours 01/22/21 1529     01/21/21 1100  azithromycin (ZITHROMAX) 500 mg in sodium chloride 0.9 % 250 mL IVPB        500 mg 250 mL/hr over 60 Minutes Intravenous Every 24 hours 01/20/21 1334 01/22/21 1326   01/21/21 1100  cefTRIAXone (ROCEPHIN) 1 g in sodium chloride 0.9 % 100 mL IVPB  Status:  Discontinued        1 g 200 mL/hr over 30 Minutes Intravenous Every 24 hours 01/20/21 1334 01/22/21 1529   01/20/21 1130  cefTRIAXone (ROCEPHIN) 1 g in sodium chloride 0.9 % 100 mL IVPB  1 g 200 mL/hr over 30 Minutes Intravenous  Once 01/20/21 1120 01/20/21 1247   01/20/21 1130  azithromycin (ZITHROMAX) 500 mg in sodium chloride 0.9 % 250 mL IVPB        500 mg 250 mL/hr over 60 Minutes Intravenous  Once 01/20/21 1120 01/20/21 1458         DVT prophylaxis: Lovenox  Code Status: DNR  Family Communication: No family at bedside   Consultants:   Procedures:     Objective    Physical Examination:  General-appears in no acute distress Heart-S1-S2, regular, no murmur auscultated Lungs-crackles auscultated at lung bases Abdomen-soft, nontender, no organomegaly Extremities-no edema in the lower extremities Neuro-alert, oriented x3, no focal deficit noted  Status is: Inpatient  Dispo: The patient is from: Home              Anticipated d/c is to: Home              Anticipated d/c date is: 01/26/2021              Patient currently not stable for discharge  Barrier to discharge-significant for multifocal pneumonia  COVID-19 Labs  No results for input(s): DDIMER, FERRITIN, LDH, CRP in the last 72 hours.  Lab Results  Component Value Date   SARSCOV2NAA NEGATIVE 01/20/2021   [place] Not Detected 04/21/2020             Recent Results (from the past 240 hour(s))  Resp Panel by RT-PCR (Flu A&B, Covid) Nasopharyngeal Swab     Status: None   Collection Time: 01/20/21  9:49 AM   Specimen: Nasopharyngeal Swab; Nasopharyngeal(NP) swabs in vial transport medium  Result Value Ref Range Status   SARS Coronavirus 2 by RT PCR NEGATIVE NEGATIVE Final    Comment: (NOTE) SARS-CoV-2 target nucleic acids are NOT DETECTED.  The SARS-CoV-2 RNA is generally detectable in upper respiratory specimens during the acute phase of infection. The lowest concentration of SARS-CoV-2 viral copies this assay can detect is 138 copies/mL. A negative result does not preclude SARS-Cov-2 infection and should not be used as the sole basis for treatment or other patient management decisions. A negative result may occur with  improper specimen collection/handling, submission of specimen other than nasopharyngeal swab, presence of viral mutation(s) within the areas targeted by this assay, and inadequate number of viral copies(<138 copies/mL). A negative result must be combined with clinical observations, patient history, and epidemiological information. The expected result is Negative.  Fact Sheet for Patients:  EntrepreneurPulse.com.au  Fact Sheet for Healthcare Providers:  IncredibleEmployment.be  This test is no t yet approved or cleared by the [place] FDA and  has been authorized for detection and/or diagnosis of SARS-CoV-2 by FDA under an Emergency Use Authorization (EUA). This EUA will remain  in effect (meaning this test can be used) for the duration of the COVID-19 declaration under Section 564(b)(1) of the Act, 21 U.S.C.section 360bbb-3(b)(1), unless the authorization is terminated  or revoked sooner.       Influenza A by PCR NEGATIVE NEGATIVE Final   Influenza B by PCR NEGATIVE NEGATIVE Final    Comment: (NOTE) The Xpert Xpress SARS-CoV-2/FLU/RSV plus assay is intended  as an aid in the diagnosis of influenza from Nasopharyngeal swab specimens and should not be used as a sole basis for treatment. Nasal washings and aspirates are unacceptable for Xpert Xpress SARS-CoV-2/FLU/RSV testing.  Fact Sheet for Patients: EntrepreneurPulse.com.au  Fact Sheet for Healthcare Providers: IncredibleEmployment.be  This test is not yet approved  or cleared by the [place] and has been authorized for detection and/or diagnosis of SARS-CoV-2 by FDA under an Emergency Use Authorization (EUA). This EUA will remain in effect (meaning this test can be used) for the duration of the COVID-19 declaration under Section 564(b)(1) of the Act, 21 U.S.C. section 360bbb-3(b)(1), unless the authorization is terminated or revoked.  Performed at [facility], [place] [address] [place], [place] [address]   Culture, blood (Routine x 2)     Status: None (Preliminary result)   Collection Time: 01/20/21  9:50 AM   Specimen: BLOOD LEFT HAND  Result Value Ref Range Status   Specimen Description BLOOD LEFT HAND  Final   Special Requests   Final    BOTTLES DRAWN AEROBIC AND ANAEROBIC Blood Culture adequate volume   Culture   Final    NO GROWTH 3 DAYS Performed at [facility], [place] [address] [place], [place] [address]    Report Status PENDING  Incomplete  Culture, blood (Routine x 2)     Status: None (Preliminary result)   Collection Time: 01/20/21 11:47 AM   Specimen: BLOOD  Result Value Ref Range Status   Specimen Description BLOOD LEFT ANTECUBITAL  Final   Special Requests   Final    BOTTLES DRAWN AEROBIC ONLY Blood Culture adequate volume   Culture   Final    NO GROWTH 3 DAYS Performed at [facility], [address], [place], [place] [address]    Report Status PENDING  Incomplete  Urine Culture     Status: Abnormal   Collection Time: 01/20/21 12:09 PM   Specimen: Urine, Clean Catch  Result Value Ref Range Status    Specimen Description URINE, CLEAN CATCH  Final   Special Requests   Final    NONE Performed at [facility], [place] [address], [place], [place] [address]    Culture >=100,000 COLONIES/mL ESCHERICHIA COLI (A)  Final   Report Status 01/22/2021 FINAL  Final   Organism ID, Bacteria ESCHERICHIA COLI (A)  Final      Susceptibility   Escherichia coli - MIC*    AMPICILLIN <=2 SENSITIVE Sensitive     CEFAZOLIN <=4 SENSITIVE Sensitive     CEFEPIME <=0.12 SENSITIVE Sensitive     CEFTRIAXONE <=0.25 SENSITIVE Sensitive     CIPROFLOXACIN >=4 RESISTANT Resistant     GENTAMICIN <=1 SENSITIVE Sensitive     IMIPENEM <=0.25 SENSITIVE Sensitive     NITROFURANTOIN <=16 SENSITIVE Sensitive     TRIMETH/SULFA <=20 SENSITIVE Sensitive     AMPICILLIN/SULBACTAM <=2 SENSITIVE Sensitive     PIP/TAZO <=4 SENSITIVE Sensitive     * >=100,000 COLONIES/mL ESCHERICHIA COLI  Respiratory (~20 pathogens) panel by PCR     Status: None   Collection Time: 01/20/21  3:45 PM   Specimen: Nasopharyngeal Swab; Respiratory  Result Value Ref Range Status   Adenovirus NOT DETECTED NOT DETECTED Final   Coronavirus 229E NOT DETECTED NOT DETECTED Final    Comment: (NOTE) The Coronavirus on the Respiratory Panel, DOES NOT test for the novel  Coronavirus (2019 nCoV)    Coronavirus HKU1 NOT DETECTED NOT DETECTED Final   Coronavirus NL63 NOT DETECTED NOT DETECTED Final   Coronavirus OC43 NOT DETECTED NOT DETECTED Final   Metapneumovirus NOT DETECTED NOT DETECTED Final   Rhinovirus / Enterovirus NOT DETECTED NOT DETECTED Final   Influenza A NOT DETECTED NOT DETECTED Final   Influenza B NOT DETECTED NOT DETECTED Final  Parainfluenza Virus 1 NOT DETECTED NOT DETECTED Final   Parainfluenza Virus 2 NOT DETECTED NOT DETECTED Final   Parainfluenza Virus 3 NOT DETECTED NOT DETECTED Final   Parainfluenza Virus 4 NOT DETECTED NOT DETECTED Final   Respiratory Syncytial Virus NOT DETECTED NOT DETECTED Final   Bordetella pertussis  NOT DETECTED NOT DETECTED Final   Bordetella Parapertussis NOT DETECTED NOT DETECTED Final   Chlamydophila pneumoniae NOT DETECTED NOT DETECTED Final   Mycoplasma pneumoniae NOT DETECTED NOT DETECTED Final    Comment: Performed at [facility], [place] [address] [place], [place] [address]  MRSA Next Gen by PCR, Nasal     Status: None   Collection Time: 01/21/21 11:41 PM   Specimen: Nasal Mucosa; Nasal Swab  Result Value Ref Range Status   MRSA by PCR Next Gen NOT DETECTED NOT DETECTED Final    Comment: (NOTE) The GeneXpert MRSA Assay (FDA approved for NASAL specimens only), is one component of a comprehensive MRSA colonization surveillance program. It is not intended to diagnose MRSA infection nor to guide or monitor treatment for MRSA infections. Test performance is not FDA approved in patients less than 27 years old. Performed at [facility], [place] [address], [place],  [address]     [facility] If 7PM-7AM, please contact night-coverage at [email or web address], Office  [phone]   01/23/2021, 7:01 PM  LOS: 2 days

## 2021-01-23 NOTE — TOC Initial Note (Addendum)
Transition of Care [facility]) - Initial/Assessment Note    Patient Details  Name: Courtney Castro MRN: [id number] Date of Birth: 11-25-1933  Transition of [facility]) CM/SW Contact:    Joanne Chars, LCSW Phone Number: 01/23/2021, 2:28 PM  Clinical Narrative:     CSW met with pt and daughter, [name] [phone], to discuss discharge recommendation for [facility].  Permission given to speak with both daughters (only one listed in epic)  They are agreeable to [facility], choice document given, daughter wants to research the choices before selecting.  Current DME in home: cane.  PCP in place.    1540: TC from daughter [name].  HH preference for any of these agencies: [facility]              Expected Discharge Plan: [place] Barriers to Discharge: Continued Medical Work up   Patient Goals and CMS Choice Patient states their goals for this hospitalization and ongoing recovery are:: "care for myself again" CMS Medicare.gov Compare Post Acute Care list provided to:: Patient Represenative (must comment) Choice offered to / list presented to : Adult Children  Expected Discharge Plan and Services Expected Discharge Plan: [place] Choice: Home Health, Durable Medical Equipment Living arrangements for the past 2 months: Single Family Home                                      Prior Living Arrangements/Services Living arrangements for the past 2 months: Single Family Home Lives with:: Self Patient language and need for interpreter reviewed:: Yes Do you feel safe going back to the place where you live?: Yes      Need for Family Participation in Patient Care: Yes (Comment) Care giver support system in place?: Yes (comment) Current home services: Other (comment) (none) Criminal Activity/Legal Involvement Pertinent to Current Situation/Hospitalization: No - Comment as needed  Activities of Daily Living       Permission Sought/Granted Permission sought to share information with : Family Supports Permission granted to share information with : Yes, Verbal Permission Granted  Share Information with NAME: both daughters:[name], need name of second daughter  Permission granted to share info w AGENCY: HH  [name] is other daughter.        Emotional Assessment Appearance:: Appears stated age Attitude/Demeanor/Rapport: Engaged Affect (typically observed): Appropriate, Pleasant Orientation: :  (not recorded, pt appears oriented) Alcohol / Substance Use: Not Applicable Psych Involvement: No (comment)  Admission diagnosis:  Sepsis ([place]) [A41.9] Community acquired pneumonia, unspecified laterality [J18.9] Sepsis, due to unspecified organism, unspecified whether acute organ dysfunction present [facility]) [A41.9] Patient Active Problem List   Diagnosis Date Noted   Sepsis ([place]) 01/20/2021   History of breast cancer 05/22/2018   Genetic testing 12/20/2016   Family history of breast cancer    Subarachnoid hemorrhage ([place]) 02/26/2014   Shoulder pain 05/26/2012   Impaired fasting glucose 02/19/2012   Essential hypertension, benign 01/31/2011   Hypothyroidism 01/31/2011   Mixed hyperlipidemia 01/31/2011   Allergic rhinitis 01/31/2011   PCP:  Rita Ohara, MD Pharmacy:   [id number] - [address] [place] [address] Phone: [phone] Fax: [phone]     Social Determinants of Health (SDOH) Interventions    Readmission Risk Interventions No flowsheet data found.

## 2021-01-24 DIAGNOSIS — R778 Other specified abnormalities of plasma proteins: Secondary | ICD-10-CM | POA: Diagnosis not present

## 2021-01-24 DIAGNOSIS — R7989 Other specified abnormal findings of blood chemistry: Secondary | ICD-10-CM | POA: Diagnosis not present

## 2021-01-24 DIAGNOSIS — J189 Pneumonia, unspecified organism: Secondary | ICD-10-CM | POA: Diagnosis not present

## 2021-01-24 DIAGNOSIS — A419 Sepsis, unspecified organism: Secondary | ICD-10-CM | POA: Diagnosis not present

## 2021-01-24 LAB — CBC
HCT: 40.2 % (ref 36.0–46.0)
Hemoglobin: 13.6 g/dL (ref 12.0–15.0)
MCH: 30.2 pg (ref 26.0–34.0)
MCHC: 33.8 g/dL (ref 30.0–36.0)
MCV: 89.1 fL (ref 80.0–100.0)
Platelets: 201 10*3/uL (ref 150–400)
RBC: 4.51 MIL/uL (ref 3.87–5.11)
RDW: 13.6 % (ref 11.5–15.5)
WBC: 9.1 10*3/uL (ref 4.0–10.5)
nRBC: 0 % (ref 0.0–0.2)

## 2021-01-24 LAB — BASIC METABOLIC PANEL
Anion gap: 7 (ref 5–15)
BUN: 28 mg/dL — ABNORMAL HIGH (ref 8–23)
CO2: 31 mmol/L (ref 22–32)
Calcium: 9.2 mg/dL (ref 8.9–10.3)
Chloride: 98 mmol/L (ref 98–111)
Creatinine, Ser: 0.56 mg/dL (ref 0.44–1.00)
GFR, Estimated: >60 mL/min (ref >60)
Glucose, Bld: 102 mg/dL — ABNORMAL HIGH (ref 70–99)
Potassium: 3.3 mmol/L — ABNORMAL LOW (ref 3.5–5.1)
Sodium: 136 mmol/L (ref 135–145)

## 2021-01-24 MED ORDER — IPRATROPIUM-ALBUTEROL 0.5-2.5 (3) MG/3ML IN SOLN
3.0000 mL | Freq: Two times a day (BID) | RESPIRATORY_TRACT | Status: DC
  Administered 2021-01-24 – 2021-01-28 (×8): 3 mL via RESPIRATORY_TRACT
  Filled 2021-01-24 (×9): qty 3

## 2021-01-24 MED ORDER — FUROSEMIDE 10 MG/ML IJ SOLN
20.0000 mg | Freq: Once | INTRAMUSCULAR | Status: AC
  Administered 2021-01-24: 20 mg via INTRAVENOUS
  Filled 2021-01-24: qty 2

## 2021-01-24 MED ORDER — HYDRALAZINE HCL 20 MG/ML IJ SOLN
5.0000 mg | INTRAMUSCULAR | Status: DC | PRN
  Administered 2021-01-24 – 2021-01-25 (×2): 10 mg via INTRAVENOUS
  Filled 2021-01-24 (×2): qty 1

## 2021-01-24 MED ORDER — POTASSIUM CHLORIDE CRYS ER 20 MEQ PO TBCR
40.0000 meq | EXTENDED_RELEASE_TABLET | Freq: Once | ORAL | Status: AC
  Administered 2021-01-24: 40 meq via ORAL
  Filled 2021-01-24: qty 2

## 2021-01-24 MED ORDER — AZITHROMYCIN 250 MG PO TABS
500.0000 mg | ORAL_TABLET | Freq: Every day | ORAL | Status: AC
  Administered 2021-01-24 – 2021-01-26 (×3): 500 mg via ORAL
  Filled 2021-01-24 (×3): qty 2

## 2021-01-24 MED ORDER — SODIUM CHLORIDE 0.9 % IV SOLN
1.0000 g | INTRAVENOUS | Status: AC
  Administered 2021-01-24 – 2021-01-26 (×3): 1 g via INTRAVENOUS
  Filled 2021-01-24 (×3): qty 10

## 2021-01-24 NOTE — Progress Notes (Signed)
MD notified of Patient's elevated BP. Awaiting response and orders.

## 2021-01-24 NOTE — Care Management Important Message (Signed)
Important Message  Patient Details  Name: Courtney Castro MRN: [id number] Date of Birth: 05-25-1933   Medicare Important Message Given:  Yes     [name] 01/24/2021, 2:30 PM

## 2021-01-24 NOTE — Progress Notes (Signed)
[facility]  PROGRESS NOTE  Courtney EHLER NFA:[id number] DOB: 05-18-1933 DOA: 01/20/2021 PCP: Rita Ohara, MD   Brief HPI:   87 year old female with history of hypertension, hyperlipidemia, subdural and subarachnoid bleeding, history of breast cancer in remission came to ED with malaise, shortness of breath not feeling well since her trip from [place] past week.  On Thursday January 20, 2021.  She complains having a fall while in [place].  In the ED chest x-ray showed consolidation of the left lower lobe and bilateral upper lobes, multifocal infection.  CT head was negative and patient was started on antibiotics.  Since admission she has been running high blood pressure in 200s needing hydralazine doses 10 mg x 2 last was at 8 PM and around 9 PM blood pressure still high at 197 then at 10 PM systolic blood pressure was low at 104 and she became transiently unresponsive code stroke was activated and seen by neurology-CT head was done no acute finding, advised IV fluid hydration hypotension work-up and neurology available as needed   Subjective   Patient seen and examined, breathing is improved.  Diuresed well with IV Lasix given yesterday.   Assessment/Plan:    Multifocal pneumonia Severe sepsis POA -Patient presented with severe sepsis criteria including fever, tachycardia and tachypnea and hypoxemia -She was started on HFNC but has been weaned down to 3.5 L/min oxygen -Procalcitonin was elevated at 18.7 -Started on ceftriaxone and Zithromax -Antibiotics were changed to IV Zosyn yesterday -We will change IV Zosyn to ceftriaxone, also concurrent Zithromax for Legionella pneumonia -Blood cultures negative to date -Strep pneumo urinary antigen is positive, Legionella pneumophila antigen is also positive  E. coli UTI -Urine culture is growing E. Coli -Started on IV ceftriaxone as above  Acute hypoxemic respiratory failure -Secondary to pneumonia as above -Oxygen being weaned  off -She also has grade 1 diastolic dysfunction; appears mildly volume overloaded -Diuresed well with IV Lasix given yesterday -We will give additional dose of Lasix 20 mg IV x1 -Continue to wean off oxygen as tolerated  D-dimer elevation -CTA chest was negative for PE -Lower extremity venous duplex negative for DVT  Uncontrolled/accelerated hypertension -Blood pressure very high in 190s to 200s; requiring multiple doses of IV hydralazine -Started back on atenolol 25 mg daily -Blood pressure better controlled  Hypovolemic hyponatremia -Resolved  Mild transaminitis -Continue to hold statin -Hepatitis panel A, B, and C is negative  Hypothyroidism -Continue Synthroid  Demand ischemia -Troponin elevated 168, 137 -Likely from demand ischemia due to hypertension -No chest pain -Echocardiogram shows EF 55 to 60%, no wall motion abnormality, grade 1 diastolic dysfunction, small pericardial effusion -EKG showed nonspecific ST changes  Hypokalemia -Replace potassium and follow BMP in am  Scheduled medications:    atenolol  25 mg Oral Daily   azithromycin  500 mg Oral QHS   enoxaparin (LOVENOX) injection  40 mg Subcutaneous Q24H   guaiFENesin  600 mg Oral BID   ipratropium-albuterol  3 mL Nebulization BID   levothyroxine  25 mcg Oral Q0600   lidocaine  1 patch Transdermal Q24H   sodium chloride flush  3 mL Intravenous Q12H     Data Reviewed:   CBG:  Recent Labs  Lab 01/20/21 2358  GLUCAP 130*    SpO2: 94 % O2 Flow Rate (L/min): 3.5 L/min FiO2 (%): 40 %    Vitals:   01/24/21 0853 01/24/21 0900 01/24/21 1000 01/24/21 1300  BP: (!) 163/77 (!) 159/84  135/79  Pulse: 85 75  Resp: 20     Temp: 98 F (36.7 C)     TempSrc: Oral     SpO2: 96%  94%   Weight:      Height:         Intake/Output Summary (Last 24 hours) at 01/24/2021 1726 Last data filed at 01/24/2021 0300 Gross per 24 hour  Intake 503.51 ml  Output --  Net 503.51 ml    10/30 1901 - 11/01  0700 In: 743.1 [P.O.:120] Out: -   Filed Weights   01/20/21 1735  Weight: 63.3 kg    Data Reviewed: Basic Metabolic Panel: Recent Labs  Lab 01/20/21 0950 01/21/21 0451 01/22/21 0153 01/23/21 0422 01/24/21 0317  NA 127* 132* 134* 137 136  K 3.2* 3.1* 3.9 4.2 3.3*  CL 93* 98 102 103 98  CO2 21* 24 25 26 31   GLUCOSE 136* 113* 145* 123* 102*  BUN 26* 23 27* 27* 28*  CREATININE 0.90 0.73 0.69 0.71 0.56  CALCIUM 9.4 9.1 9.5 9.4 9.2   Liver Function Tests: Recent Labs  Lab 01/20/21 0950 01/21/21 0451  AST 65* 82*  ALT 49* 56*  ALKPHOS 108 88  BILITOT 1.9* 1.4*  PROT 5.7* 5.0*  ALBUMIN 2.9* 2.3*   No results for input(s): LIPASE, AMYLASE in the last 168 hours. No results for input(s): AMMONIA in the last 168 hours. CBC: Recent Labs  Lab 01/20/21 0950 01/21/21 0451 01/22/21 0153 01/23/21 0422 01/24/21 0317  WBC 11.8* 11.2* 10.1 10.8* 9.1  NEUTROABS 11.1* 10.6*  --   --   --   HGB 14.6 13.3 13.3 13.4 13.6  HCT 40.8 39.4 38.7 39.9 40.2  MCV 85.9 88.1 87.4 89.3 89.1  PLT 212 213 231 222 201   Cardiac Enzymes: No results for input(s): CKTOTAL, CKMB, CKMBINDEX, TROPONINI in the last 168 hours. BNP (last 3 results) Recent Labs    01/22/21 0153  BNP 317.7*    ProBNP (last 3 results) No results for input(s): PROBNP in the last 8760 hours.  CBG: Recent Labs  Lab 01/20/21 2358  GLUCAP 130*       Radiology Reports  No results found.     Antibiotics: Anti-infectives (From admission, onward)    Start     Dose/Rate Route Frequency Ordered Stop   01/24/21 2200  azithromycin (ZITHROMAX) tablet 500 mg        500 mg Oral Daily at bedtime 01/24/21 1257     01/23/21 2000  azithromycin (ZITHROMAX) 500 mg in sodium chloride 0.9 % 250 mL IVPB  Status:  Discontinued        500 mg 250 mL/hr over 60 Minutes Intravenous Every 24 hours 01/23/21 1910 01/24/21 1257   01/22/21 1700  piperacillin-tazobactam (ZOSYN) IVPB 3.375 g        3.375 g 12.5 mL/hr over 240  Minutes Intravenous Every 8 hours 01/22/21 1529     01/21/21 1100  azithromycin (ZITHROMAX) 500 mg in sodium chloride 0.9 % 250 mL IVPB        500 mg 250 mL/hr over 60 Minutes Intravenous Every 24 hours 01/20/21 1334 01/22/21 1326   01/21/21 1100  cefTRIAXone (ROCEPHIN) 1 g in sodium chloride 0.9 % 100 mL IVPB  Status:  Discontinued        1 g 200 mL/hr over 30 Minutes Intravenous Every 24 hours 01/20/21 1334 01/22/21 1529   01/20/21 1130  cefTRIAXone (ROCEPHIN) 1 g in sodium chloride 0.9 % 100 mL IVPB  1 g 200 mL/hr over 30 Minutes Intravenous  Once 01/20/21 1120 01/20/21 1247   01/20/21 1130  azithromycin (ZITHROMAX) 500 mg in sodium chloride 0.9 % 250 mL IVPB        500 mg 250 mL/hr over 60 Minutes Intravenous  Once 01/20/21 1120 01/20/21 1458         DVT prophylaxis: Lovenox  Code Status: DNR  Family Communication: No family at bedside   Consultants:   Procedures:     Objective    Physical Examination:  General-appears in no acute distress Heart-S1-S2, regular, no murmur auscultated Lungs-bilateral crackles at lung bases Abdomen-soft, nontender, no organomegaly Extremities-no edema in the lower extremities Neuro-alert, oriented x3, no focal deficit noted  Status is: Inpatient  Dispo: The patient is from: Home              Anticipated d/c is to: Home              Anticipated d/c date is: 01/26/2021              Patient currently not stable for discharge  Barrier to discharge-significant for multifocal pneumonia/UTI  COVID-19 Labs  No results for input(s): DDIMER, FERRITIN, LDH, CRP in the last 72 hours.  Lab Results  Component Value Date   SARSCOV2NAA NEGATIVE 01/20/2021   [place] Not Detected 04/21/2020            Recent Results (from the past 240 hour(s))  Resp Panel by RT-PCR (Flu A&B, Covid) Nasopharyngeal Swab     Status: None   Collection Time: 01/20/21  9:49 AM   Specimen: Nasopharyngeal Swab; Nasopharyngeal(NP) swabs in  vial transport medium  Result Value Ref Range Status   SARS Coronavirus 2 by RT PCR NEGATIVE NEGATIVE Final    Comment: (NOTE) SARS-CoV-2 target nucleic acids are NOT DETECTED.  The SARS-CoV-2 RNA is generally detectable in upper respiratory specimens during the acute phase of infection. The lowest concentration of SARS-CoV-2 viral copies this assay can detect is 138 copies/mL. A negative result does not preclude SARS-Cov-2 infection and should not be used as the sole basis for treatment or other patient management decisions. A negative result may occur with  improper specimen collection/handling, submission of specimen other than nasopharyngeal swab, presence of viral mutation(s) within the areas targeted by this assay, and inadequate number of viral copies(<138 copies/mL). A negative result must be combined with clinical observations, patient history, and epidemiological information. The expected result is Negative.  Fact Sheet for Patients:  EntrepreneurPulse.com.au  Fact Sheet for Healthcare Providers:  IncredibleEmployment.be  This test is no t yet approved or cleared by the [place] FDA and  has been authorized for detection and/or diagnosis of SARS-CoV-2 by FDA under an Emergency Use Authorization (EUA). This EUA will remain  in effect (meaning this test can be used) for the duration of the COVID-19 declaration under Section 564(b)(1) of the Act, 21 U.S.C.section 360bbb-3(b)(1), unless the authorization is terminated  or revoked sooner.       Influenza A by PCR NEGATIVE NEGATIVE Final   Influenza B by PCR NEGATIVE NEGATIVE Final    Comment: (NOTE) The Xpert Xpress SARS-CoV-2/FLU/RSV plus assay is intended as an aid in the diagnosis of influenza from Nasopharyngeal swab specimens and should not be used as a sole basis for treatment. Nasal washings and aspirates are unacceptable for Xpert Xpress SARS-CoV-2/FLU/RSV testing.  Fact  Sheet for Patients: EntrepreneurPulse.com.au  Fact Sheet for Healthcare Providers: IncredibleEmployment.be  This test is not yet approved  or cleared by the [place] and has been authorized for detection and/or diagnosis of SARS-CoV-2 by FDA under an Emergency Use Authorization (EUA). This EUA will remain in effect (meaning this test can be used) for the duration of the COVID-19 declaration under Section 564(b)(1) of the Act, 21 U.S.C. section 360bbb-3(b)(1), unless the authorization is terminated or revoked.  Performed at [facility], [place] [address], [place], [place] [address]   Culture, blood (Routine x 2)     Status: None (Preliminary result)   Collection Time: 01/20/21  9:50 AM   Specimen: BLOOD LEFT HAND  Result Value Ref Range Status   Specimen Description BLOOD LEFT HAND  Final   Special Requests   Final    BOTTLES DRAWN AEROBIC AND ANAEROBIC Blood Culture adequate volume   Culture   Final    NO GROWTH 4 DAYS Performed at [facility], [place] [address], [place], [place] [address]    Report Status PENDING  Incomplete  Culture, blood (Routine x 2)     Status: None (Preliminary result)   Collection Time: 01/20/21 11:47 AM   Specimen: BLOOD  Result Value Ref Range Status   Specimen Description BLOOD LEFT ANTECUBITAL  Final   Special Requests   Final    BOTTLES DRAWN AEROBIC ONLY Blood Culture adequate volume   Culture   Final    NO GROWTH 4 DAYS Performed at [facility], [place] [address] [place], [place] [address]    Report Status PENDING  Incomplete  Urine Culture     Status: Abnormal   Collection Time: 01/20/21 12:09 PM   Specimen: Urine, Clean Catch  Result Value Ref Range Status   Specimen Description URINE, CLEAN CATCH  Final   Special Requests   Final    NONE Performed at [facility], [place] [address], [place],  [address]    Culture >=100,000 COLONIES/mL ESCHERICHIA COLI (A)  Final    Report Status 01/22/2021 FINAL  Final   Organism ID, Bacteria ESCHERICHIA COLI (A)  Final      Susceptibility   Escherichia coli - MIC*    AMPICILLIN <=2 SENSITIVE Sensitive     CEFAZOLIN <=4 SENSITIVE Sensitive     CEFEPIME <=0.12 SENSITIVE Sensitive     CEFTRIAXONE <=0.25 SENSITIVE Sensitive     CIPROFLOXACIN >=4 RESISTANT Resistant     GENTAMICIN <=1 SENSITIVE Sensitive     IMIPENEM <=0.25 SENSITIVE Sensitive     NITROFURANTOIN <=16 SENSITIVE Sensitive     TRIMETH/SULFA <=20 SENSITIVE Sensitive     AMPICILLIN/SULBACTAM <=2 SENSITIVE Sensitive     PIP/TAZO <=4 SENSITIVE Sensitive     * >=100,000 COLONIES/mL ESCHERICHIA COLI  Respiratory (~20 pathogens) panel by PCR     Status: None   Collection Time: 01/20/21  3:45 PM   Specimen: Nasopharyngeal Swab; Respiratory  Result Value Ref Range Status   Adenovirus NOT DETECTED NOT DETECTED Final   Coronavirus 229E NOT DETECTED NOT DETECTED Final    Comment: (NOTE) The Coronavirus on the Respiratory Panel, DOES NOT test for the novel  Coronavirus (2019 nCoV)    Coronavirus HKU1 NOT DETECTED NOT DETECTED Final   Coronavirus NL63 NOT DETECTED NOT DETECTED Final   Coronavirus OC43 NOT DETECTED NOT DETECTED Final   Metapneumovirus NOT DETECTED NOT DETECTED Final   Rhinovirus / Enterovirus NOT DETECTED NOT DETECTED Final   Influenza A NOT DETECTED NOT DETECTED Final   Influenza B NOT DETECTED NOT DETECTED Final  Parainfluenza Virus 1 NOT DETECTED NOT DETECTED Final   Parainfluenza Virus 2 NOT DETECTED NOT DETECTED Final   Parainfluenza Virus 3 NOT DETECTED NOT DETECTED Final   Parainfluenza Virus 4 NOT DETECTED NOT DETECTED Final   Respiratory Syncytial Virus NOT DETECTED NOT DETECTED Final   Bordetella pertussis NOT DETECTED NOT DETECTED Final   Bordetella Parapertussis NOT DETECTED NOT DETECTED Final   Chlamydophila pneumoniae NOT DETECTED NOT DETECTED Final   Mycoplasma pneumoniae NOT DETECTED NOT DETECTED Final    Comment: Performed  at [facility], [place] [address], [place], [place] [address]  MRSA Next Gen by PCR, Nasal     Status: None   Collection Time: 01/21/21 11:41 PM   Specimen: Nasal Mucosa; Nasal Swab  Result Value Ref Range Status   MRSA by PCR Next Gen NOT DETECTED NOT DETECTED Final    Comment: (NOTE) The GeneXpert MRSA Assay (FDA approved for NASAL specimens only), is one component of a comprehensive MRSA colonization surveillance program. It is not intended to diagnose MRSA infection nor to guide or monitor treatment for MRSA infections. Test performance is not FDA approved in patients less than 55 years old. Performed at [facility], [place] [address] [place], [place] [address]     [facility] If 7PM-7AM, please contact night-coverage at [email or web address], Office  [phone]   01/24/2021, 5:26 PM  LOS: 3 days

## 2021-01-24 NOTE — Progress Notes (Signed)
Physical Therapy Treatment Patient Details Name: Courtney Castro MRN: [id number] DOB: [birth year] Today's Date: 01/24/2021   History of Present Illness Pt is an 87 y/o female admitted 10/28 with malaise, SOB and generally not feeling well since return from trip to [place].  Work up showing severe sepsis due to multifocal PNA seen on imaging with respiratory failure needing 5L HFNC.  Noted demand ischemia with elevated troponin  168 now trending down at 137.  PMHx:  HTN, [place] CA in remission, SDH/SAH, IBS, macular degeneration.    PT Comments    Pt up in chair upon PT arrival to room, states she has been sitting up for several hours. Pt tolerated 30 ft ambulation with RW well, very fatigued after walking this distance which pt attributes to be OOB all day. Pt requiring 3LO2 to maintain SpO2 88% and greater during gait. Pt's daughter present and supportive.  Will continue to follow.     Recommendations for follow up therapy are one component of a multi-disciplinary discharge planning process, led by the attending physician.  Recommendations may be updated based on patient status, additional functional criteria and insurance authorization.  Follow Up Recommendations  Home health PT     Assistance Recommended at Discharge Frequent or constant Supervision/Assistance  Equipment Recommendations  Other (comment)    Recommendations for Other Services       Precautions / Restrictions Precautions Precautions: Fall Restrictions Weight Bearing Restrictions: No     Mobility  Bed Mobility Overal bed mobility: Needs Assistance Bed Mobility: Sit to Supine       Sit to supine: Min assist;HOB elevated   General bed mobility comments: up in recliner upon PT arrival to room, light assist for LE lifting into bed when returning to supine.    Transfers Overall transfer level: Needs assistance Equipment used: Rolling walker (2 wheels) Transfers: Sit to/from [facility] Sit to  Stand: Min assist Stand pivot transfers: Min assist         General transfer comment: min assist for power up, steadying, and pivot to [facility]. STS x2, from EOB and BSC.    Ambulation/Gait Ambulation/Gait assistance: Min guard Gait Distance (Feet): 30 Feet Assistive device: Rolling walker (2 wheels) Gait Pattern/deviations: Step-through pattern;Trunk flexed;Decreased stride length Gait velocity: decr   General Gait Details: close gaurd for safety, verbal cuing for upright posture, closer proximity to RW. spo2 88-96% on 3LO2 when measuring with accurate pleth.   Stairs             Wheelchair Mobility    Modified Rankin (Stroke Patients Only)       Balance Overall balance assessment: Needs assistance Sitting-balance support: No upper extremity supported;Feet supported Sitting balance-Leahy Scale: Fair     Standing balance support: Bilateral upper extremity supported Standing balance-Leahy Scale: Poor                              Cognition Arousal/Alertness: Awake/alert Behavior During Therapy: WFL for tasks assessed/performed Overall Cognitive Status: Within Functional Limits for tasks assessed                                          Exercises      General Comments        Pertinent Vitals/Pain Pain Assessment: No/denies pain    Home Living  Prior Function            PT Goals (current goals can now be found in the care plan section) Acute Rehab PT Goals PT Goal Formulation: With patient Time For Goal Achievement: 02/04/21 Potential to Achieve Goals: Good Progress towards PT goals: Progressing toward goals    Frequency    Min 3X/week      PT Plan Current plan remains appropriate    Co-evaluation              AM-PAC PT "6 Clicks" Mobility   Outcome Measure  Help needed turning from your back to your side while in a flat bed without using bedrails?: A Little Help  needed moving from lying on your back to sitting on the side of a flat bed without using bedrails?: A Little Help needed moving to and from a bed to a chair (including a wheelchair)?: A Little Help needed standing up from a chair using your arms (e.g., wheelchair or bedside chair)?: A Little Help needed to walk in hospital room?: A Little Help needed climbing 3-5 steps with a railing? : A Lot 6 Click Score: 17    End of Session Equipment Utilized During Treatment: Oxygen Activity Tolerance: Patient tolerated treatment well;Patient limited by fatigue Patient left: with call bell/phone within reach;in bed;with bed alarm set;with family/visitor present Nurse Communication: Mobility status PT Visit Diagnosis: Other abnormalities of gait and mobility (R26.89);Unsteadiness on feet (R26.81);Difficulty in walking, not elsewhere classified (R26.2)     Time: 0076-2263 PT Time Calculation (min) (ACUTE ONLY): 31 min  Charges:  $Gait Training: 8-22 mins $Therapeutic Activity: 8-22 mins                    Stacie Glaze, PT DPT Acute Rehabilitation Services Pager [phone]  Office [phone]    [place] 01/24/2021, 5:27 PM

## 2021-01-25 DIAGNOSIS — R6520 Severe sepsis without septic shock: Secondary | ICD-10-CM | POA: Diagnosis not present

## 2021-01-25 DIAGNOSIS — A419 Sepsis, unspecified organism: Secondary | ICD-10-CM | POA: Diagnosis not present

## 2021-01-25 DIAGNOSIS — J9601 Acute respiratory failure with hypoxia: Secondary | ICD-10-CM | POA: Diagnosis not present

## 2021-01-25 LAB — BASIC METABOLIC PANEL
Anion gap: 7 (ref 5–15)
BUN: 30 mg/dL — ABNORMAL HIGH (ref 8–23)
CO2: 30 mmol/L (ref 22–32)
Calcium: 9.2 mg/dL (ref 8.9–10.3)
Chloride: 99 mmol/L (ref 98–111)
Creatinine, Ser: 0.52 mg/dL (ref 0.44–1.00)
GFR, Estimated: >60 mL/min (ref >60)
Glucose, Bld: 97 mg/dL (ref 70–99)
Potassium: 3.5 mmol/L (ref 3.5–5.1)
Sodium: 136 mmol/L (ref 135–145)

## 2021-01-25 LAB — CULTURE, BLOOD (ROUTINE X 2)
Culture: NO GROWTH
Culture: NO GROWTH
Special Requests: ADEQUATE
Special Requests: ADEQUATE

## 2021-01-25 LAB — LEGIONELLA PNEUMOPHILA SEROGP 1 UR AG: L. pneumophila Serogp 1 Ur Ag: POSITIVE — AB

## 2021-01-25 MED ORDER — FLUTICASONE PROPIONATE 50 MCG/ACT NA SUSP
2.0000 | Freq: Every day | NASAL | Status: DC
  Filled 2021-01-25: qty 16

## 2021-01-25 MED ORDER — SIMVASTATIN 20 MG PO TABS
20.0000 mg | ORAL_TABLET | Freq: Every day | ORAL | Status: DC
  Administered 2021-01-25: 20 mg via ORAL
  Filled 2021-01-25: qty 1

## 2021-01-25 MED ORDER — FLUTICASONE PROPIONATE 50 MCG/ACT NA SUSP: 2.0000 | Freq: Every day | NASAL | Status: DC | PRN

## 2021-01-25 MED ORDER — LORATADINE 10 MG PO TABS
10.0000 mg | ORAL_TABLET | Freq: Every day | ORAL | Status: DC
Start: 2021-01-25 — End: 2021-01-29
  Administered 2021-01-25 – 2021-01-29 (×5): 10 mg via ORAL
  Filled 2021-01-25 (×5): qty 1

## 2021-01-25 MED ORDER — LOSARTAN POTASSIUM 50 MG PO TABS
50.0000 mg | ORAL_TABLET | Freq: Every day | ORAL | Status: DC
  Administered 2021-01-25 – 2021-01-29 (×5): 50 mg via ORAL
  Filled 2021-01-25 (×5): qty 1

## 2021-01-25 NOTE — TOC Progression Note (Signed)
Transition of Care [facility]) - Progression Note    Patient Details  Name: FORREST DEMURO MRN: [id number] Date of Birth: 25-Sep-1933  Transition of Care [facility]) CM/SW Contact  Joanne Chars, LCSW Phone Number: 01/25/2021, 2:45 PM  Clinical Narrative:   Tommi Rumps at [facility] accepts for [facility].     Expected Discharge Plan: [place] Barriers to Discharge: Continued Medical Work up  Expected Discharge Plan and Services Expected Discharge Plan: [place], Durable Medical Equipment Living arrangements for the past 2 months: [place]: PT, OT [facility] Agency: [place] Date [place]: 01/25/21 Time [place]: [place] Representative spoke with at [place]: [place] ([place]) Interventions    Readmission Risk Interventions No flowsheet data found.

## 2021-01-25 NOTE — Plan of Care (Signed)

## 2021-01-25 NOTE — Progress Notes (Signed)
[facility] PROGRESS NOTE    Courtney Castro  NMM:[id number] DOB: 07-29-1933 DOA: 01/20/2021 PCP: Rita Ohara, MD   Brief Narrative/Hospital Course: Courtney Castro, 87 y.o. female with PMH of  Hypertension HLD, subdural and subarachnoid bleeding, history of breast cancer in remission presents to the ED with malaise, shortness of breath not feeling well ever since her trip from [place] past week, return on Thursday, October 20 and complains of having a fall while in [place] and also  been complaining of malaise generalized weakness in addition to dyspnea at rest and with exertion feeling warm but no known fever no chest pain.  In the ED found to be febrile along with hyponatremia hypokalemia leukocytosis, elevated LFTs chest x-ray left lower lobe and bilateral upper lobes multifocal infection CT head was negative, patient was admitted on IV antibiotics, and has been needing oxygen for hypoxic respiratory failure.  Her blood pressure has been running high initially in 200s needing hydralazine multiple doses following which transiently unresponsive code stroke was activated and seen by neurology-CT head was done no acute finding, advised IV fluid hydration.  Subjective: Seen and examined this morning daughter at the bedside. Blood pressure 110s to 160s, T-max 98.1 Patient reports she is feeling some better. Still on 2 L nasal cannula. Reports she was up in the chair and was able to ambulate yesterday  Assessment & Plan:  Severe Sepsis POA Multifocal pneumonia due to Legionella/Streptococcus: Acute hypoxic aspiratory failure due to pneumonia: Sepsis parameters improved, initially elevated procalcitonin 18.7 has improved , and patient is clinically improving.  Encourage PT OT.Urine strep pneumonia antigen 5 days ago positive and Legionella was positive. Due to respiratory issues antibiotic was switched to Zosyn and also on Zithromax for Legionella.  Antibiotics s back on rocephin 11/1 and on  zitrhomax.. blood culture no growth so far. Recent Labs  Lab 01/20/21 0939 01/20/21 0950 01/20/21 1139 01/20/21 1441 01/21/21 0451 01/22/21 0153 01/23/21 0422 01/23/21 2027 01/24/21 0317  WBC  --  11.8*  --   --  11.2* 10.1 10.8*  --  9.1  LATICACIDVEN 1.7  --  1.6  --   --  1.4  --   --   --   PROCALCITON  --   --   --  18.74  --   --   --  3.63  --    Acute hypoxic respiratory failure Acute respiratory distress with increased work of breathing: O2 being weaned down. Echo with G1 DD and  LVEF.patient has been given IV Lasix intermittently to help with weaning of oxygen.  continue incentive spirometry ambulation PT OT.  Wean oxygen as tolerated. Net IO Since Admission: 1,272.42 mL [01/25/21 0939]  Filed Weights   01/20/21 1735  Weight: 63.3 kg    E. coli UTI: Cont ceftriaxone.   Demand ischemia /elevated troponin 168>137:Elevated troponin likely due to demand ischemia in the setting of sepsis/hypoxia and accelerated hypertension.  Troponin downtrending.  Not complain of chest pain.  Echo showed EF 55 to 60%, no R WMA, G1 DD, small pericardial effusion.EKG- ST non sp st changes.   Elevated D-dimer 4.4 :CT angio chest and DVT leg neg, likely from pneumonia.  Uncontrolled/accelerated hypertension: On admission BP very high in 190s to 200s needing multiple doses of IV hydralazine> that cased transient hypotension-patient was symptomatic with unresponsiveness.  Continue atenolol, resume losartan at 50 mg.  Continue to hold HCTZ  Hypokalemia: resolved  Hypovolemic hyponatremia Hyponatremia due to Legionella: Resolved.  Encourage oral intake Recent  Labs  Lab 01/21/21 0451 01/22/21 0153 01/23/21 0422 01/24/21 0317 01/25/21 0356  NA 132* 134* 137 136 136    Mild transaminitis suspected to Legionella infection.  Acute viral panel negative Recent Labs  Lab 01/20/21 0950 01/21/21 0451  AST 65* 82*  ALT 49* 56*  ALKPHOS 108 88  BILITOT 1.9* 1.4*  PROT 5.7* 5.0*  ALBUMIN 2.9*  2.3*  INR 1.1  --    Dehydration with ketones in urine: Encourage oral intake, off fluids.    HLD resume statin.  Code stroke in the setting of acute hypotension from hydralazine: resolved. CT brain no acute finding.  Seen by neurology.  If recurrent episodes or somnolence can obtain MRI.  Minimize sudden drop in blood pressure, discontinued hydralazine IV.  She is nonfocal on exam.  Hypothyroidism continue her Synthroid  General weakness fall at home deconditioning debility: In the setting of sepsis.  Continue PT OT -PT assessed at home health PT  GOC: DNR on admission.  Monitor closely in the setting of sepsis and hypoxic respiratory failure.  DVT prophylaxis: enoxaparin (LOVENOX) injection 40 mg Start: 01/20/21 1600 Code Status:   Code Status: DNR Family Communication: plan of care discussed with patient at bedside. Status is: Inpatient  Remains inpatient for management of pneumonia sepsis   Objective: Vitals last 24 hrs: Vitals:   01/25/21 0437 01/25/21 0500 01/25/21 0600 01/25/21 0810  BP: (!) 155/76 (!) 167/78 116/60 (!) 155/74  Pulse: 88 76 85 93  Resp: (!) 21 20 18 20   Temp: 97.7 F (36.5 C)   97.7 F (36.5 C)  TempSrc: Oral   Oral  SpO2: 97% 98% 96% 96%  Weight:      Height:       Weight change:   Intake/Output Summary (Last 24 hours) at 01/25/2021 2202 Last data filed at 01/24/2021 2300 Gross per 24 hour  Intake 422.15 ml  Output --  Net 422.15 ml   Net IO Since Admission: 1,272.42 mL [01/25/21 0939]   Physical Examination: General exam: AAOx 3,older than stated age, weak appearing. HEENT:Oral mucosa moist, Ear/Nose WNL grossly, dentition normal. Respiratory system: bilaterally diminished at the base, no use of accessory muscle Cardiovascular system: S1 & S2 +, No JVD,. Gastrointestinal system: Abdomen soft,NT,ND, BS+ Nervous System:Alert, awake, moving extremities and grossly nonfocal Extremities: No edema, distal peripheral pulses palpable.  Skin: No  rashes,no icterus. MSK: Normal muscle bulk,tone, power   Medications reviewed:  Scheduled Meds:  atenolol  25 mg Oral Daily   azithromycin  500 mg Oral QHS   enoxaparin (LOVENOX) injection  40 mg Subcutaneous Q24H   fluticasone  2 spray Each Nare Daily   guaiFENesin  600 mg Oral BID   ipratropium-albuterol  3 mL Nebulization BID   levothyroxine  25 mcg Oral Q0600   lidocaine  1 patch Transdermal Q24H   loratadine  10 mg Oral Daily   losartan  50 mg Oral Daily   simvastatin  20 mg Oral QHS   sodium chloride flush  3 mL Intravenous Q12H   Continuous Infusions:  cefTRIAXone (ROCEPHIN)  IV Stopped (01/24/21 1818)   Diet Order             Diet regular Room service appropriate? Yes; Fluid consistency: Thin  Diet effective now                          Weight change:   Wt Readings from Last 3 Encounters:  01/20/21 63.3  kg  01/19/21 63.1 kg  01/16/21 63 kg     Consultants:see note  Procedures:see note Antimicrobials: Anti-infectives (From admission, onward)    Start     Dose/Rate Route Frequency Ordered Stop   01/24/21 2200  azithromycin (ZITHROMAX) tablet 500 mg        500 mg Oral Daily at bedtime 01/24/21 1257     01/24/21 1815  cefTRIAXone (ROCEPHIN) 1 g in sodium chloride 0.9 % 100 mL IVPB        1 g 200 mL/hr over 30 Minutes Intravenous Every 24 hours 01/24/21 1728     01/23/21 2000  azithromycin (ZITHROMAX) 500 mg in sodium chloride 0.9 % 250 mL IVPB  Status:  Discontinued        500 mg 250 mL/hr over 60 Minutes Intravenous Every 24 hours 01/23/21 1910 01/24/21 1257   01/22/21 1700  piperacillin-tazobactam (ZOSYN) IVPB 3.375 g  Status:  Discontinued        3.375 g 12.5 mL/hr over 240 Minutes Intravenous Every 8 hours 01/22/21 1529 01/24/21 1728   01/21/21 1100  azithromycin (ZITHROMAX) 500 mg in sodium chloride 0.9 % 250 mL IVPB        500 mg 250 mL/hr over 60 Minutes Intravenous Every 24 hours 01/20/21 1334 01/22/21 1326   01/21/21 1100  cefTRIAXone  (ROCEPHIN) 1 g in sodium chloride 0.9 % 100 mL IVPB  Status:  Discontinued        1 g 200 mL/hr over 30 Minutes Intravenous Every 24 hours 01/20/21 1334 01/22/21 1529   01/20/21 1130  cefTRIAXone (ROCEPHIN) 1 g in sodium chloride 0.9 % 100 mL IVPB        1 g 200 mL/hr over 30 Minutes Intravenous  Once 01/20/21 1120 01/20/21 1247   01/20/21 1130  azithromycin (ZITHROMAX) 500 mg in sodium chloride 0.9 % 250 mL IVPB        500 mg 250 mL/hr over 60 Minutes Intravenous  Once 01/20/21 1120 01/20/21 1458      Culture/Microbiology    Component Value Date/Time   SDES URINE, CLEAN CATCH 01/20/2021 1209   SPECREQUEST  01/20/2021 1209    NONE Performed at [facility], [place] [address] [place], [place] [address]    CULT >=100,000 COLONIES/mL ESCHERICHIA COLI (A) 01/20/2021 1209   REPTSTATUS 01/22/2021 FINAL 01/20/2021 1209    Other culture-see note  Unresulted Labs (From admission, onward)     Start     Ordered   01/21/21 0905  Expectorated Sputum Assessment w Gram Stain, Rflx to Resp Cult  Once,   R        01/21/21 0904           Data Reviewed: I have personally reviewed following labs and imaging studies CBC: Recent Labs  Lab 01/20/21 0950 01/21/21 0451 01/22/21 0153 01/23/21 0422 01/24/21 0317  WBC 11.8* 11.2* 10.1 10.8* 9.1  NEUTROABS 11.1* 10.6*  --   --   --   HGB 14.6 13.3 13.3 13.4 13.6  HCT 40.8 39.4 38.7 39.9 40.2  MCV 85.9 88.1 87.4 89.3 89.1  PLT 212 213 231 222 517   Basic Metabolic Panel: Recent Labs  Lab 01/21/21 0451 01/22/21 0153 01/23/21 0422 01/24/21 0317 01/25/21 0356  NA 132* 134* 137 136 136  K 3.1* 3.9 4.2 3.3* 3.5  CL 98 102 103 98 99  CO2 24 25 26 31 30   GLUCOSE 113* 145* 123* 102* 97  BUN 23 27* 27* 28* 30*  CREATININE 0.73  0.69 0.71 0.56 0.52  CALCIUM 9.1 9.5 9.4 9.2 9.2   GFR: Estimated Creatinine Clearance: 44.4 mL/min (by C-G formula based on SCr of 0.52 mg/dL). Liver Function Tests: Recent Labs  Lab 01/20/21 0950  01/21/21 0451  AST 65* 82*  ALT 49* 56*  ALKPHOS 108 88  BILITOT 1.9* 1.4*  PROT 5.7* 5.0*  ALBUMIN 2.9* 2.3*   No results for input(s): LIPASE, AMYLASE in the last 168 hours. No results for input(s): AMMONIA in the last 168 hours. Coagulation Profile: Recent Labs  Lab 01/20/21 0950  INR 1.1   Cardiac Enzymes: No results for input(s): CKTOTAL, CKMB, CKMBINDEX, TROPONINI in the last 168 hours. BNP (last 3 results) No results for input(s): PROBNP in the last 8760 hours. HbA1C: No results for input(s): HGBA1C in the last 72 hours.  CBG: Recent Labs  Lab 01/20/21 2358  GLUCAP 130*   Lipid Profile: No results for input(s): CHOL, HDL, LDLCALC, TRIG, CHOLHDL, LDLDIRECT in the last 72 hours. Thyroid Function Tests: No results for input(s): TSH, T4TOTAL, FREET4, T3FREE, THYROIDAB in the last 72 hours. Anemia Panel: No results for input(s): VITAMINB12, FOLATE, FERRITIN, TIBC, IRON, RETICCTPCT in the last 72 hours. Sepsis Labs: Recent Labs  Lab 01/20/21 1517 01/20/21 1139 01/20/21 1441 01/22/21 0153 01/23/21 2027  PROCALCITON  --   --  18.74  --  3.63  LATICACIDVEN 1.7 1.6  --  1.4  --     Recent Results (from the past 240 hour(s))  Resp Panel by RT-PCR (Flu A&B, Covid) Nasopharyngeal Swab     Status: None   Collection Time: 01/20/21  9:49 AM   Specimen: Nasopharyngeal Swab; Nasopharyngeal(NP) swabs in vial transport medium  Result Value Ref Range Status   SARS Coronavirus 2 by RT PCR NEGATIVE NEGATIVE Final    Comment: (NOTE) SARS-CoV-2 target nucleic acids are NOT DETECTED.  The SARS-CoV-2 RNA is generally detectable in upper respiratory specimens during the acute phase of infection. The lowest concentration of SARS-CoV-2 viral copies this assay can detect is 138 copies/mL. A negative result does not preclude SARS-Cov-2 infection and should not be used as the sole basis for treatment or other patient management decisions. A negative result may occur with   improper specimen collection/handling, submission of specimen other than nasopharyngeal swab, presence of viral mutation(s) within the areas targeted by this assay, and inadequate number of viral copies(<138 copies/mL). A negative result must be combined with clinical observations, patient history, and epidemiological information. The expected result is Negative.  Fact Sheet for Patients:  EntrepreneurPulse.com.au  Fact Sheet for Healthcare Providers:  IncredibleEmployment.be  This test is no t yet approved or cleared by the [place] FDA and  has been authorized for detection and/or diagnosis of SARS-CoV-2 by FDA under an Emergency Use Authorization (EUA). This EUA will remain  in effect (meaning this test can be used) for the duration of the COVID-19 declaration under Section 564(b)(1) of the Act, 21 U.S.C.section 360bbb-3(b)(1), unless the authorization is terminated  or revoked sooner.       Influenza A by PCR NEGATIVE NEGATIVE Final   Influenza B by PCR NEGATIVE NEGATIVE Final    Comment: (NOTE) The Xpert Xpress SARS-CoV-2/FLU/RSV plus assay is intended as an aid in the diagnosis of influenza from Nasopharyngeal swab specimens and should not be used as a sole basis for treatment. Nasal washings and aspirates are unacceptable for Xpert Xpress SARS-CoV-2/FLU/RSV testing.  Fact Sheet for Patients: EntrepreneurPulse.com.au  Fact Sheet for Healthcare Providers: IncredibleEmployment.be  This test  is not yet approved or cleared by the [place] and has been authorized for detection and/or diagnosis of SARS-CoV-2 by FDA under an Emergency Use Authorization (EUA). This EUA will remain in effect (meaning this test can be used) for the duration of the COVID-19 declaration under Section 564(b)(1) of the Act, 21 U.S.C. section 360bbb-3(b)(1), unless the authorization is terminated  or revoked.  Performed at [facility], [place] [address] [place], [place] [address]   Culture, blood (Routine x 2)     Status: None   Collection Time: 01/20/21  9:50 AM   Specimen: BLOOD LEFT HAND  Result Value Ref Range Status   Specimen Description BLOOD LEFT HAND  Final   Special Requests   Final    BOTTLES DRAWN AEROBIC AND ANAEROBIC Blood Culture adequate volume   Culture   Final    NO GROWTH 5 DAYS Performed at [facility], [place] [address], [place], [place] [address]    Report Status 01/25/2021 FINAL  Final  Culture, blood (Routine x 2)     Status: None   Collection Time: 01/20/21 11:47 AM   Specimen: BLOOD  Result Value Ref Range Status   Specimen Description BLOOD LEFT ANTECUBITAL  Final   Special Requests   Final    BOTTLES DRAWN AEROBIC ONLY Blood Culture adequate volume   Culture   Final    NO GROWTH 5 DAYS Performed at [facility], [place] [address] [place], [place] [address]    Report Status 01/25/2021 FINAL  Final  Urine Culture     Status: Abnormal   Collection Time: 01/20/21 12:09 PM   Specimen: Urine, Clean Catch  Result Value Ref Range Status   Specimen Description URINE, CLEAN CATCH  Final   Special Requests   Final    NONE Performed at [facility], [place] [address] [place], [place] [address]    Culture >=100,000 COLONIES/mL ESCHERICHIA COLI (A)  Final   Report Status 01/22/2021 FINAL  Final   Organism ID, Bacteria ESCHERICHIA COLI (A)  Final      Susceptibility   Escherichia coli - MIC*    AMPICILLIN <=2 SENSITIVE Sensitive     CEFAZOLIN <=4 SENSITIVE Sensitive     CEFEPIME <=0.12 SENSITIVE Sensitive     CEFTRIAXONE <=0.25 SENSITIVE Sensitive     CIPROFLOXACIN >=4 RESISTANT Resistant     GENTAMICIN <=1 SENSITIVE Sensitive     IMIPENEM <=0.25 SENSITIVE Sensitive     NITROFURANTOIN <=16 SENSITIVE Sensitive     TRIMETH/SULFA <=20 SENSITIVE Sensitive     AMPICILLIN/SULBACTAM <=2 SENSITIVE Sensitive     PIP/TAZO <=4 SENSITIVE  Sensitive     * >=100,000 COLONIES/mL ESCHERICHIA COLI  Respiratory (~20 pathogens) panel by PCR     Status: None   Collection Time: 01/20/21  3:45 PM   Specimen: Nasopharyngeal Swab; Respiratory  Result Value Ref Range Status   Adenovirus NOT DETECTED NOT DETECTED Final   Coronavirus 229E NOT DETECTED NOT DETECTED Final    Comment: (NOTE) The Coronavirus on the Respiratory Panel, DOES NOT test for the novel  Coronavirus (2019 nCoV)    Coronavirus HKU1 NOT DETECTED NOT DETECTED Final   Coronavirus NL63 NOT DETECTED NOT DETECTED Final   Coronavirus OC43 NOT DETECTED NOT DETECTED Final   Metapneumovirus NOT DETECTED NOT DETECTED Final   Rhinovirus / Enterovirus NOT DETECTED NOT DETECTED Final   Influenza A NOT DETECTED NOT DETECTED Final   Influenza B NOT DETECTED NOT DETECTED  Final   Parainfluenza Virus 1 NOT DETECTED NOT DETECTED Final   Parainfluenza Virus 2 NOT DETECTED NOT DETECTED Final   Parainfluenza Virus 3 NOT DETECTED NOT DETECTED Final   Parainfluenza Virus 4 NOT DETECTED NOT DETECTED Final   Respiratory Syncytial Virus NOT DETECTED NOT DETECTED Final   Bordetella pertussis NOT DETECTED NOT DETECTED Final   Bordetella Parapertussis NOT DETECTED NOT DETECTED Final   Chlamydophila pneumoniae NOT DETECTED NOT DETECTED Final   Mycoplasma pneumoniae NOT DETECTED NOT DETECTED Final    Comment: Performed at [facility], [place] [address] [place], [place] [address]  MRSA Next Gen by PCR, Nasal     Status: None   Collection Time: 01/21/21 11:41 PM   Specimen: Nasal Mucosa; Nasal Swab  Result Value Ref Range Status   MRSA by PCR Next Gen NOT DETECTED NOT DETECTED Final    Comment: (NOTE) The GeneXpert MRSA Assay (FDA approved for NASAL specimens only), is one component of a comprehensive MRSA colonization surveillance program. It is not intended to diagnose MRSA infection nor to guide or monitor treatment for MRSA infections. Test performance is not FDA approved in  patients less than 8 years old. Performed at [facility], [place] [address], [place], [place] [address]      Radiology Studies: No results found.   LOS: 4 days   Antonieta Pert, MD [facility]  01/25/2021, 9:39 AM

## 2021-01-26 ENCOUNTER — Inpatient Hospital Stay (HOSPITAL_COMMUNITY): Payer: Medicare Other

## 2021-01-26 ENCOUNTER — Ambulatory Visit: Payer: Medicare Other | Admitting: Family Medicine

## 2021-01-26 ENCOUNTER — Encounter: Payer: Self-pay | Admitting: Family Medicine

## 2021-01-26 DIAGNOSIS — J9601 Acute respiratory failure with hypoxia: Secondary | ICD-10-CM | POA: Diagnosis not present

## 2021-01-26 DIAGNOSIS — A419 Sepsis, unspecified organism: Secondary | ICD-10-CM | POA: Diagnosis not present

## 2021-01-26 DIAGNOSIS — R6520 Severe sepsis without septic shock: Secondary | ICD-10-CM | POA: Diagnosis not present

## 2021-01-26 LAB — COMPREHENSIVE METABOLIC PANEL
ALT: 131 U/L — ABNORMAL HIGH (ref 0–44)
AST: 147 U/L — ABNORMAL HIGH (ref 15–41)
Albumin: 2.4 g/dL — ABNORMAL LOW (ref 3.5–5.0)
Alkaline Phosphatase: 95 U/L (ref 38–126)
Anion gap: 4 — ABNORMAL LOW (ref 5–15)
BUN: 28 mg/dL — ABNORMAL HIGH (ref 8–23)
CO2: 34 mmol/L — ABNORMAL HIGH (ref 22–32)
Calcium: 9.3 mg/dL (ref 8.9–10.3)
Chloride: 100 mmol/L (ref 98–111)
Creatinine, Ser: 0.59 mg/dL (ref 0.44–1.00)
GFR, Estimated: >60 mL/min (ref >60)
Glucose, Bld: 104 mg/dL — ABNORMAL HIGH (ref 70–99)
Potassium: 3.7 mmol/L (ref 3.5–5.1)
Sodium: 138 mmol/L (ref 135–145)
Total Bilirubin: 0.7 mg/dL (ref 0.3–1.2)
Total Protein: 4.6 g/dL — ABNORMAL LOW (ref 6.5–8.1)

## 2021-01-26 LAB — CBC
HCT: 43 % (ref 36.0–46.0)
Hemoglobin: 14.4 g/dL (ref 12.0–15.0)
MCH: 29.9 pg (ref 26.0–34.0)
MCHC: 33.5 g/dL (ref 30.0–36.0)
MCV: 89.4 fL (ref 80.0–100.0)
Platelets: 249 10*3/uL (ref 150–400)
RBC: 4.81 MIL/uL (ref 3.87–5.11)
RDW: 13.4 % (ref 11.5–15.5)
WBC: 9.4 10*3/uL (ref 4.0–10.5)
nRBC: 0 % (ref 0.0–0.2)

## 2021-01-26 NOTE — Progress Notes (Signed)
Occupational Therapy Treatment Patient Details Name: Courtney Castro MRN: [id number] DOB: [birth year] Today's Date: 01/26/2021   History of present illness Pt is an 87 y/o female admitted 10/28 with malaise, SOB and generally not feeling well since return from trip to [place].  Work up showing severe sepsis due to multifocal PNA seen on imaging with respiratory failure needing 5L HFNC.  Noted demand ischemia with elevated troponin  168 now trending down at 137.  PMHx:  HTN, [place] CA in remission, SDH/SAH, IBS, macular degeneration.   OT comments  Patient received in recliner and asking to return to bed. Patient was willing to work with OT before going back to bed. Patient tolerated standing at sink for grooming and using RW for transfer back to bed. Patient demonstrated increased bed mobility with patient being able to center self in bed. Acute OT to continue to follow.    Recommendations for follow up therapy are one component of a multi-disciplinary discharge planning process, led by the attending physician.  Recommendations may be updated based on patient status, additional functional criteria and insurance authorization.    Follow Up Recommendations  Home health OT    Assistance Recommended at Discharge Intermittent Supervision/Assistance  Equipment Recommendations  Other (comment)    Recommendations for Other Services      Precautions / Restrictions Precautions Precautions: Fall       Mobility Bed Mobility Overal bed mobility: Needs Assistance Bed Mobility: Sit to Supine       Sit to supine: Min assist;HOB elevated   General bed mobility comments: Patietn was able to center self in bed    Transfers Overall transfer level: Needs assistance Equipment used: Rolling walker (2 wheels) Transfers: Sit to/from [facility] Sit to Stand: Min guard Stand pivot transfers: Min assist         General transfer comment: transfer from recliner to eob with verbal  cues for safety     Balance Overall balance assessment: Needs assistance Sitting-balance support: No upper extremity supported;Feet supported Sitting balance-Leahy Scale: Fair Sitting balance - Comments: able to sit on eob unassisted   Standing balance support: Bilateral upper extremity supported Standing balance-Leahy Scale: Poor Standing balance comment: able to stand at sink for grooming with one extremity suppport                           ADL either performed or assessed with clinical judgement   ADL Overall ADL's : Needs assistance/impaired     Grooming: Wash/dry hands;Wash/dry face;Oral care;Min guard;Standing Grooming Details (indicate cue type and reason): performed standing at sink                             Functional mobility during ADLs: Minimal assistance;Rolling walker (2 wheels) General ADL Comments: patient able to stand at sink for groomin     Vision       Perception     Praxis      Cognition Arousal/Alertness: Awake/alert Behavior During Therapy: WFL for tasks assessed/performed Overall Cognitive Status: Within Functional Limits for tasks assessed                                 General Comments: fatigue from sitting up in chair          Exercises     Shoulder Instructions  General Comments VSS  raised to 2L during gait.  Pt on 1L now at rest.    Pertinent Vitals/ Pain       Pain Assessment: No/denies pain  Home Living                                          Prior Functioning/Environment              Frequency  Min 2X/week        Progress Toward Goals  OT Goals(current goals can now be found in the care plan section)  Progress towards OT goals: Progressing toward goals  Acute Rehab OT Goals Patient Stated Goal: to get better OT Goal Formulation: With patient Time For Goal Achievement: 02/04/21 Potential to Achieve Goals: Good ADL Goals Pt Will Perform  Lower Body Bathing: with modified independence;sitting/lateral leans;sit to/from stand Pt Will Perform Lower Body Dressing: with modified independence;sitting/lateral leans;sit to/from stand Pt Will Transfer to Toilet: with modified independence;ambulating Pt Will Perform Toileting - Clothing Manipulation and hygiene: with modified independence;sitting/lateral leans;sit to/from stand Additional ADL Goal #1: Pt will recall 3 energy conservation techniques that she can use at home.  Plan Discharge plan remains appropriate    Co-evaluation                 AM-PAC OT "6 Clicks" Daily Activity     Outcome Measure   Help from another person eating meals?: None Help from another person taking care of personal grooming?: A Little Help from another person toileting, which includes using toliet, bedpan, or urinal?: A Little Help from another person bathing (including washing, rinsing, drying)?: A Little Help from another person to put on and taking off regular upper body clothing?: A Little Help from another person to put on and taking off regular lower body clothing?: A Little 6 Click Score: 19    End of Session Equipment Utilized During Treatment: Gait belt;Rolling walker (2 wheels);Oxygen  OT Visit Diagnosis: Unsteadiness on feet (R26.81);Other abnormalities of gait and mobility (R26.89);Muscle weakness (generalized) (M62.81)   Activity Tolerance Patient limited by fatigue   Patient Left in bed;with call bell/phone within reach;with bed alarm set   Nurse Communication Mobility status        Time: 1696-7893 OT Time Calculation (min): 26 min  Charges: OT General Charges $OT Visit: 1 Visit OT Treatments $Self Care/Home Management : 23-37 mins  Lodema Hong, [place]  Pager [phone] Office [place] 01/26/2021, 3:02 PM

## 2021-01-26 NOTE — Plan of Care (Signed)

## 2021-01-26 NOTE — Progress Notes (Signed)
[facility] PROGRESS NOTE   Courtney Castro  MCN:[id number] DOB: June 03, 1933 DOA: 01/20/2021 PCP: Rita Ohara, MD   Brief Narrative/Hospital Course: Courtney Castro, 87 y.o. female with PMH of  Hypertension HLD, subdural and subarachnoid bleeding, history of breast cancer in remission presents to the ED with malaise, shortness of breath not feeling well ever since her trip from [place] past week, return on Thursday, October 20 and complains of having a fall while in [place] and also  been complaining of malaise generalized weakness in addition to dyspnea at rest and with exertion feeling warm but no known fever no chest pain.  In the ED,found to be febrile along with hyponatremia hypokalemia leukocytosis, elevated LFTs chest x-ray left lower lobe and bilateral upper lobes multifocal infection CT head was negative, patient was admitted on IV antibiotics, and has been needing oxygen for hypoxic respiratory failure.  Her blood pressure has been running high initially in 200s needing hydralazine multiple doses following which transiently unresponsive code stroke was activated and seen by neurology-CT head was done no acute finding, advised IV fluid hydration. Patient was treated for severe sepsis with multifocal pneumonia due to Legionella and Streptococcus.  She also needed oxygen for hypoxic respiratory failure. Patient is slowly improving, but continues to need oxygen.  Subjective: Afebrile overnight Still needing 1 to 2 L nasal cannula, overall shortness of breath improving able to ambulate well.  Assessment & Plan:  Severe Sepsis POA Multifocal pneumonia due to Legionella/Streptococcus: Acute hypoxic aspiratory failure due to pneumonia: Sepsis parameters improved, initially elevated procalcitonin 18.7 has improved to 13.6.  Clinically improving, continues to need oxygen and working on weaning it off.  Completing 7 days of antibiotics ceftriaxone or zosyn with -azithromycin.Urine strep  pneumonia antigen -positive and Legionella was positive.Blood culture no growth so far.  Weaning off oxygen still needing [place]. Recent Labs  Lab 01/20/21 0939 01/20/21 0950 01/20/21 1139 01/20/21 1441 01/21/21 0451 01/22/21 0153 01/23/21 0422 01/23/21 2027 01/24/21 0317 01/26/21 0312  WBC  --    < >  --   --  11.2* 10.1 10.8*  --  9.1 9.4  LATICACIDVEN 1.7  --  1.6  --   --  1.4  --   --   --   --   PROCALCITON  --   --   --  18.74  --   --   --  3.63  --   --    < > = values in this interval not displayed.   Acute hypoxic respiratory failure Acute respiratory distress with increased work of breathing: Hypoxia due to pneumonia.  Wean oxygen as tolerated to maintain saturation at least 92%.Echo with G1 DD and  LVEF. Patient has been given IV Lasix intermittently. AskED RN to check her weight  today if has significant gain- we will give Lasix again today.Continue  IS, ambulation w/ PT OT.   Net IO Since Admission: 772.42 mL [01/26/21 1315]  Filed Weights   01/20/21 1735  Weight: 63.3 kg    E. coli UTI: Completed antibiotics  Demand ischemia /elevated troponin 168>137:Elevated troponin likely due to demand ischemia in the setting of sepsis/hypoxia and accelerated hypertension.  Troponin downtrending.  No complain of chest pain.  Echo showed EF 55 to 60%, no R WMA, G1 DD, small pericardial effusion.EKG- ST non sp st changes.   Elevated D-dimer 4.4 :CT angio chest and DVT leg neg, likely from pneumonia.  Uncontrolled/accelerated hypertension: On admission BP very high in 190s to 200s  needing multiple doses of IV hydralazine> that cased transient hypotension-patient was symptomatic with unresponsiveness.  Continue p.o. atenolol, reduced dose of losartan at 50 mg.  Continue to hold HCTZ.  BP level fluctuates about hypOtension  Hypokalemia: resolved Hypovolemic hyponatremia Hyponatremia due to Legionella: RESOLVED Recent Labs  Lab 01/22/21 0153 01/23/21 0422 01/24/21 0317  01/25/21 0356 01/26/21 0312  NA 134* 137 136 136 138    Mild transaminitis suspected to Legionella infection.  Acute viral panel negative.  Noted LFTs worsening, obtaining RUQ ultrasound , hold statin. Recent Labs  Lab 01/20/21 0950 01/21/21 0451 01/26/21 0312  AST 65* 82* 147*  ALT 49* 56* 131*  ALKPHOS 108 88 95  BILITOT 1.9* 1.4* 0.7  PROT 5.7* 5.0* 4.6*  ALBUMIN 2.9* 2.3* 2.4*  INR 1.1  --   --    Dehydration with ketones in urine: Encourage oral intake, off fluids.    HLD HOLD statin 2/2 transaminitis.    Code stroke in the setting of acute hypotension from hydralazine: resolved. CT brain no acute finding.  Seen by neurology.  If recurrent episodes or somnolence can obtain MRI.  Minimize sudden drop in blood pressure, discontinued hydralazine IV.  She is nonfocal on exam.  Hypothyroidism:Continue home Synthroid  General weakness fall at home deconditioning debility: In the setting of sepsis.  Continue PT OT -PT planning for HHPT  GOC:DNR on admission.Monitor closely in the setting of sepsis and hypoxic respiratory failure.  DVT prophylaxis: enoxaparin (LOVENOX) injection 40 mg Start: 01/20/21 1600 Code Status:   Code Status: DNR Family Communication: plan of care discussed with patient at bedside. Status is: Inpatient  Remains inpatient for management of pneumonia sepsis  Currently not stable for discharge Anticipate discharge tomorrow if able to come off oxygen and LFTs stable  Objective: Vitals last 24 hrs: Vitals:   01/25/21 2328 01/26/21 0000 01/26/21 0343 01/26/21 0858  BP: 109/65 118/61 (!) 157/71 (!) 159/92  Pulse: 67 65 70 76  Resp: 19 17 20    Temp: 98.6 F (37 C)  97.8 F (36.6 C)   TempSrc: Oral  Axillary   SpO2: 94% 94% 96%   Weight:      Height:       Weight change:   Intake/Output Summary (Last 24 hours) at 01/26/2021 1315 Last data filed at 01/26/2021 0615 Gross per 24 hour  Intake --  Output 500 ml  Net -500 ml   Net IO Since Admission:  772.42 mL [01/26/21 1315]   Physical Examination: General exam: AAOx 3, on bedside chair, on nasal cannula older than stated age, weak appearing. HEENT:Oral mucosa moist, Ear/Nose WNL grossly, dentition normal. Respiratory system: bilaterally basal crackles present, no use of accessory muscle Cardiovascular system: S1 & S2 +, No JVD,. Gastrointestinal system: Abdomen soft, NT,ND, BS+ Nervous System:Alert, awake, moving extremities and grossly nonfocal Extremities: no edema, distal peripheral pulses palpable.  Skin: No rashes,no icterus. MSK: Normal muscle bulk,tone, power   Medications reviewed:  Scheduled Meds:  atenolol  25 mg Oral Daily   azithromycin  500 mg Oral QHS   enoxaparin (LOVENOX) injection  40 mg Subcutaneous Q24H   guaiFENesin  600 mg Oral BID   ipratropium-albuterol  3 mL Nebulization BID   levothyroxine  25 mcg Oral Q0600   lidocaine  1 patch Transdermal Q24H   loratadine  10 mg Oral Daily   losartan  50 mg Oral Daily   sodium chloride flush  3 mL Intravenous Q12H   Continuous Infusions:  cefTRIAXone (ROCEPHIN)  IV 1 g (01/25/21 1628)   Diet Order             Diet regular Room service appropriate? Yes; Fluid consistency: Thin  Diet effective now                          Weight change:   Wt Readings from Last 3 Encounters:  01/20/21 63.3 kg  01/19/21 63.1 kg  01/16/21 63 kg     Consultants:see note  Procedures:see note Antimicrobials: Anti-infectives (From admission, onward)    Start     Dose/Rate Route Frequency Ordered Stop   01/24/21 2200  azithromycin (ZITHROMAX) tablet 500 mg        500 mg Oral Daily at bedtime 01/24/21 1257 01/26/21 2359   01/24/21 1815  cefTRIAXone (ROCEPHIN) 1 g in sodium chloride 0.9 % 100 mL IVPB        1 g 200 mL/hr over 30 Minutes Intravenous Every 24 hours 01/24/21 1728 01/26/21 2359   01/23/21 2000  azithromycin (ZITHROMAX) 500 mg in sodium chloride 0.9 % 250 mL IVPB  Status:  Discontinued        500 mg 250  mL/hr over 60 Minutes Intravenous Every 24 hours 01/23/21 1910 01/24/21 1257   01/22/21 1700  piperacillin-tazobactam (ZOSYN) IVPB 3.375 g  Status:  Discontinued        3.375 g 12.5 mL/hr over 240 Minutes Intravenous Every 8 hours 01/22/21 1529 01/24/21 1728   01/21/21 1100  azithromycin (ZITHROMAX) 500 mg in sodium chloride 0.9 % 250 mL IVPB        500 mg 250 mL/hr over 60 Minutes Intravenous Every 24 hours 01/20/21 1334 01/22/21 1326   01/21/21 1100  cefTRIAXone (ROCEPHIN) 1 g in sodium chloride 0.9 % 100 mL IVPB  Status:  Discontinued        1 g 200 mL/hr over 30 Minutes Intravenous Every 24 hours 01/20/21 1334 01/22/21 1529   01/20/21 1130  cefTRIAXone (ROCEPHIN) 1 g in sodium chloride 0.9 % 100 mL IVPB        1 g 200 mL/hr over 30 Minutes Intravenous  Once 01/20/21 1120 01/20/21 1247   01/20/21 1130  azithromycin (ZITHROMAX) 500 mg in sodium chloride 0.9 % 250 mL IVPB        500 mg 250 mL/hr over 60 Minutes Intravenous  Once 01/20/21 1120 01/20/21 1458      Culture/Microbiology    Component Value Date/Time   SDES URINE, CLEAN CATCH 01/20/2021 1209   SPECREQUEST  01/20/2021 1209    NONE Performed at [facility], [place] [address] [place], [place] [address]    CULT >=100,000 COLONIES/mL ESCHERICHIA COLI (A) 01/20/2021 1209   REPTSTATUS 01/22/2021 FINAL 01/20/2021 1209    Other culture-see note  Unresulted Labs (From admission, onward)     Start     Ordered   01/27/21 1660  Basic metabolic panel  Daily,   R     Question:  Specimen collection method  Answer:  Lab=Lab collect   01/25/21 1326   01/26/21 0500  CBC  Daily,   R     Question:  Specimen collection method  Answer:  Lab=Lab collect   01/25/21 1326   01/21/21 0905  Expectorated Sputum Assessment w Gram Stain, Rflx to Resp Cult  Once,   R        01/21/21 6301           Data Reviewed: I have personally reviewed  following labs and imaging studies CBC: Recent Labs  Lab 01/20/21 0950 01/21/21 0451  01/22/21 0153 01/23/21 0422 01/24/21 0317 01/26/21 0312  WBC 11.8* 11.2* 10.1 10.8* 9.1 9.4  NEUTROABS 11.1* 10.6*  --   --   --   --   HGB 14.6 13.3 13.3 13.4 13.6 14.4  HCT 40.8 39.4 38.7 39.9 40.2 43.0  MCV 85.9 88.1 87.4 89.3 89.1 89.4  PLT 212 213 231 222 201 938   Basic Metabolic Panel: Recent Labs  Lab 01/22/21 0153 01/23/21 0422 01/24/21 0317 01/25/21 0356 01/26/21 0312  NA 134* 137 136 136 138  K 3.9 4.2 3.3* 3.5 3.7  CL 102 103 98 99 100  CO2 25 26 31 30  34*  GLUCOSE 145* 123* 102* 97 104*  BUN 27* 27* 28* 30* 28*  CREATININE 0.69 0.71 0.56 0.52 0.59  CALCIUM 9.5 9.4 9.2 9.2 9.3   GFR: Estimated Creatinine Clearance: 44.4 mL/min (by C-G formula based on SCr of 0.59 mg/dL). Liver Function Tests: Recent Labs  Lab 01/20/21 0950 01/21/21 0451 01/26/21 0312  AST 65* 82* 147*  ALT 49* 56* 131*  ALKPHOS 108 88 95  BILITOT 1.9* 1.4* 0.7  PROT 5.7* 5.0* 4.6*  ALBUMIN 2.9* 2.3* 2.4*   No results for input(s): LIPASE, AMYLASE in the last 168 hours. No results for input(s): AMMONIA in the last 168 hours. Coagulation Profile: Recent Labs  Lab 01/20/21 0950  INR 1.1   Cardiac Enzymes: No results for input(s): CKTOTAL, CKMB, CKMBINDEX, TROPONINI in the last 168 hours. BNP (last 3 results) No results for input(s): PROBNP in the last 8760 hours. HbA1C: No results for input(s): HGBA1C in the last 72 hours.  CBG: Recent Labs  Lab 01/20/21 2358  GLUCAP 130*   Lipid Profile: No results for input(s): CHOL, HDL, LDLCALC, TRIG, CHOLHDL, LDLDIRECT in the last 72 hours. Thyroid Function Tests: No results for input(s): TSH, T4TOTAL, FREET4, T3FREE, THYROIDAB in the last 72 hours. Anemia Panel: No results for input(s): VITAMINB12, FOLATE, FERRITIN, TIBC, IRON, RETICCTPCT in the last 72 hours. Sepsis Labs: Recent Labs  Lab 01/20/21 1017 01/20/21 1139 01/20/21 1441 01/22/21 0153 01/23/21 2027  PROCALCITON  --   --  18.74  --  3.63  LATICACIDVEN 1.7 1.6  --   1.4  --     Recent Results (from the past 240 hour(s))  Resp Panel by RT-PCR (Flu A&B, Covid) Nasopharyngeal Swab     Status: None   Collection Time: 01/20/21  9:49 AM   Specimen: Nasopharyngeal Swab; Nasopharyngeal(NP) swabs in vial transport medium  Result Value Ref Range Status   SARS Coronavirus 2 by RT PCR NEGATIVE NEGATIVE Final    Comment: (NOTE) SARS-CoV-2 target nucleic acids are NOT DETECTED.  The SARS-CoV-2 RNA is generally detectable in upper respiratory specimens during the acute phase of infection. The lowest concentration of SARS-CoV-2 viral copies this assay can detect is 138 copies/mL. A negative result does not preclude SARS-Cov-2 infection and should not be used as the sole basis for treatment or other patient management decisions. A negative result may occur with  improper specimen collection/handling, submission of specimen other than nasopharyngeal swab, presence of viral mutation(s) within the areas targeted by this assay, and inadequate number of viral copies(<138 copies/mL). A negative result must be combined with clinical observations, patient history, and epidemiological information. The expected result is Negative.  Fact Sheet for Patients:  EntrepreneurPulse.com.au  Fact Sheet for Healthcare Providers:  IncredibleEmployment.be  This test is no t yet  approved or cleared by the [place] and  has been authorized for detection and/or diagnosis of SARS-CoV-2 by FDA under an Emergency Use Authorization (EUA). This EUA will remain  in effect (meaning this test can be used) for the duration of the COVID-19 declaration under Section 564(b)(1) of the Act, 21 U.S.C.section 360bbb-3(b)(1), unless the authorization is terminated  or revoked sooner.       Influenza A by PCR NEGATIVE NEGATIVE Final   Influenza B by PCR NEGATIVE NEGATIVE Final    Comment: (NOTE) The Xpert Xpress SARS-CoV-2/FLU/RSV plus assay is  intended as an aid in the diagnosis of influenza from Nasopharyngeal swab specimens and should not be used as a sole basis for treatment. Nasal washings and aspirates are unacceptable for Xpert Xpress SARS-CoV-2/FLU/RSV testing.  Fact Sheet for Patients: EntrepreneurPulse.com.au  Fact Sheet for Healthcare Providers: IncredibleEmployment.be  This test is not yet approved or cleared by the [place] FDA and has been authorized for detection and/or diagnosis of SARS-CoV-2 by FDA under an Emergency Use Authorization (EUA). This EUA will remain in effect (meaning this test can be used) for the duration of the COVID-19 declaration under Section 564(b)(1) of the Act, 21 U.S.C. section 360bbb-3(b)(1), unless the authorization is terminated or revoked.  Performed at [facility], [place] [address], [place], [place] [address]   Culture, blood (Routine x 2)     Status: None   Collection Time: 01/20/21  9:50 AM   Specimen: BLOOD LEFT HAND  Result Value Ref Range Status   Specimen Description BLOOD LEFT HAND  Final   Special Requests   Final    BOTTLES DRAWN AEROBIC AND ANAEROBIC Blood Culture adequate volume   Culture   Final    NO GROWTH 5 DAYS Performed at [facility], [place] [address], [place], [place] [address]    Report Status 01/25/2021 FINAL  Final  Culture, blood (Routine x 2)     Status: None   Collection Time: 01/20/21 11:47 AM   Specimen: BLOOD  Result Value Ref Range Status   Specimen Description BLOOD LEFT ANTECUBITAL  Final   Special Requests   Final    BOTTLES DRAWN AEROBIC ONLY Blood Culture adequate volume   Culture   Final    NO GROWTH 5 DAYS Performed at [facility], [place] [address], [place], [place] [address]    Report Status 01/25/2021 FINAL  Final  Urine Culture     Status: Abnormal   Collection Time: 01/20/21 12:09 PM   Specimen: Urine, Clean Catch  Result Value Ref Range Status   Specimen Description  URINE, CLEAN CATCH  Final   Special Requests   Final    NONE Performed at [facility], [place] [address] [place], [place] [address]    Culture >=100,000 COLONIES/mL ESCHERICHIA COLI (A)  Final   Report Status 01/22/2021 FINAL  Final   Organism ID, Bacteria ESCHERICHIA COLI (A)  Final      Susceptibility   Escherichia coli - MIC*    AMPICILLIN <=2 SENSITIVE Sensitive     CEFAZOLIN <=4 SENSITIVE Sensitive     CEFEPIME <=0.12 SENSITIVE Sensitive     CEFTRIAXONE <=0.25 SENSITIVE Sensitive     CIPROFLOXACIN >=4 RESISTANT Resistant     GENTAMICIN <=1 SENSITIVE Sensitive     IMIPENEM <=0.25 SENSITIVE Sensitive     NITROFURANTOIN <=16 SENSITIVE Sensitive     TRIMETH/SULFA <=20 SENSITIVE Sensitive     AMPICILLIN/SULBACTAM <=2 SENSITIVE Sensitive  PIP/TAZO <=4 SENSITIVE Sensitive     * >=100,000 COLONIES/mL ESCHERICHIA COLI  Respiratory (~20 pathogens) panel by PCR     Status: None   Collection Time: 01/20/21  3:45 PM   Specimen: Nasopharyngeal Swab; Respiratory  Result Value Ref Range Status   Adenovirus NOT DETECTED NOT DETECTED Final   Coronavirus 229E NOT DETECTED NOT DETECTED Final    Comment: (NOTE) The Coronavirus on the Respiratory Panel, DOES NOT test for the novel  Coronavirus (2019 nCoV)    Coronavirus HKU1 NOT DETECTED NOT DETECTED Final   Coronavirus NL63 NOT DETECTED NOT DETECTED Final   Coronavirus OC43 NOT DETECTED NOT DETECTED Final   Metapneumovirus NOT DETECTED NOT DETECTED Final   Rhinovirus / Enterovirus NOT DETECTED NOT DETECTED Final   Influenza A NOT DETECTED NOT DETECTED Final   Influenza B NOT DETECTED NOT DETECTED Final   Parainfluenza Virus 1 NOT DETECTED NOT DETECTED Final   Parainfluenza Virus 2 NOT DETECTED NOT DETECTED Final   Parainfluenza Virus 3 NOT DETECTED NOT DETECTED Final   Parainfluenza Virus 4 NOT DETECTED NOT DETECTED Final   Respiratory Syncytial Virus NOT DETECTED NOT DETECTED Final   Bordetella pertussis NOT DETECTED NOT DETECTED  Final   Bordetella Parapertussis NOT DETECTED NOT DETECTED Final   Chlamydophila pneumoniae NOT DETECTED NOT DETECTED Final   Mycoplasma pneumoniae NOT DETECTED NOT DETECTED Final    Comment: Performed at [facility], [place] [address], [place], [place] [address]  MRSA Next Gen by PCR, Nasal     Status: None   Collection Time: 01/21/21 11:41 PM   Specimen: Nasal Mucosa; Nasal Swab  Result Value Ref Range Status   MRSA by PCR Next Gen NOT DETECTED NOT DETECTED Final    Comment: (NOTE) The GeneXpert MRSA Assay (FDA approved for NASAL specimens only), is one component of a comprehensive MRSA colonization surveillance program. It is not intended to diagnose MRSA infection nor to guide or monitor treatment for MRSA infections. Test performance is not FDA approved in patients less than 47 years old. Performed at [facility], [place] [address] [place], [place] [address]      Radiology Studies: No results found.   LOS: 5 days   Antonieta Pert, MD [facility]  01/26/2021, 1:15 PM

## 2021-01-26 NOTE — Telephone Encounter (Signed)
Reviewed

## 2021-01-26 NOTE — Progress Notes (Signed)
Physical Therapy Treatment Patient Details Name: Courtney Castro MRN: [id number] DOB: 24-Oct-1933 Today's Date: 01/26/2021   History of Present Illness Pt is an 87 y/o female admitted 10/28 with malaise, SOB and generally not feeling well since return from trip to [place].  Work up showing severe sepsis due to multifocal PNA seen on imaging with respiratory failure needing 5L HFNC.  Noted demand ischemia with elevated troponin  168 now trending down at 137.  PMHx:  HTN, [place] CA in remission, SDH/SAH, IBS, macular degeneration.    PT Comments    Pt less lethargic and foggy today.  Still susceptible to fatigue, but improved overall.  Emphasis on transfers and progression of gait with the RW.  Pt's daughter has contracted for pt to have 24/7 assist for the first week or so.    Recommendations for follow up therapy are one component of a multi-disciplinary discharge planning process, led by the attending physician.  Recommendations may be updated based on patient status, additional functional criteria and insurance authorization.  Follow Up Recommendations  Home health PT     Assistance Recommended at Discharge Frequent or constant Supervision/Assistance  Equipment Recommendations  Other (comment) (TBA)    Recommendations for Other Services       Precautions / Restrictions Precautions Precautions: Fall     Mobility  Bed Mobility               General bed mobility comments: up in the recliner on arrival    Transfers Overall transfer level: Needs assistance Equipment used: Rolling walker (2 wheels) Transfers: Sit to/from Stand Sit to Stand: Min guard           General transfer comment: cues for hand placement    Ambulation/Gait Ambulation/Gait assistance: Min guard;Min assist Gait Distance (Feet): 120 Feet Assistive device: Rolling walker (2 wheels) Gait Pattern/deviations: Step-through pattern;Trunk flexed;Decreased stride length   Gait velocity interpretation:  1.31 - 2.62 ft/sec, indicative of limited community ambulator General Gait Details: pt able to mobilize without assist, but min tactile cues to keep pt propelling forward..   Stairs             Wheelchair Mobility    Modified Rankin (Stroke Patients Only)       Balance     Sitting balance-Leahy Scale: Fair       Standing balance-Leahy Scale: Poor Standing balance comment: reliant on the RW at this point.                            Cognition Arousal/Alertness: Awake/alert Behavior During Therapy: WFL for tasks assessed/performed Overall Cognitive Status: Within Functional Limits for tasks assessed                                 General Comments: less foggy today        Exercises      General Comments General comments (skin integrity, edema, etc.): VSS  raised to 2L during gait.  Pt on 1L now at rest.      Pertinent Vitals/Pain Pain Assessment: No/denies pain    Home Living                          Prior Function            PT Goals (current goals can now be found in the care plan section)  Acute Rehab PT Goals Patient Stated Goal: home, independent, continuing exercise program in the community PT Goal Formulation: With patient Time For Goal Achievement: 02/04/21 Potential to Achieve Goals: Good Progress towards PT goals: Progressing toward goals    Frequency    Min 3X/week      PT Plan Current plan remains appropriate    Co-evaluation              AM-PAC PT "6 Clicks" Mobility   Outcome Measure  Help needed turning from your back to your side while in a flat bed without using bedrails?: A Little Help needed moving from lying on your back to sitting on the side of a flat bed without using bedrails?: A Little Help needed moving to and from a bed to a chair (including a wheelchair)?: A Little Help needed standing up from a chair using your arms (e.g., wheelchair or bedside chair)?: A Little Help  needed to walk in hospital room?: A Little Help needed climbing 3-5 steps with a railing? : A Little 6 Click Score: 18    End of Session Equipment Utilized During Treatment: Oxygen Activity Tolerance: Patient tolerated treatment well;Patient limited by fatigue Patient left: with call bell/phone within reach;in bed;with bed alarm set;with family/visitor present Nurse Communication: Mobility status PT Visit Diagnosis: Other abnormalities of gait and mobility (R26.89);Unsteadiness on feet (R26.81);Difficulty in walking, not elsewhere classified (R26.2)     Time: 4982-6415 PT Time Calculation (min) (ACUTE ONLY): 24 min  Charges:  $Gait Training: 8-22 mins                     01/26/2021  Ginger Carne., PT Acute Rehabilitation Services [phone]  (pager) [phone]  (office)   [name] 01/26/2021, 1:58 PM

## 2021-01-27 DIAGNOSIS — A419 Sepsis, unspecified organism: Secondary | ICD-10-CM | POA: Diagnosis not present

## 2021-01-27 DIAGNOSIS — R6520 Severe sepsis without septic shock: Secondary | ICD-10-CM | POA: Diagnosis not present

## 2021-01-27 DIAGNOSIS — J9601 Acute respiratory failure with hypoxia: Secondary | ICD-10-CM | POA: Diagnosis not present

## 2021-01-27 LAB — HEPATIC FUNCTION PANEL
ALT: 171 U/L — ABNORMAL HIGH (ref 0–44)
AST: 180 U/L — ABNORMAL HIGH (ref 15–41)
Albumin: 2.6 g/dL — ABNORMAL LOW (ref 3.5–5.0)
Alkaline Phosphatase: 110 U/L (ref 38–126)
Bilirubin, Direct: 0.2 mg/dL (ref 0.0–0.2)
Indirect Bilirubin: 1 mg/dL — ABNORMAL HIGH (ref 0.3–0.9)
Total Bilirubin: 1.2 mg/dL (ref 0.3–1.2)
Total Protein: 5 g/dL — ABNORMAL LOW (ref 6.5–8.1)

## 2021-01-27 LAB — BASIC METABOLIC PANEL
Anion gap: 8 (ref 5–15)
BUN: 26 mg/dL — ABNORMAL HIGH (ref 8–23)
CO2: 28 mmol/L (ref 22–32)
Calcium: 9.6 mg/dL (ref 8.9–10.3)
Chloride: 102 mmol/L (ref 98–111)
Creatinine, Ser: 0.63 mg/dL (ref 0.44–1.00)
GFR, Estimated: >60 mL/min (ref >60)
Glucose, Bld: 107 mg/dL — ABNORMAL HIGH (ref 70–99)
Potassium: 3.8 mmol/L (ref 3.5–5.1)
Sodium: 138 mmol/L (ref 135–145)

## 2021-01-27 LAB — CBC
HCT: 45.9 % (ref 36.0–46.0)
Hemoglobin: 15.5 g/dL — ABNORMAL HIGH (ref 12.0–15.0)
MCH: 30.1 pg (ref 26.0–34.0)
MCHC: 33.8 g/dL (ref 30.0–36.0)
MCV: 89.1 fL (ref 80.0–100.0)
Platelets: 288 10*3/uL (ref 150–400)
RBC: 5.15 MIL/uL — ABNORMAL HIGH (ref 3.87–5.11)
RDW: 13.2 % (ref 11.5–15.5)
WBC: 12.5 10*3/uL — ABNORMAL HIGH (ref 4.0–10.5)
nRBC: 0 % (ref 0.0–0.2)

## 2021-01-27 MED ORDER — AZITHROMYCIN 250 MG PO TABS
500.0000 mg | ORAL_TABLET | Freq: Every day | ORAL | Status: AC
  Administered 2021-01-27 – 2021-01-28 (×2): 500 mg via ORAL
  Filled 2021-01-27 (×2): qty 2

## 2021-01-27 MED ORDER — SODIUM CHLORIDE 0.9 % IV SOLN
1.0000 g | INTRAVENOUS | Status: AC
  Administered 2021-01-27 – 2021-01-28 (×2): 1 g via INTRAVENOUS
  Filled 2021-01-27 (×2): qty 10

## 2021-01-27 NOTE — Progress Notes (Signed)
SATURATION QUALIFICATIONS: (This note is used to comply with regulatory documentation for home oxygen)  Patient Saturations on Room Air at Rest = 92%  Patient Saturations on Room Air while Ambulating = 84%  Patient Saturations on 2 Liters of oxygen while Ambulating = 90%  Please briefly explain why patient needs home oxygen: pt unable to maintain O2 saturations while walking

## 2021-01-27 NOTE — Progress Notes (Signed)
[facility] PROGRESS NOTE   OWEN PAGNOTTA  GPQ:[id number] DOB: Aug 13, 1933 DOA: 01/20/2021 PCP: Rita Ohara, MD   Brief Narrative/Hospital Course: Courtney Castro, 87 y.o. female with PMH of  Hypertension HLD, subdural and subarachnoid bleeding, history of breast cancer in remission presents to the ED with malaise, shortness of breath not feeling well ever since her trip from [place] past week, return on Thursday, October 20 and complains of having a fall while in [place] and also  been complaining of malaise generalized weakness in addition to dyspnea at rest and with exertion feeling warm but no known fever no chest pain.  In the ED,found to be febrile along with hyponatremia hypokalemia leukocytosis, elevated LFTs chest x-ray left lower lobe and bilateral upper lobes multifocal infection CT head was negative, patient was admitted on IV antibiotics, and has been needing oxygen for hypoxic respiratory failure.  Her blood pressure has been running high initially in 200s needing hydralazine multiple doses following which transiently unresponsive code stroke was activated and seen by neurology-CT head was done no acute finding, advised IV fluid hydration. Patient was treated for severe sepsis with multifocal pneumonia due to Legionella and Streptococcus.  She also needed oxygen for hypoxic respiratory failure. Patient is slowly improving, but continues to need oxygen.  Subjective:  This morning mildly tachypneic, but able to come off oxygen saturating 90 to 94%.  has leukocytosis Overall feeling better but appears weak  Assessment & Plan:  Severe Sepsis POA Multifocal pneumonia due to Legionella/Streptococcus: Acute hypoxic aspiratory failure due to pneumonia: Sepsis parameters overall improving but this morning with leukocytosis tachypnea. initially elevated procalcitonin 18.7 has improved to 3.6.  Continue telemetry nasal cannula.  We will extend ceftriaxone azithromycin for 2 more  doses, encourage incentive spirometry pulmonary toileting ambulation. Urine strep pneumonia antigen -positive and Legionella was positive.Blood culture no growth so far.  If he has further worsening WBC count we can repeat  x-ray. Recent Labs  Lab 01/20/21 1139 01/20/21 1441 01/21/21 0451 01/22/21 0153 01/23/21 0422 01/23/21 2027 01/24/21 0317 01/26/21 0312 01/27/21 0316  WBC  --   --    < > 10.1 10.8*  --  9.1 9.4 12.5*  LATICACIDVEN 1.6  --   --  1.4  --   --   --   --   --   PROCALCITON  --  18.74  --   --   --  3.63  --   --   --    < > = values in this interval not displayed.   Acute hypoxic respiratory failure Acute respiratory distress with increased work of breathing: Due to pneumonia.  Weaned off and doing well on room air this morning.Echo with G1 DD and  LVEF. Patient has been given IV Lasix intermittently.  Wt stable.  Hopefully she will continue to do well even on ambulation without oxygen. Net IO Since Admission: 772.42 mL [01/27/21 1037]  Filed Weights   01/20/21 1735 01/26/21 1314  Weight: 63.3 kg 61.3 kg    E. coli UTI: Completed antibiotics  Demand ischemia /elevated troponin 168>137:Elevated troponin likely due to demand ischemia in the setting of sepsis/hypoxia and accelerated hypertension.  Troponin downtrending.  No complain of chest pain.  Echo showed EF 55 to 60%, no R WMA, G1 DD, small pericardial effusion.EKG- ST non sp st changes.   Elevated D-dimer 4.4 :CT angio chest and DVT leg neg, likely from pneumonia.  Uncontrolled/accelerated hypertension: On admission BP very high in 190s to 200s  needing multiple doses of IV hydralazine> that caused transient hypotension-patient was symptomatic with unresponsiveness-code stroke was activated and seen by neurology negative CT brain  and patient mentation improved with improvement in blood pressure.  Continue p.o. atenolol, reduced dose of losartan at 50 mg.  Continue to hold HCTZ.  BP stable.  Hypokalemia:  resolved  Hypovolemic hyponatremia-resolved Hyponatremia due to Legionella as well Recent Labs  Lab 01/23/21 0422 01/24/21 0317 01/25/21 0356 01/26/21 0312 01/27/21 0316  NA 137 136 136 138 138    Transaminitis suspected to Legionella infection.  Acute viral panel negative.  Liver ultrasound shows S/P cholecystectomy status no focal lesion in the liver.  LFTs uptrending, monitor  and cont to hold statin. Recent Labs  Lab 01/21/21 0451 01/26/21 0312 01/27/21 0316  AST 82* 147* 180*  ALT 56* 131* 171*  ALKPHOS 88 95 110  BILITOT 1.4* 0.7 1.2  PROT 5.0* 4.6* 5.0*  ALBUMIN 2.3* 2.4* 2.6*   Dehydration with ketones in urine: Encourage oral intake  HLD HOLD statin 2/2 transaminitis.    Code stroke in the setting of acute hypotension from hydralazine: resolved. CT brain no acute finding.  Seen by neurology.  If recurrent episodes or somnolence can obtain MRI.  Minimize sudden drop in blood pressure, discontinued hydralazine IV.  She is nonfocal on exam.  Hypothyroidism:Continue home Synthroid  General weakness fall at home deconditioning debility: In the setting of sepsis.  Continue PT OT -PT planning for HHPT  GOC:DNR on admission.Monitor closely in the setting of sepsis and hypoxic respiratory failure.  DVT prophylaxis: enoxaparin (LOVENOX) injection 40 mg Start: 01/20/21 1600 Code Status:   Code Status: DNR Family Communication: plan of care discussed with patient at bedside. Status is: Inpatient  Remains inpatient for management of pneumonia sepsis  Currently not stable for discharge Anticipate discharge hopefully in the next day or so if LFTs stabilizes and patient is able to come off the oxygen  Objective: Vitals last 24 hrs: Vitals:   01/26/21 2319 01/27/21 0332 01/27/21 0759 01/27/21 0818  BP: (!) 143/81 138/69  (!) 149/80  Pulse: 77 89  93  Resp: (!) 24 (!) 29  16  Temp: 97.9 F (36.6 C) 98.3 F (36.8 C)    TempSrc: Oral Oral    SpO2: 96% 91% 94% 95%   Weight:      Height:       Weight change:  No intake or output data in the 24 hours ending 01/27/21 1037  Net IO Since Admission: 772.42 mL [01/27/21 1037]   Physical Examination: General exam: AAOx 3, elderly, frail weak HEENT:Oral mucosa moist, Ear/Nose WNL grossly, dentition normal. Respiratory system: bilaterally mild basal crackles, no use of accessory muscle Cardiovascular system: S1 & S2 +, No JVD,. Gastrointestinal system: Abdomen soft,NT,ND, BS+ Nervous System:Alert, awake, moving extremities and grossly nonfocal Extremities: no edema, distal peripheral pulses palpable.  Skin: No rashes,no icterus. MSK: Normal muscle bulk,tone, power   Medications reviewed:  Scheduled Meds:  atenolol  25 mg Oral Daily   azithromycin  500 mg Oral QHS   enoxaparin (LOVENOX) injection  40 mg Subcutaneous Q24H   guaiFENesin  600 mg Oral BID   ipratropium-albuterol  3 mL Nebulization BID   levothyroxine  25 mcg Oral Q0600   lidocaine  1 patch Transdermal Q24H   loratadine  10 mg Oral Daily   losartan  50 mg Oral Daily   sodium chloride flush  3 mL Intravenous Q12H   Continuous Infusions:  cefTRIAXone (ROCEPHIN)  IV     Diet Order             Diet regular Room service appropriate? Yes; Fluid consistency: Thin  Diet effective now                          Weight change:   Wt Readings from Last 3 Encounters:  01/26/21 61.3 kg  01/19/21 63.1 kg  01/16/21 63 kg     Consultants:see note  Procedures:see note Antimicrobials: Anti-infectives (From admission, onward)    Start     Dose/Rate Route Frequency Ordered Stop   01/27/21 2200  azithromycin (ZITHROMAX) tablet 500 mg        500 mg Oral Daily at bedtime 01/27/21 0752 01/29/21 2159   01/27/21 1800  cefTRIAXone (ROCEPHIN) 1 g in sodium chloride 0.9 % 100 mL IVPB        1 g 200 mL/hr over 30 Minutes Intravenous Every 24 hours 01/27/21 0752 01/29/21 1759   01/24/21 2200  azithromycin (ZITHROMAX) tablet 500 mg         500 mg Oral Daily at bedtime 01/24/21 1257 01/26/21 2041   01/24/21 1815  cefTRIAXone (ROCEPHIN) 1 g in sodium chloride 0.9 % 100 mL IVPB        1 g 200 mL/hr over 30 Minutes Intravenous Every 24 hours 01/24/21 1728 01/26/21 1918   01/23/21 2000  azithromycin (ZITHROMAX) 500 mg in sodium chloride 0.9 % 250 mL IVPB  Status:  Discontinued        500 mg 250 mL/hr over 60 Minutes Intravenous Every 24 hours 01/23/21 1910 01/24/21 1257   01/22/21 1700  piperacillin-tazobactam (ZOSYN) IVPB 3.375 g  Status:  Discontinued        3.375 g 12.5 mL/hr over 240 Minutes Intravenous Every 8 hours 01/22/21 1529 01/24/21 1728   01/21/21 1100  azithromycin (ZITHROMAX) 500 mg in sodium chloride 0.9 % 250 mL IVPB        500 mg 250 mL/hr over 60 Minutes Intravenous Every 24 hours 01/20/21 1334 01/22/21 1326   01/21/21 1100  cefTRIAXone (ROCEPHIN) 1 g in sodium chloride 0.9 % 100 mL IVPB  Status:  Discontinued        1 g 200 mL/hr over 30 Minutes Intravenous Every 24 hours 01/20/21 1334 01/22/21 1529   01/20/21 1130  cefTRIAXone (ROCEPHIN) 1 g in sodium chloride 0.9 % 100 mL IVPB        1 g 200 mL/hr over 30 Minutes Intravenous  Once 01/20/21 1120 01/20/21 1247   01/20/21 1130  azithromycin (ZITHROMAX) 500 mg in sodium chloride 0.9 % 250 mL IVPB        500 mg 250 mL/hr over 60 Minutes Intravenous  Once 01/20/21 1120 01/20/21 1458      Culture/Microbiology    Component Value Date/Time   SDES URINE, CLEAN CATCH 01/20/2021 1209   SPECREQUEST  01/20/2021 1209    NONE Performed at [facility], [place] [address] [place], [place] [address]    CULT >=100,000 COLONIES/mL ESCHERICHIA COLI (A) 01/20/2021 1209   REPTSTATUS 01/22/2021 FINAL 01/20/2021 1209    Other culture-see note  Unresulted Labs (From admission, onward)     Start     Ordered   01/27/21 2876  Basic metabolic panel  Daily,   R     Question:  Specimen collection method  Answer:  Lab=Lab collect   01/25/21 1326   01/27/21 0500  Hepatic  function panel  Daily,   R     Question:  Specimen collection method  Answer:  Lab=Lab collect   01/26/21 1316   01/26/21 0500  CBC  Daily,   R     Question:  Specimen collection method  Answer:  Lab=Lab collect   01/25/21 1326   01/21/21 0905  Expectorated Sputum Assessment w Gram Stain, Rflx to Resp Cult  Once,   R        01/21/21 0904           Data Reviewed: I have personally reviewed following labs and imaging studies CBC: Recent Labs  Lab 01/21/21 0451 01/22/21 0153 01/23/21 0422 01/24/21 0317 01/26/21 0312 01/27/21 0316  WBC 11.2* 10.1 10.8* 9.1 9.4 12.5*  NEUTROABS 10.6*  --   --   --   --   --   HGB 13.3 13.3 13.4 13.6 14.4 15.5*  HCT 39.4 38.7 39.9 40.2 43.0 45.9  MCV 88.1 87.4 89.3 89.1 89.4 89.1  PLT 213 231 222 201 249 076   Basic Metabolic Panel: Recent Labs  Lab 01/23/21 0422 01/24/21 0317 01/25/21 0356 01/26/21 0312 01/27/21 0316  NA 137 136 136 138 138  K 4.2 3.3* 3.5 3.7 3.8  CL 103 98 99 100 102  CO2 26 31 30  34* 28  GLUCOSE 123* 102* 97 104* 107*  BUN 27* 28* 30* 28* 26*  CREATININE 0.71 0.56 0.52 0.59 0.63  CALCIUM 9.4 9.2 9.2 9.3 9.6   GFR: Estimated Creatinine Clearance: 44.4 mL/min (by C-G formula based on SCr of 0.63 mg/dL). Liver Function Tests: Recent Labs  Lab 01/21/21 0451 01/26/21 0312 01/27/21 0316  AST 82* 147* 180*  ALT 56* 131* 171*  ALKPHOS 88 95 110  BILITOT 1.4* 0.7 1.2  PROT 5.0* 4.6* 5.0*  ALBUMIN 2.3* 2.4* 2.6*   No results for input(s): LIPASE, AMYLASE in the last 168 hours. No results for input(s): AMMONIA in the last 168 hours. Coagulation Profile: No results for input(s): INR, PROTIME in the last 168 hours. Cardiac Enzymes: No results for input(s): CKTOTAL, CKMB, CKMBINDEX, TROPONINI in the last 168 hours. BNP (last 3 results) No results for input(s): PROBNP in the last 8760 hours. HbA1C: No results for input(s): HGBA1C in the last 72 hours.  CBG: Recent Labs  Lab 01/20/21 2358  GLUCAP 130*    Lipid Profile: No results for input(s): CHOL, HDL, LDLCALC, TRIG, CHOLHDL, LDLDIRECT in the last 72 hours. Thyroid Function Tests: No results for input(s): TSH, T4TOTAL, FREET4, T3FREE, THYROIDAB in the last 72 hours. Anemia Panel: No results for input(s): VITAMINB12, FOLATE, FERRITIN, TIBC, IRON, RETICCTPCT in the last 72 hours. Sepsis Labs: Recent Labs  Lab 01/20/21 1139 01/20/21 1441 01/22/21 0153 01/23/21 2027  PROCALCITON  --  18.74  --  3.63  LATICACIDVEN 1.6  --  1.4  --     Recent Results (from the past 240 hour(s))  Resp Panel by RT-PCR (Flu A&B, Covid) Nasopharyngeal Swab     Status: None   Collection Time: 01/20/21  9:49 AM   Specimen: Nasopharyngeal Swab; Nasopharyngeal(NP) swabs in vial transport medium  Result Value Ref Range Status   SARS Coronavirus 2 by RT PCR NEGATIVE NEGATIVE Final    Comment: (NOTE) SARS-CoV-2 target nucleic acids are NOT DETECTED.  The SARS-CoV-2 RNA is generally detectable in upper respiratory specimens during the acute phase of infection. The lowest concentration of SARS-CoV-2 viral copies this assay can detect is 138 copies/mL. A negative result does not preclude SARS-Cov-2 infection and  should not be used as the sole basis for treatment or other patient management decisions. A negative result may occur with  improper specimen collection/handling, submission of specimen other than nasopharyngeal swab, presence of viral mutation(s) within the areas targeted by this assay, and inadequate number of viral copies(<138 copies/mL). A negative result must be combined with clinical observations, patient history, and epidemiological information. The expected result is Negative.  Fact Sheet for Patients:  EntrepreneurPulse.com.au  Fact Sheet for Healthcare Providers:  IncredibleEmployment.be  This test is no t yet approved or cleared by the [place] FDA and  has been authorized for detection and/or  diagnosis of SARS-CoV-2 by FDA under an Emergency Use Authorization (EUA). This EUA will remain  in effect (meaning this test can be used) for the duration of the COVID-19 declaration under Section 564(b)(1) of the Act, 21 U.S.C.section 360bbb-3(b)(1), unless the authorization is terminated  or revoked sooner.       Influenza A by PCR NEGATIVE NEGATIVE Final   Influenza B by PCR NEGATIVE NEGATIVE Final    Comment: (NOTE) The Xpert Xpress SARS-CoV-2/FLU/RSV plus assay is intended as an aid in the diagnosis of influenza from Nasopharyngeal swab specimens and should not be used as a sole basis for treatment. Nasal washings and aspirates are unacceptable for Xpert Xpress SARS-CoV-2/FLU/RSV testing.  Fact Sheet for Patients: EntrepreneurPulse.com.au  Fact Sheet for Healthcare Providers: IncredibleEmployment.be  This test is not yet approved or cleared by the [place] FDA and has been authorized for detection and/or diagnosis of SARS-CoV-2 by FDA under an Emergency Use Authorization (EUA). This EUA will remain in effect (meaning this test can be used) for the duration of the COVID-19 declaration under Section 564(b)(1) of the Act, 21 U.S.C. section 360bbb-3(b)(1), unless the authorization is terminated or revoked.  Performed at [facility], [place] [address], [place], [place] [address]   Culture, blood (Routine x 2)     Status: None   Collection Time: 01/20/21  9:50 AM   Specimen: BLOOD LEFT HAND  Result Value Ref Range Status   Specimen Description BLOOD LEFT HAND  Final   Special Requests   Final    BOTTLES DRAWN AEROBIC AND ANAEROBIC Blood Culture adequate volume   Culture   Final    NO GROWTH 5 DAYS Performed at [facility], [place] [address], [place], [place] [address]    Report Status 01/25/2021 FINAL  Final  Culture, blood (Routine x 2)     Status: None   Collection Time: 01/20/21 11:47 AM   Specimen: BLOOD  Result  Value Ref Range Status   Specimen Description BLOOD LEFT ANTECUBITAL  Final   Special Requests   Final    BOTTLES DRAWN AEROBIC ONLY Blood Culture adequate volume   Culture   Final    NO GROWTH 5 DAYS Performed at [facility], [place] [address] [place], [place] [address]    Report Status 01/25/2021 FINAL  Final  Urine Culture     Status: Abnormal   Collection Time: 01/20/21 12:09 PM   Specimen: Urine, Clean Catch  Result Value Ref Range Status   Specimen Description URINE, CLEAN CATCH  Final   Special Requests   Final    NONE Performed at [facility], [place] [address] [place], [place] [address]    Culture >=100,000 COLONIES/mL ESCHERICHIA COLI (A)  Final   Report Status 01/22/2021 FINAL  Final   Organism ID, Bacteria ESCHERICHIA COLI (A)  Final  Susceptibility   Escherichia coli - MIC*    AMPICILLIN <=2 SENSITIVE Sensitive     CEFAZOLIN <=4 SENSITIVE Sensitive     CEFEPIME <=0.12 SENSITIVE Sensitive     CEFTRIAXONE <=0.25 SENSITIVE Sensitive     CIPROFLOXACIN >=4 RESISTANT Resistant     GENTAMICIN <=1 SENSITIVE Sensitive     IMIPENEM <=0.25 SENSITIVE Sensitive     NITROFURANTOIN <=16 SENSITIVE Sensitive     TRIMETH/SULFA <=20 SENSITIVE Sensitive     AMPICILLIN/SULBACTAM <=2 SENSITIVE Sensitive     PIP/TAZO <=4 SENSITIVE Sensitive     * >=100,000 COLONIES/mL ESCHERICHIA COLI  Respiratory (~20 pathogens) panel by PCR     Status: None   Collection Time: 01/20/21  3:45 PM   Specimen: Nasopharyngeal Swab; Respiratory  Result Value Ref Range Status   Adenovirus NOT DETECTED NOT DETECTED Final   Coronavirus 229E NOT DETECTED NOT DETECTED Final    Comment: (NOTE) The Coronavirus on the Respiratory Panel, DOES NOT test for the novel  Coronavirus (2019 nCoV)    Coronavirus HKU1 NOT DETECTED NOT DETECTED Final   Coronavirus NL63 NOT DETECTED NOT DETECTED Final   Coronavirus OC43 NOT DETECTED NOT DETECTED Final   Metapneumovirus NOT DETECTED NOT DETECTED Final    Rhinovirus / Enterovirus NOT DETECTED NOT DETECTED Final   Influenza A NOT DETECTED NOT DETECTED Final   Influenza B NOT DETECTED NOT DETECTED Final   Parainfluenza Virus 1 NOT DETECTED NOT DETECTED Final   Parainfluenza Virus 2 NOT DETECTED NOT DETECTED Final   Parainfluenza Virus 3 NOT DETECTED NOT DETECTED Final   Parainfluenza Virus 4 NOT DETECTED NOT DETECTED Final   Respiratory Syncytial Virus NOT DETECTED NOT DETECTED Final   Bordetella pertussis NOT DETECTED NOT DETECTED Final   Bordetella Parapertussis NOT DETECTED NOT DETECTED Final   Chlamydophila pneumoniae NOT DETECTED NOT DETECTED Final   Mycoplasma pneumoniae NOT DETECTED NOT DETECTED Final    Comment: Performed at [facility], [place]. [address] [place], [place] [address]  MRSA Next Gen by PCR, Nasal     Status: None   Collection Time: 01/21/21 11:41 PM   Specimen: Nasal Mucosa; Nasal Swab  Result Value Ref Range Status   MRSA by PCR Next Gen NOT DETECTED NOT DETECTED Final    Comment: (NOTE) The GeneXpert MRSA Assay (FDA approved for NASAL specimens only), is one component of a comprehensive MRSA colonization surveillance program. It is not intended to diagnose MRSA infection nor to guide or monitor treatment for MRSA infections. Test performance is not FDA approved in patients less than 16 years old. Performed at [facility], [place] [address], [place], [place] [address]      Radiology Studies: US ABDOMEN LIMITED RUQ (LIVER/GB)  Result Date: 01/26/2021 CLINICAL DATA:  Abnormal liver function tests. EXAM: ULTRASOUND ABDOMEN LIMITED RIGHT UPPER QUADRANT COMPARISON:  None. FINDINGS: Gallbladder: Status post cholecystectomy. Common bile duct: Diameter: 4 mm. Liver: No focal lesion identified. Within normal limits in parenchymal echogenicity. Portal vein is patent on color Doppler imaging with normal direction of blood flow towards the liver. Other: At the trace volume right pleural effusion. IMPRESSION: 1. At the  trace volume right pleural effusion. 2. Status post cholecystectomy. Electronically Signed   By: Iven Finn M.D.   On: 01/26/2021 15:19     LOS: 6 days   Antonieta Pert, MD [facility]  01/27/2021, 10:37 AM

## 2021-01-27 NOTE — Plan of Care (Signed)

## 2021-01-28 DIAGNOSIS — I10 Essential (primary) hypertension: Secondary | ICD-10-CM

## 2021-01-28 DIAGNOSIS — A481 Legionnaires' disease: Secondary | ICD-10-CM

## 2021-01-28 DIAGNOSIS — E039 Hypothyroidism, unspecified: Secondary | ICD-10-CM

## 2021-01-28 DIAGNOSIS — J9601 Acute respiratory failure with hypoxia: Secondary | ICD-10-CM

## 2021-01-28 DIAGNOSIS — J189 Pneumonia, unspecified organism: Secondary | ICD-10-CM

## 2021-01-28 LAB — BASIC METABOLIC PANEL
Anion gap: 9 (ref 5–15)
BUN: 26 mg/dL — ABNORMAL HIGH (ref 8–23)
CO2: 25 mmol/L (ref 22–32)
Calcium: 9.1 mg/dL (ref 8.9–10.3)
Chloride: 103 mmol/L (ref 98–111)
Creatinine, Ser: 0.62 mg/dL (ref 0.44–1.00)
GFR, Estimated: >60 mL/min (ref >60)
Glucose, Bld: 90 mg/dL (ref 70–99)
Potassium: 3.8 mmol/L (ref 3.5–5.1)
Sodium: 137 mmol/L (ref 135–145)

## 2021-01-28 LAB — HEPATIC FUNCTION PANEL
ALT: 148 U/L — ABNORMAL HIGH (ref 0–44)
AST: 123 U/L — ABNORMAL HIGH (ref 15–41)
Albumin: 2.5 g/dL — ABNORMAL LOW (ref 3.5–5.0)
Alkaline Phosphatase: 100 U/L (ref 38–126)
Bilirubin, Direct: 0.2 mg/dL (ref 0.0–0.2)
Indirect Bilirubin: 0.6 mg/dL (ref 0.3–0.9)
Total Bilirubin: 0.8 mg/dL (ref 0.3–1.2)
Total Protein: 4.8 g/dL — ABNORMAL LOW (ref 6.5–8.1)

## 2021-01-28 LAB — CBC
HCT: 44.7 % (ref 36.0–46.0)
Hemoglobin: 14.9 g/dL (ref 12.0–15.0)
MCH: 29.9 pg (ref 26.0–34.0)
MCHC: 33.3 g/dL (ref 30.0–36.0)
MCV: 89.8 fL (ref 80.0–100.0)
Platelets: 265 10*3/uL (ref 150–400)
RBC: 4.98 MIL/uL (ref 3.87–5.11)
RDW: 13.3 % (ref 11.5–15.5)
WBC: 8.6 10*3/uL (ref 4.0–10.5)
nRBC: 0 % (ref 0.0–0.2)

## 2021-01-28 MED ORDER — GUAIFENESIN-DM 100-10 MG/5ML PO SYRP
5.0000 mL | ORAL_SOLUTION | ORAL | Status: DC | PRN
  Administered 2021-01-28 – 2021-01-29 (×5): 5 mL via ORAL
  Filled 2021-01-28 (×4): qty 5

## 2021-01-28 MED ORDER — IPRATROPIUM-ALBUTEROL 0.5-2.5 (3) MG/3ML IN SOLN: 3.0000 mL | RESPIRATORY_TRACT | Status: DC | PRN

## 2021-01-28 NOTE — Progress Notes (Signed)
TRH night cross cover note:  I was notified by RN of a reported 12 beat run of SVT that occurred just prior to start of night shift at 1850.  Patient reportedly asymptomatic at the time of this brief run, which was otherwise not associated with any acute change in vital signs.  Specifically, patient remains afebrile, no evidence of hypotension, respiratory rate of 16, and oxygen saturation 92 to 96% on room air consistent with oxygen saturations throughout today.  given hemodynamic stability and the asymptomatic nature of this brief run, will refrain from additional workup, including pursuit of stat labs, at this time, aside from ongoing monitoring on telemetry.  I asked RN to please notify me if the patient exhibits any additional runs of SVT, at which time I would likely expand evaluation via  labs including BMP and serum magnesium level.     Babs Bertin, DO Hospitalist

## 2021-01-28 NOTE — Plan of Care (Signed)

## 2021-01-28 NOTE — TOC Progression Note (Signed)
Transition of Care [facility]) - Progression Note    Patient Details  Name: Courtney Castro MRN: [id number] Date of Birth: 1933-04-20  Transition of [facility]) CM/SW Contact  [name], RN Phone Number: 01/28/2021, 3:12 PM  Clinical Narrative:    DME- O2 ordered through [employer] today, RW and 3/1 ordered to room yesterday. Patient has been set up w Alvis Lemmings for [facility] services. Spoke w patient and daughter re tentative DC plan for tomorrow, DME and HH needs and set up, questions answered.  Family will provide transportation home at time of DC.    Expected Discharge Plan: [place] Barriers to Discharge: Continued Medical Work up  Expected Discharge Plan and Services Expected Discharge Plan: [place]   Discharge Planning Services: CM Consult Post Acute Care Choice: Home Health, Durable Medical Equipment Living arrangements for the past 2 months: Single Family Home                 DME Arranged: Oxygen DME Agency: [employer] Date DME Agency Contacted: 01/28/21 Time DME Agency Contacted: [phone] Representative spoke with at DME Agency: [place]: PT, OT [place] Agency: [place] Date [place]: 01/25/21 Time [place]: 1445 Representative spoke with at [place]: [place] ([place]) Interventions    Readmission Risk Interventions No flowsheet data found.

## 2021-01-28 NOTE — Progress Notes (Signed)
PROGRESS NOTE    Courtney Castro  AOZ:[id number] DOB: 1933-09-17 DOA: 01/20/2021 PCP: Rita Ohara, MD    Brief Narrative:  Courtney Castro was admitted to the hospital with the working diagnosis of multifocal pneumonia, complicated with acute hypoxemic respiratory failure and severe sepsis (present on admission).   87 year old female past medical history for hypertension, hypothyroidism, history of intracranial bleed and breast cancer who presented with malaise and dyspnea.  She reported going to [place] to a spa about 7 days prior to hospitalization.  Since then she has been not feeling well, positive generalized weakness and ambulatory dysfunction with multiple falls at home.  Over the last few days prior to hospitalization she also had dyspnea at rest and on exertion prompted her to come to the hospital.  On her initial physical examination she was febrile 103 F, blood pressure 159/84, heart rate 112 and respiratory 26, oxygen saturation 91% on room air.  She was ill-looking appearing, dry mucous membranes, heart S1-S2, present, rhythmic, she had rhonchi bilaterally mostly in the lower lung fields, abdomen soft and no lower extremity edema.  Sodium 127, potassium 3.2, chloride 93, bicarb 21, glucose 136, BUN 26, creatinine 0.90.  High sensitive troponin 168-137.  White count 11.8, hemoglobin 14.6, hematocrit 40.8, platelets 212. SARS COVID-19 negative. Respiratory viral panel negative.  Urinalysis specific gravity 1.020, negative leukocytes.  Head CT negative for acute changes.  Chest CT with dense patchy infiltrate left upper lobe and bilateral lower lobes. Negative for pulm embolism.  Patient was placed on supplemental oxygen and broad-spectrum antibiotic therapy. Received intravenous fluids and supportive medical therapy.  Her urinary Legionella was positive, Streptococcus pneumonia initially positive following day negative.  EKG 108 bpm, normal axis, normal intervals, sinus rhythm  with poor R wave progression, no significant ST segment or T wave changes.  Assessment & Plan:   Principal Problem:   Multifocal pneumonia Active Problems:   Essential hypertension, benign   Hypothyroidism   Mixed hyperlipidemia   Subarachnoid hemorrhage (HCC)   History of breast cancer   Sepsis ([place])   Acute respiratory failure with hypoxia (HCC)   Legionella pneumonia ([place])    Acute hypoxemic respiratory failure due to multifocal pneumonia, left upper lobe and bilateral lower lobes. Positive for Legionella and Pneumococcus.  Her symptoms are improving but she continue to be very weak and deconditioned, positive cough.  Her oxymetry is 92% on room air. Wbc is 8,6 from 12,5.  Plan to continue antibiotic therapy with Azithromycin and Ceftriaxone Continue oxymetry monitoring, antitussive agents and airway clearing techniques (flutter valve and incentive spirometer).  Out of bed to chair tid with meals, continue Pt and Ot.   Follow on sputum cultures. Considering patient exposure to spa, I suspected predominant infection by Legionella.   Elevated liver enzymes due to sepsis, liver US with no acute findings, plan to follow up liver enzymes as outpatient.  Elevated cardiac enzymes due to respiratory failure, no acute coronary syndrome.   2. Urine infection due to E coli. Resolved, treated with antibiotic therapy.   3. Hypokalemia and hyponatremia renal function with serum cr at 0,62, K is 3,8 and serum bicarbonate at 25. Continue close follow up on electrolytes, patient is tolerating po well.   4. Dyslipidemia  On hold statin therapy until further improvement in liver function testing.   5. Metabolic encephalopathy rule out for CVA, this am patient is improved and mentation at baseline, her daughter is at the bedside   6. Hypothyroidism Continue with levothyroxine.  7. HTN.  Blood pressure systolic 361 to 443, will continue antihypertensive regimen.  Continue with atenolol  and losartan. Brief resolved episode of SVT.  Ok to discontinue telemetry monitoring.   Status is: Inpatient  Remains inpatient appropriate because: IV antibiotic therapy    DVT prophylaxis: Enoxaparin   Code Status:    full  Family Communication:  I spoke with patient's daughter at the bedside, we talked in detail about patient's condition, plan of care and prognosis and all questions were addressed.     Antimicrobials:  Ceftriaxone and azithromycin     Subjective: Patient with no nausea or vomiting no chest pain, continue to have dyspnea and cough, weakness and deconditioning.   Objective: Vitals:   01/28/21 0025 01/28/21 0320 01/28/21 0751 01/28/21 0830  BP: (!) 179/78 (!) 173/83  (!) 148/72  Pulse: 89 87 88   Resp: 20 20 18    Temp: 97.8 F (36.6 C) 98 F (36.7 C)    TempSrc: Oral Oral    SpO2: 90% 92% 92%   Weight:      Height:        Intake/Output Summary (Last 24 hours) at 01/28/2021 1052 Last data filed at 01/28/2021 0835 Gross per 24 hour  Intake --  Output 750 ml  Net -750 ml   Filed Weights   01/20/21 1735 01/26/21 1314  Weight: 63.3 kg 61.3 kg    Examination:   General: Not in pain or dyspnea, deconditioned  Neurology: Awake and alert, non focal  E ENT: no pallor, no icterus, oral mucosa moist Cardiovascular: No JVD. S1-S2 present, rhythmic, no gallops, rubs, or murmurs. No lower extremity edema. Pulmonary: positive breath sounds bilaterally, with no wheezing, rhonchi, positive rales on the left side. Gastrointestinal. Abdomen soft and non tender Skin. No rashes Musculoskeletal: no joint deformities     Data Reviewed: I have personally reviewed following labs and imaging studies  CBC: Recent Labs  Lab 01/23/21 0422 01/24/21 0317 01/26/21 0312 01/27/21 0316 01/28/21 0656  WBC 10.8* 9.1 9.4 12.5* 8.6  HGB 13.4 13.6 14.4 15.5* 14.9  HCT 39.9 40.2 43.0 45.9 44.7  MCV 89.3 89.1 89.4 89.1 89.8  PLT 222 201 249 288 154   Basic Metabolic  Panel: Recent Labs  Lab 01/24/21 0317 01/25/21 0356 01/26/21 0312 01/27/21 0316 01/28/21 0656  NA 136 136 138 138 137  K 3.3* 3.5 3.7 3.8 3.8  CL 98 99 100 102 103  CO2 31 30 34* 28 25  GLUCOSE 102* 97 104* 107* 90  BUN 28* 30* 28* 26* 26*  CREATININE 0.56 0.52 0.59 0.63 0.62  CALCIUM 9.2 9.2 9.3 9.6 9.1   GFR: Estimated Creatinine Clearance: 44.4 mL/min (by C-G formula based on SCr of 0.62 mg/dL). Liver Function Tests: Recent Labs  Lab 01/26/21 0312 01/27/21 0316 01/28/21 0656  AST 147* 180* 123*  ALT 131* 171* 148*  ALKPHOS 95 110 100  BILITOT 0.7 1.2 0.8  PROT 4.6* 5.0* 4.8*  ALBUMIN 2.4* 2.6* 2.5*   No results for input(s): LIPASE, AMYLASE in the last 168 hours. No results for input(s): AMMONIA in the last 168 hours. Coagulation Profile: No results for input(s): INR, PROTIME in the last 168 hours. Cardiac Enzymes: No results for input(s): CKTOTAL, CKMB, CKMBINDEX, TROPONINI in the last 168 hours. BNP (last 3 results) No results for input(s): PROBNP in the last 8760 hours. HbA1C: No results for input(s): HGBA1C in the last 72 hours. CBG: No results for input(s): GLUCAP in the last 168 hours.  Lipid Profile: No results for input(s): CHOL, HDL, LDLCALC, TRIG, CHOLHDL, LDLDIRECT in the last 72 hours. Thyroid Function Tests: No results for input(s): TSH, T4TOTAL, FREET4, T3FREE, THYROIDAB in the last 72 hours. Anemia Panel: No results for input(s): VITAMINB12, FOLATE, FERRITIN, TIBC, IRON, RETICCTPCT in the last 72 hours.    Radiology Studies: I have reviewed all of the imaging during this hospital visit personally     Scheduled Meds:  atenolol  25 mg Oral Daily   azithromycin  500 mg Oral QHS   enoxaparin (LOVENOX) injection  40 mg Subcutaneous Q24H   ipratropium-albuterol  3 mL Nebulization BID   levothyroxine  25 mcg Oral Q0600   lidocaine  1 patch Transdermal Q24H   loratadine  10 mg Oral Daily   losartan  50 mg Oral Daily   sodium chloride flush   3 mL Intravenous Q12H   Continuous Infusions:  cefTRIAXone (ROCEPHIN)  IV 1 g (01/27/21 1633)     LOS: 7 days        Nyair Depaulo Gerome Apley, MD

## 2021-01-29 DIAGNOSIS — E782 Mixed hyperlipidemia: Secondary | ICD-10-CM

## 2021-01-29 DIAGNOSIS — A481 Legionnaires' disease: Secondary | ICD-10-CM

## 2021-01-29 DIAGNOSIS — Z853 Personal history of malignant neoplasm of breast: Secondary | ICD-10-CM

## 2021-01-29 LAB — BASIC METABOLIC PANEL
Anion gap: 6 (ref 5–15)
BUN: 29 mg/dL — ABNORMAL HIGH (ref 8–23)
CO2: 27 mmol/L (ref 22–32)
Calcium: 9.1 mg/dL (ref 8.9–10.3)
Chloride: 107 mmol/L (ref 98–111)
Creatinine, Ser: 0.62 mg/dL (ref 0.44–1.00)
GFR, Estimated: >60 mL/min (ref >60)
Glucose, Bld: 99 mg/dL (ref 70–99)
Potassium: 4.2 mmol/L (ref 3.5–5.1)
Sodium: 140 mmol/L (ref 135–145)

## 2021-01-29 LAB — EXPECTORATED SPUTUM ASSESSMENT W GRAM STAIN, RFLX TO RESP C

## 2021-01-29 LAB — CBC
HCT: 43.8 % (ref 36.0–46.0)
Hemoglobin: 14.8 g/dL (ref 12.0–15.0)
MCH: 30.5 pg (ref 26.0–34.0)
MCHC: 33.8 g/dL (ref 30.0–36.0)
MCV: 90.1 fL (ref 80.0–100.0)
Platelets: 322 10*3/uL (ref 150–400)
RBC: 4.86 MIL/uL (ref 3.87–5.11)
RDW: 13.3 % (ref 11.5–15.5)
WBC: 7.8 10*3/uL (ref 4.0–10.5)
nRBC: 0 % (ref 0.0–0.2)

## 2021-01-29 MED ORDER — GUAIFENESIN-DM 100-10 MG/5ML PO SYRP: 5.0000 mL | ORAL_SOLUTION | Freq: Four times a day (QID) | ORAL | 0 refills | Status: DC | PRN

## 2021-01-29 NOTE — Discharge Summary (Signed)
Physician Discharge Summary  Courtney Castro:[id number] DOB: 1933-05-10 DOA: 01/20/2021  PCP: Rita Ohara, MD  Admit date: 01/20/2021 Discharge date: 01/29/2021  Admitted From: home  Disposition:  Home   Recommendations for Outpatient Follow-up and new medication changes:  Follow up with Dr. Tomi Bamberger in 7 to 10 days.  Patient completed antibiotic therapy during her hospitalization  Follow liver function as outpatient.   I spoke over the phone with the patient's  about patient's daughters condition, plan of care, prognosis and all questions were addressed.   Home Health: yes  Equipment/Devices: home 02   Discharge Condition: stable  CODE STATUS: full  Diet recommendation:  heart healthy   Brief/Interim Summary: Courtney Castro was admitted to the hospital with the working diagnosis of multifocal pneumonia, complicated with acute hypoxemic respiratory failure and severe sepsis (present on admission).    87 year old female past medical history for hypertension, hypothyroidism, history of intracranial bleed and breast cancer who presented with malaise and dyspnea.  She reported going to [place] to a spa about 7 days prior to hospitalization.  Since then she was not feeling well, positive generalized weakness and ambulatory dysfunction with multiple falls at home.  Over the last few days prior to hospitalization she also had dyspnea at rest and on exertion prompted her to come to the hospital.  On her initial physical examination she was febrile 103 F, blood pressure 159/84, heart rate 112 and respiratory rate 26, oxygen saturation 91% on room air.  She was ill-looking appearing, dry mucous membranes, heart S1-S2, present, rhythmic, she had rhonchi bilaterally mostly in the lower lung fields, abdomen soft and no lower extremity edema.   Sodium 127, potassium 3.2, chloride 93, bicarb 21, glucose 136, BUN 26, creatinine 0.90.  High sensitive troponin 168-137.  White count 11.8, hemoglobin 14.6,  hematocrit 40.8, platelets 212. SARS COVID-19 negative. Respiratory viral panel negative.   Urinalysis specific gravity 1.020, negative leukocytes.   Head CT negative for acute changes.   Chest CT with dense patchy infiltrate left upper lobe and bilateral lower lobes. Negative for pulm embolism.   Patient was placed on supplemental oxygen and broad-spectrum antibiotic therapy. Received intravenous fluids and supportive medical therapy.   Her urinary Legionella was positive, urine Streptococcus pneumonia antigen was initially positive but the following day was negative.   EKG 108 bpm, normal axis, normal intervals, sinus rhythm with poor R wave progression, no significant ST segment or T wave changes.   Acute hypoxemic respiratory failure due to multifocal pneumonia, left upper lobe and bilateral lower lobes.  Positive for Legionella and pneumococcus.  Severe sepsis, endorgan damage metabolic encephalopathy. Patient was admitted to the medical ward, she received intravenous antibiotic therapy with ceftriaxone and oral azithromycin with good toleration. Supplemental oxygen and bronchodilator therapy, airway clearing techniques with flutter valve incentive spirometer.   She did have elevated liver enzymes and troponin consistent with sepsis. Her liver enzymes are trending down, liver ultrasonography negative for acute changes.  Her blood cultures remain no growth.  Her discharge white cell count is 7.8. Her mentation has improved, encephalopathy resolved, patient ruled out for acute CVA. Considering her initial admission hyponatremia and history of being in a spot, I suspect pneumonia more likely related to Legionella.  2.  Urinary tract infection due to E. Coli (present on admission).  Patient received antibiotic therapy with ceftriaxone.  3.  Hypokalemia and hyponatremia.  Patient received intravenous fluids and potassium chloride. Her electrolytes were corrected. At discharge sodium  140, potassium  4.2, chloride 107, bicarb 27, glucose 99, BUN 29, creatinine 1.62.  4.  Hypertension/dyslipidemia.  Patient received atenolol and losartan for blood pressure control, statin therapy was held due to elevated liver enzymes. She had a brief episode of self resolving SVT during her hospitalization, noticed on telemetry.  At discharge will resume simvastatin.   5.  Hypothyroidism.  Continue levothyroxine.   Discharge Diagnoses:  Principal Problem:   Multifocal pneumonia Active Problems:   Essential hypertension, benign   Hypothyroidism   Mixed hyperlipidemia   Subarachnoid hemorrhage (HCC)   History of breast cancer   Sepsis ([place])   Acute respiratory failure with hypoxia ([place])   Legionella pneumonia ([place])    Discharge Instructions   Allergies as of 01/29/2021       Reactions   Percocet [oxycodone-acetaminophen] Other (See Comments)   Upset stomach        Medication List     STOP taking these medications    fluticasone 50 MCG/ACT nasal spray Commonly known as: FLONASE       TAKE these medications    acetaminophen 500 MG tablet Commonly known as: TYLENOL Take 1,000 mg by mouth every 6 (six) hours as needed.   atenolol 25 MG tablet Commonly known as: TENORMIN Take 1 tablet (25 mg total) by mouth daily.   CALTRATE 600 PO Take 1 tablet by mouth daily.   fexofenadine 180 MG tablet Commonly known as: ALLEGRA Take 180 mg by mouth daily.   GLUCOSAMINE PO Take 2,000 mg by mouth 2 (two) times daily.   guaiFENesin-dextromethorphan 100-10 MG/5ML syrup Commonly known as: ROBITUSSIN DM Take 5 mLs by mouth every 6 (six) hours as needed for cough.   levothyroxine 25 MCG tablet Commonly known as: SYNTHROID Take 1 tablet (25 mcg total) by mouth daily.   losartan-hydrochlorothiazide 100-25 MG tablet Commonly known as: HYZAAR Take 1 tablet by mouth daily.   PreserVision/Lutein Caps Take 1 capsule by mouth 2 (two) times daily.   CENTRUM SILVER  PO Take 1 tablet by mouth daily.   simvastatin 20 MG tablet Commonly known as: ZOCOR Take 1 tablet (20 mg total) by mouth at bedtime.   Vitamin D-3 25 MCG (1000 UT) Caps Take 1,000 Units by mouth daily.               Durable Medical Equipment  (From admission, onward)           Start     Ordered   01/28/21 1411  For home use only DME oxygen  Once       Question Answer Comment  Length of Need 6 Months   Mode or (Route) Nasal cannula   Liters per Minute 2   Frequency Continuous (stationary and portable oxygen unit needed)   Oxygen conserving device Yes   Oxygen delivery system Gas      01/28/21 1410   01/26/21 1011  For home use only DME 3 n 1  Once        01/26/21 1011   01/26/21 1011  For home use only DME Walker rolling  Once       Question Answer Comment  Walker: With [place] Wheels   Patient needs a walker to treat with the following condition Physical deconditioning      01/26/21 1011            Follow-up Information     [facility].   Specialty: Home Health Services Why: for home health services. they will  call you in 1-2 days to set up your first home appointment Contact information: [address] [place] [address] [phone]                Allergies  Allergen Reactions   Percocet [Oxycodone-Acetaminophen] Other (See Comments)    Upset stomach      Procedures/Studies: DG Chest 2 View  Result Date: 01/20/2021 CLINICAL DATA:  Sepsis EXAM: CHEST - 2 VIEW COMPARISON:  Chest x-ray dated April 5th 2010 FINDINGS: Cardiac and mediastinal contours within normal limits. Consolidations of the left lower lobe and bilateral upper lobes. Possible small left pleural effusion. No evidence of pneumothorax. IMPRESSION: Consolidations of the left lower lobe and bilateral upper lobes, concerning for multifocal infection. Recommend follow-up in 4-6 weeks to ensure resolution. Electronically Signed   By: Yetta Glassman M.D.   On: 01/20/2021 10:17   CT HEAD WO CONTRAST (5MM)  Result Date: 01/21/2021 CLINICAL DATA:  Initial evaluation for lethargy, cerebral hemorrhage suspected. EXAM: CT HEAD WITHOUT CONTRAST TECHNIQUE: Contiguous axial images were obtained from the base of the skull through the vertex without intravenous contrast. COMPARISON:  Prior CT from 01/20/2021. FINDINGS: Brain: Cerebral volume within normal limits for age. Probable minor chronic microvascular ischemic disease, also felt to be within normal limits for age. Small focus of chronic encephalomalacia noted at the anterior parasagittal left frontal lobe, which could be related to prior trauma and/or ischemia (series 3, image 19). No acute intracranial hemorrhage. No acute large vessel territory infarct. 1 cm right parafalcine meningioma along the anterior falx without associated mass effect (series 3, image 14). No other mass lesion, mass effect or midline shift. No hydrocephalus or extra-axial fluid collection. Vascular: No height asymmetric hyperdense vessel. Calcified atherosclerosis present at skull base. Skull: Scalp soft tissues within normal limits.  Calvarium intact. Sinuses/Orbits: Prior bilateral ocular lens replacement. Globes and orbital soft tissues demonstrate no acute finding. Paranasal sinuses are largely clear. No mastoid effusion. Other: Osteoarthritic changes noted about the TMJs bilaterally. IMPRESSION: 1. No acute intracranial abnormality. 2. Small focus of chronic encephalomalacia at the anterior parasagittal left frontal lobe, which could be related to prior trauma and/or ischemia. 3. 1 cm right parafalcine meningioma without mass effect. Electronically Signed   By: Jeannine Boga M.D.   On: 01/21/2021 02:40   CT HEAD WO CONTRAST (5MM)  Result Date: 01/20/2021 CLINICAL DATA:  Multiple recent falls, altered mental status EXAM: CT HEAD WITHOUT CONTRAST TECHNIQUE: Contiguous axial images were obtained from the base of  the skull through the vertex without intravenous contrast. COMPARISON:  07/17/2019 FINDINGS: Brain: No evidence of acute infarction, hemorrhage, hydrocephalus, extra-axial collection or mass lesion/mass effect. Periventricular and deep white matter hypodensity. Vascular: No hyperdense vessel or unexpected calcification. Skull: Normal. Negative for fracture or focal lesion. Sinuses/Orbits: No acute finding. Other: None. IMPRESSION: No acute intracranial pathology. Small-vessel white matter disease. Electronically Signed   By: Delanna Ahmadi M.D.   On: 01/20/2021 11:00   CT Angio Chest Pulmonary Embolism (PE) W or WO Contrast  Result Date: 01/20/2021 CLINICAL DATA:  Sepsis, positive D-dimer EXAM: CT ANGIOGRAPHY CHEST WITH CONTRAST TECHNIQUE: Multidetector CT imaging of the chest was performed using the standard protocol during bolus administration of intravenous contrast. Multiplanar CT image reconstructions and MIPs were obtained to evaluate the vascular anatomy. CONTRAST:  47mL OMNIPAQUE IOHEXOL 350 MG/ML SOLN COMPARISON:  Chest radiographs dated 01/12/2021 FINDINGS: Cardiovascular: Satisfactory opacification the bilateral pulmonary arteries to the segmental level. No evidence of pulmonary embolism. Although  not tailored for evaluation of the thoracic aorta, there is no evidence of thoracic aortic aneurysm or dissection. Heart is normal in size.  No pericardial effusion. Three vessel coronary atherosclerosis. Mediastinum/Nodes: No suspicious mediastinal lymphadenopathy. Visualized thyroid is unremarkable. Lungs/Pleura: Multifocal patchy opacities in the left upper lobe/lingula and bilateral lower lobes, left lower lobe predominant, suspicious for multifocal pneumonia. Small bilateral pleural effusions. Evaluation of the lung parenchyma is constrained by respiratory motion. Within that constraint, there are no suspicious pulmonary nodules. No pneumothorax. Upper Abdomen: Visualized upper abdomen is notable for  prior cholecystectomy and vascular calcifications. Musculoskeletal: Mild degenerative changes of the lower thoracic/upper lumbar spine. Review of the MIP images confirms the above findings. IMPRESSION: No evidence of pulmonary embolism. Multifocal pneumonia, left lower lobe predominant. Small bilateral pleural effusions. Aortic Atherosclerosis (ICD10-I70.0). Electronically Signed   By: Julian Hy M.D.   On: 01/20/2021 22:15   DG CHEST PORT 1 VIEW  Result Date: 01/21/2021 CLINICAL DATA:  Pneumonia. EXAM: PORTABLE CHEST 1 VIEW COMPARISON:  Radiograph and CT yesterday. FINDINGS: There is worsening left perihilar airspace disease. Small pleural effusions. Unchanged right basilar opacity and scattered atelectasis. The heart is normal in size. Aortic atherosclerosis. No pneumothorax or developing pulmonary edema. Right axillary surgical clips. IMPRESSION: 1. Worsening pneumonia in the left perihilar lung. 2. Unchanged small pleural effusions and right lung base opacity. Electronically Signed   By: Keith Rake M.D.   On: 01/21/2021 23:01   ECHOCARDIOGRAM COMPLETE  Result Date: 01/21/2021    ECHOCARDIOGRAM REPORT   Patient Name:   DENNICE TINDOL Date of Exam: 01/21/2021 Medical Rec #:  [id number]         Height:       64.0 in Accession #:    [id number]        Weight:       139.6 lb Date of Birth:  [birth year]         BSA:          1.679 m Patient Age:    69 years          BP:           146/71 mmHg Patient Gender: F                 HR:           124 bpm. Exam Location:  Inpatient Procedure: 2D Echo, Cardiac Doppler and Color Doppler Indications:    Elevated troponin  History:        Patient has no prior history of Echocardiogram examinations.                 Risk Factors:Hypertension.  Sonographer:    Jyl Heinz Referring Phys: [id number] [place] IMPRESSIONS  1. Left ventricular ejection fraction, by estimation, is 55 to 60%. The left ventricle has normal function. The left ventricle has no regional  wall motion abnormalities. Left ventricular diastolic parameters are consistent with Grade I diastolic dysfunction (impaired relaxation).  2. Right ventricular systolic function is hyperdynamic. The right ventricular size is normal.  3. A small pericardial effusion is present. The pericardial effusion is circumferential. Suspect no significant respiophasic chagne in mitral inflow velocity (respirometer note used). No RV or RA collapse but RV does not fully expand. Moderate pleural effusion.  4. The mitral valve is grossly normal. No evidence of mitral valve regurgitation. No evidence of mitral stenosis.  5. The aortic valve is tricuspid. There is moderate calcification of the aortic valve. Aortic valve regurgitation  is not visualized. Mild aortic valve sclerosis is present, with no evidence of aortic valve stenosis.  6. The inferior vena cava is normal in size with <50% respiratory variability, suggesting right atrial pressure of 8 mmHg. Comparison(s): No prior Echocardiogram. Conclusion(s)/Recommendation(s): Consider repeat limited echocardiogram for effusion in 48 hours or if change in clinical condition. FINDINGS  Left Ventricle: Left ventricular ejection fraction, by estimation, is 55 to 60%. The left ventricle has normal function. The left ventricle has no regional wall motion abnormalities. The left ventricular internal cavity size was normal in size. There is  no left ventricular hypertrophy. Left ventricular diastolic parameters are consistent with Grade I diastolic dysfunction (impaired relaxation). Right Ventricle: The right ventricular size is normal. No increase in right ventricular wall thickness. Right ventricular systolic function is hyperdynamic. Left Atrium: Left atrial size was normal in size. Right Atrium: Right atrial size was normal in size. Pericardium: A small pericardial effusion is present. The pericardial effusion is circumferential. Mitral Valve: The mitral valve is grossly normal. Mild  mitral annular calcification. No evidence of mitral valve regurgitation. No evidence of mitral valve stenosis. Tricuspid Valve: The tricuspid valve is normal in structure. Tricuspid valve regurgitation is mild. Aortic Valve: The aortic valve is tricuspid. There is moderate calcification of the aortic valve. Aortic valve regurgitation is not visualized. Mild aortic valve sclerosis is present, with no evidence of aortic valve stenosis. Aortic valve peak gradient measures 9.3 mmHg. Pulmonic Valve: The pulmonic valve was not well visualized. Pulmonic valve regurgitation is not visualized. No evidence of pulmonic stenosis. Aorta: The aortic root and ascending aorta are structurally normal, with no evidence of dilitation. Venous: The inferior vena cava is normal in size with less than 50% respiratory variability, suggesting right atrial pressure of 8 mmHg. IAS/Shunts: The atrial septum is grossly normal. Additional Comments: There is a moderate pleural effusion.  LEFT VENTRICLE PLAX 2D LVIDd:         4.30 cm     Diastology LVIDs:         2.80 cm     LV e' medial:    12.00 cm/s LV PW:         0.80 cm     LV E/e' medial:  6.8 LV IVS:        0.90 cm     LV e' lateral:   14.10 cm/s LVOT diam:     2.00 cm     LV E/e' lateral: 5.8 LV SV:         64 LV SV Index:   38 LVOT Area:     3.14 cm  LV Volumes (MOD) LV vol d, MOD A2C: 73.4 ml LV vol d, MOD A4C: 68.3 ml LV vol s, MOD A2C: 32.5 ml LV vol s, MOD A4C: 29.0 ml LV SV MOD A2C:     40.9 ml LV SV MOD A4C:     68.3 ml LV SV MOD BP:      41.3 ml RIGHT VENTRICLE             IVC RV Basal diam:  3.10 cm     IVC diam: 1.70 cm RV Mid diam:    2.20 cm RV S prime:     12.70 cm/s TAPSE (M-mode): 1.6 cm LEFT ATRIUM             Index        RIGHT ATRIUM          Index LA diam:  3.50 cm 2.08 cm/m   RA Area:     8.54 cm LA Vol (A2C):   39.4 ml 23.47 ml/m  RA Volume:   14.80 ml 8.82 ml/m LA Vol (A4C):   27.5 ml 16.38 ml/m LA Biplane Vol: 35.4 ml 21.09 ml/m  AORTIC VALVE AV Area  (Vmax): 2.24 cm AV Vmax:        152.50 cm/s AV Peak Grad:   9.3 mmHg LVOT Vmax:      108.50 cm/s LVOT Vmean:     77.400 cm/s LVOT VTI:       0.203 m  AORTA Ao Root diam: 2.70 cm Ao Asc diam:  3.20 cm MITRAL VALVE                TRICUSPID VALVE MV Area (PHT): 5.09 cm     TR Peak grad:   36.7 mmHg MV Decel Time: 149 msec     TR Vmax:        303.00 cm/s MV E velocity: 81.90 cm/s MV A velocity: 124.00 cm/s  SHUNTS MV E/A ratio:  0.66         Systemic VTI:  0.20 m                             Systemic Diam: 2.00 cm Rudean Haskell MD Electronically signed by Rudean Haskell MD Signature Date/Time: 01/21/2021/3:38:26 PM    Final    VAS [place] LOWER EXTREMITY VENOUS (DVT)  Result Date: 01/22/2021  Lower Venous DVT Study Patient Name:  KAMILLAH DIDONATO  Date of Exam:   01/21/2021 Medical Rec #: [id number]          Accession #:    [id number] Date of Birth: 1933/10/20          Patient Gender: F Patient Age:   44 years Exam [facility]  [facility] Procedure:      VAS [place] LOWER EXTREMITY VENOUS (DVT) Referring Phys: [place] --------------------------------------------------------------------------------  Indications: Swelling, and elevated D-Dimer.  Comparison Study: No prior study Performing Technologist: Darlin Coco RDMS, RVT  Examination Guidelines: A complete evaluation includes B-mode imaging, spectral Doppler, color Doppler, and power Doppler as needed of all accessible portions of each vessel. Bilateral testing is considered an integral part of a complete examination. Limited examinations for reoccurring indications may be performed as noted. The reflux portion of the exam is performed with the patient in reverse Trendelenburg.  +---------+---------------+---------+-----------+----------+--------------+ RIGHT    CompressibilityPhasicitySpontaneityPropertiesThrombus Aging +---------+---------------+---------+-----------+----------+--------------+ CFV      Full           Yes      Yes                                  +---------+---------------+---------+-----------+----------+--------------+ SFJ      Full                                                        +---------+---------------+---------+-----------+----------+--------------+ FV Prox  Full                                                        +---------+---------------+---------+-----------+----------+--------------+  FV Mid   Full                                                        +---------+---------------+---------+-----------+----------+--------------+ FV DistalFull                                                        +---------+---------------+---------+-----------+----------+--------------+ PFV      Full                                                        +---------+---------------+---------+-----------+----------+--------------+ POP      Full                                                        +---------+---------------+---------+-----------+----------+--------------+ PTV      Full                                                        +---------+---------------+---------+-----------+----------+--------------+ PERO     Full                                                        +---------+---------------+---------+-----------+----------+--------------+ Gastroc  Full                                                        +---------+---------------+---------+-----------+----------+--------------+   +---------+---------------+---------+-----------+----------+--------------+ LEFT     CompressibilityPhasicitySpontaneityPropertiesThrombus Aging +---------+---------------+---------+-----------+----------+--------------+ CFV      Full           Yes      Yes                                 +---------+---------------+---------+-----------+----------+--------------+ SFJ      Full                                                         +---------+---------------+---------+-----------+----------+--------------+ FV Prox  Full                                                        +---------+---------------+---------+-----------+----------+--------------+  FV Mid   Full                                                        +---------+---------------+---------+-----------+----------+--------------+ FV DistalFull                                                        +---------+---------------+---------+-----------+----------+--------------+ PFV      Full                                                        +---------+---------------+---------+-----------+----------+--------------+ POP      Full           Yes      Yes                                 +---------+---------------+---------+-----------+----------+--------------+ PTV      Full                                                        +---------+---------------+---------+-----------+----------+--------------+ PERO     Full                                                        +---------+---------------+---------+-----------+----------+--------------+ Gastroc  Full                                                        +---------+---------------+---------+-----------+----------+--------------+     Summary: RIGHT: - There is no evidence of deep vein thrombosis in the lower extremity.  LEFT: - There is no evidence of deep vein thrombosis in the lower extremity.  - A cystic structure is found in the popliteal fossa.  *See table(s) above for measurements and observations. Electronically signed by Jamelle Haring on 01/22/2021 at 9:30:29 AM.    Final    US ABDOMEN LIMITED RUQ (LIVER/GB)  Result Date: 01/26/2021 CLINICAL DATA:  Abnormal liver function tests. EXAM: ULTRASOUND ABDOMEN LIMITED RIGHT UPPER QUADRANT COMPARISON:  None. FINDINGS: Gallbladder: Status post cholecystectomy. Common bile duct: Diameter: 4 mm. Liver: No focal lesion  identified. Within normal limits in parenchymal echogenicity. Portal vein is patent on color Doppler imaging with normal direction of blood flow towards the liver. Other: At the trace volume right pleural effusion. IMPRESSION: 1. At the trace volume right pleural effusion. 2. Status post cholecystectomy. Electronically Signed   By: Iven Finn M.D.   On: 01/26/2021 15:19  Subjective: Patient is feeling better, no nausea or vomiting, cough and congestion is improving.   Discharge Exam: Vitals:   01/28/21 2149 01/29/21 0822  BP: 132/77 (!) 158/89  Pulse: 79 81  Resp: 20   Temp: 98 F (36.7 C)   SpO2: 94%    Vitals:   01/28/21 1218 01/28/21 1932 01/28/21 2149 01/29/21 0822  BP: 121/75  132/77 (!) 158/89  Pulse:   79 81  Resp:   20   Temp: 98 F (36.7 C)  98 F (36.7 C)   TempSrc: Oral  Oral   SpO2:  92% 94%   Weight:      Height:        General: Not in pain or dyspnea  Neurology: Awake and alert, non focal  E ENT: no pallor, no icterus, oral mucosa moist Cardiovascular: No JVD. S1-S2 present, rhythmic, no gallops, rubs, or murmurs. No lower extremity edema. Pulmonary: positive breath sounds bilaterally, with no wheezing, rhonchi or rales. Gastrointestinal. Abdomen soft and non tender Skin. No rashes Musculoskeletal: no joint deformities   The results of significant diagnostics from this hospitalization (including imaging, microbiology, ancillary and laboratory) are listed below for reference.     Microbiology: Recent Results (from the past 240 hour(s))  Resp Panel by RT-PCR (Flu A&B, Covid) Nasopharyngeal Swab     Status: None   Collection Time: 01/20/21  9:49 AM   Specimen: Nasopharyngeal Swab; Nasopharyngeal(NP) swabs in vial transport medium  Result Value Ref Range Status   SARS Coronavirus 2 by RT PCR NEGATIVE NEGATIVE Final    Comment: (NOTE) SARS-CoV-2 target nucleic acids are NOT DETECTED.  The SARS-CoV-2 RNA is generally detectable in upper  respiratory specimens during the acute phase of infection. The lowest concentration of SARS-CoV-2 viral copies this assay can detect is 138 copies/mL. A negative result does not preclude SARS-Cov-2 infection and should not be used as the sole basis for treatment or other patient management decisions. A negative result may occur with  improper specimen collection/handling, submission of specimen other than nasopharyngeal swab, presence of viral mutation(s) within the areas targeted by this assay, and inadequate number of viral copies(<138 copies/mL). A negative result must be combined with clinical observations, patient history, and epidemiological information. The expected result is Negative.  Fact Sheet for Patients:  EntrepreneurPulse.com.au  Fact Sheet for Healthcare Providers:  IncredibleEmployment.be  This test is no t yet approved or cleared by the [place] FDA and  has been authorized for detection and/or diagnosis of SARS-CoV-2 by FDA under an Emergency Use Authorization (EUA). This EUA will remain  in effect (meaning this test can be used) for the duration of the COVID-19 declaration under Section 564(b)(1) of the Act, 21 U.S.C.section 360bbb-3(b)(1), unless the authorization is terminated  or revoked sooner.       Influenza A by PCR NEGATIVE NEGATIVE Final   Influenza B by PCR NEGATIVE NEGATIVE Final    Comment: (NOTE) The Xpert Xpress SARS-CoV-2/FLU/RSV plus assay is intended as an aid in the diagnosis of influenza from Nasopharyngeal swab specimens and should not be used as a sole basis for treatment. Nasal washings and aspirates are unacceptable for Xpert Xpress SARS-CoV-2/FLU/RSV testing.  Fact Sheet for Patients: EntrepreneurPulse.com.au  Fact Sheet for Healthcare Providers: IncredibleEmployment.be  This test is not yet approved or cleared by the [place] FDA and has been  authorized for detection and/or diagnosis of SARS-CoV-2 by FDA under an Emergency Use Authorization (EUA). This EUA will remain in effect (meaning this test  can be used) for the duration of the COVID-19 declaration under Section 564(b)(1) of the Act, 21 U.S.C. section 360bbb-3(b)(1), unless the authorization is terminated or revoked.  Performed at [facility], [place] [address], [place], [place] [address]   Culture, blood (Routine x 2)     Status: None   Collection Time: 01/20/21  9:50 AM   Specimen: BLOOD LEFT HAND  Result Value Ref Range Status   Specimen Description BLOOD LEFT HAND  Final   Special Requests   Final    BOTTLES DRAWN AEROBIC AND ANAEROBIC Blood Culture adequate volume   Culture   Final    NO GROWTH 5 DAYS Performed at [facility], [place] [address] [place], [place] [address]    Report Status 01/25/2021 FINAL  Final  Culture, blood (Routine x 2)     Status: None   Collection Time: 01/20/21 11:47 AM   Specimen: BLOOD  Result Value Ref Range Status   Specimen Description BLOOD LEFT ANTECUBITAL  Final   Special Requests   Final    BOTTLES DRAWN AEROBIC ONLY Blood Culture adequate volume   Culture   Final    NO GROWTH 5 DAYS Performed at [facility], [place] [address] [place], [place] [address]    Report Status 01/25/2021 FINAL  Final  Urine Culture     Status: Abnormal   Collection Time: 01/20/21 12:09 PM   Specimen: Urine, Clean Catch  Result Value Ref Range Status   Specimen Description URINE, CLEAN CATCH  Final   Special Requests   Final    NONE Performed at [facility], [place] [address] [place], [place] [address]    Culture >=100,000 COLONIES/mL ESCHERICHIA COLI (A)  Final   Report Status 01/22/2021 FINAL  Final   Organism ID, Bacteria ESCHERICHIA COLI (A)  Final      Susceptibility   Escherichia coli - MIC*    AMPICILLIN <=2 SENSITIVE Sensitive     CEFAZOLIN <=4 SENSITIVE Sensitive     CEFEPIME <=0.12 SENSITIVE Sensitive      CEFTRIAXONE <=0.25 SENSITIVE Sensitive     CIPROFLOXACIN >=4 RESISTANT Resistant     GENTAMICIN <=1 SENSITIVE Sensitive     IMIPENEM <=0.25 SENSITIVE Sensitive     NITROFURANTOIN <=16 SENSITIVE Sensitive     TRIMETH/SULFA <=20 SENSITIVE Sensitive     AMPICILLIN/SULBACTAM <=2 SENSITIVE Sensitive     PIP/TAZO <=4 SENSITIVE Sensitive     * >=100,000 COLONIES/mL ESCHERICHIA COLI  Respiratory (~20 pathogens) panel by PCR     Status: None   Collection Time: 01/20/21  3:45 PM   Specimen: Nasopharyngeal Swab; Respiratory  Result Value Ref Range Status   Adenovirus NOT DETECTED NOT DETECTED Final   Coronavirus 229E NOT DETECTED NOT DETECTED Final    Comment: (NOTE) The Coronavirus on the Respiratory Panel, DOES NOT test for the novel  Coronavirus (2019 nCoV)    Coronavirus HKU1 NOT DETECTED NOT DETECTED Final   Coronavirus NL63 NOT DETECTED NOT DETECTED Final   Coronavirus OC43 NOT DETECTED NOT DETECTED Final   Metapneumovirus NOT DETECTED NOT DETECTED Final   Rhinovirus / Enterovirus NOT DETECTED NOT DETECTED Final   Influenza A NOT DETECTED NOT DETECTED Final   Influenza B NOT DETECTED NOT DETECTED Final   Parainfluenza Virus 1 NOT DETECTED NOT DETECTED Final   Parainfluenza Virus 2 NOT DETECTED NOT DETECTED Final   Parainfluenza Virus 3 NOT DETECTED NOT DETECTED Final   Parainfluenza Virus 4 NOT DETECTED NOT  DETECTED Final   Respiratory Syncytial Virus NOT DETECTED NOT DETECTED Final   Bordetella pertussis NOT DETECTED NOT DETECTED Final   Bordetella Parapertussis NOT DETECTED NOT DETECTED Final   Chlamydophila pneumoniae NOT DETECTED NOT DETECTED Final   Mycoplasma pneumoniae NOT DETECTED NOT DETECTED Final    Comment: Performed at [facility], [place] [address], [place], [place] [address]  MRSA Next Gen by PCR, Nasal     Status: None   Collection Time: 01/21/21 11:41 PM   Specimen: Nasal Mucosa; Nasal Swab  Result Value Ref Range Status   MRSA by PCR Next Gen NOT DETECTED NOT  DETECTED Final    Comment: (NOTE) The GeneXpert MRSA Assay (FDA approved for NASAL specimens only), is one component of a comprehensive MRSA colonization surveillance program. It is not intended to diagnose MRSA infection nor to guide or monitor treatment for MRSA infections. Test performance is not FDA approved in patients less than 75 years old. Performed at [facility], [place] [address], [place], [place] [address]      Labs: BNP (last 3 results) Recent Labs    01/22/21 0153  BNP 419.6*   Basic Metabolic Panel: Recent Labs  Lab 01/25/21 0356 01/26/21 0312 01/27/21 0316 01/28/21 0656 01/29/21 0318  NA 136 138 138 137 140  K 3.5 3.7 3.8 3.8 4.2  CL 99 100 102 103 107  CO2 30 34* 28 25 27   GLUCOSE 97 104* 107* 90 99  BUN 30* 28* 26* 26* 29*  CREATININE 0.52 0.59 0.63 0.62 0.62  CALCIUM 9.2 9.3 9.6 9.1 9.1   Liver Function Tests: Recent Labs  Lab 01/26/21 0312 01/27/21 0316 01/28/21 0656  AST 147* 180* 123*  ALT 131* 171* 148*  ALKPHOS 95 110 100  BILITOT 0.7 1.2 0.8  PROT 4.6* 5.0* 4.8*  ALBUMIN 2.4* 2.6* 2.5*   No results for input(s): LIPASE, AMYLASE in the last 168 hours. No results for input(s): AMMONIA in the last 168 hours. CBC: Recent Labs  Lab 01/24/21 0317 01/26/21 0312 01/27/21 0316 01/28/21 0656 01/29/21 0318  WBC 9.1 9.4 12.5* 8.6 7.8  HGB 13.6 14.4 15.5* 14.9 14.8  HCT 40.2 43.0 45.9 44.7 43.8  MCV 89.1 89.4 89.1 89.8 90.1  PLT 201 249 288 265 322   Cardiac Enzymes: No results for input(s): CKTOTAL, CKMB, CKMBINDEX, TROPONINI in the last 168 hours. BNP: Invalid input(s): POCBNP CBG: No results for input(s): GLUCAP in the last 168 hours. D-Dimer No results for input(s): DDIMER in the last 72 hours. Hgb A1c No results for input(s): HGBA1C in the last 72 hours. Lipid Profile No results for input(s): CHOL, HDL, LDLCALC, TRIG, CHOLHDL, LDLDIRECT in the last 72 hours. Thyroid function studies No results for input(s): TSH,  T4TOTAL, T3FREE, THYROIDAB in the last 72 hours.  Invalid input(s): FREET3 Anemia work up No results for input(s): VITAMINB12, FOLATE, FERRITIN, TIBC, IRON, RETICCTPCT in the last 72 hours. Urinalysis    Component Value Date/Time   COLORURINE YELLOW 01/20/2021 1209   APPEARANCEUR HAZY (A) 01/20/2021 1209   LABSPEC 1.020 01/20/2021 1209   LABSPEC 1.015 05/20/2017 1005   PHURINE 5.0 01/20/2021 1209   GLUCOSEU 50 (A) 01/20/2021 1209   HGBUR SMALL (A) 01/20/2021 [place] 01/20/2021 1209   BILIRUBINUR negative 05/20/2017 1005   BILIRUBINUR neg 04/23/2016 1032   KETONESUR 20 (A) 01/20/2021 1209   PROTEINUR 100 (A) 01/20/2021 1209   UROBILINOGEN negative 04/23/2016 1032   UROBILINOGEN 0.2 06/28/2008 1606  NITRITE NEGATIVE 01/20/2021 1209   LEUKOCYTESUR SMALL (A) 01/20/2021 1209   Sepsis Labs Invalid input(s): PROCALCITONIN,  WBC,  LACTICIDVEN Microbiology Recent Results (from the past 240 hour(s))  Resp Panel by RT-PCR (Flu A&B, Covid) Nasopharyngeal Swab     Status: None   Collection Time: 01/20/21  9:49 AM   Specimen: Nasopharyngeal Swab; Nasopharyngeal(NP) swabs in vial transport medium  Result Value Ref Range Status   SARS Coronavirus 2 by RT PCR NEGATIVE NEGATIVE Final    Comment: (NOTE) SARS-CoV-2 target nucleic acids are NOT DETECTED.  The SARS-CoV-2 RNA is generally detectable in upper respiratory specimens during the acute phase of infection. The lowest concentration of SARS-CoV-2 viral copies this assay can detect is 138 copies/mL. A negative result does not preclude SARS-Cov-2 infection and should not be used as the sole basis for treatment or other patient management decisions. A negative result may occur with  improper specimen collection/handling, submission of specimen other than nasopharyngeal swab, presence of viral mutation(s) within the areas targeted by this assay, and inadequate number of viral copies(<138 copies/mL). A negative result  must be combined with clinical observations, patient history, and epidemiological information. The expected result is Negative.  Fact Sheet for Patients:  EntrepreneurPulse.com.au  Fact Sheet for Healthcare Providers:  IncredibleEmployment.be  This test is no t yet approved or cleared by the [place] FDA and  has been authorized for detection and/or diagnosis of SARS-CoV-2 by FDA under an Emergency Use Authorization (EUA). This EUA will remain  in effect (meaning this test can be used) for the duration of the COVID-19 declaration under Section 564(b)(1) of the Act, 21 U.S.C.section 360bbb-3(b)(1), unless the authorization is terminated  or revoked sooner.       Influenza A by PCR NEGATIVE NEGATIVE Final   Influenza B by PCR NEGATIVE NEGATIVE Final    Comment: (NOTE) The Xpert Xpress SARS-CoV-2/FLU/RSV plus assay is intended as an aid in the diagnosis of influenza from Nasopharyngeal swab specimens and should not be used as a sole basis for treatment. Nasal washings and aspirates are unacceptable for Xpert Xpress SARS-CoV-2/FLU/RSV testing.  Fact Sheet for Patients: EntrepreneurPulse.com.au  Fact Sheet for Healthcare Providers: IncredibleEmployment.be  This test is not yet approved or cleared by the [place] FDA and has been authorized for detection and/or diagnosis of SARS-CoV-2 by FDA under an Emergency Use Authorization (EUA). This EUA will remain in effect (meaning this test can be used) for the duration of the COVID-19 declaration under Section 564(b)(1) of the Act, 21 U.S.C. section 360bbb-3(b)(1), unless the authorization is terminated or revoked.  Performed at [facility], [place] [address] [place], [place] [address]   Culture, blood (Routine x 2)     Status: None   Collection Time: 01/20/21  9:50 AM   Specimen: BLOOD LEFT HAND  Result Value Ref Range Status   Specimen  Description BLOOD LEFT HAND  Final   Special Requests   Final    BOTTLES DRAWN AEROBIC AND ANAEROBIC Blood Culture adequate volume   Culture   Final    NO GROWTH 5 DAYS Performed at [facility], [place] [address], [place], [place] [address]    Report Status 01/25/2021 FINAL  Final  Culture, blood (Routine x 2)     Status: None   Collection Time: 01/20/21 11:47 AM   Specimen: BLOOD  Result Value Ref Range Status   Specimen Description BLOOD LEFT ANTECUBITAL  Final   Special Requests   Final  BOTTLES DRAWN AEROBIC ONLY Blood Culture adequate volume   Culture   Final    NO GROWTH 5 DAYS Performed at [facility], [place] [address] [place], [place] [address]    Report Status 01/25/2021 FINAL  Final  Urine Culture     Status: Abnormal   Collection Time: 01/20/21 12:09 PM   Specimen: Urine, Clean Catch  Result Value Ref Range Status   Specimen Description URINE, CLEAN CATCH  Final   Special Requests   Final    NONE Performed at [facility], [place] [address] [place], [place] [address]    Culture >=100,000 COLONIES/mL ESCHERICHIA COLI (A)  Final   Report Status 01/22/2021 FINAL  Final   Organism ID, Bacteria ESCHERICHIA COLI (A)  Final      Susceptibility   Escherichia coli - MIC*    AMPICILLIN <=2 SENSITIVE Sensitive     CEFAZOLIN <=4 SENSITIVE Sensitive     CEFEPIME <=0.12 SENSITIVE Sensitive     CEFTRIAXONE <=0.25 SENSITIVE Sensitive     CIPROFLOXACIN >=4 RESISTANT Resistant     GENTAMICIN <=1 SENSITIVE Sensitive     IMIPENEM <=0.25 SENSITIVE Sensitive     NITROFURANTOIN <=16 SENSITIVE Sensitive     TRIMETH/SULFA <=20 SENSITIVE Sensitive     AMPICILLIN/SULBACTAM <=2 SENSITIVE Sensitive     PIP/TAZO <=4 SENSITIVE Sensitive     * >=100,000 COLONIES/mL ESCHERICHIA COLI  Respiratory (~20 pathogens) panel by PCR     Status: None   Collection Time: 01/20/21  3:45 PM   Specimen: Nasopharyngeal Swab; Respiratory  Result Value Ref Range Status   Adenovirus NOT  DETECTED NOT DETECTED Final   Coronavirus 229E NOT DETECTED NOT DETECTED Final    Comment: (NOTE) The Coronavirus on the Respiratory Panel, DOES NOT test for the novel  Coronavirus (2019 nCoV)    Coronavirus HKU1 NOT DETECTED NOT DETECTED Final   Coronavirus NL63 NOT DETECTED NOT DETECTED Final   Coronavirus OC43 NOT DETECTED NOT DETECTED Final   Metapneumovirus NOT DETECTED NOT DETECTED Final   Rhinovirus / Enterovirus NOT DETECTED NOT DETECTED Final   Influenza A NOT DETECTED NOT DETECTED Final   Influenza B NOT DETECTED NOT DETECTED Final   Parainfluenza Virus 1 NOT DETECTED NOT DETECTED Final   Parainfluenza Virus 2 NOT DETECTED NOT DETECTED Final   Parainfluenza Virus 3 NOT DETECTED NOT DETECTED Final   Parainfluenza Virus 4 NOT DETECTED NOT DETECTED Final   Respiratory Syncytial Virus NOT DETECTED NOT DETECTED Final   Bordetella pertussis NOT DETECTED NOT DETECTED Final   Bordetella Parapertussis NOT DETECTED NOT DETECTED Final   Chlamydophila pneumoniae NOT DETECTED NOT DETECTED Final   Mycoplasma pneumoniae NOT DETECTED NOT DETECTED Final    Comment: Performed at [facility], [place]. [address] [place], [place] [address]  MRSA Next Gen by PCR, Nasal     Status: None   Collection Time: 01/21/21 11:41 PM   Specimen: Nasal Mucosa; Nasal Swab  Result Value Ref Range Status   MRSA by PCR Next Gen NOT DETECTED NOT DETECTED Final    Comment: (NOTE) The GeneXpert MRSA Assay (FDA approved for NASAL specimens only), is one component of a comprehensive MRSA colonization surveillance program. It is not intended to diagnose MRSA infection nor to guide or monitor treatment for MRSA infections. Test performance is not FDA approved in patients less than 75 years old. Performed at [facility], [place] [address], [place], [place] [address]      Time coordinating  discharge: 45 minutes  SIGNED:   Tawni Millers, MD  [facility] 01/29/2021, 9:31 AM

## 2021-01-29 NOTE — Plan of Care (Signed)

## 2021-01-29 NOTE — Progress Notes (Signed)
AVS provided to pt and reviewed, all questions answered at this time. Sutures removed from fingers per order.

## 2021-01-29 NOTE — Plan of Care (Signed)
  Problem: Education: Goal: Knowledge of General Education information will improve Description: Including pain rating scale, medication(s)/side effects and non-pharmacologic comfort measures 01/29/2021 1124 by Chaney Born, RN Outcome: Adequate for Discharge 01/29/2021 1123 by Chaney Born, RN Outcome: Adequate for Discharge   Problem: Health Behavior/Discharge Planning: Goal: Ability to manage health-related needs will improve 01/29/2021 1124 by Chaney Born, RN Outcome: Adequate for Discharge 01/29/2021 1123 by Chaney Born, RN Outcome: Adequate for Discharge   Problem: Clinical Measurements: Goal: Ability to maintain clinical measurements within normal limits will improve 01/29/2021 1124 by Jeanann Lewandowsky D, RN Outcome: Adequate for Discharge 01/29/2021 1123 by Chaney Born, RN Outcome: Adequate for Discharge Goal: Will remain free from infection 01/29/2021 1124 by Chaney Born, RN Outcome: Adequate for Discharge 01/29/2021 1123 by Chaney Born, RN Outcome: Adequate for Discharge Goal: Diagnostic test results will improve 01/29/2021 1124 by Chaney Born, RN Outcome: Adequate for Discharge 01/29/2021 1123 by Chaney Born, RN Outcome: Adequate for Discharge Goal: Respiratory complications will improve 01/29/2021 1124 by Chaney Born, RN Outcome: Adequate for Discharge 01/29/2021 1123 by Chaney Born, RN Outcome: Adequate for Discharge Goal: Cardiovascular complication will be avoided 01/29/2021 1124 by Chaney Born, RN Outcome: Adequate for Discharge 01/29/2021 1123 by Chaney Born, RN Outcome: Adequate for Discharge   Problem: Activity: Goal: Risk for activity intolerance will decrease 01/29/2021 1124 by Chaney Born, RN Outcome: Adequate for Discharge 01/29/2021 1123 by Chaney Born, RN Outcome: Adequate for Discharge   Problem: Nutrition: Goal: Adequate nutrition will be maintained 01/29/2021 1124 by Chaney Born, RN Outcome: Adequate for Discharge 01/29/2021 1123  by Chaney Born, RN Outcome: Adequate for Discharge   Problem: Coping: Goal: Level of anxiety will decrease 01/29/2021 1124 by Chaney Born, RN Outcome: Adequate for Discharge 01/29/2021 1123 by Chaney Born, RN Outcome: Adequate for Discharge   Problem: Elimination: Goal: Will not experience complications related to bowel motility 01/29/2021 1124 by Chaney Born, RN Outcome: Adequate for Discharge 01/29/2021 1123 by Chaney Born, RN Outcome: Adequate for Discharge Goal: Will not experience complications related to urinary retention 01/29/2021 1124 by Chaney Born, RN Outcome: Adequate for Discharge 01/29/2021 1123 by Chaney Born, RN Outcome: Adequate for Discharge   Problem: Pain Managment: Goal: General experience of comfort will improve 01/29/2021 1124 by Chaney Born, RN Outcome: Adequate for Discharge 01/29/2021 1123 by Chaney Born, RN Outcome: Adequate for Discharge   Problem: Safety: Goal: Ability to remain free from injury will improve 01/29/2021 1124 by Jeanann Lewandowsky D, RN Outcome: Adequate for Discharge 01/29/2021 1123 by Chaney Born, RN Outcome: Adequate for Discharge   Problem: Skin Integrity: Goal: Risk for impaired skin integrity will decrease 01/29/2021 1124 by Chaney Born, RN Outcome: Adequate for Discharge 01/29/2021 1123 by Chaney Born, RN Outcome: Adequate for Discharge

## 2021-01-30 ENCOUNTER — Telehealth: Payer: Self-pay

## 2021-01-30 NOTE — Telephone Encounter (Signed)
Did you do the [facility] call? Document that part, and the scheduling can be done later.

## 2021-01-30 NOTE — Telephone Encounter (Signed)
Per the discharge summary:  Recommendations for Outpatient Follow-up and new medication changes:  Follow up with Dr. Tomi Bamberger in 7 to 10 days.  Patient completed antibiotic therapy during her hospitalization  Follow liver function as outpatient.

## 2021-01-30 NOTE — Telephone Encounter (Signed)
Pt. Showed up on my pt. Ping report as recently being released from the hospital. It did not mention following up here with you only a [facility] home health f/u in 1-2 days. Just wanted to check with you to see if you needed her to f/u here as well.

## 2021-02-01 DIAGNOSIS — S066X0D Traumatic subarachnoid hemorrhage without loss of consciousness, subsequent encounter: Secondary | ICD-10-CM | POA: Diagnosis not present

## 2021-02-01 DIAGNOSIS — J9601 Acute respiratory failure with hypoxia: Secondary | ICD-10-CM | POA: Diagnosis not present

## 2021-02-01 DIAGNOSIS — H3530 Unspecified macular degeneration: Secondary | ICD-10-CM | POA: Diagnosis not present

## 2021-02-01 DIAGNOSIS — J9811 Atelectasis: Secondary | ICD-10-CM | POA: Diagnosis not present

## 2021-02-01 DIAGNOSIS — M47816 Spondylosis without myelopathy or radiculopathy, lumbar region: Secondary | ICD-10-CM | POA: Diagnosis not present

## 2021-02-01 DIAGNOSIS — Z87440 Personal history of urinary (tract) infections: Secondary | ICD-10-CM | POA: Diagnosis not present

## 2021-02-01 DIAGNOSIS — K589 Irritable bowel syndrome without diarrhea: Secondary | ICD-10-CM | POA: Diagnosis not present

## 2021-02-01 DIAGNOSIS — M47817 Spondylosis without myelopathy or radiculopathy, lumbosacral region: Secondary | ICD-10-CM | POA: Diagnosis not present

## 2021-02-01 DIAGNOSIS — E039 Hypothyroidism, unspecified: Secondary | ICD-10-CM | POA: Diagnosis not present

## 2021-02-01 DIAGNOSIS — I700 Atherosclerosis of aorta: Secondary | ICD-10-CM | POA: Diagnosis not present

## 2021-02-01 DIAGNOSIS — I10 Essential (primary) hypertension: Secondary | ICD-10-CM | POA: Diagnosis not present

## 2021-02-01 DIAGNOSIS — Z9981 Dependence on supplemental oxygen: Secondary | ICD-10-CM | POA: Diagnosis not present

## 2021-02-01 DIAGNOSIS — M47818 Spondylosis without myelopathy or radiculopathy, sacral and sacrococcygeal region: Secondary | ICD-10-CM | POA: Diagnosis not present

## 2021-02-01 DIAGNOSIS — E782 Mixed hyperlipidemia: Secondary | ICD-10-CM | POA: Diagnosis not present

## 2021-02-01 DIAGNOSIS — R296 Repeated falls: Secondary | ICD-10-CM | POA: Diagnosis not present

## 2021-02-01 DIAGNOSIS — Z9181 History of falling: Secondary | ICD-10-CM | POA: Diagnosis not present

## 2021-02-01 DIAGNOSIS — M6281 Muscle weakness (generalized): Secondary | ICD-10-CM | POA: Diagnosis not present

## 2021-02-01 DIAGNOSIS — K5790 Diverticulosis of intestine, part unspecified, without perforation or abscess without bleeding: Secondary | ICD-10-CM | POA: Diagnosis not present

## 2021-02-01 LAB — CULTURE, RESPIRATORY W GRAM STAIN

## 2021-02-03 ENCOUNTER — Telehealth: Payer: Self-pay | Admitting: *Deleted

## 2021-02-03 DIAGNOSIS — Z9181 History of falling: Secondary | ICD-10-CM | POA: Diagnosis not present

## 2021-02-03 DIAGNOSIS — I700 Atherosclerosis of aorta: Secondary | ICD-10-CM | POA: Diagnosis not present

## 2021-02-03 DIAGNOSIS — I10 Essential (primary) hypertension: Secondary | ICD-10-CM | POA: Diagnosis not present

## 2021-02-03 DIAGNOSIS — R296 Repeated falls: Secondary | ICD-10-CM | POA: Diagnosis not present

## 2021-02-03 DIAGNOSIS — E782 Mixed hyperlipidemia: Secondary | ICD-10-CM | POA: Diagnosis not present

## 2021-02-03 DIAGNOSIS — Z9981 Dependence on supplemental oxygen: Secondary | ICD-10-CM | POA: Diagnosis not present

## 2021-02-03 DIAGNOSIS — H3530 Unspecified macular degeneration: Secondary | ICD-10-CM | POA: Diagnosis not present

## 2021-02-03 DIAGNOSIS — J9601 Acute respiratory failure with hypoxia: Secondary | ICD-10-CM | POA: Diagnosis not present

## 2021-02-03 DIAGNOSIS — Z87440 Personal history of urinary (tract) infections: Secondary | ICD-10-CM | POA: Diagnosis not present

## 2021-02-03 DIAGNOSIS — E039 Hypothyroidism, unspecified: Secondary | ICD-10-CM | POA: Diagnosis not present

## 2021-02-03 DIAGNOSIS — K589 Irritable bowel syndrome without diarrhea: Secondary | ICD-10-CM | POA: Diagnosis not present

## 2021-02-03 DIAGNOSIS — M47817 Spondylosis without myelopathy or radiculopathy, lumbosacral region: Secondary | ICD-10-CM | POA: Diagnosis not present

## 2021-02-03 DIAGNOSIS — K5790 Diverticulosis of intestine, part unspecified, without perforation or abscess without bleeding: Secondary | ICD-10-CM | POA: Diagnosis not present

## 2021-02-03 DIAGNOSIS — S066X0D Traumatic subarachnoid hemorrhage without loss of consciousness, subsequent encounter: Secondary | ICD-10-CM | POA: Diagnosis not present

## 2021-02-03 DIAGNOSIS — M47816 Spondylosis without myelopathy or radiculopathy, lumbar region: Secondary | ICD-10-CM | POA: Diagnosis not present

## 2021-02-03 DIAGNOSIS — J9811 Atelectasis: Secondary | ICD-10-CM | POA: Diagnosis not present

## 2021-02-03 DIAGNOSIS — M6281 Muscle weakness (generalized): Secondary | ICD-10-CM | POA: Diagnosis not present

## 2021-02-03 DIAGNOSIS — M47818 Spondylosis without myelopathy or radiculopathy, sacral and sacrococcygeal region: Secondary | ICD-10-CM | POA: Diagnosis not present

## 2021-02-03 NOTE — Telephone Encounter (Signed)
I spoke with patient and scheduled her for next Thursday. She will check with [name] this weekend and see if she can get her here. I will follow up Monday and if she cannot-I will arrange for transportation. Can you please send back to me so I can follow up Monday, thanks.

## 2021-02-03 NOTE — Telephone Encounter (Signed)
Claiborne Billings, PT with [facility] called to report patient's bp. 96/58 @ rest and 82/56 standing. Only mild intermittent dizziness on occasion. She did some walking and exercises and bp was 102/62 and patient was feeling okay.

## 2021-02-03 NOTE — Telephone Encounter (Signed)
Noted.  She needs to stay well hydrated. She is due for a hospital f/u.  Wasn't scheduled when [facility]e called her due to concerns about transportation.  We should be able to help with that if an issue.  Please schedule hospital follow-up for next week.

## 2021-02-07 DIAGNOSIS — K589 Irritable bowel syndrome without diarrhea: Secondary | ICD-10-CM | POA: Diagnosis not present

## 2021-02-07 DIAGNOSIS — H3530 Unspecified macular degeneration: Secondary | ICD-10-CM | POA: Diagnosis not present

## 2021-02-07 DIAGNOSIS — S066X0D Traumatic subarachnoid hemorrhage without loss of consciousness, subsequent encounter: Secondary | ICD-10-CM | POA: Diagnosis not present

## 2021-02-07 DIAGNOSIS — J9811 Atelectasis: Secondary | ICD-10-CM | POA: Diagnosis not present

## 2021-02-07 DIAGNOSIS — I700 Atherosclerosis of aorta: Secondary | ICD-10-CM | POA: Diagnosis not present

## 2021-02-07 DIAGNOSIS — Z9181 History of falling: Secondary | ICD-10-CM | POA: Diagnosis not present

## 2021-02-07 DIAGNOSIS — Z87440 Personal history of urinary (tract) infections: Secondary | ICD-10-CM | POA: Diagnosis not present

## 2021-02-07 DIAGNOSIS — M6281 Muscle weakness (generalized): Secondary | ICD-10-CM | POA: Diagnosis not present

## 2021-02-07 DIAGNOSIS — I10 Essential (primary) hypertension: Secondary | ICD-10-CM | POA: Diagnosis not present

## 2021-02-07 DIAGNOSIS — R296 Repeated falls: Secondary | ICD-10-CM | POA: Diagnosis not present

## 2021-02-07 DIAGNOSIS — Z9981 Dependence on supplemental oxygen: Secondary | ICD-10-CM | POA: Diagnosis not present

## 2021-02-07 DIAGNOSIS — K5790 Diverticulosis of intestine, part unspecified, without perforation or abscess without bleeding: Secondary | ICD-10-CM | POA: Diagnosis not present

## 2021-02-07 DIAGNOSIS — E782 Mixed hyperlipidemia: Secondary | ICD-10-CM | POA: Diagnosis not present

## 2021-02-07 DIAGNOSIS — M47817 Spondylosis without myelopathy or radiculopathy, lumbosacral region: Secondary | ICD-10-CM | POA: Diagnosis not present

## 2021-02-07 DIAGNOSIS — M47818 Spondylosis without myelopathy or radiculopathy, sacral and sacrococcygeal region: Secondary | ICD-10-CM | POA: Diagnosis not present

## 2021-02-07 DIAGNOSIS — M47816 Spondylosis without myelopathy or radiculopathy, lumbar region: Secondary | ICD-10-CM | POA: Diagnosis not present

## 2021-02-07 DIAGNOSIS — E039 Hypothyroidism, unspecified: Secondary | ICD-10-CM | POA: Diagnosis not present

## 2021-02-07 DIAGNOSIS — J9601 Acute respiratory failure with hypoxia: Secondary | ICD-10-CM | POA: Diagnosis not present

## 2021-02-09 ENCOUNTER — Encounter: Payer: Self-pay | Admitting: Family Medicine

## 2021-02-09 ENCOUNTER — Other Ambulatory Visit: Payer: Self-pay

## 2021-02-09 ENCOUNTER — Inpatient Hospital Stay: Payer: Medicare Other | Admitting: Family Medicine

## 2021-02-09 ENCOUNTER — Ambulatory Visit (INDEPENDENT_AMBULATORY_CARE_PROVIDER_SITE_OTHER): Payer: Medicare Other | Admitting: Family Medicine

## 2021-02-09 VITALS — BP 110/60 | HR 84 | Temp 98.5°F | Ht 64.0 in | Wt 125.0 lb

## 2021-02-09 DIAGNOSIS — R059 Cough, unspecified: Secondary | ICD-10-CM

## 2021-02-09 DIAGNOSIS — M47817 Spondylosis without myelopathy or radiculopathy, lumbosacral region: Secondary | ICD-10-CM | POA: Diagnosis not present

## 2021-02-09 DIAGNOSIS — I700 Atherosclerosis of aorta: Secondary | ICD-10-CM | POA: Diagnosis not present

## 2021-02-09 DIAGNOSIS — R7989 Other specified abnormal findings of blood chemistry: Secondary | ICD-10-CM | POA: Diagnosis not present

## 2021-02-09 DIAGNOSIS — M47816 Spondylosis without myelopathy or radiculopathy, lumbar region: Secondary | ICD-10-CM | POA: Diagnosis not present

## 2021-02-09 DIAGNOSIS — J9601 Acute respiratory failure with hypoxia: Secondary | ICD-10-CM | POA: Diagnosis not present

## 2021-02-09 DIAGNOSIS — J9811 Atelectasis: Secondary | ICD-10-CM | POA: Diagnosis not present

## 2021-02-09 DIAGNOSIS — E782 Mixed hyperlipidemia: Secondary | ICD-10-CM | POA: Diagnosis not present

## 2021-02-09 DIAGNOSIS — Z8701 Personal history of pneumonia (recurrent): Secondary | ICD-10-CM

## 2021-02-09 DIAGNOSIS — Z9181 History of falling: Secondary | ICD-10-CM | POA: Diagnosis not present

## 2021-02-09 DIAGNOSIS — E46 Unspecified protein-calorie malnutrition: Secondary | ICD-10-CM | POA: Diagnosis not present

## 2021-02-09 DIAGNOSIS — M47818 Spondylosis without myelopathy or radiculopathy, sacral and sacrococcygeal region: Secondary | ICD-10-CM | POA: Diagnosis not present

## 2021-02-09 DIAGNOSIS — M6281 Muscle weakness (generalized): Secondary | ICD-10-CM | POA: Diagnosis not present

## 2021-02-09 DIAGNOSIS — Z9981 Dependence on supplemental oxygen: Secondary | ICD-10-CM | POA: Diagnosis not present

## 2021-02-09 DIAGNOSIS — H3530 Unspecified macular degeneration: Secondary | ICD-10-CM | POA: Diagnosis not present

## 2021-02-09 DIAGNOSIS — E039 Hypothyroidism, unspecified: Secondary | ICD-10-CM | POA: Diagnosis not present

## 2021-02-09 DIAGNOSIS — K5790 Diverticulosis of intestine, part unspecified, without perforation or abscess without bleeding: Secondary | ICD-10-CM | POA: Diagnosis not present

## 2021-02-09 DIAGNOSIS — Z87440 Personal history of urinary (tract) infections: Secondary | ICD-10-CM | POA: Diagnosis not present

## 2021-02-09 DIAGNOSIS — S066X0D Traumatic subarachnoid hemorrhage without loss of consciousness, subsequent encounter: Secondary | ICD-10-CM | POA: Diagnosis not present

## 2021-02-09 DIAGNOSIS — K589 Irritable bowel syndrome without diarrhea: Secondary | ICD-10-CM | POA: Diagnosis not present

## 2021-02-09 DIAGNOSIS — R296 Repeated falls: Secondary | ICD-10-CM | POA: Diagnosis not present

## 2021-02-09 DIAGNOSIS — I10 Essential (primary) hypertension: Secondary | ICD-10-CM | POA: Diagnosis not present

## 2021-02-09 LAB — POCT URINALYSIS DIP (PROADVANTAGE DEVICE)
Bilirubin, UA: NEGATIVE
Blood, UA: NEGATIVE
Glucose, UA: 250 mg/dL — AB
Ketones, POC UA: NEGATIVE mg/dL
Nitrite, UA: NEGATIVE
Protein Ur, POC: NEGATIVE mg/dL
Specific Gravity, Urine: 1.015
Urobilinogen, Ur: NEGATIVE
pH, UA: 6 (ref 5.0–8.0)

## 2021-02-09 NOTE — Progress Notes (Signed)
Chief Complaint  Patient presents with   Hospitalization Follow-up    Follow up. Has two different people that are coming to the house 12hr shifts. Never alone anymore. Helping with meals. Took her first shower yesterday-got a shower chair. Using a walker at home. PT is coming to the house 2-3 times a week. OT came yesterday as well. She does not prepare her own meals. Not having any trouble with her breathing at the moment. Biggest concern is her 9 and when and how much longer to be using.    RX question    Was not given any Flonase while at hospital, has at home and would like to make sure she can resume.    Transition of Care visit. Patient was hospitalized 10/28-11/6, multifocal pneumonia, sepsis on admission, acute hypoxemic respiratory failure. +tests for Legionella and pneumococcus (though pneumococcal testing on repeat was negative). She was also noted to have UTI (treated with CTX) Treated with ceftriaxone and oral azithro. Sent home with oxygen. Doesn't have portable oxygen.  Uses it much of the time at home, but not when walking around (due to cords, risk of falling). She is wondering how much she needs to use it.  PT has checked pulse ox, and doesn't think she needs it all the time. Sleeps with it.  She reports feeling "much better thana week ago when I came home" She has caretakers at home round the clock helping her. She has a bedside commode which she mainly uses at night, using walker to bathroom during the day. She feels she is slowly getting stronger. Getting home PT, 2-3x/week.  She is coughing some, but she relates this to her allergies, and not using Flonase, asking if she can restart. She has slight runny nose. Denies cough with walking/activity. Can't tell if getting winded, due to some overall fatigue. She is  not coughing up any phlegm. She denies any fever or chills (but also had been unaware of her 103 temp that she had on arrival to ER).  Discharge summary and  recent labs were reviewed.  She had elevated LFT's in the hospital, due fo recheck. Simva was held while in hospital, told to resume at d/c, and she has been taking it.  Lab Results  Component Value Date   ALT 148 (H) 01/28/2021   AST 123 (H) 01/28/2021   ALKPHOS 100 01/28/2021   BILITOT 0.8 01/28/2021   Albumin was down to 2.5, Total protein 4.8 upon discharge.  She has been drinking more water recently, 40-55 ounces/d. Appetite is improving. She denies dizziness/LH   PMH, PSH, SH reviewed  Outpatient Encounter Medications as of 02/09/2021  Medication Sig Note   atenolol (TENORMIN) 25 MG tablet Take 1 tablet (25 mg total) by mouth daily.    Calcium Carbonate (CALTRATE 600 PO) Take 1 tablet by mouth daily.    fexofenadine (ALLEGRA) 180 MG tablet Take 180 mg by mouth daily.    GLUCOSAMINE PO Take 2,000 mg by mouth 2 (two) times daily.    levothyroxine (SYNTHROID) 25 MCG tablet Take 1 tablet (25 mcg total) by mouth daily.    losartan-hydrochlorothiazide (HYZAAR) 100-25 MG tablet Take 1 tablet by mouth daily.    Multiple Vitamins-Minerals (CENTRUM SILVER PO) Take 1 tablet by mouth daily.    Multiple Vitamins-Minerals (PRESERVISION/LUTEIN) CAPS Take 1 capsule by mouth 2 (two) times daily.    OXYGEN Inhale into the lungs. 02/09/2021: 1-2L continously all day.    simvastatin (ZOCOR) 20 MG tablet Take 1 tablet (  20 mg total) by mouth at bedtime.    [DISCONTINUED] Cholecalciferol (VITAMIN D-3) 25 MCG (1000 UT) CAPS Take 1,000 Units by mouth daily.    acetaminophen (TYLENOL) 500 MG tablet Take 1,000 mg by mouth every 6 (six) hours as needed. (Patient not taking: Reported on 02/09/2021) 02/09/2021: Takes as needed, has not had any in a couple of weeks   fluticasone (FLONASE) 50 MCG/ACT nasal spray Place into both nostrils daily. (Patient not taking: Reported on 02/09/2021) 02/09/2021: Has not been using, was not given while at hospital   [DISCONTINUED] guaiFENesin-dextromethorphan (ROBITUSSIN  DM) 100-10 MG/5ML syrup Take 5 mLs by mouth every 6 (six) hours as needed for cough.    No facility-administered encounter medications on file as of 02/09/2021.   Allergies  Allergen Reactions   Percocet [Oxycodone-Acetaminophen] Other (See Comments)    Upset stomach   ROS: no known fever, chills, headaches, dizziness, chest pain. 14# weight loss noted. Appetite is starting to improve. Fatigue--gradually improving, but know she will be wiped out when she gets home today No changes to hair, skin, bowels No urinary complaints--sometimes if not quick enough, has slight leakage. No dysuria, hematuria, odor. Back pain from fall has resolved.   PHYSICAL EXAM:  BP 110/60   Pulse 84   Temp 98.5 F (36.9 C) (Tympanic)   Ht _0  (1.626 m)   Wt 125 lb (56.7 kg)   BMI 21.46 kg/m   Wt Readings from Last 3 Encounters:  02/09/21 125 lb (56.7 kg)  01/26/21 135 lb 1.6 oz (61.3 kg)  01/19/21 139 lb 3.2 oz (63.1 kg)   Elderly female in wheelchair.  In good spirits, in no distress. She is speaking easily and comfortably. Occasional throat-clearing or cough  during visit. HEENT: conjunctiva and sclera are clear, EOMI, wearing mask Neck: no lymphadenopathy, thyromegaly or mass Heart: regular rate and rhythm Lungs: clear bilaterally.  Slight ronchi noted at base at first, but cleared with deep breaths Abdomen: soft, nontender, no mass Extremities: no edema.  Laceration on pinkie has completely healed.  No rashes or bruising noted (full skin exam not performed). Psych: normal mood, affect, hygiene and grooming Neuro: alert and oriented.  Walked well with walker  Pulse ox--resting 96%.  After lap around office, 91% and came up to 95% within 15 seconds.  Urine dip: SG 1.015, trace leuks, neg nitrite, blood, protein.  Glu noted    ASSESSMENT/PLAN:   History of recent pneumonia - completed ABX prior to discharge. +Legionella. Pt slowly improving.  recheck CXR next week - Plan: CBC with  Differential/Platelet, DG Chest 2 View  Elevated LFTs - unclear etiology.  Due for recheck - Plan: Comprehensive metabolic panel, POCT Urinalysis DIP (Proadvantage Device)  Cough, unspecified type - overall improved. has some PND and h/o allergies. She may resume Flonase to treat allergies - Plan: CBC with Differential/Platelet, DG Chest 2 View  Protein-calorie malnutrition, unspecified severity (HCC) - significant weight loss related to this illness, with drop in total protein and albumin. Recheck. Clinically is getting stronger, improved appetite - Plan: Comprehensive metabolic panel, POCT Urinalysis DIP (Proadvantage Device)  Cbc, c-met Urine dip CXR next week

## 2021-02-09 NOTE — Patient Instructions (Addendum)
Need for oxygen should decrease as your lungs are healing. We will be checking your pulse ox with walking in the office. This is something that home health also usually assesses, to held determine if there is an ongoing need for oxygen.  If you're having leakage of urine, consider wearing a pad that can be changed more easily than changing wet underwear.  Going more frequently, rather than waiting until you feel the urge will likely help cut back on leakage/accidents.  I would like for you to get a repeat chest x-ray next week (I'm not sure of the holiday hours--you can call them.  If you can't go earlier in the week, you can go the week after). Go to either [address] [place]. The order is in the computer.  You don't need an appointment (but verify that they are open due to some possible changes with the holiday).

## 2021-02-10 LAB — CBC WITH DIFFERENTIAL/PLATELET
Basophils Absolute: 0 10*3/uL (ref 0.0–0.2)
Basos: 0 %
EOS (ABSOLUTE): 0.1 10*3/uL (ref 0.0–0.4)
Eos: 1 %
Hematocrit: 38.6 % (ref 34.0–46.6)
Hemoglobin: 12.7 g/dL (ref 11.1–15.9)
Immature Grans (Abs): 0 10*3/uL (ref 0.0–0.1)
Immature Granulocytes: 0 %
Lymphocytes Absolute: 0.5 10*3/uL — ABNORMAL LOW (ref 0.7–3.1)
Lymphs: 6 %
MCH: 28.6 pg (ref 26.6–33.0)
MCHC: 32.9 g/dL (ref 31.5–35.7)
MCV: 87 fL (ref 79–97)
Monocytes Absolute: 1.2 10*3/uL — ABNORMAL HIGH (ref 0.1–0.9)
Monocytes: 13 %
Neutrophils Absolute: 7.1 10*3/uL — ABNORMAL HIGH (ref 1.4–7.0)
Neutrophils: 80 %
Platelets: 245 10*3/uL (ref 150–450)
RBC: 4.44 x10E6/uL (ref 3.77–5.28)
RDW: 11.8 % (ref 11.7–15.4)
WBC: 8.9 10*3/uL (ref 3.4–10.8)

## 2021-02-10 LAB — COMPREHENSIVE METABOLIC PANEL
ALT: 51 IU/L — ABNORMAL HIGH (ref 0–32)
AST: 37 IU/L (ref 0–40)
Albumin/Globulin Ratio: 2.3 — ABNORMAL HIGH (ref 1.2–2.2)
Albumin: 3.7 g/dL (ref 3.6–4.6)
Alkaline Phosphatase: 129 IU/L — ABNORMAL HIGH (ref 44–121)
BUN/Creatinine Ratio: 26 (ref 12–28)
BUN: 15 mg/dL (ref 8–27)
Bilirubin Total: 0.8 mg/dL (ref 0.0–1.2)
CO2: 27 mmol/L (ref 20–29)
Calcium: 9.5 mg/dL (ref 8.7–10.3)
Chloride: 96 mmol/L (ref 96–106)
Creatinine, Ser: 0.58 mg/dL (ref 0.57–1.00)
Globulin, Total: 1.6 g/dL (ref 1.5–4.5)
Glucose: 144 mg/dL — ABNORMAL HIGH (ref 70–99)
Potassium: 3.7 mmol/L (ref 3.5–5.2)
Sodium: 138 mmol/L (ref 134–144)
Total Protein: 5.3 g/dL — ABNORMAL LOW (ref 6.0–8.5)
eGFR: 89 mL/min/{1.73_m2} (ref >59)

## 2021-02-13 ENCOUNTER — Telehealth: Payer: Self-pay | Admitting: Family Medicine

## 2021-02-13 DIAGNOSIS — I700 Atherosclerosis of aorta: Secondary | ICD-10-CM | POA: Diagnosis not present

## 2021-02-13 DIAGNOSIS — Z9181 History of falling: Secondary | ICD-10-CM | POA: Diagnosis not present

## 2021-02-13 DIAGNOSIS — K5790 Diverticulosis of intestine, part unspecified, without perforation or abscess without bleeding: Secondary | ICD-10-CM | POA: Diagnosis not present

## 2021-02-13 DIAGNOSIS — E039 Hypothyroidism, unspecified: Secondary | ICD-10-CM | POA: Diagnosis not present

## 2021-02-13 DIAGNOSIS — S066X0D Traumatic subarachnoid hemorrhage without loss of consciousness, subsequent encounter: Secondary | ICD-10-CM | POA: Diagnosis not present

## 2021-02-13 DIAGNOSIS — H3530 Unspecified macular degeneration: Secondary | ICD-10-CM | POA: Diagnosis not present

## 2021-02-13 DIAGNOSIS — R296 Repeated falls: Secondary | ICD-10-CM | POA: Diagnosis not present

## 2021-02-13 DIAGNOSIS — J9601 Acute respiratory failure with hypoxia: Secondary | ICD-10-CM | POA: Diagnosis not present

## 2021-02-13 DIAGNOSIS — M47817 Spondylosis without myelopathy or radiculopathy, lumbosacral region: Secondary | ICD-10-CM | POA: Diagnosis not present

## 2021-02-13 DIAGNOSIS — Z87440 Personal history of urinary (tract) infections: Secondary | ICD-10-CM | POA: Diagnosis not present

## 2021-02-13 DIAGNOSIS — I10 Essential (primary) hypertension: Secondary | ICD-10-CM | POA: Diagnosis not present

## 2021-02-13 DIAGNOSIS — M47818 Spondylosis without myelopathy or radiculopathy, sacral and sacrococcygeal region: Secondary | ICD-10-CM | POA: Diagnosis not present

## 2021-02-13 DIAGNOSIS — Z9981 Dependence on supplemental oxygen: Secondary | ICD-10-CM | POA: Diagnosis not present

## 2021-02-13 DIAGNOSIS — M6281 Muscle weakness (generalized): Secondary | ICD-10-CM | POA: Diagnosis not present

## 2021-02-13 DIAGNOSIS — K589 Irritable bowel syndrome without diarrhea: Secondary | ICD-10-CM | POA: Diagnosis not present

## 2021-02-13 DIAGNOSIS — E782 Mixed hyperlipidemia: Secondary | ICD-10-CM | POA: Diagnosis not present

## 2021-02-13 DIAGNOSIS — J9811 Atelectasis: Secondary | ICD-10-CM | POA: Diagnosis not present

## 2021-02-13 DIAGNOSIS — M47816 Spondylosis without myelopathy or radiculopathy, lumbar region: Secondary | ICD-10-CM | POA: Diagnosis not present

## 2021-02-13 NOTE — Telephone Encounter (Signed)
Advise pt--when seated, keep legs elevated and/or where compression socks. Getting up and walking around, which uses the muscles, may also help decrease swelling. Be sure that she isn't getting too much salt in her diet (canned foods, soups, etc). If she has any chest pain, or shortness of breath, please let us know.  She is due to get her repeat chest x-ray this week, which will let us know if there appears to be any fluid overload (related to heart/lungs) as well as a follow-up for the pneumonia.

## 2021-02-13 NOTE — Telephone Encounter (Signed)
Patient advised.

## 2021-02-13 NOTE — Telephone Encounter (Signed)
Pts son in law Arvella Nigh called and said both of pts ankles have been swelling up. They are wondering if there is anything else she needs to be doing to minimize swelling

## 2021-02-14 ENCOUNTER — Ambulatory Visit
Admission: RE | Admit: 2021-02-14 | Discharge: 2021-02-14 | Disposition: A | Discharge status: Home / Self Care | Payer: Medicare Other | Source: Ambulatory Visit | Attending: Family Medicine | Admitting: Family Medicine

## 2021-02-14 ENCOUNTER — Encounter: Payer: Self-pay | Admitting: Family Medicine

## 2021-02-14 DIAGNOSIS — R059 Cough, unspecified: Secondary | ICD-10-CM

## 2021-02-14 DIAGNOSIS — Z8701 Personal history of pneumonia (recurrent): Secondary | ICD-10-CM

## 2021-02-14 DIAGNOSIS — I700 Atherosclerosis of aorta: Secondary | ICD-10-CM | POA: Insufficient documentation

## 2021-02-15 DIAGNOSIS — E039 Hypothyroidism, unspecified: Secondary | ICD-10-CM | POA: Diagnosis not present

## 2021-02-15 DIAGNOSIS — K5790 Diverticulosis of intestine, part unspecified, without perforation or abscess without bleeding: Secondary | ICD-10-CM | POA: Diagnosis not present

## 2021-02-15 DIAGNOSIS — Z87440 Personal history of urinary (tract) infections: Secondary | ICD-10-CM | POA: Diagnosis not present

## 2021-02-15 DIAGNOSIS — I10 Essential (primary) hypertension: Secondary | ICD-10-CM | POA: Diagnosis not present

## 2021-02-15 DIAGNOSIS — S066X0D Traumatic subarachnoid hemorrhage without loss of consciousness, subsequent encounter: Secondary | ICD-10-CM | POA: Diagnosis not present

## 2021-02-15 DIAGNOSIS — I700 Atherosclerosis of aorta: Secondary | ICD-10-CM | POA: Diagnosis not present

## 2021-02-15 DIAGNOSIS — M47818 Spondylosis without myelopathy or radiculopathy, sacral and sacrococcygeal region: Secondary | ICD-10-CM | POA: Diagnosis not present

## 2021-02-15 DIAGNOSIS — M47817 Spondylosis without myelopathy or radiculopathy, lumbosacral region: Secondary | ICD-10-CM | POA: Diagnosis not present

## 2021-02-15 DIAGNOSIS — Z9181 History of falling: Secondary | ICD-10-CM | POA: Diagnosis not present

## 2021-02-15 DIAGNOSIS — R296 Repeated falls: Secondary | ICD-10-CM | POA: Diagnosis not present

## 2021-02-15 DIAGNOSIS — J9811 Atelectasis: Secondary | ICD-10-CM | POA: Diagnosis not present

## 2021-02-15 DIAGNOSIS — M6281 Muscle weakness (generalized): Secondary | ICD-10-CM | POA: Diagnosis not present

## 2021-02-15 DIAGNOSIS — Z9981 Dependence on supplemental oxygen: Secondary | ICD-10-CM | POA: Diagnosis not present

## 2021-02-15 DIAGNOSIS — M47816 Spondylosis without myelopathy or radiculopathy, lumbar region: Secondary | ICD-10-CM | POA: Diagnosis not present

## 2021-02-15 DIAGNOSIS — K589 Irritable bowel syndrome without diarrhea: Secondary | ICD-10-CM | POA: Diagnosis not present

## 2021-02-15 DIAGNOSIS — E782 Mixed hyperlipidemia: Secondary | ICD-10-CM | POA: Diagnosis not present

## 2021-02-15 DIAGNOSIS — J9601 Acute respiratory failure with hypoxia: Secondary | ICD-10-CM | POA: Diagnosis not present

## 2021-02-15 DIAGNOSIS — H3530 Unspecified macular degeneration: Secondary | ICD-10-CM | POA: Diagnosis not present

## 2021-02-20 DIAGNOSIS — H3530 Unspecified macular degeneration: Secondary | ICD-10-CM | POA: Diagnosis not present

## 2021-02-20 DIAGNOSIS — K589 Irritable bowel syndrome without diarrhea: Secondary | ICD-10-CM | POA: Diagnosis not present

## 2021-02-20 DIAGNOSIS — M47816 Spondylosis without myelopathy or radiculopathy, lumbar region: Secondary | ICD-10-CM | POA: Diagnosis not present

## 2021-02-20 DIAGNOSIS — R296 Repeated falls: Secondary | ICD-10-CM | POA: Diagnosis not present

## 2021-02-20 DIAGNOSIS — M47817 Spondylosis without myelopathy or radiculopathy, lumbosacral region: Secondary | ICD-10-CM | POA: Diagnosis not present

## 2021-02-20 DIAGNOSIS — Z9981 Dependence on supplemental oxygen: Secondary | ICD-10-CM | POA: Diagnosis not present

## 2021-02-20 DIAGNOSIS — J9811 Atelectasis: Secondary | ICD-10-CM | POA: Diagnosis not present

## 2021-02-20 DIAGNOSIS — I10 Essential (primary) hypertension: Secondary | ICD-10-CM | POA: Diagnosis not present

## 2021-02-20 DIAGNOSIS — S066X0D Traumatic subarachnoid hemorrhage without loss of consciousness, subsequent encounter: Secondary | ICD-10-CM | POA: Diagnosis not present

## 2021-02-20 DIAGNOSIS — E782 Mixed hyperlipidemia: Secondary | ICD-10-CM | POA: Diagnosis not present

## 2021-02-20 DIAGNOSIS — Z9181 History of falling: Secondary | ICD-10-CM | POA: Diagnosis not present

## 2021-02-20 DIAGNOSIS — M6281 Muscle weakness (generalized): Secondary | ICD-10-CM | POA: Diagnosis not present

## 2021-02-20 DIAGNOSIS — Z87440 Personal history of urinary (tract) infections: Secondary | ICD-10-CM | POA: Diagnosis not present

## 2021-02-20 DIAGNOSIS — K5790 Diverticulosis of intestine, part unspecified, without perforation or abscess without bleeding: Secondary | ICD-10-CM | POA: Diagnosis not present

## 2021-02-20 DIAGNOSIS — J9601 Acute respiratory failure with hypoxia: Secondary | ICD-10-CM | POA: Diagnosis not present

## 2021-02-20 DIAGNOSIS — I700 Atherosclerosis of aorta: Secondary | ICD-10-CM | POA: Diagnosis not present

## 2021-02-20 DIAGNOSIS — E039 Hypothyroidism, unspecified: Secondary | ICD-10-CM | POA: Diagnosis not present

## 2021-02-20 DIAGNOSIS — M47818 Spondylosis without myelopathy or radiculopathy, sacral and sacrococcygeal region: Secondary | ICD-10-CM | POA: Diagnosis not present

## 2021-02-21 DIAGNOSIS — S066X0D Traumatic subarachnoid hemorrhage without loss of consciousness, subsequent encounter: Secondary | ICD-10-CM | POA: Diagnosis not present

## 2021-02-21 DIAGNOSIS — Z9181 History of falling: Secondary | ICD-10-CM | POA: Diagnosis not present

## 2021-02-21 DIAGNOSIS — R296 Repeated falls: Secondary | ICD-10-CM | POA: Diagnosis not present

## 2021-02-21 DIAGNOSIS — K589 Irritable bowel syndrome without diarrhea: Secondary | ICD-10-CM | POA: Diagnosis not present

## 2021-02-21 DIAGNOSIS — Z9981 Dependence on supplemental oxygen: Secondary | ICD-10-CM | POA: Diagnosis not present

## 2021-02-21 DIAGNOSIS — E039 Hypothyroidism, unspecified: Secondary | ICD-10-CM | POA: Diagnosis not present

## 2021-02-21 DIAGNOSIS — H3530 Unspecified macular degeneration: Secondary | ICD-10-CM | POA: Diagnosis not present

## 2021-02-21 DIAGNOSIS — M47818 Spondylosis without myelopathy or radiculopathy, sacral and sacrococcygeal region: Secondary | ICD-10-CM | POA: Diagnosis not present

## 2021-02-21 DIAGNOSIS — J9601 Acute respiratory failure with hypoxia: Secondary | ICD-10-CM | POA: Diagnosis not present

## 2021-02-21 DIAGNOSIS — M6281 Muscle weakness (generalized): Secondary | ICD-10-CM | POA: Diagnosis not present

## 2021-02-21 DIAGNOSIS — K5790 Diverticulosis of intestine, part unspecified, without perforation or abscess without bleeding: Secondary | ICD-10-CM | POA: Diagnosis not present

## 2021-02-21 DIAGNOSIS — M47816 Spondylosis without myelopathy or radiculopathy, lumbar region: Secondary | ICD-10-CM | POA: Diagnosis not present

## 2021-02-21 DIAGNOSIS — M47817 Spondylosis without myelopathy or radiculopathy, lumbosacral region: Secondary | ICD-10-CM | POA: Diagnosis not present

## 2021-02-21 DIAGNOSIS — Z87440 Personal history of urinary (tract) infections: Secondary | ICD-10-CM | POA: Diagnosis not present

## 2021-02-21 DIAGNOSIS — J9811 Atelectasis: Secondary | ICD-10-CM | POA: Diagnosis not present

## 2021-02-21 DIAGNOSIS — E782 Mixed hyperlipidemia: Secondary | ICD-10-CM | POA: Diagnosis not present

## 2021-02-21 DIAGNOSIS — I10 Essential (primary) hypertension: Secondary | ICD-10-CM | POA: Diagnosis not present

## 2021-02-21 DIAGNOSIS — I700 Atherosclerosis of aorta: Secondary | ICD-10-CM | POA: Diagnosis not present

## 2021-02-23 DIAGNOSIS — K5790 Diverticulosis of intestine, part unspecified, without perforation or abscess without bleeding: Secondary | ICD-10-CM | POA: Diagnosis not present

## 2021-02-23 DIAGNOSIS — E782 Mixed hyperlipidemia: Secondary | ICD-10-CM | POA: Diagnosis not present

## 2021-02-23 DIAGNOSIS — M47818 Spondylosis without myelopathy or radiculopathy, sacral and sacrococcygeal region: Secondary | ICD-10-CM | POA: Diagnosis not present

## 2021-02-23 DIAGNOSIS — Z9981 Dependence on supplemental oxygen: Secondary | ICD-10-CM | POA: Diagnosis not present

## 2021-02-23 DIAGNOSIS — H3530 Unspecified macular degeneration: Secondary | ICD-10-CM | POA: Diagnosis not present

## 2021-02-23 DIAGNOSIS — J9601 Acute respiratory failure with hypoxia: Secondary | ICD-10-CM | POA: Diagnosis not present

## 2021-02-23 DIAGNOSIS — Z9181 History of falling: Secondary | ICD-10-CM | POA: Diagnosis not present

## 2021-02-23 DIAGNOSIS — M47817 Spondylosis without myelopathy or radiculopathy, lumbosacral region: Secondary | ICD-10-CM | POA: Diagnosis not present

## 2021-02-23 DIAGNOSIS — J9811 Atelectasis: Secondary | ICD-10-CM | POA: Diagnosis not present

## 2021-02-23 DIAGNOSIS — I700 Atherosclerosis of aorta: Secondary | ICD-10-CM | POA: Diagnosis not present

## 2021-02-23 DIAGNOSIS — I10 Essential (primary) hypertension: Secondary | ICD-10-CM | POA: Diagnosis not present

## 2021-02-23 DIAGNOSIS — M47816 Spondylosis without myelopathy or radiculopathy, lumbar region: Secondary | ICD-10-CM | POA: Diagnosis not present

## 2021-02-23 DIAGNOSIS — M6281 Muscle weakness (generalized): Secondary | ICD-10-CM | POA: Diagnosis not present

## 2021-02-23 DIAGNOSIS — Z87440 Personal history of urinary (tract) infections: Secondary | ICD-10-CM | POA: Diagnosis not present

## 2021-02-23 DIAGNOSIS — K589 Irritable bowel syndrome without diarrhea: Secondary | ICD-10-CM | POA: Diagnosis not present

## 2021-02-23 DIAGNOSIS — R296 Repeated falls: Secondary | ICD-10-CM | POA: Diagnosis not present

## 2021-02-23 DIAGNOSIS — E039 Hypothyroidism, unspecified: Secondary | ICD-10-CM | POA: Diagnosis not present

## 2021-02-23 DIAGNOSIS — S066X0D Traumatic subarachnoid hemorrhage without loss of consciousness, subsequent encounter: Secondary | ICD-10-CM | POA: Diagnosis not present

## 2021-02-28 DIAGNOSIS — I10 Essential (primary) hypertension: Secondary | ICD-10-CM | POA: Diagnosis not present

## 2021-02-28 DIAGNOSIS — S066X0D Traumatic subarachnoid hemorrhage without loss of consciousness, subsequent encounter: Secondary | ICD-10-CM | POA: Diagnosis not present

## 2021-02-28 DIAGNOSIS — E782 Mixed hyperlipidemia: Secondary | ICD-10-CM | POA: Diagnosis not present

## 2021-02-28 DIAGNOSIS — J9811 Atelectasis: Secondary | ICD-10-CM | POA: Diagnosis not present

## 2021-02-28 DIAGNOSIS — J9601 Acute respiratory failure with hypoxia: Secondary | ICD-10-CM | POA: Diagnosis not present

## 2021-02-28 DIAGNOSIS — E039 Hypothyroidism, unspecified: Secondary | ICD-10-CM | POA: Diagnosis not present

## 2021-02-28 DIAGNOSIS — M6281 Muscle weakness (generalized): Secondary | ICD-10-CM | POA: Diagnosis not present

## 2021-02-28 DIAGNOSIS — M47817 Spondylosis without myelopathy or radiculopathy, lumbosacral region: Secondary | ICD-10-CM | POA: Diagnosis not present

## 2021-02-28 DIAGNOSIS — R296 Repeated falls: Secondary | ICD-10-CM | POA: Diagnosis not present

## 2021-02-28 DIAGNOSIS — Z87440 Personal history of urinary (tract) infections: Secondary | ICD-10-CM | POA: Diagnosis not present

## 2021-02-28 DIAGNOSIS — K5790 Diverticulosis of intestine, part unspecified, without perforation or abscess without bleeding: Secondary | ICD-10-CM | POA: Diagnosis not present

## 2021-02-28 DIAGNOSIS — M47818 Spondylosis without myelopathy or radiculopathy, sacral and sacrococcygeal region: Secondary | ICD-10-CM | POA: Diagnosis not present

## 2021-02-28 DIAGNOSIS — Z9181 History of falling: Secondary | ICD-10-CM | POA: Diagnosis not present

## 2021-02-28 DIAGNOSIS — I700 Atherosclerosis of aorta: Secondary | ICD-10-CM | POA: Diagnosis not present

## 2021-02-28 DIAGNOSIS — K589 Irritable bowel syndrome without diarrhea: Secondary | ICD-10-CM | POA: Diagnosis not present

## 2021-02-28 DIAGNOSIS — Z9981 Dependence on supplemental oxygen: Secondary | ICD-10-CM | POA: Diagnosis not present

## 2021-02-28 DIAGNOSIS — M47816 Spondylosis without myelopathy or radiculopathy, lumbar region: Secondary | ICD-10-CM | POA: Diagnosis not present

## 2021-02-28 DIAGNOSIS — H3530 Unspecified macular degeneration: Secondary | ICD-10-CM | POA: Diagnosis not present

## 2021-03-01 DIAGNOSIS — K5790 Diverticulosis of intestine, part unspecified, without perforation or abscess without bleeding: Secondary | ICD-10-CM | POA: Diagnosis not present

## 2021-03-01 DIAGNOSIS — M47818 Spondylosis without myelopathy or radiculopathy, sacral and sacrococcygeal region: Secondary | ICD-10-CM | POA: Diagnosis not present

## 2021-03-01 DIAGNOSIS — R296 Repeated falls: Secondary | ICD-10-CM | POA: Diagnosis not present

## 2021-03-01 DIAGNOSIS — E039 Hypothyroidism, unspecified: Secondary | ICD-10-CM | POA: Diagnosis not present

## 2021-03-01 DIAGNOSIS — S066X0D Traumatic subarachnoid hemorrhage without loss of consciousness, subsequent encounter: Secondary | ICD-10-CM | POA: Diagnosis not present

## 2021-03-01 DIAGNOSIS — M47816 Spondylosis without myelopathy or radiculopathy, lumbar region: Secondary | ICD-10-CM | POA: Diagnosis not present

## 2021-03-01 DIAGNOSIS — I10 Essential (primary) hypertension: Secondary | ICD-10-CM | POA: Diagnosis not present

## 2021-03-01 DIAGNOSIS — Z9181 History of falling: Secondary | ICD-10-CM | POA: Diagnosis not present

## 2021-03-01 DIAGNOSIS — I700 Atherosclerosis of aorta: Secondary | ICD-10-CM | POA: Diagnosis not present

## 2021-03-01 DIAGNOSIS — M6281 Muscle weakness (generalized): Secondary | ICD-10-CM | POA: Diagnosis not present

## 2021-03-01 DIAGNOSIS — K589 Irritable bowel syndrome without diarrhea: Secondary | ICD-10-CM | POA: Diagnosis not present

## 2021-03-01 DIAGNOSIS — H3530 Unspecified macular degeneration: Secondary | ICD-10-CM | POA: Diagnosis not present

## 2021-03-01 DIAGNOSIS — M47817 Spondylosis without myelopathy or radiculopathy, lumbosacral region: Secondary | ICD-10-CM | POA: Diagnosis not present

## 2021-03-01 DIAGNOSIS — E782 Mixed hyperlipidemia: Secondary | ICD-10-CM | POA: Diagnosis not present

## 2021-03-01 DIAGNOSIS — J9811 Atelectasis: Secondary | ICD-10-CM | POA: Diagnosis not present

## 2021-03-01 DIAGNOSIS — J9601 Acute respiratory failure with hypoxia: Secondary | ICD-10-CM | POA: Diagnosis not present

## 2021-03-01 DIAGNOSIS — Z87440 Personal history of urinary (tract) infections: Secondary | ICD-10-CM | POA: Diagnosis not present

## 2021-03-01 DIAGNOSIS — Z9981 Dependence on supplemental oxygen: Secondary | ICD-10-CM | POA: Diagnosis not present

## 2021-03-02 DIAGNOSIS — M6281 Muscle weakness (generalized): Secondary | ICD-10-CM | POA: Diagnosis not present

## 2021-03-02 DIAGNOSIS — J9811 Atelectasis: Secondary | ICD-10-CM | POA: Diagnosis not present

## 2021-03-02 DIAGNOSIS — H3530 Unspecified macular degeneration: Secondary | ICD-10-CM | POA: Diagnosis not present

## 2021-03-02 DIAGNOSIS — Z9181 History of falling: Secondary | ICD-10-CM | POA: Diagnosis not present

## 2021-03-02 DIAGNOSIS — R296 Repeated falls: Secondary | ICD-10-CM | POA: Diagnosis not present

## 2021-03-02 DIAGNOSIS — I10 Essential (primary) hypertension: Secondary | ICD-10-CM | POA: Diagnosis not present

## 2021-03-02 DIAGNOSIS — J9601 Acute respiratory failure with hypoxia: Secondary | ICD-10-CM | POA: Diagnosis not present

## 2021-03-02 DIAGNOSIS — K5790 Diverticulosis of intestine, part unspecified, without perforation or abscess without bleeding: Secondary | ICD-10-CM | POA: Diagnosis not present

## 2021-03-02 DIAGNOSIS — Z87440 Personal history of urinary (tract) infections: Secondary | ICD-10-CM | POA: Diagnosis not present

## 2021-03-02 DIAGNOSIS — Z9981 Dependence on supplemental oxygen: Secondary | ICD-10-CM | POA: Diagnosis not present

## 2021-03-02 DIAGNOSIS — E039 Hypothyroidism, unspecified: Secondary | ICD-10-CM | POA: Diagnosis not present

## 2021-03-02 DIAGNOSIS — S066X0D Traumatic subarachnoid hemorrhage without loss of consciousness, subsequent encounter: Secondary | ICD-10-CM | POA: Diagnosis not present

## 2021-03-02 DIAGNOSIS — K589 Irritable bowel syndrome without diarrhea: Secondary | ICD-10-CM | POA: Diagnosis not present

## 2021-03-02 DIAGNOSIS — M47818 Spondylosis without myelopathy or radiculopathy, sacral and sacrococcygeal region: Secondary | ICD-10-CM | POA: Diagnosis not present

## 2021-03-02 DIAGNOSIS — M47816 Spondylosis without myelopathy or radiculopathy, lumbar region: Secondary | ICD-10-CM | POA: Diagnosis not present

## 2021-03-02 DIAGNOSIS — I700 Atherosclerosis of aorta: Secondary | ICD-10-CM | POA: Diagnosis not present

## 2021-03-02 DIAGNOSIS — E782 Mixed hyperlipidemia: Secondary | ICD-10-CM | POA: Diagnosis not present

## 2021-03-02 DIAGNOSIS — M47817 Spondylosis without myelopathy or radiculopathy, lumbosacral region: Secondary | ICD-10-CM | POA: Diagnosis not present

## 2021-03-07 DIAGNOSIS — E039 Hypothyroidism, unspecified: Secondary | ICD-10-CM | POA: Diagnosis not present

## 2021-03-07 DIAGNOSIS — Z9181 History of falling: Secondary | ICD-10-CM | POA: Diagnosis not present

## 2021-03-07 DIAGNOSIS — S066X0D Traumatic subarachnoid hemorrhage without loss of consciousness, subsequent encounter: Secondary | ICD-10-CM | POA: Diagnosis not present

## 2021-03-07 DIAGNOSIS — E782 Mixed hyperlipidemia: Secondary | ICD-10-CM | POA: Diagnosis not present

## 2021-03-07 DIAGNOSIS — R296 Repeated falls: Secondary | ICD-10-CM | POA: Diagnosis not present

## 2021-03-07 DIAGNOSIS — Z87440 Personal history of urinary (tract) infections: Secondary | ICD-10-CM | POA: Diagnosis not present

## 2021-03-07 DIAGNOSIS — J9601 Acute respiratory failure with hypoxia: Secondary | ICD-10-CM | POA: Diagnosis not present

## 2021-03-07 DIAGNOSIS — H3530 Unspecified macular degeneration: Secondary | ICD-10-CM | POA: Diagnosis not present

## 2021-03-07 DIAGNOSIS — K5790 Diverticulosis of intestine, part unspecified, without perforation or abscess without bleeding: Secondary | ICD-10-CM | POA: Diagnosis not present

## 2021-03-07 DIAGNOSIS — M6281 Muscle weakness (generalized): Secondary | ICD-10-CM | POA: Diagnosis not present

## 2021-03-07 DIAGNOSIS — M47818 Spondylosis without myelopathy or radiculopathy, sacral and sacrococcygeal region: Secondary | ICD-10-CM | POA: Diagnosis not present

## 2021-03-07 DIAGNOSIS — K589 Irritable bowel syndrome without diarrhea: Secondary | ICD-10-CM | POA: Diagnosis not present

## 2021-03-07 DIAGNOSIS — M47816 Spondylosis without myelopathy or radiculopathy, lumbar region: Secondary | ICD-10-CM | POA: Diagnosis not present

## 2021-03-07 DIAGNOSIS — I700 Atherosclerosis of aorta: Secondary | ICD-10-CM | POA: Diagnosis not present

## 2021-03-07 DIAGNOSIS — I10 Essential (primary) hypertension: Secondary | ICD-10-CM | POA: Diagnosis not present

## 2021-03-07 DIAGNOSIS — M47817 Spondylosis without myelopathy or radiculopathy, lumbosacral region: Secondary | ICD-10-CM | POA: Diagnosis not present

## 2021-03-07 DIAGNOSIS — J9811 Atelectasis: Secondary | ICD-10-CM | POA: Diagnosis not present

## 2021-03-07 DIAGNOSIS — Z9981 Dependence on supplemental oxygen: Secondary | ICD-10-CM | POA: Diagnosis not present

## 2021-03-09 ENCOUNTER — Telehealth: Payer: Self-pay | Admitting: Family Medicine

## 2021-03-09 DIAGNOSIS — D3132 Benign neoplasm of left choroid: Secondary | ICD-10-CM | POA: Diagnosis not present

## 2021-03-09 DIAGNOSIS — H353112 Nonexudative age-related macular degeneration, right eye, intermediate dry stage: Secondary | ICD-10-CM | POA: Diagnosis not present

## 2021-03-09 DIAGNOSIS — H353221 Exudative age-related macular degeneration, left eye, with active choroidal neovascularization: Secondary | ICD-10-CM | POA: Diagnosis not present

## 2021-03-09 DIAGNOSIS — Z961 Presence of intraocular lens: Secondary | ICD-10-CM | POA: Diagnosis not present

## 2021-03-09 NOTE — Telephone Encounter (Signed)
Shanon Brow a Physical Therapist with [facility] called. He is requesting  additional visits for the following:  1 time a week for 2 weeks.   Please advise Shanon Brow at [phone].

## 2021-03-09 NOTE — Telephone Encounter (Signed)
I thought I signed a fax okaying to extend PT.   Okay to give them the verbal order

## 2021-03-10 NOTE — Telephone Encounter (Signed)
Home health notified.

## 2021-03-14 DIAGNOSIS — E782 Mixed hyperlipidemia: Secondary | ICD-10-CM | POA: Diagnosis not present

## 2021-03-14 DIAGNOSIS — M6281 Muscle weakness (generalized): Secondary | ICD-10-CM | POA: Diagnosis not present

## 2021-03-14 DIAGNOSIS — M47818 Spondylosis without myelopathy or radiculopathy, sacral and sacrococcygeal region: Secondary | ICD-10-CM | POA: Diagnosis not present

## 2021-03-14 DIAGNOSIS — E039 Hypothyroidism, unspecified: Secondary | ICD-10-CM | POA: Diagnosis not present

## 2021-03-14 DIAGNOSIS — Z9981 Dependence on supplemental oxygen: Secondary | ICD-10-CM | POA: Diagnosis not present

## 2021-03-14 DIAGNOSIS — S066X0D Traumatic subarachnoid hemorrhage without loss of consciousness, subsequent encounter: Secondary | ICD-10-CM | POA: Diagnosis not present

## 2021-03-14 DIAGNOSIS — Z9181 History of falling: Secondary | ICD-10-CM | POA: Diagnosis not present

## 2021-03-14 DIAGNOSIS — J9811 Atelectasis: Secondary | ICD-10-CM | POA: Diagnosis not present

## 2021-03-14 DIAGNOSIS — K5790 Diverticulosis of intestine, part unspecified, without perforation or abscess without bleeding: Secondary | ICD-10-CM | POA: Diagnosis not present

## 2021-03-14 DIAGNOSIS — I700 Atherosclerosis of aorta: Secondary | ICD-10-CM | POA: Diagnosis not present

## 2021-03-14 DIAGNOSIS — Z87440 Personal history of urinary (tract) infections: Secondary | ICD-10-CM | POA: Diagnosis not present

## 2021-03-14 DIAGNOSIS — K589 Irritable bowel syndrome without diarrhea: Secondary | ICD-10-CM | POA: Diagnosis not present

## 2021-03-14 DIAGNOSIS — J9601 Acute respiratory failure with hypoxia: Secondary | ICD-10-CM | POA: Diagnosis not present

## 2021-03-14 DIAGNOSIS — H3530 Unspecified macular degeneration: Secondary | ICD-10-CM | POA: Diagnosis not present

## 2021-03-14 DIAGNOSIS — R296 Repeated falls: Secondary | ICD-10-CM | POA: Diagnosis not present

## 2021-03-14 DIAGNOSIS — M47817 Spondylosis without myelopathy or radiculopathy, lumbosacral region: Secondary | ICD-10-CM | POA: Diagnosis not present

## 2021-03-14 DIAGNOSIS — I10 Essential (primary) hypertension: Secondary | ICD-10-CM | POA: Diagnosis not present

## 2021-03-14 DIAGNOSIS — M47816 Spondylosis without myelopathy or radiculopathy, lumbar region: Secondary | ICD-10-CM | POA: Diagnosis not present

## 2021-03-16 ENCOUNTER — Other Ambulatory Visit: Payer: Self-pay | Admitting: Family Medicine

## 2021-03-16 DIAGNOSIS — Z1231 Encounter for screening mammogram for malignant neoplasm of breast: Secondary | ICD-10-CM

## 2021-04-07 DIAGNOSIS — M25651 Stiffness of right hip, not elsewhere classified: Secondary | ICD-10-CM | POA: Diagnosis not present

## 2021-04-07 DIAGNOSIS — M5187 Other intervertebral disc disorders, lumbosacral region: Secondary | ICD-10-CM | POA: Diagnosis not present

## 2021-04-07 DIAGNOSIS — M4807 Spinal stenosis, lumbosacral region: Secondary | ICD-10-CM | POA: Diagnosis not present

## 2021-04-07 DIAGNOSIS — R2689 Other abnormalities of gait and mobility: Secondary | ICD-10-CM | POA: Diagnosis not present

## 2021-04-07 DIAGNOSIS — M25652 Stiffness of left hip, not elsewhere classified: Secondary | ICD-10-CM | POA: Diagnosis not present

## 2021-04-10 DIAGNOSIS — M5187 Other intervertebral disc disorders, lumbosacral region: Secondary | ICD-10-CM | POA: Diagnosis not present

## 2021-04-10 DIAGNOSIS — R2689 Other abnormalities of gait and mobility: Secondary | ICD-10-CM | POA: Diagnosis not present

## 2021-04-10 DIAGNOSIS — M25651 Stiffness of right hip, not elsewhere classified: Secondary | ICD-10-CM | POA: Diagnosis not present

## 2021-04-10 DIAGNOSIS — M4807 Spinal stenosis, lumbosacral region: Secondary | ICD-10-CM | POA: Diagnosis not present

## 2021-04-10 DIAGNOSIS — M25652 Stiffness of left hip, not elsewhere classified: Secondary | ICD-10-CM | POA: Diagnosis not present

## 2021-04-13 DIAGNOSIS — R2689 Other abnormalities of gait and mobility: Secondary | ICD-10-CM | POA: Diagnosis not present

## 2021-04-13 DIAGNOSIS — M5187 Other intervertebral disc disorders, lumbosacral region: Secondary | ICD-10-CM | POA: Diagnosis not present

## 2021-04-13 DIAGNOSIS — M4807 Spinal stenosis, lumbosacral region: Secondary | ICD-10-CM | POA: Diagnosis not present

## 2021-04-13 DIAGNOSIS — M25652 Stiffness of left hip, not elsewhere classified: Secondary | ICD-10-CM | POA: Diagnosis not present

## 2021-04-13 DIAGNOSIS — M25651 Stiffness of right hip, not elsewhere classified: Secondary | ICD-10-CM | POA: Diagnosis not present

## 2021-04-14 ENCOUNTER — Ambulatory Visit
Admission: RE | Admit: 2021-04-14 | Discharge: 2021-04-14 | Disposition: A | Discharge status: Home / Self Care | Payer: Medicare Other | Source: Ambulatory Visit | Attending: Family Medicine | Admitting: Family Medicine

## 2021-04-14 DIAGNOSIS — Z1231 Encounter for screening mammogram for malignant neoplasm of breast: Secondary | ICD-10-CM

## 2021-04-18 DIAGNOSIS — M4807 Spinal stenosis, lumbosacral region: Secondary | ICD-10-CM | POA: Diagnosis not present

## 2021-04-18 DIAGNOSIS — M5187 Other intervertebral disc disorders, lumbosacral region: Secondary | ICD-10-CM | POA: Diagnosis not present

## 2021-04-18 DIAGNOSIS — M25652 Stiffness of left hip, not elsewhere classified: Secondary | ICD-10-CM | POA: Diagnosis not present

## 2021-04-18 DIAGNOSIS — R2689 Other abnormalities of gait and mobility: Secondary | ICD-10-CM | POA: Diagnosis not present

## 2021-04-18 DIAGNOSIS — M25651 Stiffness of right hip, not elsewhere classified: Secondary | ICD-10-CM | POA: Diagnosis not present

## 2021-04-24 ENCOUNTER — Telehealth: Payer: Self-pay

## 2021-04-24 ENCOUNTER — Encounter: Payer: Self-pay | Admitting: Family Medicine

## 2021-04-24 ENCOUNTER — Ambulatory Visit (INDEPENDENT_AMBULATORY_CARE_PROVIDER_SITE_OTHER): Payer: Medicare Other | Admitting: Family Medicine

## 2021-04-24 VITALS — BP 125/76 | HR 82 | Temp 97.8°F | Ht 64.0 in | Wt 122.0 lb

## 2021-04-24 DIAGNOSIS — R0989 Other specified symptoms and signs involving the circulatory and respiratory systems: Secondary | ICD-10-CM

## 2021-04-24 DIAGNOSIS — M5187 Other intervertebral disc disorders, lumbosacral region: Secondary | ICD-10-CM | POA: Diagnosis not present

## 2021-04-24 DIAGNOSIS — M25652 Stiffness of left hip, not elsewhere classified: Secondary | ICD-10-CM | POA: Diagnosis not present

## 2021-04-24 DIAGNOSIS — M4807 Spinal stenosis, lumbosacral region: Secondary | ICD-10-CM | POA: Diagnosis not present

## 2021-04-24 DIAGNOSIS — R2689 Other abnormalities of gait and mobility: Secondary | ICD-10-CM | POA: Diagnosis not present

## 2021-04-24 DIAGNOSIS — M25651 Stiffness of right hip, not elsewhere classified: Secondary | ICD-10-CM | POA: Diagnosis not present

## 2021-04-24 NOTE — Patient Instructions (Signed)
Stay well hydrated. Continue your allergy medications (allegra and flonase). Add the robitussin DM as needed for cough and congestion. The guaifenesin in this syrup should help loosen up any thick phlegm, so less gets caught up in your throat or sinuses, and hopefully prevent it from moving down into your chest.  Contact us if you develop fever, shortness of breath, chest pain, pain with breathing, or if the mucus or phlegm is more consistently discolored.  I encourage you to pick up some unexpired COVID tests from the pharmacy (should be covered by medicare).  Test if your symptoms persist or worsen.

## 2021-04-24 NOTE — Telephone Encounter (Signed)
Patient called to report cough and congestion. I offered virtual visit she replied " I dont do those" She wanted to speak to [place] I informed she is seeing patients right now but would send a message

## 2021-04-24 NOTE — Telephone Encounter (Signed)
Called patient and scheduled her for telephone appt this afternoon as she cannot do a virtual.

## 2021-04-24 NOTE — Progress Notes (Signed)
Start time: 2:43 End time: 2:58  Virtual Visit via Telephone Note  I connected with Courtney Castro on 04/24/21 by telephone and verified that I am speaking with the correct person using two identifiers. Patient reported inability to do video visit.  Location: Patient: home Provider: office   I discussed the limitations of evaluation and management by telemedicine and the availability of in person appointments. The patient expressed understanding and agreed to proceed.  History of Present Illness:  Chief Complaint  Patient presents with   Cough    PHONE CALL cough and congestion x 1 week. Comes and goes. Sleeps through the night, except to get up to use bathroom. No fever, chills or body aches. Has not taken any home covid tests. Mucus is mostly white and sometimes yellow/green. Cough is loose, and doesn't start until she is up 3-4 hours.    She has had intermittent drippy nose over the last week.  Mucus is white. She also has developed a cough, more like clearing her throat, cough isn't deep in chest.  When she tries to clear her throat, she will get up white phlegm.  She has noticed a touch of yellow and green in the last couple of days, but not consistent.  She denies headaches, sinus pain.  Denies sore throat, ear pain or plugging.  No shortness of breath or chest pain, no pain with breathing.  She takes flonase and Human resources officer daily. She bought. Robitussin cough/chest/congestion DM, but hasn't taken any yet.   No sick contacts. She goes to the gym ([employer], [employer]).  She completely recovered from pneumonia in November. Stopped caretakers at home after her daughter Courtney Castro visited from [place] at Thanksgiving.   PMH, PSH, SH reviewed  Outpatient Encounter Medications as of 04/24/2021  Medication Sig Note   atenolol (TENORMIN) 25 MG tablet Take 1 tablet (25 mg total) by mouth daily.    Calcium Carbonate (CALTRATE 600 PO) Take 1 tablet by mouth daily.    fexofenadine (ALLEGRA)  180 MG tablet Take 180 mg by mouth daily.    fluticasone (FLONASE) 50 MCG/ACT nasal spray Place into both nostrils daily.    GLUCOSAMINE PO Take 2,000 mg by mouth 2 (two) times daily.    levothyroxine (SYNTHROID) 25 MCG tablet Take 1 tablet (25 mcg total) by mouth daily.    losartan-hydrochlorothiazide (HYZAAR) 100-25 MG tablet Take 1 tablet by mouth daily.    Multiple Vitamins-Minerals (CENTRUM SILVER PO) Take 1 tablet by mouth daily.    Multiple Vitamins-Minerals (PRESERVISION/LUTEIN) CAPS Take 1 capsule by mouth 2 (two) times daily.    simvastatin (ZOCOR) 20 MG tablet Take 1 tablet (20 mg total) by mouth at bedtime.    acetaminophen (TYLENOL) 500 MG tablet Take 1,000 mg by mouth every 6 (six) hours as needed. (Patient not taking: Reported on 02/09/2021) 04/24/2021: As needed.   [DISCONTINUED] OXYGEN Inhale into the lungs. 02/09/2021: 1-2L continously all day.    No facility-administered encounter medications on file as of 04/24/2021.   Allergies  Allergen Reactions   Percocet [Oxycodone-Acetaminophen] Other (See Comments)    Upset stomach   ROS: No f/c, n/v/d, headaches, myalgias, no change in taste/smell, no rashes. No bleeding/bruising. She has been taking Align probiotic, which has helped with her "erratic" bowels.    Observations/Objective:  BP 125/76    Pulse 82    Temp 97.8 F (36.6 C) (Oral)    Ht 5\' 4"  (1.626 m)    Wt 122 lb (55.3 kg)  BMI 20.94 kg/m   Patient is alert, oriented, normal speech, in no distress. Exam is limited due to virtual nature of the visit. There was no throat-clearing or coughing noted during her visit.   Assessment and Plan:  Upper respiratory symptom - no e/o infection at this time. Reviewed supportive measures and s/sx for which she should seek re-eval   Stay well hydrated. Continue your allergy medications (allegra and flonase). Add the robitussin DM as needed for cough and congestion. The guaifenesin in this syrup should help loosen up  any thick phlegm, so less gets caught up in your throat or sinuses, and hopefully prevent it from moving down into your chest.  Contact us if you develop fever, shortness of breath, chest pain, pain with breathing, or if the mucus or phlegm is more consistently discolored.  I encourage you to pick up some unexpired COVID tests from the pharmacy (should be covered by medicare).  Test if your symptoms persist or worsen.   Follow Up Instructions:    I discussed the assessment and treatment plan with the patient. The patient was provided an opportunity to ask questions and all were answered. The patient agreed with the plan and demonstrated an understanding of the instructions.   The patient was advised to call back or seek an in-person evaluation if the symptoms worsen or if the condition fails to improve as anticipated.  I spent 17 minutes dedicated to the care of this patient, including pre-visit review of records, face to face time, post-visit ordering of testing and documentation.    Vikki Ports, MD

## 2021-04-25 NOTE — Progress Notes (Signed)
AVS mailed to pt.

## 2021-04-27 ENCOUNTER — Telehealth: Payer: Self-pay

## 2021-04-27 DIAGNOSIS — M25652 Stiffness of left hip, not elsewhere classified: Secondary | ICD-10-CM | POA: Diagnosis not present

## 2021-04-27 DIAGNOSIS — M4807 Spinal stenosis, lumbosacral region: Secondary | ICD-10-CM | POA: Diagnosis not present

## 2021-04-27 DIAGNOSIS — M5187 Other intervertebral disc disorders, lumbosacral region: Secondary | ICD-10-CM | POA: Diagnosis not present

## 2021-04-27 DIAGNOSIS — M25651 Stiffness of right hip, not elsewhere classified: Secondary | ICD-10-CM | POA: Diagnosis not present

## 2021-04-27 DIAGNOSIS — R2689 Other abnormalities of gait and mobility: Secondary | ICD-10-CM | POA: Diagnosis not present

## 2021-04-27 NOTE — Telephone Encounter (Signed)
Patient advised.

## 2021-04-27 NOTE — Telephone Encounter (Signed)
Pt. Was told to call back to let you know how she is doing. She stated she is getting better, coughs up a little bit of phlegm for time to time and sometimes it has a little bit of green in it. She stated most of the time it is clear though. She said if you need to call her back she will be at home all day.

## 2021-04-27 NOTE — Telephone Encounter (Signed)
Sending to you for your review.

## 2021-04-27 NOTE — Telephone Encounter (Signed)
Sounds like she isn't getting worse, is a little better.  Please give her a call and thank her for the update.  No need to reach back out unless there are changes/worsening.

## 2021-05-01 DIAGNOSIS — R2689 Other abnormalities of gait and mobility: Secondary | ICD-10-CM | POA: Diagnosis not present

## 2021-05-01 DIAGNOSIS — M4807 Spinal stenosis, lumbosacral region: Secondary | ICD-10-CM | POA: Diagnosis not present

## 2021-05-01 DIAGNOSIS — M25651 Stiffness of right hip, not elsewhere classified: Secondary | ICD-10-CM | POA: Diagnosis not present

## 2021-05-01 DIAGNOSIS — M25652 Stiffness of left hip, not elsewhere classified: Secondary | ICD-10-CM | POA: Diagnosis not present

## 2021-05-01 DIAGNOSIS — M5187 Other intervertebral disc disorders, lumbosacral region: Secondary | ICD-10-CM | POA: Diagnosis not present

## 2021-05-03 DIAGNOSIS — M25651 Stiffness of right hip, not elsewhere classified: Secondary | ICD-10-CM | POA: Diagnosis not present

## 2021-05-03 DIAGNOSIS — M25652 Stiffness of left hip, not elsewhere classified: Secondary | ICD-10-CM | POA: Diagnosis not present

## 2021-05-03 DIAGNOSIS — R2689 Other abnormalities of gait and mobility: Secondary | ICD-10-CM | POA: Diagnosis not present

## 2021-05-03 DIAGNOSIS — M5187 Other intervertebral disc disorders, lumbosacral region: Secondary | ICD-10-CM | POA: Diagnosis not present

## 2021-05-03 DIAGNOSIS — M4807 Spinal stenosis, lumbosacral region: Secondary | ICD-10-CM | POA: Diagnosis not present

## 2021-05-09 DIAGNOSIS — M5187 Other intervertebral disc disorders, lumbosacral region: Secondary | ICD-10-CM | POA: Diagnosis not present

## 2021-05-09 DIAGNOSIS — M4807 Spinal stenosis, lumbosacral region: Secondary | ICD-10-CM | POA: Diagnosis not present

## 2021-05-09 DIAGNOSIS — M25652 Stiffness of left hip, not elsewhere classified: Secondary | ICD-10-CM | POA: Diagnosis not present

## 2021-05-09 DIAGNOSIS — R2689 Other abnormalities of gait and mobility: Secondary | ICD-10-CM | POA: Diagnosis not present

## 2021-05-09 DIAGNOSIS — M25651 Stiffness of right hip, not elsewhere classified: Secondary | ICD-10-CM | POA: Diagnosis not present

## 2021-05-11 DIAGNOSIS — M25651 Stiffness of right hip, not elsewhere classified: Secondary | ICD-10-CM | POA: Diagnosis not present

## 2021-05-11 DIAGNOSIS — M4807 Spinal stenosis, lumbosacral region: Secondary | ICD-10-CM | POA: Diagnosis not present

## 2021-05-11 DIAGNOSIS — M5187 Other intervertebral disc disorders, lumbosacral region: Secondary | ICD-10-CM | POA: Diagnosis not present

## 2021-05-11 DIAGNOSIS — M25652 Stiffness of left hip, not elsewhere classified: Secondary | ICD-10-CM | POA: Diagnosis not present

## 2021-05-11 DIAGNOSIS — R2689 Other abnormalities of gait and mobility: Secondary | ICD-10-CM | POA: Diagnosis not present

## 2021-05-16 DIAGNOSIS — M4807 Spinal stenosis, lumbosacral region: Secondary | ICD-10-CM | POA: Diagnosis not present

## 2021-05-16 DIAGNOSIS — M25651 Stiffness of right hip, not elsewhere classified: Secondary | ICD-10-CM | POA: Diagnosis not present

## 2021-05-16 DIAGNOSIS — R2689 Other abnormalities of gait and mobility: Secondary | ICD-10-CM | POA: Diagnosis not present

## 2021-05-16 DIAGNOSIS — M25652 Stiffness of left hip, not elsewhere classified: Secondary | ICD-10-CM | POA: Diagnosis not present

## 2021-05-16 DIAGNOSIS — M5187 Other intervertebral disc disorders, lumbosacral region: Secondary | ICD-10-CM | POA: Diagnosis not present

## 2021-05-19 DIAGNOSIS — M25651 Stiffness of right hip, not elsewhere classified: Secondary | ICD-10-CM | POA: Diagnosis not present

## 2021-05-19 DIAGNOSIS — M25652 Stiffness of left hip, not elsewhere classified: Secondary | ICD-10-CM | POA: Diagnosis not present

## 2021-05-19 DIAGNOSIS — M5187 Other intervertebral disc disorders, lumbosacral region: Secondary | ICD-10-CM | POA: Diagnosis not present

## 2021-05-19 DIAGNOSIS — M4807 Spinal stenosis, lumbosacral region: Secondary | ICD-10-CM | POA: Diagnosis not present

## 2021-05-19 DIAGNOSIS — R2689 Other abnormalities of gait and mobility: Secondary | ICD-10-CM | POA: Diagnosis not present

## 2021-05-22 DIAGNOSIS — M25652 Stiffness of left hip, not elsewhere classified: Secondary | ICD-10-CM | POA: Diagnosis not present

## 2021-05-22 DIAGNOSIS — M25651 Stiffness of right hip, not elsewhere classified: Secondary | ICD-10-CM | POA: Diagnosis not present

## 2021-05-22 DIAGNOSIS — M4807 Spinal stenosis, lumbosacral region: Secondary | ICD-10-CM | POA: Diagnosis not present

## 2021-05-22 DIAGNOSIS — R2689 Other abnormalities of gait and mobility: Secondary | ICD-10-CM | POA: Diagnosis not present

## 2021-05-22 DIAGNOSIS — M5187 Other intervertebral disc disorders, lumbosacral region: Secondary | ICD-10-CM | POA: Diagnosis not present

## 2021-05-25 DIAGNOSIS — M25652 Stiffness of left hip, not elsewhere classified: Secondary | ICD-10-CM | POA: Diagnosis not present

## 2021-05-25 DIAGNOSIS — R2689 Other abnormalities of gait and mobility: Secondary | ICD-10-CM | POA: Diagnosis not present

## 2021-05-25 DIAGNOSIS — M5187 Other intervertebral disc disorders, lumbosacral region: Secondary | ICD-10-CM | POA: Diagnosis not present

## 2021-05-25 DIAGNOSIS — M4807 Spinal stenosis, lumbosacral region: Secondary | ICD-10-CM | POA: Diagnosis not present

## 2021-05-25 DIAGNOSIS — M25651 Stiffness of right hip, not elsewhere classified: Secondary | ICD-10-CM | POA: Diagnosis not present

## 2021-05-31 DIAGNOSIS — M5187 Other intervertebral disc disorders, lumbosacral region: Secondary | ICD-10-CM | POA: Diagnosis not present

## 2021-05-31 DIAGNOSIS — M25651 Stiffness of right hip, not elsewhere classified: Secondary | ICD-10-CM | POA: Diagnosis not present

## 2021-05-31 DIAGNOSIS — R2689 Other abnormalities of gait and mobility: Secondary | ICD-10-CM | POA: Diagnosis not present

## 2021-05-31 DIAGNOSIS — M4807 Spinal stenosis, lumbosacral region: Secondary | ICD-10-CM | POA: Diagnosis not present

## 2021-05-31 DIAGNOSIS — M25652 Stiffness of left hip, not elsewhere classified: Secondary | ICD-10-CM | POA: Diagnosis not present

## 2021-06-02 DIAGNOSIS — R2689 Other abnormalities of gait and mobility: Secondary | ICD-10-CM | POA: Diagnosis not present

## 2021-06-02 DIAGNOSIS — M25651 Stiffness of right hip, not elsewhere classified: Secondary | ICD-10-CM | POA: Diagnosis not present

## 2021-06-02 DIAGNOSIS — M5187 Other intervertebral disc disorders, lumbosacral region: Secondary | ICD-10-CM | POA: Diagnosis not present

## 2021-06-02 DIAGNOSIS — M25652 Stiffness of left hip, not elsewhere classified: Secondary | ICD-10-CM | POA: Diagnosis not present

## 2021-06-02 DIAGNOSIS — M4807 Spinal stenosis, lumbosacral region: Secondary | ICD-10-CM | POA: Diagnosis not present

## 2021-06-06 DIAGNOSIS — M4807 Spinal stenosis, lumbosacral region: Secondary | ICD-10-CM | POA: Diagnosis not present

## 2021-06-06 DIAGNOSIS — M25652 Stiffness of left hip, not elsewhere classified: Secondary | ICD-10-CM | POA: Diagnosis not present

## 2021-06-06 DIAGNOSIS — R2689 Other abnormalities of gait and mobility: Secondary | ICD-10-CM | POA: Diagnosis not present

## 2021-06-06 DIAGNOSIS — M25651 Stiffness of right hip, not elsewhere classified: Secondary | ICD-10-CM | POA: Diagnosis not present

## 2021-06-06 DIAGNOSIS — M5187 Other intervertebral disc disorders, lumbosacral region: Secondary | ICD-10-CM | POA: Diagnosis not present

## 2021-06-07 NOTE — Progress Notes (Addendum)
Chief Complaint  ?Patient presents with  ? Medicare Wellness  ?  Fasting AWV/CPE. Had UTI in Oct/Nov and pneumonia. Wants to give a urine today, will give a urine on way out (could not give). Not having symptoms not and she said she was not then either. FIT test given to patient.   ? ? ?Courtney Castro is a 88 y.o. female who presents for annual physical exam, Medicare wellness visit and follow-up on chronic medical conditions.   ? ?Hypertension.  Compliant with losartan-HCT 100-25 and atenolol 82m, denies side effects. ?She denies HA, dizziness, chest pain, muscle cramps, edema. ?BP's aren't checked elsewhere. ?  ?Hyperlipidemia follow-up:  Patient is reportedly following a low-fat, low cholesterol diet.  Compliant with medications (simvastatin 242m and denies medication side effects ?Lipids were at goal on last check, due for recheck.  She also had aortic atherosclerosis noted on CT 01/2021. ?Lab Results  ?Component Value Date  ? CHOL 170 05/30/2020  ? HDL 66 05/30/2020  ? LDKanosh9 05/30/2020  ? TRIG 81 05/30/2020  ? CHOLHDL 2.6 05/30/2020  ? ? ?Hypothyroidism: She reports compliance with taking medication on empty stomach. She denies changes to hair/skin/nail, energy/weight/moods.  She noticed some hair loss in Jan/Feb, but recently this has been improving (thinks related to her illness/hospitalization in November). She has long h/o IBS, no significant bowel changes. Align helps her bowels. ?Lab Results  ?Component Value Date  ? TSH 1.250 11/30/2020  ? ?H/o impaired fasting glucose: She tries to limit her sweets and has small portions. Sugars have been normal the last few years. ?Lab Results  ?Component Value Date  ? HGBA1C 5.7 (H) 01/20/2021  ? ? ?Allergies: year-round, controlled with flonase and allegra daily.  She had a slight illness in January, which resolved.  Currently doing well, mucus is clear. ?  ?Exudative macular degeneration--sees Dr. GrCordelia Penegularly, has been spacing those visits out.  Last  seen 02/2021.  Hasn't needed any injections in 2 years (always in L eye).  She hasn't noticed significant change in vision. Not driving much at night. ?  ?Grief--she continues to see [facility] every 2 weeks. She recently got together with her daughters and grandchildren for the anniversary of Jack's death, scattered his ashes and celebrated him. She is doing fine except when it comes to things like taxes, still hasn't completely settled his estate. [facility] Hasn't gotten back to church much.  She plays canasta monthly with her neighborhood friends.  She reports today she feels like she is moving on. ? ?History of R breast cancer, s/p lumpectomy and radiation.  She last had mammo in 03/2021. Biopsy in 2018 showed fat necrosis with dense fibrosis (benign). 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 Normally colonoscopy would be recommended every 5 years, but due to age, this isn't being done.  Per visit with Dr. MaWatt Climesn May, 2021, recommendation is to check stool for blood. ?  ?Immunization History  ?Administered Date(s) Administered  ? Fluad Quad(high Dose 65+) 11/20/2018, 12/03/2019, 11/30/2020  ? Influenza Split 12/11/2009, 01/02/2011, 12/21/2011  ? Influenza, High Dose Seasonal PF 12/05/2012, 12/17/2013, 12/13/2014, 12/22/2015, 01/01/2017, 12/04/2017  ? PFIZER Comirnaty(Gray Top)Covid-19 Tri-Sucrose Vaccine 10/13/2020  ? PFIZER(Purple Top)SARS-COV-2 Vaccination 04/17/2019, 05/07/2019, 01/05/2020  ? PfPension scheme manager255yr up 12/22/2020  ? Pneumococcal Conjugate-13 03/04/2014  ? Pneumococcal Polysaccharide-23 11/07/2001, 01/31/2011, 05/30/2020  ? Td 12/18/2004  ? Tdap 01/31/2011, 03/22/2017  ?  Zoster Recombinat (Shingrix) 07/10/2016, 10/18/2016  ? Zoster, Live 08/25/2007  ? ?Last Pap smear: 2011, s/p hysterectomy for benign reasons   ?Last mammogram: 03/2021 ?Last colonoscopy:  June 2014 with Dr. Watt Climes. Negative FOB  08/2019 (Dr. Watt Climes), negative FIT testing 06/2020 here ?Last DEXA: 05/2016 normal ?Dentist: twice yearly   ?Ophtho: yearly; sees Dr. Cordelia Pen more often for injections ?Exercise:  She goes to [facility] 2x/week.  Working on balance.  She uses bands.  Not getting much cardio there. Plans to start walking in her neighborhood ?  ?Patient Care Team: ?Rita Ohara, MD as PCP - General (Family Medicine) ?Ophtho: Dr. Cordelia Pen (retinal specialist), at [facility]. Dr. Satira Sark ?Dentist: Dr. Irineo Axon ?GI: Dr. Watt Climes ?Derm: Dr. Derrel Nip ?Podiatry: Dr. Fritzi Mandes (no longer sees) ?Ortho: Dr. Wynelle Link, Dr. Rhona Raider (no longer sees) ?Neurosurg: Dr. Weston Settle, Dr. Vertell Limber (no longer sees) ?  ? ?Depression Screening: ?[place] Office Visit from 06/08/2021 in [place]  ?PHQ-2 Total Score 0  ? ?  ?  ?Falls screen:  ?Fall Risk  06/08/2021 01/16/2021 11/30/2020 05/30/2020 05/28/2019  ?Falls in the past year? 1 1 0 0 1  ?Number falls in past yr: 1 0 0 - 0  ?Comment - recent fall - - -  ?Injury with Fall? 1 0 0 - (No Data)  ?Comment fell at spa in Oct. [place] again after that at home. (went to sit down on toilet at home). just bruising with both - - - bruise on her knee  ?Risk for fall due to : History of fall(s) History of fall(s) No Fall Risks - -  ?Follow up Falls evaluation completed Falls evaluation completed Falls evaluation completed - -  ?  ? ?Functional Status Survey: ?Is the patient deaf or have difficulty hearing?: No ?Does the patient have difficulty seeing, even when wearing glasses/contacts?: Yes (just reading seems to be difficult) ?Does the patient have difficulty concentrating, remembering, or making decisions?: Yes (some words or places that she has been to years ago) ?Does the patient have difficulty walking or climbing stairs?: No ?Does the patient have difficulty dressing or bathing?: No ?Does the patient have difficulty doing errands alone such as visiting a doctor's office or shopping?: No ?No major memory issues, just little things,  mainly names (street names). ? ?Mini-Cog Scoring: 5  ? ?  ?End of Life Discussion:  Has living will and healthcare power of attorney forms. These are from 2007, and scanned into chart in 12/2019.  Husband was listed, but daughters also listed as [place] POA.  ? ? ?PMH, PSH, SH and FH were reviewed and updated ? ?Outpatient Encounter Medications as of 06/08/2021  ?Medication Sig Note  ? atenolol (TENORMIN) 25 MG tablet Take 1 tablet (25 mg total) by mouth daily.   ? Calcium Carbonate (CALTRATE 600 PO) Take 1 tablet by mouth daily.   ? fexofenadine (ALLEGRA) 180 MG tablet Take 180 mg by mouth daily.   ? fluticasone (FLONASE) 50 MCG/ACT nasal spray Place into both nostrils daily.   ? GLUCOSAMINE PO Take 2,000 mg by mouth 2 (two) times daily.   ? levothyroxine (SYNTHROID) 25 MCG tablet Take 1 tablet (25 mcg total) by mouth daily.   ? losartan-hydrochlorothiazide (HYZAAR) 100-25 MG tablet Take 1 tablet by mouth daily.   ? Multiple Vitamins-Minerals (CENTRUM SILVER PO) Take 1 tablet by mouth daily.   ? Multiple Vitamins-Minerals (PRESERVISION/LUTEIN) CAPS Take 1 capsule by mouth 2 (two) times daily.   ? Probiotic Product (ALIGN PO) Take 1 capsule by  mouth daily.   ? simvastatin (ZOCOR) 20 MG tablet Take 1 tablet (20 mg total) by mouth at bedtime.   ? acetaminophen (TYLENOL) 500 MG tablet Take 1,000 mg by mouth every 6 (six) hours as needed. (Patient not taking: Reported on 02/09/2021) 04/24/2021: As needed.  ? ?No facility-administered encounter medications on file as of 06/08/2021.  ? ?Allergies  ?Allergen Reactions  ? Percocet [Oxycodone-Acetaminophen] Other (See Comments)  ?  Upset stomach  ? ? ? ?ROS: The patient denies anorexia, fever, weight changes, headaches, vision changes (stable), decreased hearing, ear pain, sore throat, breast concerns, chest pain, palpitations, dizziness, syncope, dyspnea on exertion, swelling, nausea, vomiting, diarrhea, constipation, abdominal pain, melena, hematochezia, indigestion/heartburn,  hematuria, vaginal bleeding, genital lesions, joint pains, numbness, tingling, weakness, tremor, suspicious skin lesions, depression, anxiety, abnormal bleeding/bruising, or enlarged lymph nodes.   ?She den

## 2021-06-07 NOTE — Patient Instructions (Addendum)
?  HEALTH MAINTENANCE RECOMMENDATIONS: ? ?It is recommended that you get at least 30 minutes of aerobic exercise at least 5 days/week (for weight loss, you may need as much as 60-90 minutes). This can be any activity that gets your heart rate up. This can be divided in 10-15 minute intervals if needed, but try and build up your endurance at least once a week.  Weight bearing exercise is also recommended twice weekly. ? ?Eat a healthy diet with lots of vegetables, fruits and fiber.  "Colorful" foods have a lot of vitamins (ie green vegetables, tomatoes, red peppers, etc).  Limit sweet tea, regular sodas and alcoholic beverages, all of which has a lot of calories and sugar.  Up to 1 alcoholic drink daily may be beneficial for women (unless trying to lose weight, watch sugars).  Drink a lot of water. ? ?Calcium recommendations are 1200-1500 mg daily (1500 mg for postmenopausal women or women without ovaries), and vitamin D 1000 IU daily.  This should be obtained from diet and/or supplements (vitamins), and calcium should not be taken all at once, but in divided doses. ? ?Monthly self breast exams and yearly mammograms for women over the age of 10 is recommended. ? ?Sunscreen of at least SPF 30 should be used on all sun-exposed parts of the skin when outside between the hours of 10 am and 4 pm (not just when at beach or pool, but even with exercise, golf, tennis, and yard work!)  Use a sunscreen that says "broad spectrum" so it covers both UVA and UVB rays, and make sure to reapply every 1-2 hours. ? ?Remember to change the batteries in your smoke detectors when changing your clock times in the spring and fall. Carbon monoxide detectors are recommended for your home. ? ?Use your seat belt every time you are in a car, and please drive safely and not be distracted with cell phones and texting while driving. ? ? ?Courtney Castro , ?Thank you for taking time to come for your Medicare Wellness Visit. I appreciate your ongoing  commitment to your health goals. Please review the following plan we discussed and let me know if I can assist you in the future.  ? ?This is a list of the screening recommended for you and due dates:  ?Health Maintenance  ?Topic Date Due  ? Tetanus Vaccine  03/23/2027  ? Pneumonia Vaccine  Completed  ? Flu Shot  Completed  ? DEXA scan (bone density measurement)  Completed  ? COVID-19 Vaccine  Completed  ? Zoster (Shingles) Vaccine  Completed  ? HPV Vaccine  Aged Out  ? ?Consider seeing [place] to help you with some of your callouses on your feet. ? ? ?

## 2021-06-08 ENCOUNTER — Ambulatory Visit (INDEPENDENT_AMBULATORY_CARE_PROVIDER_SITE_OTHER): Payer: Medicare Other | Admitting: Family Medicine

## 2021-06-08 ENCOUNTER — Encounter: Payer: Self-pay | Admitting: Family Medicine

## 2021-06-08 VITALS — BP 132/80 | HR 68 | Ht 63.75 in | Wt 131.8 lb

## 2021-06-08 DIAGNOSIS — L659 Nonscarring hair loss, unspecified: Secondary | ICD-10-CM | POA: Diagnosis not present

## 2021-06-08 DIAGNOSIS — Z87440 Personal history of urinary (tract) infections: Secondary | ICD-10-CM

## 2021-06-08 DIAGNOSIS — J309 Allergic rhinitis, unspecified: Secondary | ICD-10-CM

## 2021-06-08 DIAGNOSIS — E782 Mixed hyperlipidemia: Secondary | ICD-10-CM | POA: Diagnosis not present

## 2021-06-08 DIAGNOSIS — H353221 Exudative age-related macular degeneration, left eye, with active choroidal neovascularization: Secondary | ICD-10-CM

## 2021-06-08 DIAGNOSIS — Z5181 Encounter for therapeutic drug level monitoring: Secondary | ICD-10-CM

## 2021-06-08 DIAGNOSIS — E039 Hypothyroidism, unspecified: Secondary | ICD-10-CM | POA: Diagnosis not present

## 2021-06-08 DIAGNOSIS — I700 Atherosclerosis of aorta: Secondary | ICD-10-CM

## 2021-06-08 DIAGNOSIS — I10 Essential (primary) hypertension: Secondary | ICD-10-CM | POA: Diagnosis not present

## 2021-06-08 DIAGNOSIS — Z0000 Encounter for general adult medical examination without abnormal findings: Secondary | ICD-10-CM

## 2021-06-08 DIAGNOSIS — R8290 Unspecified abnormal findings in urine: Secondary | ICD-10-CM | POA: Diagnosis not present

## 2021-06-08 LAB — POCT URINALYSIS DIP (PROADVANTAGE DEVICE)
Bilirubin, UA: NEGATIVE
Blood, UA: NEGATIVE
Glucose, UA: NEGATIVE mg/dL
Ketones, POC UA: NEGATIVE mg/dL
Nitrite, UA: POSITIVE — AB
Protein Ur, POC: NEGATIVE mg/dL
Specific Gravity, Urine: 1.02
Urobilinogen, Ur: 0.2
pH, UA: 6 (ref 5.0–8.0)

## 2021-06-08 MED ORDER — ATENOLOL 25 MG PO TABS: 25.0000 mg | ORAL_TABLET | Freq: Every day | ORAL | 1 refills | Status: DC

## 2021-06-08 MED ORDER — LOSARTAN POTASSIUM-HCTZ 100-25 MG PO TABS: 1.0000 | ORAL_TABLET | Freq: Every day | ORAL | 1 refills | Status: DC

## 2021-06-09 DIAGNOSIS — R2689 Other abnormalities of gait and mobility: Secondary | ICD-10-CM | POA: Diagnosis not present

## 2021-06-09 DIAGNOSIS — M25652 Stiffness of left hip, not elsewhere classified: Secondary | ICD-10-CM | POA: Diagnosis not present

## 2021-06-09 DIAGNOSIS — M5187 Other intervertebral disc disorders, lumbosacral region: Secondary | ICD-10-CM | POA: Diagnosis not present

## 2021-06-09 DIAGNOSIS — M4807 Spinal stenosis, lumbosacral region: Secondary | ICD-10-CM | POA: Diagnosis not present

## 2021-06-09 DIAGNOSIS — M25651 Stiffness of right hip, not elsewhere classified: Secondary | ICD-10-CM | POA: Diagnosis not present

## 2021-06-09 LAB — TSH: TSH: 1.55 u[IU]/mL (ref 0.450–4.500)

## 2021-06-09 LAB — LIPID PANEL
Chol/HDL Ratio: 2.4 ratio (ref 0.0–4.4)
Cholesterol, Total: 175 mg/dL (ref 100–199)
HDL: 74 mg/dL (ref >39)
LDL Chol Calc (NIH): 85 mg/dL (ref 0–99)
Triglycerides: 91 mg/dL (ref 0–149)
VLDL Cholesterol Cal: 16 mg/dL (ref 5–40)

## 2021-06-09 LAB — CBC WITH DIFFERENTIAL/PLATELET
Basophils Absolute: 0.1 10*3/uL (ref 0.0–0.2)
Basos: 1 %
EOS (ABSOLUTE): 0.1 10*3/uL (ref 0.0–0.4)
Eos: 1 %
Hematocrit: 50.1 % — ABNORMAL HIGH (ref 34.0–46.6)
Hemoglobin: 16.3 g/dL — ABNORMAL HIGH (ref 11.1–15.9)
Immature Grans (Abs): 0 10*3/uL (ref 0.0–0.1)
Immature Granulocytes: 0 %
Lymphocytes Absolute: 1.1 10*3/uL (ref 0.7–3.1)
Lymphs: 12 %
MCH: 28.8 pg (ref 26.6–33.0)
MCHC: 32.5 g/dL (ref 31.5–35.7)
MCV: 89 fL (ref 79–97)
Monocytes Absolute: 1.1 10*3/uL — ABNORMAL HIGH (ref 0.1–0.9)
Monocytes: 12 %
Neutrophils Absolute: 6.4 10*3/uL (ref 1.4–7.0)
Neutrophils: 74 %
Platelets: 241 10*3/uL (ref 150–450)
RBC: 5.65 x10E6/uL — ABNORMAL HIGH (ref 3.77–5.28)
RDW: 12 % (ref 11.7–15.4)
WBC: 8.8 10*3/uL (ref 3.4–10.8)

## 2021-06-09 LAB — COMPREHENSIVE METABOLIC PANEL
ALT: 22 IU/L (ref 0–32)
AST: 30 IU/L (ref 0–40)
Albumin/Globulin Ratio: 3.3 — ABNORMAL HIGH (ref 1.2–2.2)
Albumin: 4.6 g/dL (ref 3.6–4.6)
Alkaline Phosphatase: 104 IU/L (ref 44–121)
BUN/Creatinine Ratio: 20 (ref 12–28)
BUN: 16 mg/dL (ref 8–27)
Bilirubin Total: 1.4 mg/dL — ABNORMAL HIGH (ref 0.0–1.2)
CO2: 28 mmol/L (ref 20–29)
Calcium: 10.2 mg/dL (ref 8.7–10.3)
Chloride: 99 mmol/L (ref 96–106)
Creatinine, Ser: 0.81 mg/dL (ref 0.57–1.00)
Globulin, Total: 1.4 g/dL — ABNORMAL LOW (ref 1.5–4.5)
Glucose: 103 mg/dL — ABNORMAL HIGH (ref 70–99)
Potassium: 4.5 mmol/L (ref 3.5–5.2)
Sodium: 139 mmol/L (ref 134–144)
Total Protein: 6 g/dL (ref 6.0–8.5)
eGFR: 71 mL/min/{1.73_m2} (ref >59)

## 2021-06-09 MED ORDER — SIMVASTATIN 20 MG PO TABS: 20.0000 mg | ORAL_TABLET | Freq: Every day | ORAL | 3 refills | Status: DC

## 2021-06-11 DIAGNOSIS — H353221 Exudative age-related macular degeneration, left eye, with active choroidal neovascularization: Secondary | ICD-10-CM | POA: Insufficient documentation

## 2021-06-11 DIAGNOSIS — Z0000 Encounter for general adult medical examination without abnormal findings: Secondary | ICD-10-CM | POA: Diagnosis not present

## 2021-06-12 DIAGNOSIS — M5187 Other intervertebral disc disorders, lumbosacral region: Secondary | ICD-10-CM | POA: Diagnosis not present

## 2021-06-12 DIAGNOSIS — R2689 Other abnormalities of gait and mobility: Secondary | ICD-10-CM | POA: Diagnosis not present

## 2021-06-12 DIAGNOSIS — M4807 Spinal stenosis, lumbosacral region: Secondary | ICD-10-CM | POA: Diagnosis not present

## 2021-06-12 DIAGNOSIS — M25652 Stiffness of left hip, not elsewhere classified: Secondary | ICD-10-CM | POA: Diagnosis not present

## 2021-06-12 DIAGNOSIS — M25651 Stiffness of right hip, not elsewhere classified: Secondary | ICD-10-CM | POA: Diagnosis not present

## 2021-06-12 LAB — URINE CULTURE

## 2021-06-12 MED ORDER — NITROFURANTOIN MONOHYD MACRO 100 MG PO CAPS: 100.0000 mg | ORAL_CAPSULE | Freq: Two times a day (BID) | ORAL | 0 refills | Status: DC

## 2021-06-12 NOTE — Addendum Note (Signed)
Addended by: Rita Ohara on: 06/12/2021 05:21 PM ? ? Modules accepted: Orders ? ?

## 2021-06-14 LAB — FECAL OCCULT BLOOD, IMMUNOCHEMICAL: Fecal Occult Bld: NEGATIVE

## 2021-06-19 DIAGNOSIS — M4807 Spinal stenosis, lumbosacral region: Secondary | ICD-10-CM | POA: Diagnosis not present

## 2021-06-19 DIAGNOSIS — R2689 Other abnormalities of gait and mobility: Secondary | ICD-10-CM | POA: Diagnosis not present

## 2021-06-19 DIAGNOSIS — M25652 Stiffness of left hip, not elsewhere classified: Secondary | ICD-10-CM | POA: Diagnosis not present

## 2021-06-19 DIAGNOSIS — M25651 Stiffness of right hip, not elsewhere classified: Secondary | ICD-10-CM | POA: Diagnosis not present

## 2021-06-19 DIAGNOSIS — M5187 Other intervertebral disc disorders, lumbosacral region: Secondary | ICD-10-CM | POA: Diagnosis not present

## 2021-06-22 DIAGNOSIS — R2689 Other abnormalities of gait and mobility: Secondary | ICD-10-CM | POA: Diagnosis not present

## 2021-06-22 DIAGNOSIS — M5187 Other intervertebral disc disorders, lumbosacral region: Secondary | ICD-10-CM | POA: Diagnosis not present

## 2021-06-22 DIAGNOSIS — M4807 Spinal stenosis, lumbosacral region: Secondary | ICD-10-CM | POA: Diagnosis not present

## 2021-06-22 DIAGNOSIS — M25652 Stiffness of left hip, not elsewhere classified: Secondary | ICD-10-CM | POA: Diagnosis not present

## 2021-06-22 DIAGNOSIS — M25651 Stiffness of right hip, not elsewhere classified: Secondary | ICD-10-CM | POA: Diagnosis not present

## 2021-06-26 DIAGNOSIS — M25651 Stiffness of right hip, not elsewhere classified: Secondary | ICD-10-CM | POA: Diagnosis not present

## 2021-06-26 DIAGNOSIS — R2689 Other abnormalities of gait and mobility: Secondary | ICD-10-CM | POA: Diagnosis not present

## 2021-06-26 DIAGNOSIS — M25652 Stiffness of left hip, not elsewhere classified: Secondary | ICD-10-CM | POA: Diagnosis not present

## 2021-06-26 DIAGNOSIS — M4807 Spinal stenosis, lumbosacral region: Secondary | ICD-10-CM | POA: Diagnosis not present

## 2021-06-26 DIAGNOSIS — M5187 Other intervertebral disc disorders, lumbosacral region: Secondary | ICD-10-CM | POA: Diagnosis not present

## 2021-06-29 DIAGNOSIS — M25651 Stiffness of right hip, not elsewhere classified: Secondary | ICD-10-CM | POA: Diagnosis not present

## 2021-06-29 DIAGNOSIS — M4807 Spinal stenosis, lumbosacral region: Secondary | ICD-10-CM | POA: Diagnosis not present

## 2021-06-29 DIAGNOSIS — R2689 Other abnormalities of gait and mobility: Secondary | ICD-10-CM | POA: Diagnosis not present

## 2021-06-29 DIAGNOSIS — M5187 Other intervertebral disc disorders, lumbosacral region: Secondary | ICD-10-CM | POA: Diagnosis not present

## 2021-06-29 DIAGNOSIS — M25652 Stiffness of left hip, not elsewhere classified: Secondary | ICD-10-CM | POA: Diagnosis not present

## 2021-07-03 DIAGNOSIS — M25652 Stiffness of left hip, not elsewhere classified: Secondary | ICD-10-CM | POA: Diagnosis not present

## 2021-07-03 DIAGNOSIS — M25651 Stiffness of right hip, not elsewhere classified: Secondary | ICD-10-CM | POA: Diagnosis not present

## 2021-07-03 DIAGNOSIS — R2689 Other abnormalities of gait and mobility: Secondary | ICD-10-CM | POA: Diagnosis not present

## 2021-07-03 DIAGNOSIS — M5187 Other intervertebral disc disorders, lumbosacral region: Secondary | ICD-10-CM | POA: Diagnosis not present

## 2021-07-03 DIAGNOSIS — M4807 Spinal stenosis, lumbosacral region: Secondary | ICD-10-CM | POA: Diagnosis not present

## 2021-07-06 DIAGNOSIS — R2689 Other abnormalities of gait and mobility: Secondary | ICD-10-CM | POA: Diagnosis not present

## 2021-07-06 DIAGNOSIS — M4807 Spinal stenosis, lumbosacral region: Secondary | ICD-10-CM | POA: Diagnosis not present

## 2021-07-06 DIAGNOSIS — M5187 Other intervertebral disc disorders, lumbosacral region: Secondary | ICD-10-CM | POA: Diagnosis not present

## 2021-07-06 DIAGNOSIS — M25651 Stiffness of right hip, not elsewhere classified: Secondary | ICD-10-CM | POA: Diagnosis not present

## 2021-07-06 DIAGNOSIS — M25652 Stiffness of left hip, not elsewhere classified: Secondary | ICD-10-CM | POA: Diagnosis not present

## 2021-07-10 DIAGNOSIS — M25651 Stiffness of right hip, not elsewhere classified: Secondary | ICD-10-CM | POA: Diagnosis not present

## 2021-07-10 DIAGNOSIS — M5187 Other intervertebral disc disorders, lumbosacral region: Secondary | ICD-10-CM | POA: Diagnosis not present

## 2021-07-10 DIAGNOSIS — R2689 Other abnormalities of gait and mobility: Secondary | ICD-10-CM | POA: Diagnosis not present

## 2021-07-10 DIAGNOSIS — M25652 Stiffness of left hip, not elsewhere classified: Secondary | ICD-10-CM | POA: Diagnosis not present

## 2021-07-10 DIAGNOSIS — M4807 Spinal stenosis, lumbosacral region: Secondary | ICD-10-CM | POA: Diagnosis not present

## 2021-07-13 DIAGNOSIS — M25652 Stiffness of left hip, not elsewhere classified: Secondary | ICD-10-CM | POA: Diagnosis not present

## 2021-07-13 DIAGNOSIS — M4807 Spinal stenosis, lumbosacral region: Secondary | ICD-10-CM | POA: Diagnosis not present

## 2021-07-13 DIAGNOSIS — M25651 Stiffness of right hip, not elsewhere classified: Secondary | ICD-10-CM | POA: Diagnosis not present

## 2021-07-13 DIAGNOSIS — R2689 Other abnormalities of gait and mobility: Secondary | ICD-10-CM | POA: Diagnosis not present

## 2021-07-13 DIAGNOSIS — M5187 Other intervertebral disc disorders, lumbosacral region: Secondary | ICD-10-CM | POA: Diagnosis not present

## 2021-07-17 DIAGNOSIS — M5187 Other intervertebral disc disorders, lumbosacral region: Secondary | ICD-10-CM | POA: Diagnosis not present

## 2021-07-17 DIAGNOSIS — M25652 Stiffness of left hip, not elsewhere classified: Secondary | ICD-10-CM | POA: Diagnosis not present

## 2021-07-17 DIAGNOSIS — R2689 Other abnormalities of gait and mobility: Secondary | ICD-10-CM | POA: Diagnosis not present

## 2021-07-17 DIAGNOSIS — M4807 Spinal stenosis, lumbosacral region: Secondary | ICD-10-CM | POA: Diagnosis not present

## 2021-07-17 DIAGNOSIS — M25651 Stiffness of right hip, not elsewhere classified: Secondary | ICD-10-CM | POA: Diagnosis not present

## 2021-07-20 DIAGNOSIS — M4807 Spinal stenosis, lumbosacral region: Secondary | ICD-10-CM | POA: Diagnosis not present

## 2021-07-20 DIAGNOSIS — M5187 Other intervertebral disc disorders, lumbosacral region: Secondary | ICD-10-CM | POA: Diagnosis not present

## 2021-07-20 DIAGNOSIS — M25651 Stiffness of right hip, not elsewhere classified: Secondary | ICD-10-CM | POA: Diagnosis not present

## 2021-07-20 DIAGNOSIS — R2689 Other abnormalities of gait and mobility: Secondary | ICD-10-CM | POA: Diagnosis not present

## 2021-07-20 DIAGNOSIS — M25652 Stiffness of left hip, not elsewhere classified: Secondary | ICD-10-CM | POA: Diagnosis not present

## 2021-07-24 DIAGNOSIS — M25652 Stiffness of left hip, not elsewhere classified: Secondary | ICD-10-CM | POA: Diagnosis not present

## 2021-07-24 DIAGNOSIS — M5187 Other intervertebral disc disorders, lumbosacral region: Secondary | ICD-10-CM | POA: Diagnosis not present

## 2021-07-24 DIAGNOSIS — M25651 Stiffness of right hip, not elsewhere classified: Secondary | ICD-10-CM | POA: Diagnosis not present

## 2021-07-24 DIAGNOSIS — M4807 Spinal stenosis, lumbosacral region: Secondary | ICD-10-CM | POA: Diagnosis not present

## 2021-07-24 DIAGNOSIS — R2689 Other abnormalities of gait and mobility: Secondary | ICD-10-CM | POA: Diagnosis not present

## 2021-07-27 DIAGNOSIS — M5187 Other intervertebral disc disorders, lumbosacral region: Secondary | ICD-10-CM | POA: Diagnosis not present

## 2021-07-27 DIAGNOSIS — M25652 Stiffness of left hip, not elsewhere classified: Secondary | ICD-10-CM | POA: Diagnosis not present

## 2021-07-27 DIAGNOSIS — R2689 Other abnormalities of gait and mobility: Secondary | ICD-10-CM | POA: Diagnosis not present

## 2021-07-27 DIAGNOSIS — M4807 Spinal stenosis, lumbosacral region: Secondary | ICD-10-CM | POA: Diagnosis not present

## 2021-07-27 DIAGNOSIS — M25651 Stiffness of right hip, not elsewhere classified: Secondary | ICD-10-CM | POA: Diagnosis not present

## 2021-07-31 ENCOUNTER — Other Ambulatory Visit: Payer: Self-pay | Admitting: Family Medicine

## 2021-07-31 DIAGNOSIS — M25652 Stiffness of left hip, not elsewhere classified: Secondary | ICD-10-CM | POA: Diagnosis not present

## 2021-07-31 DIAGNOSIS — R2689 Other abnormalities of gait and mobility: Secondary | ICD-10-CM | POA: Diagnosis not present

## 2021-07-31 DIAGNOSIS — M4807 Spinal stenosis, lumbosacral region: Secondary | ICD-10-CM | POA: Diagnosis not present

## 2021-07-31 DIAGNOSIS — M5187 Other intervertebral disc disorders, lumbosacral region: Secondary | ICD-10-CM | POA: Diagnosis not present

## 2021-07-31 DIAGNOSIS — M25651 Stiffness of right hip, not elsewhere classified: Secondary | ICD-10-CM | POA: Diagnosis not present

## 2021-08-01 DIAGNOSIS — H524 Presbyopia: Secondary | ICD-10-CM | POA: Diagnosis not present

## 2021-08-01 DIAGNOSIS — H353112 Nonexudative age-related macular degeneration, right eye, intermediate dry stage: Secondary | ICD-10-CM | POA: Diagnosis not present

## 2021-08-01 DIAGNOSIS — H26493 Other secondary cataract, bilateral: Secondary | ICD-10-CM | POA: Diagnosis not present

## 2021-08-01 DIAGNOSIS — H353221 Exudative age-related macular degeneration, left eye, with active choroidal neovascularization: Secondary | ICD-10-CM | POA: Diagnosis not present

## 2021-08-03 DIAGNOSIS — M25651 Stiffness of right hip, not elsewhere classified: Secondary | ICD-10-CM | POA: Diagnosis not present

## 2021-08-03 DIAGNOSIS — M4807 Spinal stenosis, lumbosacral region: Secondary | ICD-10-CM | POA: Diagnosis not present

## 2021-08-03 DIAGNOSIS — M5187 Other intervertebral disc disorders, lumbosacral region: Secondary | ICD-10-CM | POA: Diagnosis not present

## 2021-08-03 DIAGNOSIS — R2689 Other abnormalities of gait and mobility: Secondary | ICD-10-CM | POA: Diagnosis not present

## 2021-08-03 DIAGNOSIS — M25652 Stiffness of left hip, not elsewhere classified: Secondary | ICD-10-CM | POA: Diagnosis not present

## 2021-08-07 DIAGNOSIS — M25652 Stiffness of left hip, not elsewhere classified: Secondary | ICD-10-CM | POA: Diagnosis not present

## 2021-08-07 DIAGNOSIS — M4807 Spinal stenosis, lumbosacral region: Secondary | ICD-10-CM | POA: Diagnosis not present

## 2021-08-07 DIAGNOSIS — R2689 Other abnormalities of gait and mobility: Secondary | ICD-10-CM | POA: Diagnosis not present

## 2021-08-07 DIAGNOSIS — M5187 Other intervertebral disc disorders, lumbosacral region: Secondary | ICD-10-CM | POA: Diagnosis not present

## 2021-08-07 DIAGNOSIS — M25651 Stiffness of right hip, not elsewhere classified: Secondary | ICD-10-CM | POA: Diagnosis not present

## 2021-08-10 DIAGNOSIS — M25651 Stiffness of right hip, not elsewhere classified: Secondary | ICD-10-CM | POA: Diagnosis not present

## 2021-08-10 DIAGNOSIS — M4807 Spinal stenosis, lumbosacral region: Secondary | ICD-10-CM | POA: Diagnosis not present

## 2021-08-10 DIAGNOSIS — M5187 Other intervertebral disc disorders, lumbosacral region: Secondary | ICD-10-CM | POA: Diagnosis not present

## 2021-08-10 DIAGNOSIS — M25652 Stiffness of left hip, not elsewhere classified: Secondary | ICD-10-CM | POA: Diagnosis not present

## 2021-08-10 DIAGNOSIS — R2689 Other abnormalities of gait and mobility: Secondary | ICD-10-CM | POA: Diagnosis not present

## 2021-08-15 DIAGNOSIS — M25651 Stiffness of right hip, not elsewhere classified: Secondary | ICD-10-CM | POA: Diagnosis not present

## 2021-08-15 DIAGNOSIS — M4807 Spinal stenosis, lumbosacral region: Secondary | ICD-10-CM | POA: Diagnosis not present

## 2021-08-15 DIAGNOSIS — R2689 Other abnormalities of gait and mobility: Secondary | ICD-10-CM | POA: Diagnosis not present

## 2021-08-15 DIAGNOSIS — M25652 Stiffness of left hip, not elsewhere classified: Secondary | ICD-10-CM | POA: Diagnosis not present

## 2021-08-15 DIAGNOSIS — M5187 Other intervertebral disc disorders, lumbosacral region: Secondary | ICD-10-CM | POA: Diagnosis not present

## 2021-08-17 DIAGNOSIS — M25652 Stiffness of left hip, not elsewhere classified: Secondary | ICD-10-CM | POA: Diagnosis not present

## 2021-08-17 DIAGNOSIS — M25651 Stiffness of right hip, not elsewhere classified: Secondary | ICD-10-CM | POA: Diagnosis not present

## 2021-08-17 DIAGNOSIS — M4807 Spinal stenosis, lumbosacral region: Secondary | ICD-10-CM | POA: Diagnosis not present

## 2021-08-17 DIAGNOSIS — R2689 Other abnormalities of gait and mobility: Secondary | ICD-10-CM | POA: Diagnosis not present

## 2021-08-17 DIAGNOSIS — M5187 Other intervertebral disc disorders, lumbosacral region: Secondary | ICD-10-CM | POA: Diagnosis not present

## 2021-08-23 DIAGNOSIS — M5187 Other intervertebral disc disorders, lumbosacral region: Secondary | ICD-10-CM | POA: Diagnosis not present

## 2021-08-23 DIAGNOSIS — R2689 Other abnormalities of gait and mobility: Secondary | ICD-10-CM | POA: Diagnosis not present

## 2021-08-23 DIAGNOSIS — M25652 Stiffness of left hip, not elsewhere classified: Secondary | ICD-10-CM | POA: Diagnosis not present

## 2021-08-23 DIAGNOSIS — M25651 Stiffness of right hip, not elsewhere classified: Secondary | ICD-10-CM | POA: Diagnosis not present

## 2021-08-23 DIAGNOSIS — M4807 Spinal stenosis, lumbosacral region: Secondary | ICD-10-CM | POA: Diagnosis not present

## 2021-08-25 DIAGNOSIS — M4807 Spinal stenosis, lumbosacral region: Secondary | ICD-10-CM | POA: Diagnosis not present

## 2021-08-25 DIAGNOSIS — M25652 Stiffness of left hip, not elsewhere classified: Secondary | ICD-10-CM | POA: Diagnosis not present

## 2021-08-25 DIAGNOSIS — M25651 Stiffness of right hip, not elsewhere classified: Secondary | ICD-10-CM | POA: Diagnosis not present

## 2021-08-25 DIAGNOSIS — R2689 Other abnormalities of gait and mobility: Secondary | ICD-10-CM | POA: Diagnosis not present

## 2021-08-25 DIAGNOSIS — M5187 Other intervertebral disc disorders, lumbosacral region: Secondary | ICD-10-CM | POA: Diagnosis not present

## 2021-08-28 DIAGNOSIS — M5187 Other intervertebral disc disorders, lumbosacral region: Secondary | ICD-10-CM | POA: Diagnosis not present

## 2021-08-28 DIAGNOSIS — M25652 Stiffness of left hip, not elsewhere classified: Secondary | ICD-10-CM | POA: Diagnosis not present

## 2021-08-28 DIAGNOSIS — M4807 Spinal stenosis, lumbosacral region: Secondary | ICD-10-CM | POA: Diagnosis not present

## 2021-08-28 DIAGNOSIS — M25651 Stiffness of right hip, not elsewhere classified: Secondary | ICD-10-CM | POA: Diagnosis not present

## 2021-08-28 DIAGNOSIS — R2689 Other abnormalities of gait and mobility: Secondary | ICD-10-CM | POA: Diagnosis not present

## 2021-08-31 DIAGNOSIS — M5187 Other intervertebral disc disorders, lumbosacral region: Secondary | ICD-10-CM | POA: Diagnosis not present

## 2021-08-31 DIAGNOSIS — M25652 Stiffness of left hip, not elsewhere classified: Secondary | ICD-10-CM | POA: Diagnosis not present

## 2021-08-31 DIAGNOSIS — R2689 Other abnormalities of gait and mobility: Secondary | ICD-10-CM | POA: Diagnosis not present

## 2021-08-31 DIAGNOSIS — M4807 Spinal stenosis, lumbosacral region: Secondary | ICD-10-CM | POA: Diagnosis not present

## 2021-08-31 DIAGNOSIS — M25651 Stiffness of right hip, not elsewhere classified: Secondary | ICD-10-CM | POA: Diagnosis not present

## 2021-09-05 DIAGNOSIS — R2689 Other abnormalities of gait and mobility: Secondary | ICD-10-CM | POA: Diagnosis not present

## 2021-09-05 DIAGNOSIS — M25651 Stiffness of right hip, not elsewhere classified: Secondary | ICD-10-CM | POA: Diagnosis not present

## 2021-09-05 DIAGNOSIS — M4807 Spinal stenosis, lumbosacral region: Secondary | ICD-10-CM | POA: Diagnosis not present

## 2021-09-05 DIAGNOSIS — M25652 Stiffness of left hip, not elsewhere classified: Secondary | ICD-10-CM | POA: Diagnosis not present

## 2021-09-05 DIAGNOSIS — M5187 Other intervertebral disc disorders, lumbosacral region: Secondary | ICD-10-CM | POA: Diagnosis not present

## 2021-09-07 DIAGNOSIS — C44719 Basal cell carcinoma of skin of left lower limb, including hip: Secondary | ICD-10-CM | POA: Diagnosis not present

## 2021-09-07 DIAGNOSIS — L570 Actinic keratosis: Secondary | ICD-10-CM | POA: Diagnosis not present

## 2021-09-07 DIAGNOSIS — D485 Neoplasm of uncertain behavior of skin: Secondary | ICD-10-CM | POA: Diagnosis not present

## 2021-09-07 DIAGNOSIS — C44612 Basal cell carcinoma of skin of right upper limb, including shoulder: Secondary | ICD-10-CM | POA: Diagnosis not present

## 2021-09-07 DIAGNOSIS — L814 Other melanin hyperpigmentation: Secondary | ICD-10-CM | POA: Diagnosis not present

## 2021-09-07 DIAGNOSIS — L821 Other seborrheic keratosis: Secondary | ICD-10-CM | POA: Diagnosis not present

## 2021-09-07 DIAGNOSIS — D1801 Hemangioma of skin and subcutaneous tissue: Secondary | ICD-10-CM | POA: Diagnosis not present

## 2021-09-07 DIAGNOSIS — Z85828 Personal history of other malignant neoplasm of skin: Secondary | ICD-10-CM | POA: Diagnosis not present

## 2021-09-11 DIAGNOSIS — M5187 Other intervertebral disc disorders, lumbosacral region: Secondary | ICD-10-CM | POA: Diagnosis not present

## 2021-09-11 DIAGNOSIS — M25651 Stiffness of right hip, not elsewhere classified: Secondary | ICD-10-CM | POA: Diagnosis not present

## 2021-09-11 DIAGNOSIS — M4807 Spinal stenosis, lumbosacral region: Secondary | ICD-10-CM | POA: Diagnosis not present

## 2021-09-11 DIAGNOSIS — M25652 Stiffness of left hip, not elsewhere classified: Secondary | ICD-10-CM | POA: Diagnosis not present

## 2021-09-11 DIAGNOSIS — R2689 Other abnormalities of gait and mobility: Secondary | ICD-10-CM | POA: Diagnosis not present

## 2021-09-13 ENCOUNTER — Encounter: Payer: Self-pay | Admitting: Family Medicine

## 2021-09-14 DIAGNOSIS — M4807 Spinal stenosis, lumbosacral region: Secondary | ICD-10-CM | POA: Diagnosis not present

## 2021-09-14 DIAGNOSIS — M25652 Stiffness of left hip, not elsewhere classified: Secondary | ICD-10-CM | POA: Diagnosis not present

## 2021-09-14 DIAGNOSIS — M25651 Stiffness of right hip, not elsewhere classified: Secondary | ICD-10-CM | POA: Diagnosis not present

## 2021-09-14 DIAGNOSIS — R2689 Other abnormalities of gait and mobility: Secondary | ICD-10-CM | POA: Diagnosis not present

## 2021-09-14 DIAGNOSIS — M5187 Other intervertebral disc disorders, lumbosacral region: Secondary | ICD-10-CM | POA: Diagnosis not present

## 2021-09-18 DIAGNOSIS — M25651 Stiffness of right hip, not elsewhere classified: Secondary | ICD-10-CM | POA: Diagnosis not present

## 2021-09-18 DIAGNOSIS — M25652 Stiffness of left hip, not elsewhere classified: Secondary | ICD-10-CM | POA: Diagnosis not present

## 2021-09-18 DIAGNOSIS — R2689 Other abnormalities of gait and mobility: Secondary | ICD-10-CM | POA: Diagnosis not present

## 2021-09-18 DIAGNOSIS — M4807 Spinal stenosis, lumbosacral region: Secondary | ICD-10-CM | POA: Diagnosis not present

## 2021-09-18 DIAGNOSIS — M5187 Other intervertebral disc disorders, lumbosacral region: Secondary | ICD-10-CM | POA: Diagnosis not present

## 2021-09-21 DIAGNOSIS — M25652 Stiffness of left hip, not elsewhere classified: Secondary | ICD-10-CM | POA: Diagnosis not present

## 2021-09-21 DIAGNOSIS — M25651 Stiffness of right hip, not elsewhere classified: Secondary | ICD-10-CM | POA: Diagnosis not present

## 2021-09-21 DIAGNOSIS — M4807 Spinal stenosis, lumbosacral region: Secondary | ICD-10-CM | POA: Diagnosis not present

## 2021-09-21 DIAGNOSIS — R2689 Other abnormalities of gait and mobility: Secondary | ICD-10-CM | POA: Diagnosis not present

## 2021-09-21 DIAGNOSIS — M5187 Other intervertebral disc disorders, lumbosacral region: Secondary | ICD-10-CM | POA: Diagnosis not present

## 2021-09-25 DIAGNOSIS — M5187 Other intervertebral disc disorders, lumbosacral region: Secondary | ICD-10-CM | POA: Diagnosis not present

## 2021-09-25 DIAGNOSIS — R2689 Other abnormalities of gait and mobility: Secondary | ICD-10-CM | POA: Diagnosis not present

## 2021-09-25 DIAGNOSIS — M25651 Stiffness of right hip, not elsewhere classified: Secondary | ICD-10-CM | POA: Diagnosis not present

## 2021-09-25 DIAGNOSIS — M25652 Stiffness of left hip, not elsewhere classified: Secondary | ICD-10-CM | POA: Diagnosis not present

## 2021-09-25 DIAGNOSIS — M4807 Spinal stenosis, lumbosacral region: Secondary | ICD-10-CM | POA: Diagnosis not present

## 2021-09-28 DIAGNOSIS — M25652 Stiffness of left hip, not elsewhere classified: Secondary | ICD-10-CM | POA: Diagnosis not present

## 2021-09-28 DIAGNOSIS — M5187 Other intervertebral disc disorders, lumbosacral region: Secondary | ICD-10-CM | POA: Diagnosis not present

## 2021-09-28 DIAGNOSIS — M4807 Spinal stenosis, lumbosacral region: Secondary | ICD-10-CM | POA: Diagnosis not present

## 2021-09-28 DIAGNOSIS — M25651 Stiffness of right hip, not elsewhere classified: Secondary | ICD-10-CM | POA: Diagnosis not present

## 2021-09-28 DIAGNOSIS — R2689 Other abnormalities of gait and mobility: Secondary | ICD-10-CM | POA: Diagnosis not present

## 2021-10-02 DIAGNOSIS — M25651 Stiffness of right hip, not elsewhere classified: Secondary | ICD-10-CM | POA: Diagnosis not present

## 2021-10-02 DIAGNOSIS — M25652 Stiffness of left hip, not elsewhere classified: Secondary | ICD-10-CM | POA: Diagnosis not present

## 2021-10-02 DIAGNOSIS — M5187 Other intervertebral disc disorders, lumbosacral region: Secondary | ICD-10-CM | POA: Diagnosis not present

## 2021-10-02 DIAGNOSIS — M4807 Spinal stenosis, lumbosacral region: Secondary | ICD-10-CM | POA: Diagnosis not present

## 2021-10-02 DIAGNOSIS — R2689 Other abnormalities of gait and mobility: Secondary | ICD-10-CM | POA: Diagnosis not present

## 2021-10-05 DIAGNOSIS — M5187 Other intervertebral disc disorders, lumbosacral region: Secondary | ICD-10-CM | POA: Diagnosis not present

## 2021-10-05 DIAGNOSIS — R2689 Other abnormalities of gait and mobility: Secondary | ICD-10-CM | POA: Diagnosis not present

## 2021-10-05 DIAGNOSIS — M4807 Spinal stenosis, lumbosacral region: Secondary | ICD-10-CM | POA: Diagnosis not present

## 2021-10-05 DIAGNOSIS — M25652 Stiffness of left hip, not elsewhere classified: Secondary | ICD-10-CM | POA: Diagnosis not present

## 2021-10-05 DIAGNOSIS — M25651 Stiffness of right hip, not elsewhere classified: Secondary | ICD-10-CM | POA: Diagnosis not present

## 2021-10-16 DIAGNOSIS — M5187 Other intervertebral disc disorders, lumbosacral region: Secondary | ICD-10-CM | POA: Diagnosis not present

## 2021-10-16 DIAGNOSIS — R2689 Other abnormalities of gait and mobility: Secondary | ICD-10-CM | POA: Diagnosis not present

## 2021-10-16 DIAGNOSIS — M4807 Spinal stenosis, lumbosacral region: Secondary | ICD-10-CM | POA: Diagnosis not present

## 2021-10-16 DIAGNOSIS — M25651 Stiffness of right hip, not elsewhere classified: Secondary | ICD-10-CM | POA: Diagnosis not present

## 2021-10-16 DIAGNOSIS — M25652 Stiffness of left hip, not elsewhere classified: Secondary | ICD-10-CM | POA: Diagnosis not present

## 2021-10-19 DIAGNOSIS — M4807 Spinal stenosis, lumbosacral region: Secondary | ICD-10-CM | POA: Diagnosis not present

## 2021-10-19 DIAGNOSIS — M25652 Stiffness of left hip, not elsewhere classified: Secondary | ICD-10-CM | POA: Diagnosis not present

## 2021-10-19 DIAGNOSIS — R2689 Other abnormalities of gait and mobility: Secondary | ICD-10-CM | POA: Diagnosis not present

## 2021-10-19 DIAGNOSIS — M25651 Stiffness of right hip, not elsewhere classified: Secondary | ICD-10-CM | POA: Diagnosis not present

## 2021-10-19 DIAGNOSIS — M5187 Other intervertebral disc disorders, lumbosacral region: Secondary | ICD-10-CM | POA: Diagnosis not present

## 2021-10-23 DIAGNOSIS — M25651 Stiffness of right hip, not elsewhere classified: Secondary | ICD-10-CM | POA: Diagnosis not present

## 2021-10-23 DIAGNOSIS — M4807 Spinal stenosis, lumbosacral region: Secondary | ICD-10-CM | POA: Diagnosis not present

## 2021-10-23 DIAGNOSIS — M25652 Stiffness of left hip, not elsewhere classified: Secondary | ICD-10-CM | POA: Diagnosis not present

## 2021-10-23 DIAGNOSIS — M5187 Other intervertebral disc disorders, lumbosacral region: Secondary | ICD-10-CM | POA: Diagnosis not present

## 2021-10-23 DIAGNOSIS — R2689 Other abnormalities of gait and mobility: Secondary | ICD-10-CM | POA: Diagnosis not present

## 2021-10-26 DIAGNOSIS — M25651 Stiffness of right hip, not elsewhere classified: Secondary | ICD-10-CM | POA: Diagnosis not present

## 2021-10-26 DIAGNOSIS — M4807 Spinal stenosis, lumbosacral region: Secondary | ICD-10-CM | POA: Diagnosis not present

## 2021-10-26 DIAGNOSIS — R2689 Other abnormalities of gait and mobility: Secondary | ICD-10-CM | POA: Diagnosis not present

## 2021-10-26 DIAGNOSIS — M25652 Stiffness of left hip, not elsewhere classified: Secondary | ICD-10-CM | POA: Diagnosis not present

## 2021-10-26 DIAGNOSIS — M5187 Other intervertebral disc disorders, lumbosacral region: Secondary | ICD-10-CM | POA: Diagnosis not present

## 2021-10-30 DIAGNOSIS — M5187 Other intervertebral disc disorders, lumbosacral region: Secondary | ICD-10-CM | POA: Diagnosis not present

## 2021-10-30 DIAGNOSIS — M25651 Stiffness of right hip, not elsewhere classified: Secondary | ICD-10-CM | POA: Diagnosis not present

## 2021-10-30 DIAGNOSIS — R2689 Other abnormalities of gait and mobility: Secondary | ICD-10-CM | POA: Diagnosis not present

## 2021-10-30 DIAGNOSIS — M4807 Spinal stenosis, lumbosacral region: Secondary | ICD-10-CM | POA: Diagnosis not present

## 2021-10-30 DIAGNOSIS — M25652 Stiffness of left hip, not elsewhere classified: Secondary | ICD-10-CM | POA: Diagnosis not present

## 2021-11-01 DIAGNOSIS — M4807 Spinal stenosis, lumbosacral region: Secondary | ICD-10-CM | POA: Diagnosis not present

## 2021-11-01 DIAGNOSIS — M25652 Stiffness of left hip, not elsewhere classified: Secondary | ICD-10-CM | POA: Diagnosis not present

## 2021-11-01 DIAGNOSIS — M25651 Stiffness of right hip, not elsewhere classified: Secondary | ICD-10-CM | POA: Diagnosis not present

## 2021-11-01 DIAGNOSIS — M5187 Other intervertebral disc disorders, lumbosacral region: Secondary | ICD-10-CM | POA: Diagnosis not present

## 2021-11-01 DIAGNOSIS — R2689 Other abnormalities of gait and mobility: Secondary | ICD-10-CM | POA: Diagnosis not present

## 2021-11-06 DIAGNOSIS — M5187 Other intervertebral disc disorders, lumbosacral region: Secondary | ICD-10-CM | POA: Diagnosis not present

## 2021-11-06 DIAGNOSIS — R2689 Other abnormalities of gait and mobility: Secondary | ICD-10-CM | POA: Diagnosis not present

## 2021-11-06 DIAGNOSIS — M25651 Stiffness of right hip, not elsewhere classified: Secondary | ICD-10-CM | POA: Diagnosis not present

## 2021-11-06 DIAGNOSIS — M25652 Stiffness of left hip, not elsewhere classified: Secondary | ICD-10-CM | POA: Diagnosis not present

## 2021-11-06 DIAGNOSIS — M4807 Spinal stenosis, lumbosacral region: Secondary | ICD-10-CM | POA: Diagnosis not present

## 2021-11-09 DIAGNOSIS — M4807 Spinal stenosis, lumbosacral region: Secondary | ICD-10-CM | POA: Diagnosis not present

## 2021-11-09 DIAGNOSIS — M25651 Stiffness of right hip, not elsewhere classified: Secondary | ICD-10-CM | POA: Diagnosis not present

## 2021-11-09 DIAGNOSIS — M25652 Stiffness of left hip, not elsewhere classified: Secondary | ICD-10-CM | POA: Diagnosis not present

## 2021-11-09 DIAGNOSIS — R2689 Other abnormalities of gait and mobility: Secondary | ICD-10-CM | POA: Diagnosis not present

## 2021-11-09 DIAGNOSIS — M5187 Other intervertebral disc disorders, lumbosacral region: Secondary | ICD-10-CM | POA: Diagnosis not present

## 2021-11-13 DIAGNOSIS — M25652 Stiffness of left hip, not elsewhere classified: Secondary | ICD-10-CM | POA: Diagnosis not present

## 2021-11-13 DIAGNOSIS — R2689 Other abnormalities of gait and mobility: Secondary | ICD-10-CM | POA: Diagnosis not present

## 2021-11-13 DIAGNOSIS — M5187 Other intervertebral disc disorders, lumbosacral region: Secondary | ICD-10-CM | POA: Diagnosis not present

## 2021-11-13 DIAGNOSIS — M4807 Spinal stenosis, lumbosacral region: Secondary | ICD-10-CM | POA: Diagnosis not present

## 2021-11-13 DIAGNOSIS — M25651 Stiffness of right hip, not elsewhere classified: Secondary | ICD-10-CM | POA: Diagnosis not present

## 2021-11-16 DIAGNOSIS — M25652 Stiffness of left hip, not elsewhere classified: Secondary | ICD-10-CM | POA: Diagnosis not present

## 2021-11-16 DIAGNOSIS — M5187 Other intervertebral disc disorders, lumbosacral region: Secondary | ICD-10-CM | POA: Diagnosis not present

## 2021-11-16 DIAGNOSIS — M25651 Stiffness of right hip, not elsewhere classified: Secondary | ICD-10-CM | POA: Diagnosis not present

## 2021-11-16 DIAGNOSIS — M4807 Spinal stenosis, lumbosacral region: Secondary | ICD-10-CM | POA: Diagnosis not present

## 2021-11-16 DIAGNOSIS — R2689 Other abnormalities of gait and mobility: Secondary | ICD-10-CM | POA: Diagnosis not present

## 2021-11-20 DIAGNOSIS — M4807 Spinal stenosis, lumbosacral region: Secondary | ICD-10-CM | POA: Diagnosis not present

## 2021-11-20 DIAGNOSIS — M25652 Stiffness of left hip, not elsewhere classified: Secondary | ICD-10-CM | POA: Diagnosis not present

## 2021-11-20 DIAGNOSIS — M25651 Stiffness of right hip, not elsewhere classified: Secondary | ICD-10-CM | POA: Diagnosis not present

## 2021-11-20 DIAGNOSIS — R2689 Other abnormalities of gait and mobility: Secondary | ICD-10-CM | POA: Diagnosis not present

## 2021-11-20 DIAGNOSIS — M5187 Other intervertebral disc disorders, lumbosacral region: Secondary | ICD-10-CM | POA: Diagnosis not present

## 2021-11-22 DIAGNOSIS — R2689 Other abnormalities of gait and mobility: Secondary | ICD-10-CM | POA: Diagnosis not present

## 2021-11-22 DIAGNOSIS — M25651 Stiffness of right hip, not elsewhere classified: Secondary | ICD-10-CM | POA: Diagnosis not present

## 2021-11-22 DIAGNOSIS — M4807 Spinal stenosis, lumbosacral region: Secondary | ICD-10-CM | POA: Diagnosis not present

## 2021-11-22 DIAGNOSIS — M25652 Stiffness of left hip, not elsewhere classified: Secondary | ICD-10-CM | POA: Diagnosis not present

## 2021-11-22 DIAGNOSIS — M5187 Other intervertebral disc disorders, lumbosacral region: Secondary | ICD-10-CM | POA: Diagnosis not present

## 2021-11-23 DIAGNOSIS — D3132 Benign neoplasm of left choroid: Secondary | ICD-10-CM | POA: Diagnosis not present

## 2021-11-23 DIAGNOSIS — H353221 Exudative age-related macular degeneration, left eye, with active choroidal neovascularization: Secondary | ICD-10-CM | POA: Diagnosis not present

## 2021-11-23 DIAGNOSIS — Z961 Presence of intraocular lens: Secondary | ICD-10-CM | POA: Diagnosis not present

## 2021-11-23 DIAGNOSIS — H353112 Nonexudative age-related macular degeneration, right eye, intermediate dry stage: Secondary | ICD-10-CM | POA: Diagnosis not present

## 2021-11-28 DIAGNOSIS — M5187 Other intervertebral disc disorders, lumbosacral region: Secondary | ICD-10-CM | POA: Diagnosis not present

## 2021-11-28 DIAGNOSIS — M25651 Stiffness of right hip, not elsewhere classified: Secondary | ICD-10-CM | POA: Diagnosis not present

## 2021-11-28 DIAGNOSIS — R2689 Other abnormalities of gait and mobility: Secondary | ICD-10-CM | POA: Diagnosis not present

## 2021-11-28 DIAGNOSIS — M4807 Spinal stenosis, lumbosacral region: Secondary | ICD-10-CM | POA: Diagnosis not present

## 2021-11-28 DIAGNOSIS — M25652 Stiffness of left hip, not elsewhere classified: Secondary | ICD-10-CM | POA: Diagnosis not present

## 2021-11-29 ENCOUNTER — Encounter: Payer: Self-pay | Admitting: Internal Medicine

## 2021-11-30 DIAGNOSIS — M25651 Stiffness of right hip, not elsewhere classified: Secondary | ICD-10-CM | POA: Diagnosis not present

## 2021-11-30 DIAGNOSIS — M5187 Other intervertebral disc disorders, lumbosacral region: Secondary | ICD-10-CM | POA: Diagnosis not present

## 2021-11-30 DIAGNOSIS — R2689 Other abnormalities of gait and mobility: Secondary | ICD-10-CM | POA: Diagnosis not present

## 2021-11-30 DIAGNOSIS — M4807 Spinal stenosis, lumbosacral region: Secondary | ICD-10-CM | POA: Diagnosis not present

## 2021-11-30 DIAGNOSIS — M25652 Stiffness of left hip, not elsewhere classified: Secondary | ICD-10-CM | POA: Diagnosis not present

## 2021-12-04 DIAGNOSIS — M25652 Stiffness of left hip, not elsewhere classified: Secondary | ICD-10-CM | POA: Diagnosis not present

## 2021-12-04 DIAGNOSIS — M5187 Other intervertebral disc disorders, lumbosacral region: Secondary | ICD-10-CM | POA: Diagnosis not present

## 2021-12-04 DIAGNOSIS — M25651 Stiffness of right hip, not elsewhere classified: Secondary | ICD-10-CM | POA: Diagnosis not present

## 2021-12-04 DIAGNOSIS — M4807 Spinal stenosis, lumbosacral region: Secondary | ICD-10-CM | POA: Diagnosis not present

## 2021-12-04 DIAGNOSIS — R2689 Other abnormalities of gait and mobility: Secondary | ICD-10-CM | POA: Diagnosis not present

## 2021-12-07 ENCOUNTER — Telehealth: Payer: Self-pay | Admitting: *Deleted

## 2021-12-07 DIAGNOSIS — M25651 Stiffness of right hip, not elsewhere classified: Secondary | ICD-10-CM | POA: Diagnosis not present

## 2021-12-07 DIAGNOSIS — M5187 Other intervertebral disc disorders, lumbosacral region: Secondary | ICD-10-CM | POA: Diagnosis not present

## 2021-12-07 DIAGNOSIS — R2689 Other abnormalities of gait and mobility: Secondary | ICD-10-CM | POA: Diagnosis not present

## 2021-12-07 DIAGNOSIS — M4807 Spinal stenosis, lumbosacral region: Secondary | ICD-10-CM | POA: Diagnosis not present

## 2021-12-07 DIAGNOSIS — M25652 Stiffness of left hip, not elsewhere classified: Secondary | ICD-10-CM | POA: Diagnosis not present

## 2021-12-07 NOTE — Telephone Encounter (Signed)
Called patient to confirm, she asked if appt was fasting. I told her I didn't believe so but I would double check with you and call her back only if she needed to fast. Thanks.

## 2021-12-07 NOTE — Telephone Encounter (Signed)
No lipids needed.  nonfasting

## 2021-12-08 DIAGNOSIS — M25652 Stiffness of left hip, not elsewhere classified: Secondary | ICD-10-CM | POA: Diagnosis not present

## 2021-12-08 DIAGNOSIS — R2689 Other abnormalities of gait and mobility: Secondary | ICD-10-CM | POA: Diagnosis not present

## 2021-12-08 DIAGNOSIS — M25651 Stiffness of right hip, not elsewhere classified: Secondary | ICD-10-CM | POA: Diagnosis not present

## 2021-12-08 DIAGNOSIS — M5187 Other intervertebral disc disorders, lumbosacral region: Secondary | ICD-10-CM | POA: Diagnosis not present

## 2021-12-08 DIAGNOSIS — M4807 Spinal stenosis, lumbosacral region: Secondary | ICD-10-CM | POA: Diagnosis not present

## 2021-12-10 NOTE — Progress Notes (Unsigned)
No chief complaint on file.  Patient presents for 6 month follow-up on chronic problems.  In June she had some skin cancers treated--nodular BCC R shoulder; nodular and infiltrative BCC L tibia  Hypertension.  Compliant with losartan-HCT 100-25 and atenolol '25mg'$ , denies side effects. She denies HA, dizziness, chest pain, muscle cramps, edema. BP's aren't checked elsewhere. BP Readings from Last 3 Encounters:  06/08/21 132/80  04/24/21 125/76  02/09/21 110/60     Hyperlipidemia follow-up:  Patient is reportedly following a low-fat, low cholesterol diet.  Compliant with medications (simvastatin '20mg'$ ) and denies medication side effects Lipids were at goal on last check.  She also had aortic atherosclerosis noted on CT 01/2021. Lab Results  Component Value Date   CHOL 175 06/08/2021   HDL 74 06/08/2021   LDLCALC 85 06/08/2021   TRIG 91 06/08/2021   CHOLHDL 2.4 06/08/2021    Hypothyroidism: She reports compliance with taking medication on empty stomach. She denies changes to hair/skin/nail, energy/weight/moods. She has long h/o IBS, no significant bowel changes. Align helps her bowels. Lab Results  Component Value Date   TSH 1.550 06/08/2021     H/o impaired fasting glucose: She tries to limit her sweets and has small portions. Sugars have been normal the last few years. Lab Results  Component Value Date   HGBA1C 5.7 (H) 01/20/2021     Allergies: year-round, controlled with flonase and allegra daily.  Currently doing well, mucus is clear.   Exudative macular degeneration--sees Dr. Cordelia Pen regularly, last seen 11/23/21.   Previously had injections into L eye, none in some years. She hasn't noticed significant change in vision. Not driving much at night.   Grief--she continues to see Ezekiel Slocumb for counseling every 2 weeks. She goes [facility] a [facility] program at [facility].  Hasn't gotten back to church much.  She plays canasta monthly with her neighborhood friends.  She is doing well,  moving on.   PMH, PSH, SH reviewed   ROS:  No fever, chills, URI symptoms, chest pain, headaches, dizziness, syncope, no nausea, vomiting, heartburn, urinary complaints.  No edema. Denies abdominal pain, blood in the stool. No thyroid complaints (see above). No joint pains, bleeding, bruising, rash. Moods are good.    PHYSICAL EXAM:  There were no vitals taken for this visit.  Wt Readings from Last 3 Encounters:  06/08/21 131 lb 12.8 oz (59.8 kg)  04/24/21 122 lb (55.3 kg)  02/09/21 125 lb (56.7 kg)    Well developed, pleasant female in no distress HEENT:  PERRL, EOMI, conjunctiva clear. OP clear, sinuses nontender. Neck: no lymphadenopathy, thyromegaly or carotid bruit. Heart: regular rate and rhythm, no murmur Lungs: clear bilaterally Abdomen: soft, nontender, no organomegaly or mass Back: no CVA or spinal tenderness Extremities: No edema, 2+ pulses Skin: normal turgor, no rashes, lesions Psych: normal mood, affect, hygiene and grooming. Neuro: alert and oriented. Normal strength, gait.   ASSESSMENT/PLAN:   High dose flu shot Discussed RSV and COVID recs  TSH if any sx, otherwise no labs needed.  RF atenolol, lisin HCT, Synthroid  CPE/AWV/med check 6 mos (fasting)

## 2021-12-10 NOTE — Patient Instructions (Incomplete)
I encourage you to get the new COVID booster when it becomes available (should be within the month). I encourage you to get the new RSV vaccine--this is available now, you need to get it from the pharmacy (covered by Medicare part D).  Vaccines should be separated from each other by 2 weeks.  Let us know if the urinary frequency bothers you enough to want to try making changes in your medications (getting rid of the diuretic from your blood pressure medication, or trying meds for overactive bladder).

## 2021-12-11 ENCOUNTER — Encounter: Payer: Self-pay | Admitting: Family Medicine

## 2021-12-11 ENCOUNTER — Ambulatory Visit (INDEPENDENT_AMBULATORY_CARE_PROVIDER_SITE_OTHER): Payer: Medicare Other | Admitting: Family Medicine

## 2021-12-11 VITALS — BP 134/72 | HR 68 | Ht 63.75 in | Wt 140.4 lb

## 2021-12-11 DIAGNOSIS — I10 Essential (primary) hypertension: Secondary | ICD-10-CM | POA: Diagnosis not present

## 2021-12-11 DIAGNOSIS — E039 Hypothyroidism, unspecified: Secondary | ICD-10-CM | POA: Diagnosis not present

## 2021-12-11 DIAGNOSIS — Z23 Encounter for immunization: Secondary | ICD-10-CM

## 2021-12-11 DIAGNOSIS — H353221 Exudative age-related macular degeneration, left eye, with active choroidal neovascularization: Secondary | ICD-10-CM | POA: Diagnosis not present

## 2021-12-11 MED ORDER — LOSARTAN POTASSIUM-HCTZ 100-25 MG PO TABS: 1.0000 | ORAL_TABLET | Freq: Every day | ORAL | 1 refills | Status: DC

## 2021-12-11 MED ORDER — ATENOLOL 25 MG PO TABS: 25.0000 mg | ORAL_TABLET | Freq: Every day | ORAL | 1 refills | Status: DC

## 2021-12-11 MED ORDER — LEVOTHYROXINE SODIUM 25 MCG PO TABS: 25.0000 ug | ORAL_TABLET | Freq: Every day | ORAL | 1 refills | Status: DC

## 2021-12-12 DIAGNOSIS — Z85828 Personal history of other malignant neoplasm of skin: Secondary | ICD-10-CM | POA: Diagnosis not present

## 2021-12-12 DIAGNOSIS — M4807 Spinal stenosis, lumbosacral region: Secondary | ICD-10-CM | POA: Diagnosis not present

## 2021-12-12 DIAGNOSIS — R2689 Other abnormalities of gait and mobility: Secondary | ICD-10-CM | POA: Diagnosis not present

## 2021-12-12 DIAGNOSIS — L570 Actinic keratosis: Secondary | ICD-10-CM | POA: Diagnosis not present

## 2021-12-12 DIAGNOSIS — M5187 Other intervertebral disc disorders, lumbosacral region: Secondary | ICD-10-CM | POA: Diagnosis not present

## 2021-12-12 DIAGNOSIS — M25652 Stiffness of left hip, not elsewhere classified: Secondary | ICD-10-CM | POA: Diagnosis not present

## 2021-12-12 DIAGNOSIS — L821 Other seborrheic keratosis: Secondary | ICD-10-CM | POA: Diagnosis not present

## 2021-12-12 DIAGNOSIS — M25651 Stiffness of right hip, not elsewhere classified: Secondary | ICD-10-CM | POA: Diagnosis not present

## 2021-12-14 DIAGNOSIS — M4807 Spinal stenosis, lumbosacral region: Secondary | ICD-10-CM | POA: Diagnosis not present

## 2021-12-14 DIAGNOSIS — M25651 Stiffness of right hip, not elsewhere classified: Secondary | ICD-10-CM | POA: Diagnosis not present

## 2021-12-14 DIAGNOSIS — M5187 Other intervertebral disc disorders, lumbosacral region: Secondary | ICD-10-CM | POA: Diagnosis not present

## 2021-12-14 DIAGNOSIS — M25652 Stiffness of left hip, not elsewhere classified: Secondary | ICD-10-CM | POA: Diagnosis not present

## 2021-12-14 DIAGNOSIS — R2689 Other abnormalities of gait and mobility: Secondary | ICD-10-CM | POA: Diagnosis not present

## 2021-12-18 DIAGNOSIS — M4807 Spinal stenosis, lumbosacral region: Secondary | ICD-10-CM | POA: Diagnosis not present

## 2021-12-18 DIAGNOSIS — M25651 Stiffness of right hip, not elsewhere classified: Secondary | ICD-10-CM | POA: Diagnosis not present

## 2021-12-18 DIAGNOSIS — M5187 Other intervertebral disc disorders, lumbosacral region: Secondary | ICD-10-CM | POA: Diagnosis not present

## 2021-12-18 DIAGNOSIS — R2689 Other abnormalities of gait and mobility: Secondary | ICD-10-CM | POA: Diagnosis not present

## 2021-12-18 DIAGNOSIS — M25652 Stiffness of left hip, not elsewhere classified: Secondary | ICD-10-CM | POA: Diagnosis not present

## 2021-12-21 DIAGNOSIS — M5187 Other intervertebral disc disorders, lumbosacral region: Secondary | ICD-10-CM | POA: Diagnosis not present

## 2021-12-21 DIAGNOSIS — R2689 Other abnormalities of gait and mobility: Secondary | ICD-10-CM | POA: Diagnosis not present

## 2021-12-21 DIAGNOSIS — M25651 Stiffness of right hip, not elsewhere classified: Secondary | ICD-10-CM | POA: Diagnosis not present

## 2021-12-21 DIAGNOSIS — M4807 Spinal stenosis, lumbosacral region: Secondary | ICD-10-CM | POA: Diagnosis not present

## 2021-12-21 DIAGNOSIS — M25652 Stiffness of left hip, not elsewhere classified: Secondary | ICD-10-CM | POA: Diagnosis not present

## 2021-12-25 DIAGNOSIS — M25651 Stiffness of right hip, not elsewhere classified: Secondary | ICD-10-CM | POA: Diagnosis not present

## 2021-12-25 DIAGNOSIS — M25652 Stiffness of left hip, not elsewhere classified: Secondary | ICD-10-CM | POA: Diagnosis not present

## 2021-12-25 DIAGNOSIS — R2689 Other abnormalities of gait and mobility: Secondary | ICD-10-CM | POA: Diagnosis not present

## 2021-12-25 DIAGNOSIS — M5187 Other intervertebral disc disorders, lumbosacral region: Secondary | ICD-10-CM | POA: Diagnosis not present

## 2021-12-25 DIAGNOSIS — M4807 Spinal stenosis, lumbosacral region: Secondary | ICD-10-CM | POA: Diagnosis not present

## 2021-12-28 DIAGNOSIS — M5187 Other intervertebral disc disorders, lumbosacral region: Secondary | ICD-10-CM | POA: Diagnosis not present

## 2021-12-28 DIAGNOSIS — M4807 Spinal stenosis, lumbosacral region: Secondary | ICD-10-CM | POA: Diagnosis not present

## 2021-12-28 DIAGNOSIS — M25651 Stiffness of right hip, not elsewhere classified: Secondary | ICD-10-CM | POA: Diagnosis not present

## 2021-12-28 DIAGNOSIS — M25652 Stiffness of left hip, not elsewhere classified: Secondary | ICD-10-CM | POA: Diagnosis not present

## 2021-12-28 DIAGNOSIS — R2689 Other abnormalities of gait and mobility: Secondary | ICD-10-CM | POA: Diagnosis not present

## 2022-01-01 DIAGNOSIS — M25652 Stiffness of left hip, not elsewhere classified: Secondary | ICD-10-CM | POA: Diagnosis not present

## 2022-01-01 DIAGNOSIS — R2689 Other abnormalities of gait and mobility: Secondary | ICD-10-CM | POA: Diagnosis not present

## 2022-01-01 DIAGNOSIS — M25651 Stiffness of right hip, not elsewhere classified: Secondary | ICD-10-CM | POA: Diagnosis not present

## 2022-01-01 DIAGNOSIS — M5187 Other intervertebral disc disorders, lumbosacral region: Secondary | ICD-10-CM | POA: Diagnosis not present

## 2022-01-01 DIAGNOSIS — M4807 Spinal stenosis, lumbosacral region: Secondary | ICD-10-CM | POA: Diagnosis not present

## 2022-01-02 ENCOUNTER — Encounter: Payer: Self-pay | Admitting: Internal Medicine

## 2022-01-04 DIAGNOSIS — M25651 Stiffness of right hip, not elsewhere classified: Secondary | ICD-10-CM | POA: Diagnosis not present

## 2022-01-04 DIAGNOSIS — M5187 Other intervertebral disc disorders, lumbosacral region: Secondary | ICD-10-CM | POA: Diagnosis not present

## 2022-01-04 DIAGNOSIS — M4807 Spinal stenosis, lumbosacral region: Secondary | ICD-10-CM | POA: Diagnosis not present

## 2022-01-04 DIAGNOSIS — M25652 Stiffness of left hip, not elsewhere classified: Secondary | ICD-10-CM | POA: Diagnosis not present

## 2022-01-04 DIAGNOSIS — R2689 Other abnormalities of gait and mobility: Secondary | ICD-10-CM | POA: Diagnosis not present

## 2022-01-08 DIAGNOSIS — M4807 Spinal stenosis, lumbosacral region: Secondary | ICD-10-CM | POA: Diagnosis not present

## 2022-01-08 DIAGNOSIS — M25651 Stiffness of right hip, not elsewhere classified: Secondary | ICD-10-CM | POA: Diagnosis not present

## 2022-01-08 DIAGNOSIS — R2689 Other abnormalities of gait and mobility: Secondary | ICD-10-CM | POA: Diagnosis not present

## 2022-01-08 DIAGNOSIS — M25652 Stiffness of left hip, not elsewhere classified: Secondary | ICD-10-CM | POA: Diagnosis not present

## 2022-01-08 DIAGNOSIS — M5187 Other intervertebral disc disorders, lumbosacral region: Secondary | ICD-10-CM | POA: Diagnosis not present

## 2022-01-11 DIAGNOSIS — M25651 Stiffness of right hip, not elsewhere classified: Secondary | ICD-10-CM | POA: Diagnosis not present

## 2022-01-11 DIAGNOSIS — M4807 Spinal stenosis, lumbosacral region: Secondary | ICD-10-CM | POA: Diagnosis not present

## 2022-01-11 DIAGNOSIS — M5187 Other intervertebral disc disorders, lumbosacral region: Secondary | ICD-10-CM | POA: Diagnosis not present

## 2022-01-11 DIAGNOSIS — M25652 Stiffness of left hip, not elsewhere classified: Secondary | ICD-10-CM | POA: Diagnosis not present

## 2022-01-11 DIAGNOSIS — R2689 Other abnormalities of gait and mobility: Secondary | ICD-10-CM | POA: Diagnosis not present

## 2022-01-15 DIAGNOSIS — R2689 Other abnormalities of gait and mobility: Secondary | ICD-10-CM | POA: Diagnosis not present

## 2022-01-15 DIAGNOSIS — M5187 Other intervertebral disc disorders, lumbosacral region: Secondary | ICD-10-CM | POA: Diagnosis not present

## 2022-01-15 DIAGNOSIS — M25651 Stiffness of right hip, not elsewhere classified: Secondary | ICD-10-CM | POA: Diagnosis not present

## 2022-01-15 DIAGNOSIS — M25652 Stiffness of left hip, not elsewhere classified: Secondary | ICD-10-CM | POA: Diagnosis not present

## 2022-01-15 DIAGNOSIS — M4807 Spinal stenosis, lumbosacral region: Secondary | ICD-10-CM | POA: Diagnosis not present

## 2022-01-18 DIAGNOSIS — R2689 Other abnormalities of gait and mobility: Secondary | ICD-10-CM | POA: Diagnosis not present

## 2022-01-18 DIAGNOSIS — M5187 Other intervertebral disc disorders, lumbosacral region: Secondary | ICD-10-CM | POA: Diagnosis not present

## 2022-01-18 DIAGNOSIS — M25651 Stiffness of right hip, not elsewhere classified: Secondary | ICD-10-CM | POA: Diagnosis not present

## 2022-01-18 DIAGNOSIS — M4807 Spinal stenosis, lumbosacral region: Secondary | ICD-10-CM | POA: Diagnosis not present

## 2022-01-18 DIAGNOSIS — M25652 Stiffness of left hip, not elsewhere classified: Secondary | ICD-10-CM | POA: Diagnosis not present

## 2022-01-22 DIAGNOSIS — M4807 Spinal stenosis, lumbosacral region: Secondary | ICD-10-CM | POA: Diagnosis not present

## 2022-01-22 DIAGNOSIS — R2689 Other abnormalities of gait and mobility: Secondary | ICD-10-CM | POA: Diagnosis not present

## 2022-01-22 DIAGNOSIS — M5187 Other intervertebral disc disorders, lumbosacral region: Secondary | ICD-10-CM | POA: Diagnosis not present

## 2022-01-22 DIAGNOSIS — M25651 Stiffness of right hip, not elsewhere classified: Secondary | ICD-10-CM | POA: Diagnosis not present

## 2022-01-22 DIAGNOSIS — M25652 Stiffness of left hip, not elsewhere classified: Secondary | ICD-10-CM | POA: Diagnosis not present

## 2022-01-25 DIAGNOSIS — M25651 Stiffness of right hip, not elsewhere classified: Secondary | ICD-10-CM | POA: Diagnosis not present

## 2022-01-25 DIAGNOSIS — M25652 Stiffness of left hip, not elsewhere classified: Secondary | ICD-10-CM | POA: Diagnosis not present

## 2022-01-25 DIAGNOSIS — M5187 Other intervertebral disc disorders, lumbosacral region: Secondary | ICD-10-CM | POA: Diagnosis not present

## 2022-01-25 DIAGNOSIS — R2689 Other abnormalities of gait and mobility: Secondary | ICD-10-CM | POA: Diagnosis not present

## 2022-01-25 DIAGNOSIS — M4807 Spinal stenosis, lumbosacral region: Secondary | ICD-10-CM | POA: Diagnosis not present

## 2022-01-29 DIAGNOSIS — R2689 Other abnormalities of gait and mobility: Secondary | ICD-10-CM | POA: Diagnosis not present

## 2022-01-29 DIAGNOSIS — M25651 Stiffness of right hip, not elsewhere classified: Secondary | ICD-10-CM | POA: Diagnosis not present

## 2022-01-29 DIAGNOSIS — M5187 Other intervertebral disc disorders, lumbosacral region: Secondary | ICD-10-CM | POA: Diagnosis not present

## 2022-01-29 DIAGNOSIS — M4807 Spinal stenosis, lumbosacral region: Secondary | ICD-10-CM | POA: Diagnosis not present

## 2022-01-29 DIAGNOSIS — M25652 Stiffness of left hip, not elsewhere classified: Secondary | ICD-10-CM | POA: Diagnosis not present

## 2022-01-31 ENCOUNTER — Other Ambulatory Visit: Payer: Medicare Other

## 2022-01-31 IMAGING — DX DG CHEST 2V
2 series · 2 of 2 positions shown · non-contrast
Comparison: 01/21/2021

CLINICAL DATA: Multifocal pneumonia, improving cough

EXAM:
CHEST - 2 VIEW

[dg chest 2 view (1 of 2)]
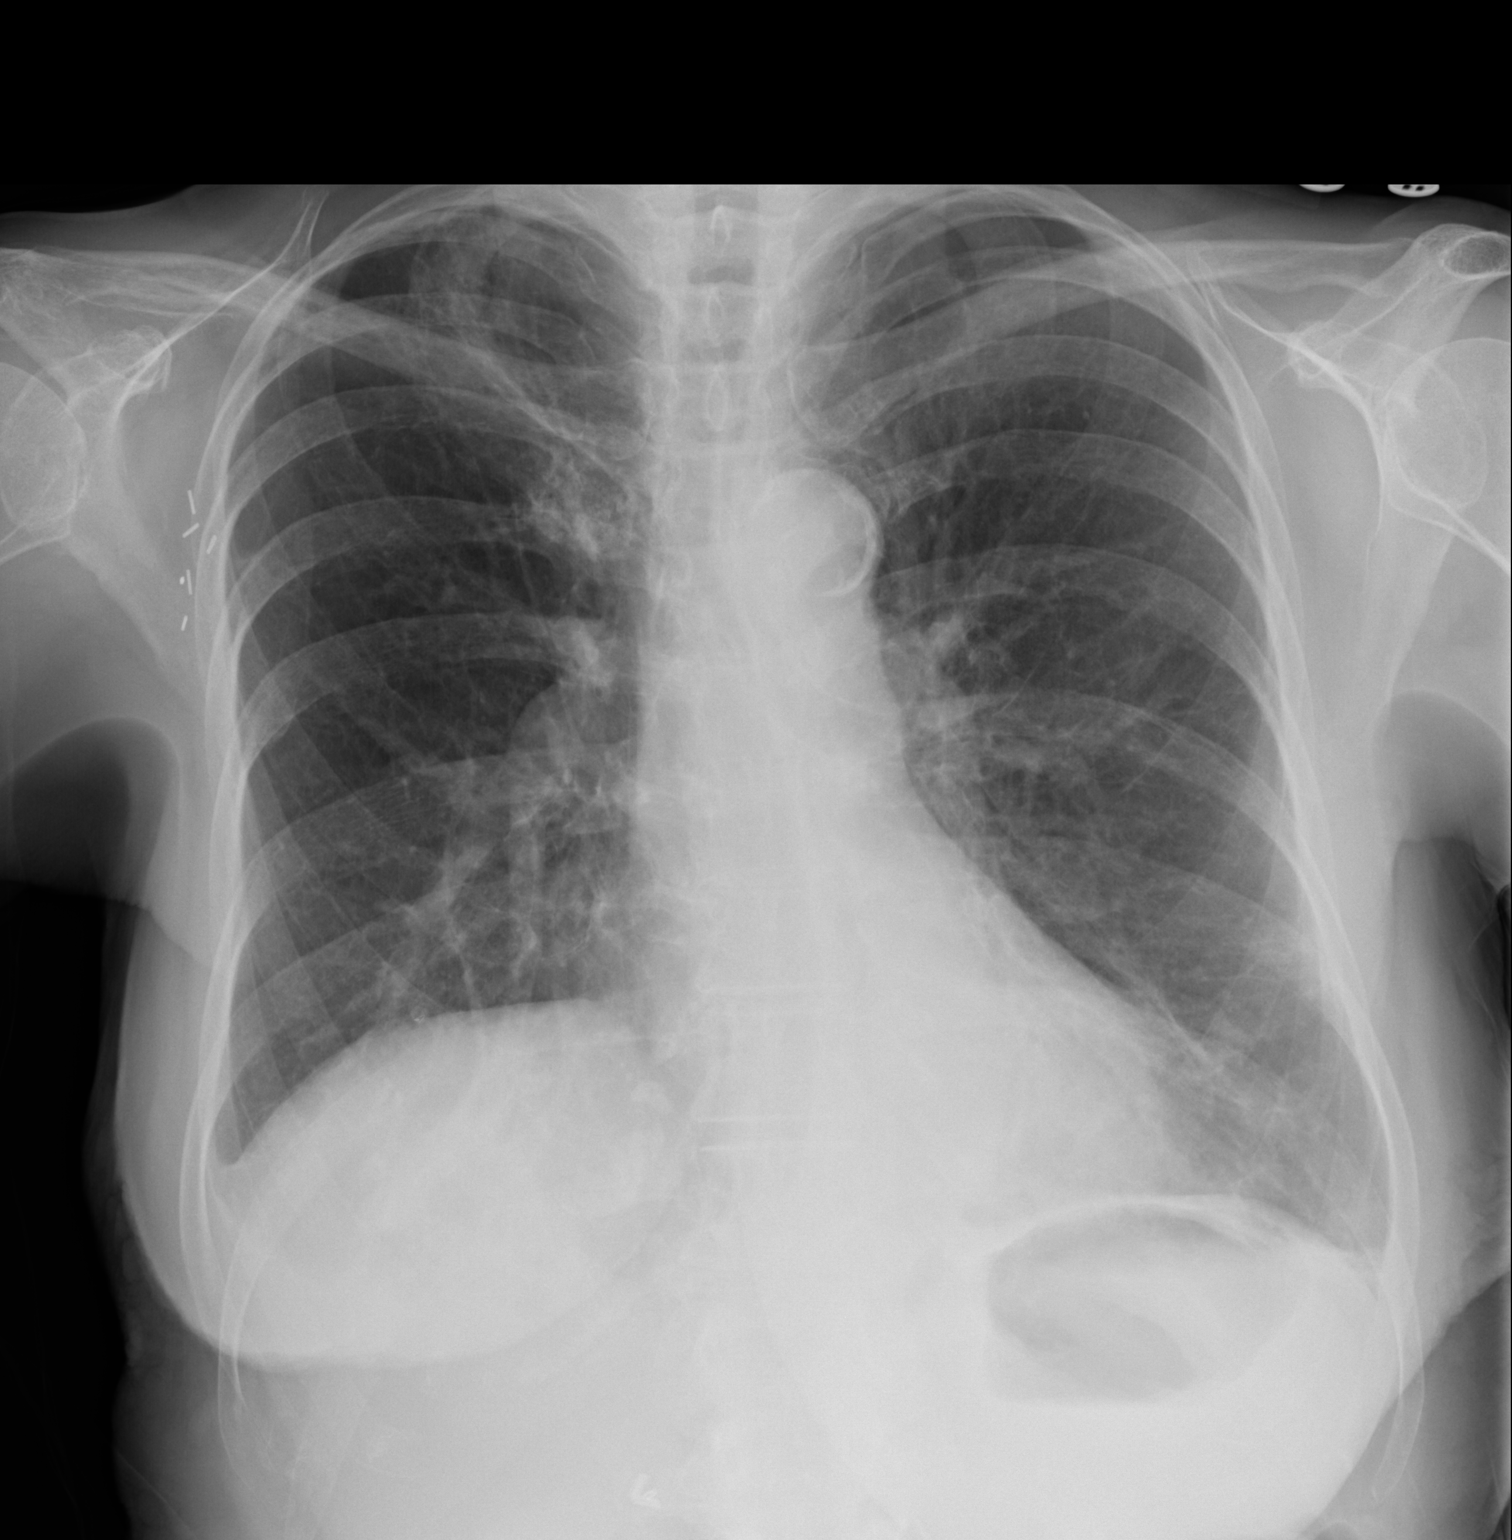

[dg chest 2 view (2 of 2)]
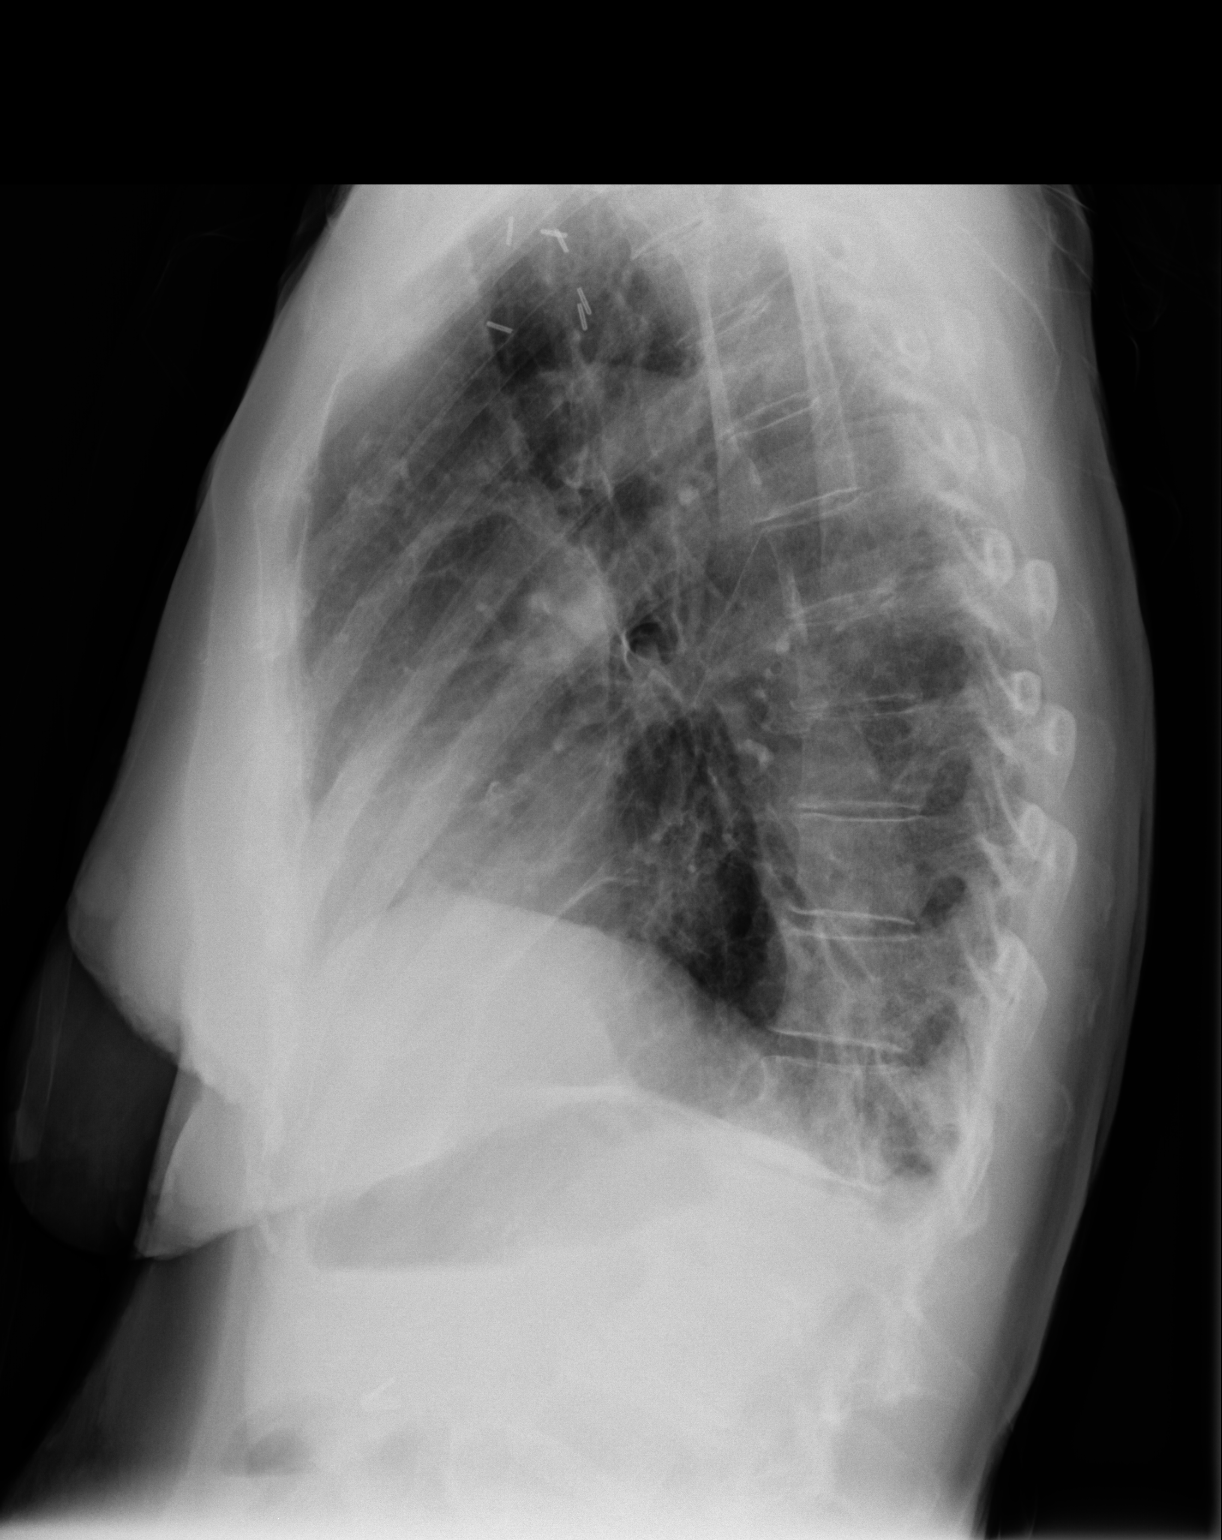

[2 of 2 positions shown; findings below may reference images not displayed]

FINDINGS: Normal heart size, mediastinal contours, and pulmonary vascularity.

Atherosclerotic calcification aorta.

Lungs mildly hyperinflated with RIGHT apex scarring.

Significantly improved BILATERAL pulmonary infiltrates since
previous exam with minimal residual at LEFT base.

Mild atelectasis versus scarring at RIGHT base.

No pleural effusion or pneumothorax.

Osseous structures unremarkable.
IMPRESSION: Significantly improved pulmonary infiltrates since previous exam.

Aortic Atherosclerosis (6NFBF-GPD.D)

## 2022-02-01 DIAGNOSIS — M4807 Spinal stenosis, lumbosacral region: Secondary | ICD-10-CM | POA: Diagnosis not present

## 2022-02-01 DIAGNOSIS — R2689 Other abnormalities of gait and mobility: Secondary | ICD-10-CM | POA: Diagnosis not present

## 2022-02-01 DIAGNOSIS — M25652 Stiffness of left hip, not elsewhere classified: Secondary | ICD-10-CM | POA: Diagnosis not present

## 2022-02-01 DIAGNOSIS — M5187 Other intervertebral disc disorders, lumbosacral region: Secondary | ICD-10-CM | POA: Diagnosis not present

## 2022-02-01 DIAGNOSIS — M25651 Stiffness of right hip, not elsewhere classified: Secondary | ICD-10-CM | POA: Diagnosis not present

## 2022-02-05 ENCOUNTER — Other Ambulatory Visit (INDEPENDENT_AMBULATORY_CARE_PROVIDER_SITE_OTHER): Payer: Medicare Other

## 2022-02-05 DIAGNOSIS — Z23 Encounter for immunization: Secondary | ICD-10-CM | POA: Diagnosis not present

## 2022-02-08 DIAGNOSIS — M25652 Stiffness of left hip, not elsewhere classified: Secondary | ICD-10-CM | POA: Diagnosis not present

## 2022-02-08 DIAGNOSIS — M4807 Spinal stenosis, lumbosacral region: Secondary | ICD-10-CM | POA: Diagnosis not present

## 2022-02-08 DIAGNOSIS — M25651 Stiffness of right hip, not elsewhere classified: Secondary | ICD-10-CM | POA: Diagnosis not present

## 2022-02-08 DIAGNOSIS — M5187 Other intervertebral disc disorders, lumbosacral region: Secondary | ICD-10-CM | POA: Diagnosis not present

## 2022-02-08 DIAGNOSIS — R2689 Other abnormalities of gait and mobility: Secondary | ICD-10-CM | POA: Diagnosis not present

## 2022-02-12 DIAGNOSIS — R2689 Other abnormalities of gait and mobility: Secondary | ICD-10-CM | POA: Diagnosis not present

## 2022-02-12 DIAGNOSIS — M25652 Stiffness of left hip, not elsewhere classified: Secondary | ICD-10-CM | POA: Diagnosis not present

## 2022-02-12 DIAGNOSIS — M5187 Other intervertebral disc disorders, lumbosacral region: Secondary | ICD-10-CM | POA: Diagnosis not present

## 2022-02-12 DIAGNOSIS — M4807 Spinal stenosis, lumbosacral region: Secondary | ICD-10-CM | POA: Diagnosis not present

## 2022-02-12 DIAGNOSIS — M25651 Stiffness of right hip, not elsewhere classified: Secondary | ICD-10-CM | POA: Diagnosis not present

## 2022-02-14 DIAGNOSIS — M5187 Other intervertebral disc disorders, lumbosacral region: Secondary | ICD-10-CM | POA: Diagnosis not present

## 2022-02-14 DIAGNOSIS — M25651 Stiffness of right hip, not elsewhere classified: Secondary | ICD-10-CM | POA: Diagnosis not present

## 2022-02-14 DIAGNOSIS — M4807 Spinal stenosis, lumbosacral region: Secondary | ICD-10-CM | POA: Diagnosis not present

## 2022-02-14 DIAGNOSIS — M25652 Stiffness of left hip, not elsewhere classified: Secondary | ICD-10-CM | POA: Diagnosis not present

## 2022-02-14 DIAGNOSIS — R2689 Other abnormalities of gait and mobility: Secondary | ICD-10-CM | POA: Diagnosis not present

## 2022-02-19 DIAGNOSIS — M25651 Stiffness of right hip, not elsewhere classified: Secondary | ICD-10-CM | POA: Diagnosis not present

## 2022-02-19 DIAGNOSIS — M5187 Other intervertebral disc disorders, lumbosacral region: Secondary | ICD-10-CM | POA: Diagnosis not present

## 2022-02-19 DIAGNOSIS — M4807 Spinal stenosis, lumbosacral region: Secondary | ICD-10-CM | POA: Diagnosis not present

## 2022-02-19 DIAGNOSIS — M25652 Stiffness of left hip, not elsewhere classified: Secondary | ICD-10-CM | POA: Diagnosis not present

## 2022-02-19 DIAGNOSIS — R2689 Other abnormalities of gait and mobility: Secondary | ICD-10-CM | POA: Diagnosis not present

## 2022-02-20 ENCOUNTER — Other Ambulatory Visit: Payer: Self-pay | Admitting: Family Medicine

## 2022-02-22 DIAGNOSIS — M25652 Stiffness of left hip, not elsewhere classified: Secondary | ICD-10-CM | POA: Diagnosis not present

## 2022-02-22 DIAGNOSIS — R2689 Other abnormalities of gait and mobility: Secondary | ICD-10-CM | POA: Diagnosis not present

## 2022-02-22 DIAGNOSIS — M25651 Stiffness of right hip, not elsewhere classified: Secondary | ICD-10-CM | POA: Diagnosis not present

## 2022-02-22 DIAGNOSIS — M5187 Other intervertebral disc disorders, lumbosacral region: Secondary | ICD-10-CM | POA: Diagnosis not present

## 2022-02-22 DIAGNOSIS — M4807 Spinal stenosis, lumbosacral region: Secondary | ICD-10-CM | POA: Diagnosis not present

## 2022-02-26 DIAGNOSIS — M25651 Stiffness of right hip, not elsewhere classified: Secondary | ICD-10-CM | POA: Diagnosis not present

## 2022-02-26 DIAGNOSIS — M4807 Spinal stenosis, lumbosacral region: Secondary | ICD-10-CM | POA: Diagnosis not present

## 2022-02-26 DIAGNOSIS — M25652 Stiffness of left hip, not elsewhere classified: Secondary | ICD-10-CM | POA: Diagnosis not present

## 2022-02-26 DIAGNOSIS — R2689 Other abnormalities of gait and mobility: Secondary | ICD-10-CM | POA: Diagnosis not present

## 2022-02-26 DIAGNOSIS — M5187 Other intervertebral disc disorders, lumbosacral region: Secondary | ICD-10-CM | POA: Diagnosis not present

## 2022-03-01 DIAGNOSIS — M25651 Stiffness of right hip, not elsewhere classified: Secondary | ICD-10-CM | POA: Diagnosis not present

## 2022-03-01 DIAGNOSIS — R2689 Other abnormalities of gait and mobility: Secondary | ICD-10-CM | POA: Diagnosis not present

## 2022-03-01 DIAGNOSIS — M5187 Other intervertebral disc disorders, lumbosacral region: Secondary | ICD-10-CM | POA: Diagnosis not present

## 2022-03-01 DIAGNOSIS — M25652 Stiffness of left hip, not elsewhere classified: Secondary | ICD-10-CM | POA: Diagnosis not present

## 2022-03-01 DIAGNOSIS — M4807 Spinal stenosis, lumbosacral region: Secondary | ICD-10-CM | POA: Diagnosis not present

## 2022-03-05 DIAGNOSIS — R2689 Other abnormalities of gait and mobility: Secondary | ICD-10-CM | POA: Diagnosis not present

## 2022-03-05 DIAGNOSIS — M5187 Other intervertebral disc disorders, lumbosacral region: Secondary | ICD-10-CM | POA: Diagnosis not present

## 2022-03-05 DIAGNOSIS — M4807 Spinal stenosis, lumbosacral region: Secondary | ICD-10-CM | POA: Diagnosis not present

## 2022-03-05 DIAGNOSIS — M25652 Stiffness of left hip, not elsewhere classified: Secondary | ICD-10-CM | POA: Diagnosis not present

## 2022-03-05 DIAGNOSIS — M25651 Stiffness of right hip, not elsewhere classified: Secondary | ICD-10-CM | POA: Diagnosis not present

## 2022-03-08 DIAGNOSIS — M25652 Stiffness of left hip, not elsewhere classified: Secondary | ICD-10-CM | POA: Diagnosis not present

## 2022-03-08 DIAGNOSIS — M25651 Stiffness of right hip, not elsewhere classified: Secondary | ICD-10-CM | POA: Diagnosis not present

## 2022-03-08 DIAGNOSIS — M4807 Spinal stenosis, lumbosacral region: Secondary | ICD-10-CM | POA: Diagnosis not present

## 2022-03-08 DIAGNOSIS — R2689 Other abnormalities of gait and mobility: Secondary | ICD-10-CM | POA: Diagnosis not present

## 2022-03-08 DIAGNOSIS — M5187 Other intervertebral disc disorders, lumbosacral region: Secondary | ICD-10-CM | POA: Diagnosis not present

## 2022-03-12 DIAGNOSIS — M25651 Stiffness of right hip, not elsewhere classified: Secondary | ICD-10-CM | POA: Diagnosis not present

## 2022-03-12 DIAGNOSIS — R2689 Other abnormalities of gait and mobility: Secondary | ICD-10-CM | POA: Diagnosis not present

## 2022-03-12 DIAGNOSIS — M25652 Stiffness of left hip, not elsewhere classified: Secondary | ICD-10-CM | POA: Diagnosis not present

## 2022-03-12 DIAGNOSIS — M4807 Spinal stenosis, lumbosacral region: Secondary | ICD-10-CM | POA: Diagnosis not present

## 2022-03-12 DIAGNOSIS — M5187 Other intervertebral disc disorders, lumbosacral region: Secondary | ICD-10-CM | POA: Diagnosis not present

## 2022-03-15 DIAGNOSIS — M4807 Spinal stenosis, lumbosacral region: Secondary | ICD-10-CM | POA: Diagnosis not present

## 2022-03-15 DIAGNOSIS — M25651 Stiffness of right hip, not elsewhere classified: Secondary | ICD-10-CM | POA: Diagnosis not present

## 2022-03-15 DIAGNOSIS — M5187 Other intervertebral disc disorders, lumbosacral region: Secondary | ICD-10-CM | POA: Diagnosis not present

## 2022-03-15 DIAGNOSIS — M25652 Stiffness of left hip, not elsewhere classified: Secondary | ICD-10-CM | POA: Diagnosis not present

## 2022-03-15 DIAGNOSIS — R2689 Other abnormalities of gait and mobility: Secondary | ICD-10-CM | POA: Diagnosis not present

## 2022-03-21 DIAGNOSIS — M4807 Spinal stenosis, lumbosacral region: Secondary | ICD-10-CM | POA: Diagnosis not present

## 2022-03-21 DIAGNOSIS — R2689 Other abnormalities of gait and mobility: Secondary | ICD-10-CM | POA: Diagnosis not present

## 2022-03-21 DIAGNOSIS — M25652 Stiffness of left hip, not elsewhere classified: Secondary | ICD-10-CM | POA: Diagnosis not present

## 2022-03-21 DIAGNOSIS — M5187 Other intervertebral disc disorders, lumbosacral region: Secondary | ICD-10-CM | POA: Diagnosis not present

## 2022-03-21 DIAGNOSIS — M25651 Stiffness of right hip, not elsewhere classified: Secondary | ICD-10-CM | POA: Diagnosis not present

## 2022-03-23 DIAGNOSIS — M4807 Spinal stenosis, lumbosacral region: Secondary | ICD-10-CM | POA: Diagnosis not present

## 2022-03-23 DIAGNOSIS — M25652 Stiffness of left hip, not elsewhere classified: Secondary | ICD-10-CM | POA: Diagnosis not present

## 2022-03-23 DIAGNOSIS — M25651 Stiffness of right hip, not elsewhere classified: Secondary | ICD-10-CM | POA: Diagnosis not present

## 2022-03-23 DIAGNOSIS — M5187 Other intervertebral disc disorders, lumbosacral region: Secondary | ICD-10-CM | POA: Diagnosis not present

## 2022-03-23 DIAGNOSIS — R2689 Other abnormalities of gait and mobility: Secondary | ICD-10-CM | POA: Diagnosis not present

## 2022-03-27 ENCOUNTER — Other Ambulatory Visit: Payer: Self-pay | Admitting: Family Medicine

## 2022-03-27 DIAGNOSIS — M25651 Stiffness of right hip, not elsewhere classified: Secondary | ICD-10-CM | POA: Diagnosis not present

## 2022-03-27 DIAGNOSIS — Z1231 Encounter for screening mammogram for malignant neoplasm of breast: Secondary | ICD-10-CM

## 2022-03-27 DIAGNOSIS — R2689 Other abnormalities of gait and mobility: Secondary | ICD-10-CM | POA: Diagnosis not present

## 2022-03-27 DIAGNOSIS — M5187 Other intervertebral disc disorders, lumbosacral region: Secondary | ICD-10-CM | POA: Diagnosis not present

## 2022-03-27 DIAGNOSIS — M25652 Stiffness of left hip, not elsewhere classified: Secondary | ICD-10-CM | POA: Diagnosis not present

## 2022-03-27 DIAGNOSIS — M4807 Spinal stenosis, lumbosacral region: Secondary | ICD-10-CM | POA: Diagnosis not present

## 2022-03-29 DIAGNOSIS — M25651 Stiffness of right hip, not elsewhere classified: Secondary | ICD-10-CM | POA: Diagnosis not present

## 2022-03-29 DIAGNOSIS — M5187 Other intervertebral disc disorders, lumbosacral region: Secondary | ICD-10-CM | POA: Diagnosis not present

## 2022-03-29 DIAGNOSIS — M4807 Spinal stenosis, lumbosacral region: Secondary | ICD-10-CM | POA: Diagnosis not present

## 2022-03-29 DIAGNOSIS — M25652 Stiffness of left hip, not elsewhere classified: Secondary | ICD-10-CM | POA: Diagnosis not present

## 2022-03-29 DIAGNOSIS — R2689 Other abnormalities of gait and mobility: Secondary | ICD-10-CM | POA: Diagnosis not present

## 2022-04-02 DIAGNOSIS — M4807 Spinal stenosis, lumbosacral region: Secondary | ICD-10-CM | POA: Diagnosis not present

## 2022-04-02 DIAGNOSIS — M25612 Stiffness of left shoulder, not elsewhere classified: Secondary | ICD-10-CM | POA: Diagnosis not present

## 2022-04-02 DIAGNOSIS — M5187 Other intervertebral disc disorders, lumbosacral region: Secondary | ICD-10-CM | POA: Diagnosis not present

## 2022-04-02 DIAGNOSIS — M25512 Pain in left shoulder: Secondary | ICD-10-CM | POA: Diagnosis not present

## 2022-04-02 DIAGNOSIS — R2689 Other abnormalities of gait and mobility: Secondary | ICD-10-CM | POA: Diagnosis not present

## 2022-04-05 DIAGNOSIS — R2689 Other abnormalities of gait and mobility: Secondary | ICD-10-CM | POA: Diagnosis not present

## 2022-04-05 DIAGNOSIS — M25651 Stiffness of right hip, not elsewhere classified: Secondary | ICD-10-CM | POA: Diagnosis not present

## 2022-04-05 DIAGNOSIS — M5187 Other intervertebral disc disorders, lumbosacral region: Secondary | ICD-10-CM | POA: Diagnosis not present

## 2022-04-05 DIAGNOSIS — M4807 Spinal stenosis, lumbosacral region: Secondary | ICD-10-CM | POA: Diagnosis not present

## 2022-04-05 DIAGNOSIS — M25652 Stiffness of left hip, not elsewhere classified: Secondary | ICD-10-CM | POA: Diagnosis not present

## 2022-04-12 DIAGNOSIS — M25651 Stiffness of right hip, not elsewhere classified: Secondary | ICD-10-CM | POA: Diagnosis not present

## 2022-04-12 DIAGNOSIS — R2689 Other abnormalities of gait and mobility: Secondary | ICD-10-CM | POA: Diagnosis not present

## 2022-04-12 DIAGNOSIS — M4807 Spinal stenosis, lumbosacral region: Secondary | ICD-10-CM | POA: Diagnosis not present

## 2022-04-12 DIAGNOSIS — M5187 Other intervertebral disc disorders, lumbosacral region: Secondary | ICD-10-CM | POA: Diagnosis not present

## 2022-04-12 DIAGNOSIS — M25652 Stiffness of left hip, not elsewhere classified: Secondary | ICD-10-CM | POA: Diagnosis not present

## 2022-04-16 DIAGNOSIS — M4807 Spinal stenosis, lumbosacral region: Secondary | ICD-10-CM | POA: Diagnosis not present

## 2022-04-16 DIAGNOSIS — M25651 Stiffness of right hip, not elsewhere classified: Secondary | ICD-10-CM | POA: Diagnosis not present

## 2022-04-16 DIAGNOSIS — R2689 Other abnormalities of gait and mobility: Secondary | ICD-10-CM | POA: Diagnosis not present

## 2022-04-16 DIAGNOSIS — M25652 Stiffness of left hip, not elsewhere classified: Secondary | ICD-10-CM | POA: Diagnosis not present

## 2022-04-16 DIAGNOSIS — M5187 Other intervertebral disc disorders, lumbosacral region: Secondary | ICD-10-CM | POA: Diagnosis not present

## 2022-04-19 DIAGNOSIS — M25652 Stiffness of left hip, not elsewhere classified: Secondary | ICD-10-CM | POA: Diagnosis not present

## 2022-04-19 DIAGNOSIS — M25651 Stiffness of right hip, not elsewhere classified: Secondary | ICD-10-CM | POA: Diagnosis not present

## 2022-04-19 DIAGNOSIS — R2689 Other abnormalities of gait and mobility: Secondary | ICD-10-CM | POA: Diagnosis not present

## 2022-04-19 DIAGNOSIS — M4807 Spinal stenosis, lumbosacral region: Secondary | ICD-10-CM | POA: Diagnosis not present

## 2022-04-19 DIAGNOSIS — M5187 Other intervertebral disc disorders, lumbosacral region: Secondary | ICD-10-CM | POA: Diagnosis not present

## 2022-04-23 DIAGNOSIS — M5187 Other intervertebral disc disorders, lumbosacral region: Secondary | ICD-10-CM | POA: Diagnosis not present

## 2022-04-23 DIAGNOSIS — M25652 Stiffness of left hip, not elsewhere classified: Secondary | ICD-10-CM | POA: Diagnosis not present

## 2022-04-23 DIAGNOSIS — R2689 Other abnormalities of gait and mobility: Secondary | ICD-10-CM | POA: Diagnosis not present

## 2022-04-23 DIAGNOSIS — M25651 Stiffness of right hip, not elsewhere classified: Secondary | ICD-10-CM | POA: Diagnosis not present

## 2022-04-23 DIAGNOSIS — M4807 Spinal stenosis, lumbosacral region: Secondary | ICD-10-CM | POA: Diagnosis not present

## 2022-04-26 DIAGNOSIS — R2689 Other abnormalities of gait and mobility: Secondary | ICD-10-CM | POA: Diagnosis not present

## 2022-04-26 DIAGNOSIS — M25651 Stiffness of right hip, not elsewhere classified: Secondary | ICD-10-CM | POA: Diagnosis not present

## 2022-04-26 DIAGNOSIS — M5187 Other intervertebral disc disorders, lumbosacral region: Secondary | ICD-10-CM | POA: Diagnosis not present

## 2022-04-26 DIAGNOSIS — M4807 Spinal stenosis, lumbosacral region: Secondary | ICD-10-CM | POA: Diagnosis not present

## 2022-04-26 DIAGNOSIS — M25652 Stiffness of left hip, not elsewhere classified: Secondary | ICD-10-CM | POA: Diagnosis not present

## 2022-04-30 DIAGNOSIS — M25652 Stiffness of left hip, not elsewhere classified: Secondary | ICD-10-CM | POA: Diagnosis not present

## 2022-04-30 DIAGNOSIS — M4807 Spinal stenosis, lumbosacral region: Secondary | ICD-10-CM | POA: Diagnosis not present

## 2022-04-30 DIAGNOSIS — R2689 Other abnormalities of gait and mobility: Secondary | ICD-10-CM | POA: Diagnosis not present

## 2022-04-30 DIAGNOSIS — M5187 Other intervertebral disc disorders, lumbosacral region: Secondary | ICD-10-CM | POA: Diagnosis not present

## 2022-04-30 DIAGNOSIS — M25651 Stiffness of right hip, not elsewhere classified: Secondary | ICD-10-CM | POA: Diagnosis not present

## 2022-05-03 DIAGNOSIS — M4807 Spinal stenosis, lumbosacral region: Secondary | ICD-10-CM | POA: Diagnosis not present

## 2022-05-03 DIAGNOSIS — M25652 Stiffness of left hip, not elsewhere classified: Secondary | ICD-10-CM | POA: Diagnosis not present

## 2022-05-03 DIAGNOSIS — M25651 Stiffness of right hip, not elsewhere classified: Secondary | ICD-10-CM | POA: Diagnosis not present

## 2022-05-03 DIAGNOSIS — M5187 Other intervertebral disc disorders, lumbosacral region: Secondary | ICD-10-CM | POA: Diagnosis not present

## 2022-05-03 DIAGNOSIS — R2689 Other abnormalities of gait and mobility: Secondary | ICD-10-CM | POA: Diagnosis not present

## 2022-05-08 DIAGNOSIS — R2689 Other abnormalities of gait and mobility: Secondary | ICD-10-CM | POA: Diagnosis not present

## 2022-05-08 DIAGNOSIS — M25652 Stiffness of left hip, not elsewhere classified: Secondary | ICD-10-CM | POA: Diagnosis not present

## 2022-05-08 DIAGNOSIS — M25651 Stiffness of right hip, not elsewhere classified: Secondary | ICD-10-CM | POA: Diagnosis not present

## 2022-05-08 DIAGNOSIS — M5187 Other intervertebral disc disorders, lumbosacral region: Secondary | ICD-10-CM | POA: Diagnosis not present

## 2022-05-08 DIAGNOSIS — M4807 Spinal stenosis, lumbosacral region: Secondary | ICD-10-CM | POA: Diagnosis not present

## 2022-05-10 DIAGNOSIS — R2689 Other abnormalities of gait and mobility: Secondary | ICD-10-CM | POA: Diagnosis not present

## 2022-05-10 DIAGNOSIS — M25651 Stiffness of right hip, not elsewhere classified: Secondary | ICD-10-CM | POA: Diagnosis not present

## 2022-05-10 DIAGNOSIS — M25652 Stiffness of left hip, not elsewhere classified: Secondary | ICD-10-CM | POA: Diagnosis not present

## 2022-05-10 DIAGNOSIS — M4807 Spinal stenosis, lumbosacral region: Secondary | ICD-10-CM | POA: Diagnosis not present

## 2022-05-10 DIAGNOSIS — M5187 Other intervertebral disc disorders, lumbosacral region: Secondary | ICD-10-CM | POA: Diagnosis not present

## 2022-05-14 DIAGNOSIS — M25651 Stiffness of right hip, not elsewhere classified: Secondary | ICD-10-CM | POA: Diagnosis not present

## 2022-05-14 DIAGNOSIS — M4807 Spinal stenosis, lumbosacral region: Secondary | ICD-10-CM | POA: Diagnosis not present

## 2022-05-14 DIAGNOSIS — M5187 Other intervertebral disc disorders, lumbosacral region: Secondary | ICD-10-CM | POA: Diagnosis not present

## 2022-05-14 DIAGNOSIS — M25652 Stiffness of left hip, not elsewhere classified: Secondary | ICD-10-CM | POA: Diagnosis not present

## 2022-05-14 DIAGNOSIS — R2689 Other abnormalities of gait and mobility: Secondary | ICD-10-CM | POA: Diagnosis not present

## 2022-05-16 ENCOUNTER — Ambulatory Visit
Admission: RE | Admit: 2022-05-16 | Discharge: 2022-05-16 | Disposition: A | Discharge status: Home / Self Care | Payer: Medicare Other | Source: Ambulatory Visit | Attending: Family Medicine | Admitting: Family Medicine

## 2022-05-16 DIAGNOSIS — Z1231 Encounter for screening mammogram for malignant neoplasm of breast: Secondary | ICD-10-CM | POA: Diagnosis not present

## 2022-05-18 DIAGNOSIS — M25652 Stiffness of left hip, not elsewhere classified: Secondary | ICD-10-CM | POA: Diagnosis not present

## 2022-05-18 DIAGNOSIS — R2689 Other abnormalities of gait and mobility: Secondary | ICD-10-CM | POA: Diagnosis not present

## 2022-05-18 DIAGNOSIS — M4807 Spinal stenosis, lumbosacral region: Secondary | ICD-10-CM | POA: Diagnosis not present

## 2022-05-18 DIAGNOSIS — M25651 Stiffness of right hip, not elsewhere classified: Secondary | ICD-10-CM | POA: Diagnosis not present

## 2022-05-18 DIAGNOSIS — M5187 Other intervertebral disc disorders, lumbosacral region: Secondary | ICD-10-CM | POA: Diagnosis not present

## 2022-05-22 DIAGNOSIS — M25651 Stiffness of right hip, not elsewhere classified: Secondary | ICD-10-CM | POA: Diagnosis not present

## 2022-05-22 DIAGNOSIS — R2689 Other abnormalities of gait and mobility: Secondary | ICD-10-CM | POA: Diagnosis not present

## 2022-05-22 DIAGNOSIS — M25652 Stiffness of left hip, not elsewhere classified: Secondary | ICD-10-CM | POA: Diagnosis not present

## 2022-05-22 DIAGNOSIS — M4807 Spinal stenosis, lumbosacral region: Secondary | ICD-10-CM | POA: Diagnosis not present

## 2022-05-22 DIAGNOSIS — M5187 Other intervertebral disc disorders, lumbosacral region: Secondary | ICD-10-CM | POA: Diagnosis not present

## 2022-05-24 DIAGNOSIS — Z961 Presence of intraocular lens: Secondary | ICD-10-CM | POA: Diagnosis not present

## 2022-05-24 DIAGNOSIS — H353221 Exudative age-related macular degeneration, left eye, with active choroidal neovascularization: Secondary | ICD-10-CM | POA: Diagnosis not present

## 2022-05-24 DIAGNOSIS — H26493 Other secondary cataract, bilateral: Secondary | ICD-10-CM | POA: Diagnosis not present

## 2022-05-24 DIAGNOSIS — H353112 Nonexudative age-related macular degeneration, right eye, intermediate dry stage: Secondary | ICD-10-CM | POA: Diagnosis not present

## 2022-05-24 DIAGNOSIS — D3132 Benign neoplasm of left choroid: Secondary | ICD-10-CM | POA: Diagnosis not present

## 2022-05-25 DIAGNOSIS — M25652 Stiffness of left hip, not elsewhere classified: Secondary | ICD-10-CM | POA: Diagnosis not present

## 2022-05-25 DIAGNOSIS — M4807 Spinal stenosis, lumbosacral region: Secondary | ICD-10-CM | POA: Diagnosis not present

## 2022-05-25 DIAGNOSIS — M25651 Stiffness of right hip, not elsewhere classified: Secondary | ICD-10-CM | POA: Diagnosis not present

## 2022-05-25 DIAGNOSIS — M5187 Other intervertebral disc disorders, lumbosacral region: Secondary | ICD-10-CM | POA: Diagnosis not present

## 2022-05-25 DIAGNOSIS — R2689 Other abnormalities of gait and mobility: Secondary | ICD-10-CM | POA: Diagnosis not present

## 2022-05-28 DIAGNOSIS — R2689 Other abnormalities of gait and mobility: Secondary | ICD-10-CM | POA: Diagnosis not present

## 2022-05-28 DIAGNOSIS — M25652 Stiffness of left hip, not elsewhere classified: Secondary | ICD-10-CM | POA: Diagnosis not present

## 2022-05-28 DIAGNOSIS — M4807 Spinal stenosis, lumbosacral region: Secondary | ICD-10-CM | POA: Diagnosis not present

## 2022-05-28 DIAGNOSIS — M25651 Stiffness of right hip, not elsewhere classified: Secondary | ICD-10-CM | POA: Diagnosis not present

## 2022-05-28 DIAGNOSIS — M5187 Other intervertebral disc disorders, lumbosacral region: Secondary | ICD-10-CM | POA: Diagnosis not present

## 2022-05-31 DIAGNOSIS — R2689 Other abnormalities of gait and mobility: Secondary | ICD-10-CM | POA: Diagnosis not present

## 2022-05-31 DIAGNOSIS — M25652 Stiffness of left hip, not elsewhere classified: Secondary | ICD-10-CM | POA: Diagnosis not present

## 2022-05-31 DIAGNOSIS — M25651 Stiffness of right hip, not elsewhere classified: Secondary | ICD-10-CM | POA: Diagnosis not present

## 2022-05-31 DIAGNOSIS — M5187 Other intervertebral disc disorders, lumbosacral region: Secondary | ICD-10-CM | POA: Diagnosis not present

## 2022-05-31 DIAGNOSIS — M4807 Spinal stenosis, lumbosacral region: Secondary | ICD-10-CM | POA: Diagnosis not present

## 2022-06-04 DIAGNOSIS — M5187 Other intervertebral disc disorders, lumbosacral region: Secondary | ICD-10-CM | POA: Diagnosis not present

## 2022-06-04 DIAGNOSIS — R2689 Other abnormalities of gait and mobility: Secondary | ICD-10-CM | POA: Diagnosis not present

## 2022-06-04 DIAGNOSIS — M25651 Stiffness of right hip, not elsewhere classified: Secondary | ICD-10-CM | POA: Diagnosis not present

## 2022-06-04 DIAGNOSIS — M25652 Stiffness of left hip, not elsewhere classified: Secondary | ICD-10-CM | POA: Diagnosis not present

## 2022-06-04 DIAGNOSIS — M4807 Spinal stenosis, lumbosacral region: Secondary | ICD-10-CM | POA: Diagnosis not present

## 2022-06-07 ENCOUNTER — Other Ambulatory Visit: Payer: Self-pay | Admitting: Family Medicine

## 2022-06-07 DIAGNOSIS — E039 Hypothyroidism, unspecified: Secondary | ICD-10-CM

## 2022-06-07 DIAGNOSIS — E782 Mixed hyperlipidemia: Secondary | ICD-10-CM

## 2022-06-07 DIAGNOSIS — I10 Essential (primary) hypertension: Secondary | ICD-10-CM

## 2022-06-08 DIAGNOSIS — R2689 Other abnormalities of gait and mobility: Secondary | ICD-10-CM | POA: Diagnosis not present

## 2022-06-08 DIAGNOSIS — M25651 Stiffness of right hip, not elsewhere classified: Secondary | ICD-10-CM | POA: Diagnosis not present

## 2022-06-08 DIAGNOSIS — M5187 Other intervertebral disc disorders, lumbosacral region: Secondary | ICD-10-CM | POA: Diagnosis not present

## 2022-06-08 DIAGNOSIS — M4807 Spinal stenosis, lumbosacral region: Secondary | ICD-10-CM | POA: Diagnosis not present

## 2022-06-08 DIAGNOSIS — M25652 Stiffness of left hip, not elsewhere classified: Secondary | ICD-10-CM | POA: Diagnosis not present

## 2022-06-11 DIAGNOSIS — M25652 Stiffness of left hip, not elsewhere classified: Secondary | ICD-10-CM | POA: Diagnosis not present

## 2022-06-11 DIAGNOSIS — R2689 Other abnormalities of gait and mobility: Secondary | ICD-10-CM | POA: Diagnosis not present

## 2022-06-11 DIAGNOSIS — M5187 Other intervertebral disc disorders, lumbosacral region: Secondary | ICD-10-CM | POA: Diagnosis not present

## 2022-06-11 DIAGNOSIS — M4807 Spinal stenosis, lumbosacral region: Secondary | ICD-10-CM | POA: Diagnosis not present

## 2022-06-11 DIAGNOSIS — M25651 Stiffness of right hip, not elsewhere classified: Secondary | ICD-10-CM | POA: Diagnosis not present

## 2022-06-13 DIAGNOSIS — M5187 Other intervertebral disc disorders, lumbosacral region: Secondary | ICD-10-CM | POA: Diagnosis not present

## 2022-06-13 DIAGNOSIS — M25651 Stiffness of right hip, not elsewhere classified: Secondary | ICD-10-CM | POA: Diagnosis not present

## 2022-06-13 DIAGNOSIS — M4807 Spinal stenosis, lumbosacral region: Secondary | ICD-10-CM | POA: Diagnosis not present

## 2022-06-13 DIAGNOSIS — R2689 Other abnormalities of gait and mobility: Secondary | ICD-10-CM | POA: Diagnosis not present

## 2022-06-13 DIAGNOSIS — M25652 Stiffness of left hip, not elsewhere classified: Secondary | ICD-10-CM | POA: Diagnosis not present

## 2022-06-18 DIAGNOSIS — R2689 Other abnormalities of gait and mobility: Secondary | ICD-10-CM | POA: Diagnosis not present

## 2022-06-18 DIAGNOSIS — M25652 Stiffness of left hip, not elsewhere classified: Secondary | ICD-10-CM | POA: Diagnosis not present

## 2022-06-18 DIAGNOSIS — M5187 Other intervertebral disc disorders, lumbosacral region: Secondary | ICD-10-CM | POA: Diagnosis not present

## 2022-06-18 DIAGNOSIS — M25651 Stiffness of right hip, not elsewhere classified: Secondary | ICD-10-CM | POA: Diagnosis not present

## 2022-06-18 DIAGNOSIS — M4807 Spinal stenosis, lumbosacral region: Secondary | ICD-10-CM | POA: Diagnosis not present

## 2022-06-21 DIAGNOSIS — M25651 Stiffness of right hip, not elsewhere classified: Secondary | ICD-10-CM | POA: Diagnosis not present

## 2022-06-21 DIAGNOSIS — M5187 Other intervertebral disc disorders, lumbosacral region: Secondary | ICD-10-CM | POA: Diagnosis not present

## 2022-06-21 DIAGNOSIS — M25652 Stiffness of left hip, not elsewhere classified: Secondary | ICD-10-CM | POA: Diagnosis not present

## 2022-06-21 DIAGNOSIS — M4807 Spinal stenosis, lumbosacral region: Secondary | ICD-10-CM | POA: Diagnosis not present

## 2022-06-21 DIAGNOSIS — R2689 Other abnormalities of gait and mobility: Secondary | ICD-10-CM | POA: Diagnosis not present

## 2022-06-25 DIAGNOSIS — M5187 Other intervertebral disc disorders, lumbosacral region: Secondary | ICD-10-CM | POA: Diagnosis not present

## 2022-06-25 DIAGNOSIS — M25651 Stiffness of right hip, not elsewhere classified: Secondary | ICD-10-CM | POA: Diagnosis not present

## 2022-06-25 DIAGNOSIS — M4807 Spinal stenosis, lumbosacral region: Secondary | ICD-10-CM | POA: Diagnosis not present

## 2022-06-25 DIAGNOSIS — M25652 Stiffness of left hip, not elsewhere classified: Secondary | ICD-10-CM | POA: Diagnosis not present

## 2022-06-25 DIAGNOSIS — R2689 Other abnormalities of gait and mobility: Secondary | ICD-10-CM | POA: Diagnosis not present

## 2022-06-26 NOTE — Progress Notes (Unsigned)
No chief complaint on file.   Courtney Castro is a 89 y.o. female who presents for annual physical exam, Medicare wellness visit and follow-up on chronic medical conditions.    H/o skin cancers (BCC's)--she continues under the care of dermatologist (Dr. Fontaine No).   Hypertension.  Compliant with losartan-HCT 100-25 and atenolol 25mg , denies side effects. She continues to get up 2-3 times/night to void, unchanged for years.  Denies incontinence, and is able to get back to sleep. She denies HA, dizziness, chest pain, muscle cramps, edema or shortness of breath. BP's aren't checked elsewhere. BP Readings from Last 3 Encounters:  12/11/21 134/72  06/08/21 132/80  04/24/21 125/76     Hyperlipidemia follow-up:  Patient is reportedly following a low-fat, low cholesterol diet.  Compliant with medications (simvastatin 20mg ) and denies medication side effects Lipids were at goal on last check, due for recheck.  She also had aortic atherosclerosis noted on CT 01/2021.  Lab Results  Component Value Date   CHOL 175 06/08/2021   HDL 74 06/08/2021   LDLCALC 85 06/08/2021   TRIG 91 06/08/2021   CHOLHDL 2.4 06/08/2021     Hypothyroidism: She reports compliance with taking medication on empty stomach. She denies changes to hair/skin/nail, energy/weight/moods. She has long h/o IBS, no significant bowel changes. Align helps her bowels. No thinning or changes to hair on head, but notes the lack of hair on legs/arms (no longer needs to shave her legs).  Lab Results  Component Value Date   TSH 1.550 06/08/2021      H/o impaired fasting glucose: She tries to limit her sweets and has small portions. She has very small portions of ice cream, not daily. Sugars have been normal the last few years. Lab Results  Component Value Date   HGBA1C 5.7 (H) 01/20/2021     Allergies: year-round, controlled with flonase and allegra daily.  Currently doing well, mucus is clear, comes and goes.   Exudative  macular degeneration--sees Dr. Cordelia Pen regularly. Previously had injections into L eye, none in over 2 years. She hasn't noticed significant change in vision (slight change in prescription of her glasses). Not driving much at night.   She was getting PT through [facility] ([facility]) through January 2024, to help with low back/hip pain and balance. Tylenol is effective for her pain, when needed.     Grief--she continues to see Ezekiel Slocumb for counseling every 2 weeks. She still occasionally goes to a [facility] program at [facility]. She plays canasta monthly with her old neighborhood friends.  New neighborhood??? UPDATE ALL  History of R breast cancer, s/p lumpectomy and radiation.  She last had mammo in 04/2022. Biopsy in 2018 showed fat necrosis with dense fibrosis (benign). 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 Normally colonoscopy would be recommended every 5 years, but due to age, this isn't being done.  Per visit with Dr. Watt Climes in May, 2021, recommendation is to check stool for blood.   Immunization History  Administered Date(s) Administered   COVID-19, mRNA, vaccine(Comirnaty)12 years and older 02/05/2022   Fluad Quad(high Dose 65+) 11/20/2018, 12/03/2019, 11/30/2020, 12/11/2021   Influenza Split 12/11/2009, 01/02/2011, 12/21/2011   Influenza, High Dose Seasonal PF 12/05/2012, 12/17/2013, 12/13/2014, 12/22/2015, 01/01/2017, 12/04/2017   PFIZER Comirnaty(Gray Top)Covid-19 Tri-Sucrose Vaccine 10/13/2020   PFIZER(Purple Top)SARS-COV-2 Vaccination 04/17/2019, 05/07/2019, 01/05/2020   Pfizer Covid-19 Vaccine Bivalent Booster 35yrs & up 12/22/2020   Pneumococcal Conjugate-13 03/04/2014   Pneumococcal Polysaccharide-23 11/07/2001, 01/31/2011, 05/30/2020   Td 12/18/2004  Tdap 01/31/2011, 03/22/2017   Zoster Recombinat (Shingrix) 07/10/2016, 10/18/2016   Zoster, Live 08/25/2007   Last Pap smear: 2011, s/p hysterectomy for benign reasons   Last mammogram: 04/2022 Last  colonoscopy:  June 2014 with Dr. Watt Climes. Negative FOB 08/2019 (Dr. Watt Climes), negative FIT testing 06/2020 here Last DEXA: 05/2016 normal Dentist: twice yearly   Ophtho: yearly; sees Dr. Cordelia Pen 2x/year UPDATE Exercise:   She goes to [facility] 2x/week.  Working on balance.  She uses bands.  Not getting much cardio there. Plans to start walking in her neighborhood UPDATE   Patient Care Team: Rita Ohara, MD as PCP - General (Family Medicine) Marygrace Drought, MD as Consulting Physician (Ophthalmology) Gerarda Fraction, MD as Referring Physician (Ophthalmology) Clarene Essex, MD as Consulting Physician (Gastroenterology) Sydnee Levans, MD as Referring Physician (Dermatology) Gaynelle Arabian, MD as Consulting Physician (Orthopedic Surgery) Melrose Nakayama, MD as Consulting Physician (Orthopedic Surgery) Erline Levine, MD as Consulting Physician (Neurosurgery) Kary Kos, MD as Consulting Physician (Neurosurgery) Dr. Genelle Bal as Consulting Physician (Dentistry) Podiatry: Dr. Fritzi Mandes (no longer sees)   Depression Screening: [place] Office Visit from 06/08/2021 in [place]  PHQ-2 Total Score 0       Falls screen:     12/11/2021    9:45 AM 06/08/2021    9:34 AM 01/16/2021   11:30 AM 11/30/2020   11:24 AM 05/30/2020    9:34 AM  Rafael Capo in the past year? 1 1 1  0 0  Number falls in past yr: 0 1 0 0   Comment 10/22 at spa in [place]  recent fall    Injury with Fall? 1 1 0 0   Comment bruises, did not break anything fell at spa in Oct. [place] again after that at home. (went to sit down on toilet at home). just bruising with both     Risk for fall due to : History of fall(s) History of fall(s) History of fall(s) No Fall Risks   Follow up Falls evaluation completed Falls evaluation completed Falls evaluation completed Falls evaluation completed      Functional Status Survey:   No major memory issues, just little things, mainly names (street names).        End of  Life Discussion:  Has living will and healthcare power of attorney forms. These are from 2007, and scanned into chart in 12/2019.  Husband was listed, but daughters also listed as [place] POA.    PMH, PSH, SH and FH were reviewed and updated    ROS: The patient denies anorexia, fever, weight changes, headaches, vision changes (stable), decreased hearing, ear pain, sore throat, breast concerns, chest pain, palpitations, dizziness, syncope, dyspnea on exertion, swelling, nausea, vomiting, diarrhea, constipation, abdominal pain, melena, hematochezia, indigestion/heartburn, hematuria, vaginal bleeding, genital lesions, joint pains, numbness, tingling, weakness, tremor, suspicious skin lesions, depression, anxiety, abnormal bleeding/bruising, or enlarged lymph nodes.   She denies any urinary complaints, just rare urge incontinence. Up 1-2 times/night to void  Allergies are controlled currently. Denies joint pains (glucosamine helps). Bowels are improved since taking Align.    PHYSICAL EXAM:  There were no vitals taken for this visit.  Wt Readings from Last 3 Encounters:  12/11/21 140 lb 6.4 oz (63.7 kg)  06/08/21 131 lb 12.8 oz (59.8 kg)  04/24/21 122 lb (55.3 kg)   General Appearance:    Alert, cooperative, no distress, appears stated age     Head:    Normocephalic, atraumatic   Eyes:  PERRL, conjunctiva/corneas clear, EOM's intact, fundi ok--no hemorrhages, exudates or cataracts    Ears:    Normal TM's and external ear canals     Nose:    No drainage or sinus tenderness  Throat:    Normal mucosa  Neck:    Supple, no lymphadenopathy; thyroid: no enlargement/ tenderness/nodules; no carotid bruit or JVD     Back:    Spine nontender, no curvature, ROM normal, no CVA tenderness.   Lungs:    Clear to auscultation bilaterally without wheezes, rales or ronchi; respirations unlabored     Chest Wall:    No tenderness or deformity     Heart:    Regular rate and rhythm, S1 and S2 normal, no murmur,  rub or gallop     Breast Exam:    No tenderness, masses, or nipple discharge or inversion. No axillary lymphadenopathy. WHSS R breast at 9 o'clock and in the right axilla. Nontender.  Some thickening at scars, unchanged. Left breast larger than right. Many SK's and some skin tags.  Abdomen:    Soft, non-tender, nondistended, normoactive bowel sounds,   no masses, no hepatosplenomegaly     Genitalia:    Patient declined exam today  Rectal:    Patient declined exam today    Extremities:    No clubbing, cyanosis or edema. Arthritis changes--deviation of distal 2nd, 3rd and 4th phalanges.  Nodules and arthritic changes at R index finger DIP. R great toe overlaps 2nd toe. Some callous present on feet.  Pulses:    2+ and symmetric all extremities     Skin:    Skin color, texture, turgor normal, no rashes or lesions. Many seborrheic keratosis throughout, especially on chest/abdomen.   Lymph nodes:    Cervical, supraclavicular, inguinal and axillary nodes normal     Neurologic:    Normal strength, sensation and gait; reflexes 2+ and symmetric throughout                          Psych:   Normal mood, affect, hygiene and grooming.   ***UPDATE if any pelvic/rectal done  ASSESSMENT/PLAN:  Can offer pelvic exam (didn't do last year)--fine if she prefers not to have this done. If not doing pelvic/rectal, then needs to be given the FIT testing (fecal occult blood), as we have done in the past--order and give kit.  COVID booster if she hasn't gotten one recently Did she get RSV vaccine?  Got 90d of all meds mid-March, no refills needed--except Flonase?? See if RF needed  C-met, cbc, lipids, TSH  Discussed monthly self breast exams and yearly mammograms; at least 30 minutes of aerobic activity at least 5 days/week, weight-bearing exercise 2/week; proper sunscreen use reviewed; healthy diet, including goals of calcium and vitamin D intake and alcohol recommendations (less than or equal to 1 drink/day)  reviewed; regular seatbelt use; changing batteries in smoke detectors. Immunization recommendations discussed--continue yearly high dose flu shots.  COVID booster RSV Colon cancer screening: FIT test given to patient today. UPDATE    She is Full Code, Full Care. MOST form reviewed/signed  F/u 6 months for med check, sooner prn.    Medicare Attestation I have personally reviewed: The patient's medical and social history Their use of alcohol, tobacco or illicit drugs Their current medications and supplements The patient's functional ability including ADLs,fall risks, home safety risks, cognitive, and hearing and visual impairment Diet and physical activities Evidence for depression or mood disorders  The  patient's weight, height, BMI have been recorded in the chart.  I have made referrals, counseling, and provided education to the patient based on review of the above and I have provided the patient with a written personalized care plan for preventive services.

## 2022-06-26 NOTE — Patient Instructions (Incomplete)
  HEALTH MAINTENANCE RECOMMENDATIONS:  It is recommended that you get at least 30 minutes of aerobic exercise at least 5 days/week (for weight loss, you may need as much as 60-90 minutes). This can be any activity that gets your heart rate up. This can be divided in 10-15 minute intervals if needed, but try and build up your endurance at least once a week.  Weight bearing exercise is also recommended twice weekly.  Eat a healthy diet with lots of vegetables, fruits and fiber.  "Colorful" foods have a lot of vitamins (ie green vegetables, tomatoes, red peppers, etc).  Limit sweet tea, regular sodas and alcoholic beverages, all of which has a lot of calories and sugar.  Up to 1 alcoholic drink daily may be beneficial for women (unless trying to lose weight, watch sugars).  Drink a lot of water.  Calcium recommendations are 1200-1500 mg daily (1500 mg for postmenopausal women or women without ovaries), and vitamin D 1000 IU daily.  This should be obtained from diet and/or supplements (vitamins), and calcium should not be taken all at once, but in divided doses.  Monthly self breast exams and yearly mammograms for women over the age of 41 is recommended.  Sunscreen of at least SPF 30 should be used on all sun-exposed parts of the skin when outside between the hours of 10 am and 4 pm (not just when at beach or pool, but even with exercise, golf, tennis, and yard work!)  Use a sunscreen that says "broad spectrum" so it covers both UVA and UVB rays, and make sure to reapply every 1-2 hours.  Remember to change the batteries in your smoke detectors when changing your clock times in the spring and fall. Carbon monoxide detectors are recommended for your home.  Use your seat belt every time you are in a car, and please drive safely and not be distracted with cell phones and texting while driving.   Ms. Schupp , Thank you for taking time to come for your Medicare Wellness Visit. I appreciate your ongoing  commitment to your health goals. Please review the following plan we discussed and let me know if I can assist you in the future.   This is a list of the screening recommended for you and due dates:  Health Maintenance  Topic Date Due   Flu Shot  10/25/2022   Medicare Annual Wellness Visit  06/27/2023   DTaP/Tdap/Td vaccine (4 - Td or Tdap) 03/23/2027   Pneumonia Vaccine  Completed   DEXA scan (bone density measurement)  Completed   COVID-19 Vaccine  Completed   Zoster (Shingles) Vaccine  Completed   HPV Vaccine  Aged Out   I recommend getting the RSV vaccine the Fall (when you get your flu shot).

## 2022-06-27 ENCOUNTER — Encounter: Payer: Self-pay | Admitting: Family Medicine

## 2022-06-27 ENCOUNTER — Ambulatory Visit (INDEPENDENT_AMBULATORY_CARE_PROVIDER_SITE_OTHER): Payer: Medicare Other | Admitting: Family Medicine

## 2022-06-27 VITALS — BP 128/76 | HR 64 | Ht 64.0 in | Wt 137.0 lb

## 2022-06-27 DIAGNOSIS — H353221 Exudative age-related macular degeneration, left eye, with active choroidal neovascularization: Secondary | ICD-10-CM

## 2022-06-27 DIAGNOSIS — Z853 Personal history of malignant neoplasm of breast: Secondary | ICD-10-CM | POA: Diagnosis not present

## 2022-06-27 DIAGNOSIS — Z23 Encounter for immunization: Secondary | ICD-10-CM

## 2022-06-27 DIAGNOSIS — E782 Mixed hyperlipidemia: Secondary | ICD-10-CM | POA: Diagnosis not present

## 2022-06-27 DIAGNOSIS — I700 Atherosclerosis of aorta: Secondary | ICD-10-CM

## 2022-06-27 DIAGNOSIS — Z5181 Encounter for therapeutic drug level monitoring: Secondary | ICD-10-CM

## 2022-06-27 DIAGNOSIS — Z0000 Encounter for general adult medical examination without abnormal findings: Secondary | ICD-10-CM | POA: Diagnosis not present

## 2022-06-27 DIAGNOSIS — E039 Hypothyroidism, unspecified: Secondary | ICD-10-CM | POA: Diagnosis not present

## 2022-06-27 DIAGNOSIS — I10 Essential (primary) hypertension: Secondary | ICD-10-CM | POA: Diagnosis not present

## 2022-06-27 DIAGNOSIS — J309 Allergic rhinitis, unspecified: Secondary | ICD-10-CM | POA: Diagnosis not present

## 2022-06-27 DIAGNOSIS — R350 Frequency of micturition: Secondary | ICD-10-CM | POA: Diagnosis not present

## 2022-06-27 DIAGNOSIS — D692 Other nonthrombocytopenic purpura: Secondary | ICD-10-CM | POA: Diagnosis not present

## 2022-06-27 LAB — LIPID PANEL

## 2022-06-27 LAB — POCT URINALYSIS DIP (PROADVANTAGE DEVICE)
Bilirubin, UA: NEGATIVE
Blood, UA: NEGATIVE
Glucose, UA: NEGATIVE mg/dL
Ketones, POC UA: NEGATIVE mg/dL
Nitrite, UA: NEGATIVE
Protein Ur, POC: NEGATIVE mg/dL
Specific Gravity, Urine: 1.03
Urobilinogen, Ur: 0.2
pH, UA: 7 (ref 5.0–8.0)

## 2022-06-28 DIAGNOSIS — M25651 Stiffness of right hip, not elsewhere classified: Secondary | ICD-10-CM | POA: Diagnosis not present

## 2022-06-28 DIAGNOSIS — R2689 Other abnormalities of gait and mobility: Secondary | ICD-10-CM | POA: Diagnosis not present

## 2022-06-28 DIAGNOSIS — M5187 Other intervertebral disc disorders, lumbosacral region: Secondary | ICD-10-CM | POA: Diagnosis not present

## 2022-06-28 DIAGNOSIS — M25652 Stiffness of left hip, not elsewhere classified: Secondary | ICD-10-CM | POA: Diagnosis not present

## 2022-06-28 DIAGNOSIS — M4807 Spinal stenosis, lumbosacral region: Secondary | ICD-10-CM | POA: Diagnosis not present

## 2022-06-28 LAB — CBC WITH DIFFERENTIAL/PLATELET
Basophils Absolute: 0 10*3/uL (ref 0.0–0.2)
Basos: 0 %
EOS (ABSOLUTE): 0 10*3/uL (ref 0.0–0.4)
Eos: 0 %
Hematocrit: 46.3 % (ref 34.0–46.6)
Hemoglobin: 15.4 g/dL (ref 11.1–15.9)
Immature Grans (Abs): 0 10*3/uL (ref 0.0–0.1)
Immature Granulocytes: 0 %
Lymphocytes Absolute: 1 10*3/uL (ref 0.7–3.1)
Lymphs: 11 %
MCH: 30 pg (ref 26.6–33.0)
MCHC: 33.3 g/dL (ref 31.5–35.7)
MCV: 90 fL (ref 79–97)
Monocytes Absolute: 0.9 10*3/uL (ref 0.1–0.9)
Monocytes: 10 %
Neutrophils Absolute: 7.2 10*3/uL — ABNORMAL HIGH (ref 1.4–7.0)
Neutrophils: 79 %
Platelets: 251 10*3/uL (ref 150–450)
RBC: 5.13 x10E6/uL (ref 3.77–5.28)
RDW: 11.8 % (ref 11.7–15.4)
WBC: 9.2 10*3/uL (ref 3.4–10.8)

## 2022-06-28 LAB — LIPID PANEL
Chol/HDL Ratio: 2.2 ratio (ref 0.0–4.4)
Cholesterol, Total: 164 mg/dL (ref 100–199)
HDL: 73 mg/dL (ref >39)
LDL Chol Calc (NIH): 76 mg/dL (ref 0–99)
Triglycerides: 81 mg/dL (ref 0–149)
VLDL Cholesterol Cal: 15 mg/dL (ref 5–40)

## 2022-06-28 LAB — COMPREHENSIVE METABOLIC PANEL
ALT: 16 IU/L (ref 0–32)
AST: 24 IU/L (ref 0–40)
Albumin/Globulin Ratio: 3.1 — ABNORMAL HIGH (ref 1.2–2.2)
Albumin: 4.6 g/dL (ref 3.7–4.7)
Alkaline Phosphatase: 114 IU/L (ref 44–121)
BUN/Creatinine Ratio: 22 (ref 12–28)
BUN: 17 mg/dL (ref 8–27)
Bilirubin Total: 1.5 mg/dL — ABNORMAL HIGH (ref 0.0–1.2)
CO2: 25 mmol/L (ref 20–29)
Calcium: 9.9 mg/dL (ref 8.7–10.3)
Chloride: 92 mmol/L — ABNORMAL LOW (ref 96–106)
Creatinine, Ser: 0.77 mg/dL (ref 0.57–1.00)
Globulin, Total: 1.5 g/dL (ref 1.5–4.5)
Glucose: 99 mg/dL (ref 70–99)
Potassium: 3.5 mmol/L (ref 3.5–5.2)
Sodium: 132 mmol/L — ABNORMAL LOW (ref 134–144)
Total Protein: 6.1 g/dL (ref 6.0–8.5)
eGFR: 75 mL/min/{1.73_m2} (ref >59)

## 2022-06-28 LAB — TSH: TSH: 1.28 u[IU]/mL (ref 0.450–4.500)

## 2022-07-02 DIAGNOSIS — M25651 Stiffness of right hip, not elsewhere classified: Secondary | ICD-10-CM | POA: Diagnosis not present

## 2022-07-02 DIAGNOSIS — M4807 Spinal stenosis, lumbosacral region: Secondary | ICD-10-CM | POA: Diagnosis not present

## 2022-07-02 DIAGNOSIS — M25652 Stiffness of left hip, not elsewhere classified: Secondary | ICD-10-CM | POA: Diagnosis not present

## 2022-07-02 DIAGNOSIS — R2689 Other abnormalities of gait and mobility: Secondary | ICD-10-CM | POA: Diagnosis not present

## 2022-07-02 DIAGNOSIS — M5187 Other intervertebral disc disorders, lumbosacral region: Secondary | ICD-10-CM | POA: Diagnosis not present

## 2022-07-05 DIAGNOSIS — M4807 Spinal stenosis, lumbosacral region: Secondary | ICD-10-CM | POA: Diagnosis not present

## 2022-07-05 DIAGNOSIS — R2689 Other abnormalities of gait and mobility: Secondary | ICD-10-CM | POA: Diagnosis not present

## 2022-07-05 DIAGNOSIS — M25651 Stiffness of right hip, not elsewhere classified: Secondary | ICD-10-CM | POA: Diagnosis not present

## 2022-07-05 DIAGNOSIS — M5187 Other intervertebral disc disorders, lumbosacral region: Secondary | ICD-10-CM | POA: Diagnosis not present

## 2022-07-05 DIAGNOSIS — M25652 Stiffness of left hip, not elsewhere classified: Secondary | ICD-10-CM | POA: Diagnosis not present

## 2022-07-09 DIAGNOSIS — M4807 Spinal stenosis, lumbosacral region: Secondary | ICD-10-CM | POA: Diagnosis not present

## 2022-07-09 DIAGNOSIS — M5187 Other intervertebral disc disorders, lumbosacral region: Secondary | ICD-10-CM | POA: Diagnosis not present

## 2022-07-09 DIAGNOSIS — M25652 Stiffness of left hip, not elsewhere classified: Secondary | ICD-10-CM | POA: Diagnosis not present

## 2022-07-09 DIAGNOSIS — M25651 Stiffness of right hip, not elsewhere classified: Secondary | ICD-10-CM | POA: Diagnosis not present

## 2022-07-09 DIAGNOSIS — R2689 Other abnormalities of gait and mobility: Secondary | ICD-10-CM | POA: Diagnosis not present

## 2022-07-11 DIAGNOSIS — M5187 Other intervertebral disc disorders, lumbosacral region: Secondary | ICD-10-CM | POA: Diagnosis not present

## 2022-07-11 DIAGNOSIS — M25652 Stiffness of left hip, not elsewhere classified: Secondary | ICD-10-CM | POA: Diagnosis not present

## 2022-07-11 DIAGNOSIS — M25651 Stiffness of right hip, not elsewhere classified: Secondary | ICD-10-CM | POA: Diagnosis not present

## 2022-07-11 DIAGNOSIS — R2689 Other abnormalities of gait and mobility: Secondary | ICD-10-CM | POA: Diagnosis not present

## 2022-07-11 DIAGNOSIS — M4807 Spinal stenosis, lumbosacral region: Secondary | ICD-10-CM | POA: Diagnosis not present

## 2022-07-16 DIAGNOSIS — M25652 Stiffness of left hip, not elsewhere classified: Secondary | ICD-10-CM | POA: Diagnosis not present

## 2022-07-16 DIAGNOSIS — M25651 Stiffness of right hip, not elsewhere classified: Secondary | ICD-10-CM | POA: Diagnosis not present

## 2022-07-16 DIAGNOSIS — R2689 Other abnormalities of gait and mobility: Secondary | ICD-10-CM | POA: Diagnosis not present

## 2022-07-16 DIAGNOSIS — M5187 Other intervertebral disc disorders, lumbosacral region: Secondary | ICD-10-CM | POA: Diagnosis not present

## 2022-07-16 DIAGNOSIS — M4807 Spinal stenosis, lumbosacral region: Secondary | ICD-10-CM | POA: Diagnosis not present

## 2022-07-19 DIAGNOSIS — R2689 Other abnormalities of gait and mobility: Secondary | ICD-10-CM | POA: Diagnosis not present

## 2022-07-19 DIAGNOSIS — M5187 Other intervertebral disc disorders, lumbosacral region: Secondary | ICD-10-CM | POA: Diagnosis not present

## 2022-07-19 DIAGNOSIS — M25652 Stiffness of left hip, not elsewhere classified: Secondary | ICD-10-CM | POA: Diagnosis not present

## 2022-07-19 DIAGNOSIS — M25651 Stiffness of right hip, not elsewhere classified: Secondary | ICD-10-CM | POA: Diagnosis not present

## 2022-07-19 DIAGNOSIS — M4807 Spinal stenosis, lumbosacral region: Secondary | ICD-10-CM | POA: Diagnosis not present

## 2022-07-23 DIAGNOSIS — M4807 Spinal stenosis, lumbosacral region: Secondary | ICD-10-CM | POA: Diagnosis not present

## 2022-07-23 DIAGNOSIS — M25651 Stiffness of right hip, not elsewhere classified: Secondary | ICD-10-CM | POA: Diagnosis not present

## 2022-07-23 DIAGNOSIS — M25652 Stiffness of left hip, not elsewhere classified: Secondary | ICD-10-CM | POA: Diagnosis not present

## 2022-07-23 DIAGNOSIS — M5187 Other intervertebral disc disorders, lumbosacral region: Secondary | ICD-10-CM | POA: Diagnosis not present

## 2022-07-23 DIAGNOSIS — R2689 Other abnormalities of gait and mobility: Secondary | ICD-10-CM | POA: Diagnosis not present

## 2022-07-26 DIAGNOSIS — M25651 Stiffness of right hip, not elsewhere classified: Secondary | ICD-10-CM | POA: Diagnosis not present

## 2022-07-26 DIAGNOSIS — M5187 Other intervertebral disc disorders, lumbosacral region: Secondary | ICD-10-CM | POA: Diagnosis not present

## 2022-07-26 DIAGNOSIS — M4807 Spinal stenosis, lumbosacral region: Secondary | ICD-10-CM | POA: Diagnosis not present

## 2022-07-26 DIAGNOSIS — R2689 Other abnormalities of gait and mobility: Secondary | ICD-10-CM | POA: Diagnosis not present

## 2022-07-26 DIAGNOSIS — M25652 Stiffness of left hip, not elsewhere classified: Secondary | ICD-10-CM | POA: Diagnosis not present

## 2022-08-01 DIAGNOSIS — M4807 Spinal stenosis, lumbosacral region: Secondary | ICD-10-CM | POA: Diagnosis not present

## 2022-08-01 DIAGNOSIS — M25651 Stiffness of right hip, not elsewhere classified: Secondary | ICD-10-CM | POA: Diagnosis not present

## 2022-08-01 DIAGNOSIS — R2689 Other abnormalities of gait and mobility: Secondary | ICD-10-CM | POA: Diagnosis not present

## 2022-08-01 DIAGNOSIS — M5187 Other intervertebral disc disorders, lumbosacral region: Secondary | ICD-10-CM | POA: Diagnosis not present

## 2022-08-01 DIAGNOSIS — M25652 Stiffness of left hip, not elsewhere classified: Secondary | ICD-10-CM | POA: Diagnosis not present

## 2022-08-02 ENCOUNTER — Other Ambulatory Visit: Payer: Self-pay | Admitting: Family Medicine

## 2022-08-03 DIAGNOSIS — R2689 Other abnormalities of gait and mobility: Secondary | ICD-10-CM | POA: Diagnosis not present

## 2022-08-03 DIAGNOSIS — M4807 Spinal stenosis, lumbosacral region: Secondary | ICD-10-CM | POA: Diagnosis not present

## 2022-08-03 DIAGNOSIS — M25651 Stiffness of right hip, not elsewhere classified: Secondary | ICD-10-CM | POA: Diagnosis not present

## 2022-08-03 DIAGNOSIS — M5187 Other intervertebral disc disorders, lumbosacral region: Secondary | ICD-10-CM | POA: Diagnosis not present

## 2022-08-03 DIAGNOSIS — M25652 Stiffness of left hip, not elsewhere classified: Secondary | ICD-10-CM | POA: Diagnosis not present

## 2022-08-06 DIAGNOSIS — M4807 Spinal stenosis, lumbosacral region: Secondary | ICD-10-CM | POA: Diagnosis not present

## 2022-08-06 DIAGNOSIS — M25651 Stiffness of right hip, not elsewhere classified: Secondary | ICD-10-CM | POA: Diagnosis not present

## 2022-08-06 DIAGNOSIS — R2689 Other abnormalities of gait and mobility: Secondary | ICD-10-CM | POA: Diagnosis not present

## 2022-08-06 DIAGNOSIS — M5187 Other intervertebral disc disorders, lumbosacral region: Secondary | ICD-10-CM | POA: Diagnosis not present

## 2022-08-06 DIAGNOSIS — M25652 Stiffness of left hip, not elsewhere classified: Secondary | ICD-10-CM | POA: Diagnosis not present

## 2022-08-07 DIAGNOSIS — H04122 Dry eye syndrome of left lacrimal gland: Secondary | ICD-10-CM | POA: Diagnosis not present

## 2022-08-07 DIAGNOSIS — H524 Presbyopia: Secondary | ICD-10-CM | POA: Diagnosis not present

## 2022-08-07 DIAGNOSIS — H5213 Myopia, bilateral: Secondary | ICD-10-CM | POA: Diagnosis not present

## 2022-08-07 DIAGNOSIS — H26493 Other secondary cataract, bilateral: Secondary | ICD-10-CM | POA: Diagnosis not present

## 2022-08-08 ENCOUNTER — Other Ambulatory Visit: Payer: Self-pay | Admitting: Family Medicine

## 2022-08-08 DIAGNOSIS — E039 Hypothyroidism, unspecified: Secondary | ICD-10-CM

## 2022-08-10 DIAGNOSIS — M25651 Stiffness of right hip, not elsewhere classified: Secondary | ICD-10-CM | POA: Diagnosis not present

## 2022-08-10 DIAGNOSIS — R2689 Other abnormalities of gait and mobility: Secondary | ICD-10-CM | POA: Diagnosis not present

## 2022-08-10 DIAGNOSIS — M5187 Other intervertebral disc disorders, lumbosacral region: Secondary | ICD-10-CM | POA: Diagnosis not present

## 2022-08-10 DIAGNOSIS — M25652 Stiffness of left hip, not elsewhere classified: Secondary | ICD-10-CM | POA: Diagnosis not present

## 2022-08-10 DIAGNOSIS — M4807 Spinal stenosis, lumbosacral region: Secondary | ICD-10-CM | POA: Diagnosis not present

## 2022-08-13 DIAGNOSIS — M5187 Other intervertebral disc disorders, lumbosacral region: Secondary | ICD-10-CM | POA: Diagnosis not present

## 2022-08-13 DIAGNOSIS — M4807 Spinal stenosis, lumbosacral region: Secondary | ICD-10-CM | POA: Diagnosis not present

## 2022-08-13 DIAGNOSIS — M25651 Stiffness of right hip, not elsewhere classified: Secondary | ICD-10-CM | POA: Diagnosis not present

## 2022-08-13 DIAGNOSIS — R2689 Other abnormalities of gait and mobility: Secondary | ICD-10-CM | POA: Diagnosis not present

## 2022-08-13 DIAGNOSIS — M25652 Stiffness of left hip, not elsewhere classified: Secondary | ICD-10-CM | POA: Diagnosis not present

## 2022-08-16 DIAGNOSIS — M5187 Other intervertebral disc disorders, lumbosacral region: Secondary | ICD-10-CM | POA: Diagnosis not present

## 2022-08-16 DIAGNOSIS — M25652 Stiffness of left hip, not elsewhere classified: Secondary | ICD-10-CM | POA: Diagnosis not present

## 2022-08-16 DIAGNOSIS — M4807 Spinal stenosis, lumbosacral region: Secondary | ICD-10-CM | POA: Diagnosis not present

## 2022-08-16 DIAGNOSIS — M25651 Stiffness of right hip, not elsewhere classified: Secondary | ICD-10-CM | POA: Diagnosis not present

## 2022-08-16 DIAGNOSIS — R2689 Other abnormalities of gait and mobility: Secondary | ICD-10-CM | POA: Diagnosis not present

## 2022-08-21 DIAGNOSIS — M5187 Other intervertebral disc disorders, lumbosacral region: Secondary | ICD-10-CM | POA: Diagnosis not present

## 2022-08-21 DIAGNOSIS — R2689 Other abnormalities of gait and mobility: Secondary | ICD-10-CM | POA: Diagnosis not present

## 2022-08-21 DIAGNOSIS — M25651 Stiffness of right hip, not elsewhere classified: Secondary | ICD-10-CM | POA: Diagnosis not present

## 2022-08-21 DIAGNOSIS — M4807 Spinal stenosis, lumbosacral region: Secondary | ICD-10-CM | POA: Diagnosis not present

## 2022-08-21 DIAGNOSIS — M25652 Stiffness of left hip, not elsewhere classified: Secondary | ICD-10-CM | POA: Diagnosis not present

## 2022-08-23 DIAGNOSIS — M5187 Other intervertebral disc disorders, lumbosacral region: Secondary | ICD-10-CM | POA: Diagnosis not present

## 2022-08-23 DIAGNOSIS — M25651 Stiffness of right hip, not elsewhere classified: Secondary | ICD-10-CM | POA: Diagnosis not present

## 2022-08-23 DIAGNOSIS — M4807 Spinal stenosis, lumbosacral region: Secondary | ICD-10-CM | POA: Diagnosis not present

## 2022-08-23 DIAGNOSIS — R2689 Other abnormalities of gait and mobility: Secondary | ICD-10-CM | POA: Diagnosis not present

## 2022-08-23 DIAGNOSIS — M25652 Stiffness of left hip, not elsewhere classified: Secondary | ICD-10-CM | POA: Diagnosis not present

## 2022-08-29 DIAGNOSIS — M25652 Stiffness of left hip, not elsewhere classified: Secondary | ICD-10-CM | POA: Diagnosis not present

## 2022-08-29 DIAGNOSIS — M5187 Other intervertebral disc disorders, lumbosacral region: Secondary | ICD-10-CM | POA: Diagnosis not present

## 2022-08-29 DIAGNOSIS — R2689 Other abnormalities of gait and mobility: Secondary | ICD-10-CM | POA: Diagnosis not present

## 2022-08-29 DIAGNOSIS — M25651 Stiffness of right hip, not elsewhere classified: Secondary | ICD-10-CM | POA: Diagnosis not present

## 2022-08-29 DIAGNOSIS — M4807 Spinal stenosis, lumbosacral region: Secondary | ICD-10-CM | POA: Diagnosis not present

## 2022-08-31 DIAGNOSIS — M4807 Spinal stenosis, lumbosacral region: Secondary | ICD-10-CM | POA: Diagnosis not present

## 2022-08-31 DIAGNOSIS — M5187 Other intervertebral disc disorders, lumbosacral region: Secondary | ICD-10-CM | POA: Diagnosis not present

## 2022-08-31 DIAGNOSIS — R2689 Other abnormalities of gait and mobility: Secondary | ICD-10-CM | POA: Diagnosis not present

## 2022-08-31 DIAGNOSIS — M25651 Stiffness of right hip, not elsewhere classified: Secondary | ICD-10-CM | POA: Diagnosis not present

## 2022-08-31 DIAGNOSIS — M25652 Stiffness of left hip, not elsewhere classified: Secondary | ICD-10-CM | POA: Diagnosis not present

## 2022-09-03 ENCOUNTER — Other Ambulatory Visit: Payer: Self-pay | Admitting: Family Medicine

## 2022-09-03 DIAGNOSIS — M25651 Stiffness of right hip, not elsewhere classified: Secondary | ICD-10-CM | POA: Diagnosis not present

## 2022-09-03 DIAGNOSIS — R2689 Other abnormalities of gait and mobility: Secondary | ICD-10-CM | POA: Diagnosis not present

## 2022-09-03 DIAGNOSIS — M5187 Other intervertebral disc disorders, lumbosacral region: Secondary | ICD-10-CM | POA: Diagnosis not present

## 2022-09-03 DIAGNOSIS — E782 Mixed hyperlipidemia: Secondary | ICD-10-CM

## 2022-09-03 DIAGNOSIS — M4807 Spinal stenosis, lumbosacral region: Secondary | ICD-10-CM | POA: Diagnosis not present

## 2022-09-03 DIAGNOSIS — M25652 Stiffness of left hip, not elsewhere classified: Secondary | ICD-10-CM | POA: Diagnosis not present

## 2022-09-10 DIAGNOSIS — M25652 Stiffness of left hip, not elsewhere classified: Secondary | ICD-10-CM | POA: Diagnosis not present

## 2022-09-10 DIAGNOSIS — M25651 Stiffness of right hip, not elsewhere classified: Secondary | ICD-10-CM | POA: Diagnosis not present

## 2022-09-10 DIAGNOSIS — M5187 Other intervertebral disc disorders, lumbosacral region: Secondary | ICD-10-CM | POA: Diagnosis not present

## 2022-09-10 DIAGNOSIS — R2689 Other abnormalities of gait and mobility: Secondary | ICD-10-CM | POA: Diagnosis not present

## 2022-09-10 DIAGNOSIS — M4807 Spinal stenosis, lumbosacral region: Secondary | ICD-10-CM | POA: Diagnosis not present

## 2022-09-13 DIAGNOSIS — M5187 Other intervertebral disc disorders, lumbosacral region: Secondary | ICD-10-CM | POA: Diagnosis not present

## 2022-09-13 DIAGNOSIS — M25652 Stiffness of left hip, not elsewhere classified: Secondary | ICD-10-CM | POA: Diagnosis not present

## 2022-09-13 DIAGNOSIS — R2689 Other abnormalities of gait and mobility: Secondary | ICD-10-CM | POA: Diagnosis not present

## 2022-09-13 DIAGNOSIS — M4807 Spinal stenosis, lumbosacral region: Secondary | ICD-10-CM | POA: Diagnosis not present

## 2022-09-13 DIAGNOSIS — M25651 Stiffness of right hip, not elsewhere classified: Secondary | ICD-10-CM | POA: Diagnosis not present

## 2022-09-17 DIAGNOSIS — M4807 Spinal stenosis, lumbosacral region: Secondary | ICD-10-CM | POA: Diagnosis not present

## 2022-09-17 DIAGNOSIS — R2689 Other abnormalities of gait and mobility: Secondary | ICD-10-CM | POA: Diagnosis not present

## 2022-09-17 DIAGNOSIS — M25652 Stiffness of left hip, not elsewhere classified: Secondary | ICD-10-CM | POA: Diagnosis not present

## 2022-09-17 DIAGNOSIS — M5187 Other intervertebral disc disorders, lumbosacral region: Secondary | ICD-10-CM | POA: Diagnosis not present

## 2022-09-17 DIAGNOSIS — M25651 Stiffness of right hip, not elsewhere classified: Secondary | ICD-10-CM | POA: Diagnosis not present

## 2022-09-20 DIAGNOSIS — R2689 Other abnormalities of gait and mobility: Secondary | ICD-10-CM | POA: Diagnosis not present

## 2022-09-20 DIAGNOSIS — M4807 Spinal stenosis, lumbosacral region: Secondary | ICD-10-CM | POA: Diagnosis not present

## 2022-09-20 DIAGNOSIS — M25652 Stiffness of left hip, not elsewhere classified: Secondary | ICD-10-CM | POA: Diagnosis not present

## 2022-09-20 DIAGNOSIS — M5187 Other intervertebral disc disorders, lumbosacral region: Secondary | ICD-10-CM | POA: Diagnosis not present

## 2022-09-20 DIAGNOSIS — M25651 Stiffness of right hip, not elsewhere classified: Secondary | ICD-10-CM | POA: Diagnosis not present

## 2022-09-24 DIAGNOSIS — M5187 Other intervertebral disc disorders, lumbosacral region: Secondary | ICD-10-CM | POA: Diagnosis not present

## 2022-09-24 DIAGNOSIS — R2689 Other abnormalities of gait and mobility: Secondary | ICD-10-CM | POA: Diagnosis not present

## 2022-09-24 DIAGNOSIS — M4807 Spinal stenosis, lumbosacral region: Secondary | ICD-10-CM | POA: Diagnosis not present

## 2022-09-24 DIAGNOSIS — M25651 Stiffness of right hip, not elsewhere classified: Secondary | ICD-10-CM | POA: Diagnosis not present

## 2022-09-24 DIAGNOSIS — M25652 Stiffness of left hip, not elsewhere classified: Secondary | ICD-10-CM | POA: Diagnosis not present

## 2022-09-26 DIAGNOSIS — R2689 Other abnormalities of gait and mobility: Secondary | ICD-10-CM | POA: Diagnosis not present

## 2022-09-26 DIAGNOSIS — M5187 Other intervertebral disc disorders, lumbosacral region: Secondary | ICD-10-CM | POA: Diagnosis not present

## 2022-09-26 DIAGNOSIS — M25652 Stiffness of left hip, not elsewhere classified: Secondary | ICD-10-CM | POA: Diagnosis not present

## 2022-09-26 DIAGNOSIS — M4807 Spinal stenosis, lumbosacral region: Secondary | ICD-10-CM | POA: Diagnosis not present

## 2022-09-26 DIAGNOSIS — M25651 Stiffness of right hip, not elsewhere classified: Secondary | ICD-10-CM | POA: Diagnosis not present

## 2022-10-01 DIAGNOSIS — M5187 Other intervertebral disc disorders, lumbosacral region: Secondary | ICD-10-CM | POA: Diagnosis not present

## 2022-10-01 DIAGNOSIS — M25651 Stiffness of right hip, not elsewhere classified: Secondary | ICD-10-CM | POA: Diagnosis not present

## 2022-10-01 DIAGNOSIS — M4807 Spinal stenosis, lumbosacral region: Secondary | ICD-10-CM | POA: Diagnosis not present

## 2022-10-01 DIAGNOSIS — R2689 Other abnormalities of gait and mobility: Secondary | ICD-10-CM | POA: Diagnosis not present

## 2022-10-01 DIAGNOSIS — M25652 Stiffness of left hip, not elsewhere classified: Secondary | ICD-10-CM | POA: Diagnosis not present

## 2022-10-03 ENCOUNTER — Other Ambulatory Visit: Payer: Self-pay | Admitting: Family Medicine

## 2022-10-03 DIAGNOSIS — I10 Essential (primary) hypertension: Secondary | ICD-10-CM

## 2022-10-04 DIAGNOSIS — M25652 Stiffness of left hip, not elsewhere classified: Secondary | ICD-10-CM | POA: Diagnosis not present

## 2022-10-04 DIAGNOSIS — M5187 Other intervertebral disc disorders, lumbosacral region: Secondary | ICD-10-CM | POA: Diagnosis not present

## 2022-10-04 DIAGNOSIS — M4807 Spinal stenosis, lumbosacral region: Secondary | ICD-10-CM | POA: Diagnosis not present

## 2022-10-04 DIAGNOSIS — R2689 Other abnormalities of gait and mobility: Secondary | ICD-10-CM | POA: Diagnosis not present

## 2022-10-04 DIAGNOSIS — M25651 Stiffness of right hip, not elsewhere classified: Secondary | ICD-10-CM | POA: Diagnosis not present

## 2022-10-08 DIAGNOSIS — M25652 Stiffness of left hip, not elsewhere classified: Secondary | ICD-10-CM | POA: Diagnosis not present

## 2022-10-08 DIAGNOSIS — M25651 Stiffness of right hip, not elsewhere classified: Secondary | ICD-10-CM | POA: Diagnosis not present

## 2022-10-08 DIAGNOSIS — M4807 Spinal stenosis, lumbosacral region: Secondary | ICD-10-CM | POA: Diagnosis not present

## 2022-10-08 DIAGNOSIS — M5187 Other intervertebral disc disorders, lumbosacral region: Secondary | ICD-10-CM | POA: Diagnosis not present

## 2022-10-08 DIAGNOSIS — R2689 Other abnormalities of gait and mobility: Secondary | ICD-10-CM | POA: Diagnosis not present

## 2022-10-11 DIAGNOSIS — M25652 Stiffness of left hip, not elsewhere classified: Secondary | ICD-10-CM | POA: Diagnosis not present

## 2022-10-11 DIAGNOSIS — M5187 Other intervertebral disc disorders, lumbosacral region: Secondary | ICD-10-CM | POA: Diagnosis not present

## 2022-10-11 DIAGNOSIS — R2689 Other abnormalities of gait and mobility: Secondary | ICD-10-CM | POA: Diagnosis not present

## 2022-10-11 DIAGNOSIS — M4807 Spinal stenosis, lumbosacral region: Secondary | ICD-10-CM | POA: Diagnosis not present

## 2022-10-11 DIAGNOSIS — M25651 Stiffness of right hip, not elsewhere classified: Secondary | ICD-10-CM | POA: Diagnosis not present

## 2022-10-15 DIAGNOSIS — M25651 Stiffness of right hip, not elsewhere classified: Secondary | ICD-10-CM | POA: Diagnosis not present

## 2022-10-15 DIAGNOSIS — R2689 Other abnormalities of gait and mobility: Secondary | ICD-10-CM | POA: Diagnosis not present

## 2022-10-15 DIAGNOSIS — M25652 Stiffness of left hip, not elsewhere classified: Secondary | ICD-10-CM | POA: Diagnosis not present

## 2022-10-15 DIAGNOSIS — M5187 Other intervertebral disc disorders, lumbosacral region: Secondary | ICD-10-CM | POA: Diagnosis not present

## 2022-10-15 DIAGNOSIS — M4807 Spinal stenosis, lumbosacral region: Secondary | ICD-10-CM | POA: Diagnosis not present

## 2022-10-22 DIAGNOSIS — R2689 Other abnormalities of gait and mobility: Secondary | ICD-10-CM | POA: Diagnosis not present

## 2022-10-22 DIAGNOSIS — M25652 Stiffness of left hip, not elsewhere classified: Secondary | ICD-10-CM | POA: Diagnosis not present

## 2022-10-22 DIAGNOSIS — M25651 Stiffness of right hip, not elsewhere classified: Secondary | ICD-10-CM | POA: Diagnosis not present

## 2022-10-22 DIAGNOSIS — M4807 Spinal stenosis, lumbosacral region: Secondary | ICD-10-CM | POA: Diagnosis not present

## 2022-10-22 DIAGNOSIS — M5187 Other intervertebral disc disorders, lumbosacral region: Secondary | ICD-10-CM | POA: Diagnosis not present

## 2022-10-25 DIAGNOSIS — M25651 Stiffness of right hip, not elsewhere classified: Secondary | ICD-10-CM | POA: Diagnosis not present

## 2022-10-25 DIAGNOSIS — M25652 Stiffness of left hip, not elsewhere classified: Secondary | ICD-10-CM | POA: Diagnosis not present

## 2022-10-25 DIAGNOSIS — M5187 Other intervertebral disc disorders, lumbosacral region: Secondary | ICD-10-CM | POA: Diagnosis not present

## 2022-10-25 DIAGNOSIS — R2689 Other abnormalities of gait and mobility: Secondary | ICD-10-CM | POA: Diagnosis not present

## 2022-10-25 DIAGNOSIS — M4807 Spinal stenosis, lumbosacral region: Secondary | ICD-10-CM | POA: Diagnosis not present

## 2022-10-29 DIAGNOSIS — M5187 Other intervertebral disc disorders, lumbosacral region: Secondary | ICD-10-CM | POA: Diagnosis not present

## 2022-10-29 DIAGNOSIS — M4807 Spinal stenosis, lumbosacral region: Secondary | ICD-10-CM | POA: Diagnosis not present

## 2022-10-29 DIAGNOSIS — M25651 Stiffness of right hip, not elsewhere classified: Secondary | ICD-10-CM | POA: Diagnosis not present

## 2022-10-29 DIAGNOSIS — R2689 Other abnormalities of gait and mobility: Secondary | ICD-10-CM | POA: Diagnosis not present

## 2022-10-29 DIAGNOSIS — M25652 Stiffness of left hip, not elsewhere classified: Secondary | ICD-10-CM | POA: Diagnosis not present

## 2022-11-05 DIAGNOSIS — R2689 Other abnormalities of gait and mobility: Secondary | ICD-10-CM | POA: Diagnosis not present

## 2022-11-05 DIAGNOSIS — M25652 Stiffness of left hip, not elsewhere classified: Secondary | ICD-10-CM | POA: Diagnosis not present

## 2022-11-05 DIAGNOSIS — M4807 Spinal stenosis, lumbosacral region: Secondary | ICD-10-CM | POA: Diagnosis not present

## 2022-11-05 DIAGNOSIS — M25651 Stiffness of right hip, not elsewhere classified: Secondary | ICD-10-CM | POA: Diagnosis not present

## 2022-11-05 DIAGNOSIS — M5187 Other intervertebral disc disorders, lumbosacral region: Secondary | ICD-10-CM | POA: Diagnosis not present

## 2022-11-08 DIAGNOSIS — M25651 Stiffness of right hip, not elsewhere classified: Secondary | ICD-10-CM | POA: Diagnosis not present

## 2022-11-08 DIAGNOSIS — R2689 Other abnormalities of gait and mobility: Secondary | ICD-10-CM | POA: Diagnosis not present

## 2022-11-08 DIAGNOSIS — M4807 Spinal stenosis, lumbosacral region: Secondary | ICD-10-CM | POA: Diagnosis not present

## 2022-11-08 DIAGNOSIS — M25652 Stiffness of left hip, not elsewhere classified: Secondary | ICD-10-CM | POA: Diagnosis not present

## 2022-11-08 DIAGNOSIS — M5187 Other intervertebral disc disorders, lumbosacral region: Secondary | ICD-10-CM | POA: Diagnosis not present

## 2022-11-12 DIAGNOSIS — M25651 Stiffness of right hip, not elsewhere classified: Secondary | ICD-10-CM | POA: Diagnosis not present

## 2022-11-12 DIAGNOSIS — Z85828 Personal history of other malignant neoplasm of skin: Secondary | ICD-10-CM | POA: Diagnosis not present

## 2022-11-12 DIAGNOSIS — M5187 Other intervertebral disc disorders, lumbosacral region: Secondary | ICD-10-CM | POA: Diagnosis not present

## 2022-11-12 DIAGNOSIS — M4807 Spinal stenosis, lumbosacral region: Secondary | ICD-10-CM | POA: Diagnosis not present

## 2022-11-12 DIAGNOSIS — M25652 Stiffness of left hip, not elsewhere classified: Secondary | ICD-10-CM | POA: Diagnosis not present

## 2022-11-12 DIAGNOSIS — R2689 Other abnormalities of gait and mobility: Secondary | ICD-10-CM | POA: Diagnosis not present

## 2022-11-12 DIAGNOSIS — D1801 Hemangioma of skin and subcutaneous tissue: Secondary | ICD-10-CM | POA: Diagnosis not present

## 2022-11-12 DIAGNOSIS — L570 Actinic keratosis: Secondary | ICD-10-CM | POA: Diagnosis not present

## 2022-11-12 DIAGNOSIS — L821 Other seborrheic keratosis: Secondary | ICD-10-CM | POA: Diagnosis not present

## 2022-11-15 DIAGNOSIS — M25652 Stiffness of left hip, not elsewhere classified: Secondary | ICD-10-CM | POA: Diagnosis not present

## 2022-11-15 DIAGNOSIS — M25651 Stiffness of right hip, not elsewhere classified: Secondary | ICD-10-CM | POA: Diagnosis not present

## 2022-11-15 DIAGNOSIS — M4807 Spinal stenosis, lumbosacral region: Secondary | ICD-10-CM | POA: Diagnosis not present

## 2022-11-15 DIAGNOSIS — M5187 Other intervertebral disc disorders, lumbosacral region: Secondary | ICD-10-CM | POA: Diagnosis not present

## 2022-11-15 DIAGNOSIS — R2689 Other abnormalities of gait and mobility: Secondary | ICD-10-CM | POA: Diagnosis not present

## 2022-11-19 DIAGNOSIS — M4807 Spinal stenosis, lumbosacral region: Secondary | ICD-10-CM | POA: Diagnosis not present

## 2022-11-19 DIAGNOSIS — M5187 Other intervertebral disc disorders, lumbosacral region: Secondary | ICD-10-CM | POA: Diagnosis not present

## 2022-11-19 DIAGNOSIS — M25652 Stiffness of left hip, not elsewhere classified: Secondary | ICD-10-CM | POA: Diagnosis not present

## 2022-11-19 DIAGNOSIS — R2689 Other abnormalities of gait and mobility: Secondary | ICD-10-CM | POA: Diagnosis not present

## 2022-11-19 DIAGNOSIS — M25651 Stiffness of right hip, not elsewhere classified: Secondary | ICD-10-CM | POA: Diagnosis not present

## 2022-11-22 DIAGNOSIS — M25652 Stiffness of left hip, not elsewhere classified: Secondary | ICD-10-CM | POA: Diagnosis not present

## 2022-11-22 DIAGNOSIS — M4807 Spinal stenosis, lumbosacral region: Secondary | ICD-10-CM | POA: Diagnosis not present

## 2022-11-22 DIAGNOSIS — M5187 Other intervertebral disc disorders, lumbosacral region: Secondary | ICD-10-CM | POA: Diagnosis not present

## 2022-11-22 DIAGNOSIS — R2689 Other abnormalities of gait and mobility: Secondary | ICD-10-CM | POA: Diagnosis not present

## 2022-11-22 DIAGNOSIS — M25651 Stiffness of right hip, not elsewhere classified: Secondary | ICD-10-CM | POA: Diagnosis not present

## 2022-11-27 DIAGNOSIS — R2689 Other abnormalities of gait and mobility: Secondary | ICD-10-CM | POA: Diagnosis not present

## 2022-11-27 DIAGNOSIS — M25651 Stiffness of right hip, not elsewhere classified: Secondary | ICD-10-CM | POA: Diagnosis not present

## 2022-11-27 DIAGNOSIS — M4807 Spinal stenosis, lumbosacral region: Secondary | ICD-10-CM | POA: Diagnosis not present

## 2022-11-27 DIAGNOSIS — M5187 Other intervertebral disc disorders, lumbosacral region: Secondary | ICD-10-CM | POA: Diagnosis not present

## 2022-11-27 DIAGNOSIS — M25652 Stiffness of left hip, not elsewhere classified: Secondary | ICD-10-CM | POA: Diagnosis not present

## 2022-12-03 DIAGNOSIS — R2689 Other abnormalities of gait and mobility: Secondary | ICD-10-CM | POA: Diagnosis not present

## 2022-12-03 DIAGNOSIS — M4807 Spinal stenosis, lumbosacral region: Secondary | ICD-10-CM | POA: Diagnosis not present

## 2022-12-03 DIAGNOSIS — M25651 Stiffness of right hip, not elsewhere classified: Secondary | ICD-10-CM | POA: Diagnosis not present

## 2022-12-03 DIAGNOSIS — M25652 Stiffness of left hip, not elsewhere classified: Secondary | ICD-10-CM | POA: Diagnosis not present

## 2022-12-03 DIAGNOSIS — M5187 Other intervertebral disc disorders, lumbosacral region: Secondary | ICD-10-CM | POA: Diagnosis not present

## 2022-12-04 ENCOUNTER — Other Ambulatory Visit: Payer: Self-pay | Admitting: Family Medicine

## 2022-12-04 DIAGNOSIS — E782 Mixed hyperlipidemia: Secondary | ICD-10-CM

## 2022-12-06 DIAGNOSIS — M5187 Other intervertebral disc disorders, lumbosacral region: Secondary | ICD-10-CM | POA: Diagnosis not present

## 2022-12-06 DIAGNOSIS — M25652 Stiffness of left hip, not elsewhere classified: Secondary | ICD-10-CM | POA: Diagnosis not present

## 2022-12-06 DIAGNOSIS — M25651 Stiffness of right hip, not elsewhere classified: Secondary | ICD-10-CM | POA: Diagnosis not present

## 2022-12-06 DIAGNOSIS — M4807 Spinal stenosis, lumbosacral region: Secondary | ICD-10-CM | POA: Diagnosis not present

## 2022-12-06 DIAGNOSIS — R2689 Other abnormalities of gait and mobility: Secondary | ICD-10-CM | POA: Diagnosis not present

## 2022-12-12 DIAGNOSIS — M25651 Stiffness of right hip, not elsewhere classified: Secondary | ICD-10-CM | POA: Diagnosis not present

## 2022-12-12 DIAGNOSIS — R2689 Other abnormalities of gait and mobility: Secondary | ICD-10-CM | POA: Diagnosis not present

## 2022-12-12 DIAGNOSIS — M5187 Other intervertebral disc disorders, lumbosacral region: Secondary | ICD-10-CM | POA: Diagnosis not present

## 2022-12-12 DIAGNOSIS — M4807 Spinal stenosis, lumbosacral region: Secondary | ICD-10-CM | POA: Diagnosis not present

## 2022-12-12 DIAGNOSIS — M25652 Stiffness of left hip, not elsewhere classified: Secondary | ICD-10-CM | POA: Diagnosis not present

## 2022-12-14 DIAGNOSIS — M25651 Stiffness of right hip, not elsewhere classified: Secondary | ICD-10-CM | POA: Diagnosis not present

## 2022-12-14 DIAGNOSIS — M25652 Stiffness of left hip, not elsewhere classified: Secondary | ICD-10-CM | POA: Diagnosis not present

## 2022-12-14 DIAGNOSIS — M5187 Other intervertebral disc disorders, lumbosacral region: Secondary | ICD-10-CM | POA: Diagnosis not present

## 2022-12-14 DIAGNOSIS — R2689 Other abnormalities of gait and mobility: Secondary | ICD-10-CM | POA: Diagnosis not present

## 2022-12-14 DIAGNOSIS — M4807 Spinal stenosis, lumbosacral region: Secondary | ICD-10-CM | POA: Diagnosis not present

## 2022-12-17 DIAGNOSIS — M5187 Other intervertebral disc disorders, lumbosacral region: Secondary | ICD-10-CM | POA: Diagnosis not present

## 2022-12-17 DIAGNOSIS — M25652 Stiffness of left hip, not elsewhere classified: Secondary | ICD-10-CM | POA: Diagnosis not present

## 2022-12-17 DIAGNOSIS — M4807 Spinal stenosis, lumbosacral region: Secondary | ICD-10-CM | POA: Diagnosis not present

## 2022-12-17 DIAGNOSIS — R2689 Other abnormalities of gait and mobility: Secondary | ICD-10-CM | POA: Diagnosis not present

## 2022-12-17 DIAGNOSIS — M25651 Stiffness of right hip, not elsewhere classified: Secondary | ICD-10-CM | POA: Diagnosis not present

## 2022-12-24 DIAGNOSIS — M5187 Other intervertebral disc disorders, lumbosacral region: Secondary | ICD-10-CM | POA: Diagnosis not present

## 2022-12-24 DIAGNOSIS — M25651 Stiffness of right hip, not elsewhere classified: Secondary | ICD-10-CM | POA: Diagnosis not present

## 2022-12-24 DIAGNOSIS — M4807 Spinal stenosis, lumbosacral region: Secondary | ICD-10-CM | POA: Diagnosis not present

## 2022-12-24 DIAGNOSIS — M25652 Stiffness of left hip, not elsewhere classified: Secondary | ICD-10-CM | POA: Diagnosis not present

## 2022-12-24 DIAGNOSIS — R2689 Other abnormalities of gait and mobility: Secondary | ICD-10-CM | POA: Diagnosis not present

## 2022-12-31 ENCOUNTER — Other Ambulatory Visit: Payer: Self-pay | Admitting: Family Medicine

## 2022-12-31 DIAGNOSIS — I10 Essential (primary) hypertension: Secondary | ICD-10-CM

## 2023-01-01 NOTE — Progress Notes (Unsigned)
No chief complaint on file.  Patient presents for 6 month follow-up on chronic problems.  Hypertension.  Compliant with losartan-HCT 100-25 and atenolol 25mg , denies side effects. She continues to get up 2-3 times/night to void, unchanged for years.  Denies incontinence at night, and is able to get back to sleep. Some leakage only if she holds her urine for too long, during the day. She denies HA, dizziness, chest pain, muscle cramps, edema or shortness of breath. BP's aren't checked elsewhere.  BP Readings from Last 3 Encounters:  06/27/22 128/76  12/11/21 134/72  06/08/21 132/80       Hyperlipidemia follow-up:  Patient is reportedly following a low-fat, low cholesterol diet. Compliant with medications (simvastatin 20mg ) and denies medication side effects. She denies any changes to her diet, mainly eats a lot of chicken and fish. Lipids were at goal on last check.  She also had aortic atherosclerosis noted on CT 01/2021.   Lab Results  Component Value Date   CHOL 164 06/27/2022   HDL 73 06/27/2022   LDLCALC 76 06/27/2022   TRIG 81 06/27/2022   CHOLHDL 2.2 06/27/2022       Hypothyroidism: She reports compliance with taking medication on empty stomach. She denies changes to hair/skin/nail, energy/weight/moods. She has long h/o IBS, no significant bowel changes (notes that she passes some smaller amounts more frequently). Align helps her bowels. No thinning or changes to hair on head, but notes the ongoing lack of hair on legs/arms (no longer needs to shave her legs).   Lab Results  Component Value Date   TSH 1.280 06/27/2022      Allergies: year-round, controlled with flonase and allegra daily.  Currently doing well, occasional sneeze.   Exudative macular degeneration--sees Dr. Gwendalyn Ege regularly. Previously had injections into L eye, none in over 2 years. She hasn't noticed significant change in vision (slight change in prescription of her glasses). Not driving much at night.    She continues to get PT through [facility] ([facility]) , 2x/week for an hour, to help with low back/hip pain and balance. Tylenol is effective for her pain, when needed--hasn't had any need for tylenol in a while.  UPDATE--not seen since early July???***  Sees dermatologist regularly, has h/o many BCCs.   Grief--she continues to see Emiliano Dyer for counseling every 2 weeks. She enjoys talking about things with her (girls fighting, her worries, etc). She denies feeling sad.   She has gotten out of the habit of going to church, plans to look at different churches. She still occasionally goes to a grief program at [facility], not in the last few months. She plays canasta and bridge with her old neighborhood friends.  UPDATE ***   PMH, PSH, SH reviewed    ROS:  No fever, chills, URI symptoms, chest pain, headaches, dizziness, syncope, no nausea, vomiting, heartburn, urinary complaints (just frequency).  No edema. Denies abdominal pain, blood in the stool. No thyroid complaints   Some bilateral low back/hip pan, as per HPI, improving with PT and controlled with Tylenol. Denies bleeding, bruising, rash. Moods are good. Allergies are well controlled.   PHYSICAL EXAM:  There were no vitals taken for this visit.  Wt Readings from Last 3 Encounters:  06/27/22 137 lb (62.1 kg)  12/11/21 140 lb 6.4 oz (63.7 kg)  06/08/21 131 lb 12.8 oz (59.8 kg)   Well developed, pleasant female in no distress HEENT:  PERRL, EOMI, conjunctiva clear. OP clear, sinuses nontender. Neck: no lymphadenopathy, thyromegaly  or carotid bruit. Heart: regular rate and rhythm, no murmur Lungs: clear bilaterally Abdomen: soft, nontender, no organomegaly or mass Back: no CVA or spinal tenderness. No SI tenderness or muscle spasm Extremities: No edema, 2+ pulses Skin: normal turgor, no rashes, lesions. Psych: normal mood, affect, hygiene and grooming. Neuro: alert and oriented. Normal strength,  gait.   ASSESSMENT/PLAN:  TSH only if sx Changed labs--only need b-met due to low Na on last check.  F/u for CPE in April, as scheduled, fasting.

## 2023-01-02 ENCOUNTER — Encounter: Payer: Self-pay | Admitting: Family Medicine

## 2023-01-02 ENCOUNTER — Ambulatory Visit (INDEPENDENT_AMBULATORY_CARE_PROVIDER_SITE_OTHER): Payer: Medicare Other | Admitting: Family Medicine

## 2023-01-02 VITALS — BP 116/70 | HR 60 | Ht 64.0 in | Wt 136.6 lb

## 2023-01-02 DIAGNOSIS — E871 Hypo-osmolality and hyponatremia: Secondary | ICD-10-CM

## 2023-01-02 DIAGNOSIS — Z23 Encounter for immunization: Secondary | ICD-10-CM

## 2023-01-02 DIAGNOSIS — Z5181 Encounter for therapeutic drug level monitoring: Secondary | ICD-10-CM

## 2023-01-02 DIAGNOSIS — F419 Anxiety disorder, unspecified: Secondary | ICD-10-CM | POA: Diagnosis not present

## 2023-01-02 DIAGNOSIS — I10 Essential (primary) hypertension: Secondary | ICD-10-CM

## 2023-01-02 DIAGNOSIS — E039 Hypothyroidism, unspecified: Secondary | ICD-10-CM

## 2023-01-02 DIAGNOSIS — E782 Mixed hyperlipidemia: Secondary | ICD-10-CM

## 2023-01-02 MED ORDER — ATENOLOL 25 MG PO TABS: 25.0000 mg | ORAL_TABLET | Freq: Every day | ORAL | 1 refills | Status: DC

## 2023-01-02 MED ORDER — LOSARTAN POTASSIUM-HCTZ 100-25 MG PO TABS
1.0000 | ORAL_TABLET | Freq: Every day | ORAL | 1 refills | Status: DC
Start: 2023-01-02 — End: 2023-06-19

## 2023-01-02 MED ORDER — LEVOTHYROXINE SODIUM 25 MCG PO TABS
25.0000 ug | ORAL_TABLET | Freq: Every day | ORAL | 1 refills | Status: DC
Start: 2023-01-02 — End: 2023-10-28

## 2023-01-03 ENCOUNTER — Encounter: Payer: Self-pay | Admitting: Family Medicine

## 2023-01-03 LAB — BASIC METABOLIC PANEL
BUN/Creatinine Ratio: 20 (ref 12–28)
BUN: 14 mg/dL (ref 8–27)
CO2: 26 mmol/L (ref 20–29)
Calcium: 9.9 mg/dL (ref 8.7–10.3)
Chloride: 96 mmol/L (ref 96–106)
Creatinine, Ser: 0.69 mg/dL (ref 0.57–1.00)
Glucose: 96 mg/dL (ref 70–99)
Potassium: 4.1 mmol/L (ref 3.5–5.2)
Sodium: 136 mmol/L (ref 134–144)
eGFR: 84 mL/min/{1.73_m2} (ref >59)

## 2023-01-14 DIAGNOSIS — M5187 Other intervertebral disc disorders, lumbosacral region: Secondary | ICD-10-CM | POA: Diagnosis not present

## 2023-01-14 DIAGNOSIS — R2689 Other abnormalities of gait and mobility: Secondary | ICD-10-CM | POA: Diagnosis not present

## 2023-01-14 DIAGNOSIS — M4807 Spinal stenosis, lumbosacral region: Secondary | ICD-10-CM | POA: Diagnosis not present

## 2023-01-14 DIAGNOSIS — M25652 Stiffness of left hip, not elsewhere classified: Secondary | ICD-10-CM | POA: Diagnosis not present

## 2023-01-14 DIAGNOSIS — M25651 Stiffness of right hip, not elsewhere classified: Secondary | ICD-10-CM | POA: Diagnosis not present

## 2023-01-17 DIAGNOSIS — R2689 Other abnormalities of gait and mobility: Secondary | ICD-10-CM | POA: Diagnosis not present

## 2023-01-17 DIAGNOSIS — M25652 Stiffness of left hip, not elsewhere classified: Secondary | ICD-10-CM | POA: Diagnosis not present

## 2023-01-17 DIAGNOSIS — M4807 Spinal stenosis, lumbosacral region: Secondary | ICD-10-CM | POA: Diagnosis not present

## 2023-01-17 DIAGNOSIS — M25651 Stiffness of right hip, not elsewhere classified: Secondary | ICD-10-CM | POA: Diagnosis not present

## 2023-01-17 DIAGNOSIS — M5187 Other intervertebral disc disorders, lumbosacral region: Secondary | ICD-10-CM | POA: Diagnosis not present

## 2023-01-21 DIAGNOSIS — R2689 Other abnormalities of gait and mobility: Secondary | ICD-10-CM | POA: Diagnosis not present

## 2023-01-21 DIAGNOSIS — M25652 Stiffness of left hip, not elsewhere classified: Secondary | ICD-10-CM | POA: Diagnosis not present

## 2023-01-21 DIAGNOSIS — M25651 Stiffness of right hip, not elsewhere classified: Secondary | ICD-10-CM | POA: Diagnosis not present

## 2023-01-21 DIAGNOSIS — M5187 Other intervertebral disc disorders, lumbosacral region: Secondary | ICD-10-CM | POA: Diagnosis not present

## 2023-01-21 DIAGNOSIS — M4807 Spinal stenosis, lumbosacral region: Secondary | ICD-10-CM | POA: Diagnosis not present

## 2023-01-24 DIAGNOSIS — M5187 Other intervertebral disc disorders, lumbosacral region: Secondary | ICD-10-CM | POA: Diagnosis not present

## 2023-01-24 DIAGNOSIS — M25652 Stiffness of left hip, not elsewhere classified: Secondary | ICD-10-CM | POA: Diagnosis not present

## 2023-01-24 DIAGNOSIS — M25651 Stiffness of right hip, not elsewhere classified: Secondary | ICD-10-CM | POA: Diagnosis not present

## 2023-01-24 DIAGNOSIS — M4807 Spinal stenosis, lumbosacral region: Secondary | ICD-10-CM | POA: Diagnosis not present

## 2023-01-24 DIAGNOSIS — R2689 Other abnormalities of gait and mobility: Secondary | ICD-10-CM | POA: Diagnosis not present

## 2023-01-31 DIAGNOSIS — R2689 Other abnormalities of gait and mobility: Secondary | ICD-10-CM | POA: Diagnosis not present

## 2023-01-31 DIAGNOSIS — D3132 Benign neoplasm of left choroid: Secondary | ICD-10-CM | POA: Diagnosis not present

## 2023-01-31 DIAGNOSIS — M4807 Spinal stenosis, lumbosacral region: Secondary | ICD-10-CM | POA: Diagnosis not present

## 2023-01-31 DIAGNOSIS — M5187 Other intervertebral disc disorders, lumbosacral region: Secondary | ICD-10-CM | POA: Diagnosis not present

## 2023-01-31 DIAGNOSIS — H353112 Nonexudative age-related macular degeneration, right eye, intermediate dry stage: Secondary | ICD-10-CM | POA: Diagnosis not present

## 2023-01-31 DIAGNOSIS — Z961 Presence of intraocular lens: Secondary | ICD-10-CM | POA: Diagnosis not present

## 2023-01-31 DIAGNOSIS — H353221 Exudative age-related macular degeneration, left eye, with active choroidal neovascularization: Secondary | ICD-10-CM | POA: Diagnosis not present

## 2023-01-31 DIAGNOSIS — M25651 Stiffness of right hip, not elsewhere classified: Secondary | ICD-10-CM | POA: Diagnosis not present

## 2023-01-31 DIAGNOSIS — H26493 Other secondary cataract, bilateral: Secondary | ICD-10-CM | POA: Diagnosis not present

## 2023-01-31 DIAGNOSIS — M25652 Stiffness of left hip, not elsewhere classified: Secondary | ICD-10-CM | POA: Diagnosis not present

## 2023-02-04 DIAGNOSIS — R2689 Other abnormalities of gait and mobility: Secondary | ICD-10-CM | POA: Diagnosis not present

## 2023-02-04 DIAGNOSIS — M25651 Stiffness of right hip, not elsewhere classified: Secondary | ICD-10-CM | POA: Diagnosis not present

## 2023-02-04 DIAGNOSIS — M4807 Spinal stenosis, lumbosacral region: Secondary | ICD-10-CM | POA: Diagnosis not present

## 2023-02-04 DIAGNOSIS — M25652 Stiffness of left hip, not elsewhere classified: Secondary | ICD-10-CM | POA: Diagnosis not present

## 2023-02-04 DIAGNOSIS — M5187 Other intervertebral disc disorders, lumbosacral region: Secondary | ICD-10-CM | POA: Diagnosis not present

## 2023-02-07 DIAGNOSIS — M4807 Spinal stenosis, lumbosacral region: Secondary | ICD-10-CM | POA: Diagnosis not present

## 2023-02-07 DIAGNOSIS — M25651 Stiffness of right hip, not elsewhere classified: Secondary | ICD-10-CM | POA: Diagnosis not present

## 2023-02-07 DIAGNOSIS — R2689 Other abnormalities of gait and mobility: Secondary | ICD-10-CM | POA: Diagnosis not present

## 2023-02-07 DIAGNOSIS — M5187 Other intervertebral disc disorders, lumbosacral region: Secondary | ICD-10-CM | POA: Diagnosis not present

## 2023-02-07 DIAGNOSIS — M25652 Stiffness of left hip, not elsewhere classified: Secondary | ICD-10-CM | POA: Diagnosis not present

## 2023-02-11 DIAGNOSIS — M4807 Spinal stenosis, lumbosacral region: Secondary | ICD-10-CM | POA: Diagnosis not present

## 2023-02-11 DIAGNOSIS — R2689 Other abnormalities of gait and mobility: Secondary | ICD-10-CM | POA: Diagnosis not present

## 2023-02-11 DIAGNOSIS — M25652 Stiffness of left hip, not elsewhere classified: Secondary | ICD-10-CM | POA: Diagnosis not present

## 2023-02-11 DIAGNOSIS — M5187 Other intervertebral disc disorders, lumbosacral region: Secondary | ICD-10-CM | POA: Diagnosis not present

## 2023-02-11 DIAGNOSIS — M25651 Stiffness of right hip, not elsewhere classified: Secondary | ICD-10-CM | POA: Diagnosis not present

## 2023-02-14 ENCOUNTER — Ambulatory Visit: Payer: Medicare Other | Admitting: Family Medicine

## 2023-02-14 ENCOUNTER — Encounter: Payer: Self-pay | Admitting: Family Medicine

## 2023-02-14 VITALS — BP 130/76 | HR 67 | Temp 98.0°F | Ht 64.0 in | Wt 137.2 lb

## 2023-02-14 DIAGNOSIS — R051 Acute cough: Secondary | ICD-10-CM

## 2023-02-14 DIAGNOSIS — J309 Allergic rhinitis, unspecified: Secondary | ICD-10-CM | POA: Diagnosis not present

## 2023-02-14 DIAGNOSIS — J3489 Other specified disorders of nose and nasal sinuses: Secondary | ICD-10-CM | POA: Diagnosis not present

## 2023-02-14 LAB — POC COVID19 BINAXNOW: SARS Coronavirus 2 Ag: NEGATIVE

## 2023-02-14 NOTE — Patient Instructions (Signed)
There was no evidence of any sinus infection, bronchitis or pneumonia. Continue to use the Allegra and the Flonase. In addition, start taking Mucinex (plain--the only ingredient is guaifenesin).  If you are able to swallow large pills, I recommend getting the 12 hour Mucinex, and taking it twice daily.  This helps loosen up the phlegm so that it is easier to swallow and causes less cough.  Since the mucus and phlegm isn't consistently discolored (fluctuates with white and clear), I don't think an antibiotic is needed at this time. If you notice a more consistent discolored phlegm, let us know, and we can send in an antibiotic.  Of course if you develop fever, worsening cough, shortness of breath, pain with breathing or other new symptoms, please return for re-evaluation.

## 2023-02-14 NOTE — Progress Notes (Signed)
Chief Complaint  Patient presents with   Nasal Congestion    For the last 10 days she has been having drainage that she has been coughing up that has been clear, the oast 4-5 days changed to yellow and then this am was green in color. Slight cough, no ST. Just doesn't feel good.    10 days ago she started having postnasal drainage, like a tickle in her throat. It causes her to keep coughing until she can get it up.  Denies any sneezing, itchy/water eyes.  She does blow her nose often. Nasal drainage is clear, and has remained clear. The phlegm that she coughs up started out as clear, and is now getting more discolored. Phlegm is thick, usually white or clear.  Sometimes sees a little yellow, which comes and goes throughout the day.  Isn't persistently yellow during the day. This morning she noticed some green phlegm. She hasn't coughed up any phlegm since then. Nasal drainage is still clear.  She denies sinus pain, headaches. She denies sore throat. She takes Careers adviser and flonase daily. Hasn't tried anything for the cough.    PMH, PSH, SH reviewed  Outpatient Encounter Medications as of 02/14/2023  Medication Sig Note   atenolol (TENORMIN) 25 MG tablet Take 1 tablet (25 mg total) by mouth daily.    Calcium Carbonate (CALTRATE 600 PO) Take 1 tablet by mouth daily.    fexofenadine (ALLEGRA) 180 MG tablet Take 180 mg by mouth daily.    fluticasone (FLONASE) 50 MCG/ACT nasal spray SPRAY TWO SPRAYS IN EACH NOSTRIL ONCE DAILY    GLUCOSAMINE PO Take 2,000 mg by mouth 2 (two) times daily.    levothyroxine (SYNTHROID) 25 MCG tablet Take 1 tablet (25 mcg total) by mouth daily.    losartan-hydrochlorothiazide (HYZAAR) 100-25 MG tablet Take 1 tablet by mouth daily.    Multiple Vitamins-Minerals (CENTRUM SILVER PO) Take 1 tablet by mouth daily.    Multiple Vitamins-Minerals (PRESERVISION/LUTEIN) CAPS Take 1 capsule by mouth 2 (two) times daily.    Probiotic Product (ALIGN PO) Take 1 capsule by  mouth daily.    simvastatin (ZOCOR) 20 MG tablet TAKE 1 TABLET BY MOUTH AT BEDTIME    acetaminophen (TYLENOL) 650 MG CR tablet Take 1,300 mg by mouth every 8 (eight) hours as needed for pain. (Patient not taking: Reported on 06/27/2022) 02/14/2023: As needed, rarely   No facility-administered encounter medications on file as of 02/14/2023.   Allergies  Allergen Reactions   Percocet [Oxycodone-Acetaminophen] Other (See Comments)    Upset stomach    ROS: See HPI. No f/c. No n/v/d No shortness of breath or wheezing No chest pain.    PHYSICAL EXAM:  BP 130/76   Pulse 67   Temp 98 F (36.7 C) (Tympanic)   Ht 5\' 4"  (1.626 m)   Wt 137 lb 3.2 oz (62.2 kg)   BMI 23.55 kg/m   Well-appearing, pleasant female, in good spirits. No coughing during visit, no sniffling or throat-clearing.  She is speaking easily and comfortably. HEENT: conjunctiva and sclera are clear, EOMI. Nasal mucosa appears normal, no erythema or purulence. Sinuses nontender. OP is clear TM's and EAC's are normal Neck: no lymphadenopathy or mass Heart: regular rate and rhythm Lungs: clear bilaterally, good air movement. No wheezes, rales or ronchi. Psych: normal mood, affect, hygiene and grooming Neuro: alert and oriented, cranial nerves grossly intact.  Normal gait.  COVID test negative   ASSESSMENT/PLAN:  Allergic rhinitis, unspecified seasonality, unspecified trigger  Nasal drainage -  Plan: POC COVID-19  Acute cough - Plan: POC COVID-19  There was no evidence of any sinus infection, bronchitis or pneumonia. Continue to use the Allegra and the Flonase. In addition, start taking Mucinex (plain--the only ingredient is guaifenesin).  If you are able to swallow large pills, I recommend getting the 12 hour Mucinex, and taking it twice daily.  This helps loosen up the phlegm so that it is easier to swallow and causes less cough.  Since the mucus and phlegm isn't consistently discolored (fluctuates with  white and clear), I don't think an antibiotic is needed at this time. If you notice a more consistent discolored phlegm, let us know, and we can send in an antibiotic.  Of course if you develop fever, worsening cough, shortness of breath, pain with breathing or other new symptoms, please return for re-evaluation.

## 2023-02-15 DIAGNOSIS — M25651 Stiffness of right hip, not elsewhere classified: Secondary | ICD-10-CM | POA: Diagnosis not present

## 2023-02-15 DIAGNOSIS — M5187 Other intervertebral disc disorders, lumbosacral region: Secondary | ICD-10-CM | POA: Diagnosis not present

## 2023-02-15 DIAGNOSIS — M4807 Spinal stenosis, lumbosacral region: Secondary | ICD-10-CM | POA: Diagnosis not present

## 2023-02-15 DIAGNOSIS — M25652 Stiffness of left hip, not elsewhere classified: Secondary | ICD-10-CM | POA: Diagnosis not present

## 2023-02-15 DIAGNOSIS — R2689 Other abnormalities of gait and mobility: Secondary | ICD-10-CM | POA: Diagnosis not present

## 2023-02-18 DIAGNOSIS — M4807 Spinal stenosis, lumbosacral region: Secondary | ICD-10-CM | POA: Diagnosis not present

## 2023-02-18 DIAGNOSIS — M25651 Stiffness of right hip, not elsewhere classified: Secondary | ICD-10-CM | POA: Diagnosis not present

## 2023-02-18 DIAGNOSIS — M5187 Other intervertebral disc disorders, lumbosacral region: Secondary | ICD-10-CM | POA: Diagnosis not present

## 2023-02-18 DIAGNOSIS — M25652 Stiffness of left hip, not elsewhere classified: Secondary | ICD-10-CM | POA: Diagnosis not present

## 2023-02-18 DIAGNOSIS — R2689 Other abnormalities of gait and mobility: Secondary | ICD-10-CM | POA: Diagnosis not present

## 2023-02-20 DIAGNOSIS — M5187 Other intervertebral disc disorders, lumbosacral region: Secondary | ICD-10-CM | POA: Diagnosis not present

## 2023-02-20 DIAGNOSIS — M4807 Spinal stenosis, lumbosacral region: Secondary | ICD-10-CM | POA: Diagnosis not present

## 2023-02-20 DIAGNOSIS — M25652 Stiffness of left hip, not elsewhere classified: Secondary | ICD-10-CM | POA: Diagnosis not present

## 2023-02-20 DIAGNOSIS — M25651 Stiffness of right hip, not elsewhere classified: Secondary | ICD-10-CM | POA: Diagnosis not present

## 2023-02-20 DIAGNOSIS — R2689 Other abnormalities of gait and mobility: Secondary | ICD-10-CM | POA: Diagnosis not present

## 2023-03-02 ENCOUNTER — Other Ambulatory Visit: Payer: Self-pay | Admitting: Family Medicine

## 2023-03-02 DIAGNOSIS — E782 Mixed hyperlipidemia: Secondary | ICD-10-CM

## 2023-03-04 DIAGNOSIS — M25651 Stiffness of right hip, not elsewhere classified: Secondary | ICD-10-CM | POA: Diagnosis not present

## 2023-03-04 DIAGNOSIS — M25652 Stiffness of left hip, not elsewhere classified: Secondary | ICD-10-CM | POA: Diagnosis not present

## 2023-03-04 DIAGNOSIS — M5187 Other intervertebral disc disorders, lumbosacral region: Secondary | ICD-10-CM | POA: Diagnosis not present

## 2023-03-04 DIAGNOSIS — R2689 Other abnormalities of gait and mobility: Secondary | ICD-10-CM | POA: Diagnosis not present

## 2023-03-04 DIAGNOSIS — M4807 Spinal stenosis, lumbosacral region: Secondary | ICD-10-CM | POA: Diagnosis not present

## 2023-03-05 ENCOUNTER — Other Ambulatory Visit: Payer: Self-pay | Admitting: Family Medicine

## 2023-03-07 DIAGNOSIS — M5187 Other intervertebral disc disorders, lumbosacral region: Secondary | ICD-10-CM | POA: Diagnosis not present

## 2023-03-07 DIAGNOSIS — M4807 Spinal stenosis, lumbosacral region: Secondary | ICD-10-CM | POA: Diagnosis not present

## 2023-03-07 DIAGNOSIS — M25651 Stiffness of right hip, not elsewhere classified: Secondary | ICD-10-CM | POA: Diagnosis not present

## 2023-03-07 DIAGNOSIS — M25652 Stiffness of left hip, not elsewhere classified: Secondary | ICD-10-CM | POA: Diagnosis not present

## 2023-03-07 DIAGNOSIS — R2689 Other abnormalities of gait and mobility: Secondary | ICD-10-CM | POA: Diagnosis not present

## 2023-03-11 DIAGNOSIS — R2689 Other abnormalities of gait and mobility: Secondary | ICD-10-CM | POA: Diagnosis not present

## 2023-03-11 DIAGNOSIS — M5187 Other intervertebral disc disorders, lumbosacral region: Secondary | ICD-10-CM | POA: Diagnosis not present

## 2023-03-11 DIAGNOSIS — M4807 Spinal stenosis, lumbosacral region: Secondary | ICD-10-CM | POA: Diagnosis not present

## 2023-03-11 DIAGNOSIS — M25652 Stiffness of left hip, not elsewhere classified: Secondary | ICD-10-CM | POA: Diagnosis not present

## 2023-03-11 DIAGNOSIS — M25651 Stiffness of right hip, not elsewhere classified: Secondary | ICD-10-CM | POA: Diagnosis not present

## 2023-03-14 DIAGNOSIS — M5187 Other intervertebral disc disorders, lumbosacral region: Secondary | ICD-10-CM | POA: Diagnosis not present

## 2023-03-14 DIAGNOSIS — M25651 Stiffness of right hip, not elsewhere classified: Secondary | ICD-10-CM | POA: Diagnosis not present

## 2023-03-14 DIAGNOSIS — R2689 Other abnormalities of gait and mobility: Secondary | ICD-10-CM | POA: Diagnosis not present

## 2023-03-14 DIAGNOSIS — M4807 Spinal stenosis, lumbosacral region: Secondary | ICD-10-CM | POA: Diagnosis not present

## 2023-03-14 DIAGNOSIS — M25652 Stiffness of left hip, not elsewhere classified: Secondary | ICD-10-CM | POA: Diagnosis not present

## 2023-03-18 DIAGNOSIS — M5187 Other intervertebral disc disorders, lumbosacral region: Secondary | ICD-10-CM | POA: Diagnosis not present

## 2023-03-18 DIAGNOSIS — M25652 Stiffness of left hip, not elsewhere classified: Secondary | ICD-10-CM | POA: Diagnosis not present

## 2023-03-18 DIAGNOSIS — R2689 Other abnormalities of gait and mobility: Secondary | ICD-10-CM | POA: Diagnosis not present

## 2023-03-18 DIAGNOSIS — M4807 Spinal stenosis, lumbosacral region: Secondary | ICD-10-CM | POA: Diagnosis not present

## 2023-03-18 DIAGNOSIS — M25651 Stiffness of right hip, not elsewhere classified: Secondary | ICD-10-CM | POA: Diagnosis not present

## 2023-03-25 DIAGNOSIS — M25651 Stiffness of right hip, not elsewhere classified: Secondary | ICD-10-CM | POA: Diagnosis not present

## 2023-03-25 DIAGNOSIS — M4807 Spinal stenosis, lumbosacral region: Secondary | ICD-10-CM | POA: Diagnosis not present

## 2023-03-25 DIAGNOSIS — M25652 Stiffness of left hip, not elsewhere classified: Secondary | ICD-10-CM | POA: Diagnosis not present

## 2023-03-25 DIAGNOSIS — M5187 Other intervertebral disc disorders, lumbosacral region: Secondary | ICD-10-CM | POA: Diagnosis not present

## 2023-03-25 DIAGNOSIS — R2689 Other abnormalities of gait and mobility: Secondary | ICD-10-CM | POA: Diagnosis not present

## 2023-03-28 DIAGNOSIS — M25652 Stiffness of left hip, not elsewhere classified: Secondary | ICD-10-CM | POA: Diagnosis not present

## 2023-03-28 DIAGNOSIS — M5187 Other intervertebral disc disorders, lumbosacral region: Secondary | ICD-10-CM | POA: Diagnosis not present

## 2023-03-28 DIAGNOSIS — M25651 Stiffness of right hip, not elsewhere classified: Secondary | ICD-10-CM | POA: Diagnosis not present

## 2023-03-28 DIAGNOSIS — R2689 Other abnormalities of gait and mobility: Secondary | ICD-10-CM | POA: Diagnosis not present

## 2023-03-28 DIAGNOSIS — M4807 Spinal stenosis, lumbosacral region: Secondary | ICD-10-CM | POA: Diagnosis not present

## 2023-04-12 ENCOUNTER — Other Ambulatory Visit: Payer: Self-pay | Admitting: Family Medicine

## 2023-04-12 DIAGNOSIS — Z1231 Encounter for screening mammogram for malignant neoplasm of breast: Secondary | ICD-10-CM

## 2023-04-18 DIAGNOSIS — M25652 Stiffness of left hip, not elsewhere classified: Secondary | ICD-10-CM | POA: Diagnosis not present

## 2023-04-18 DIAGNOSIS — M4807 Spinal stenosis, lumbosacral region: Secondary | ICD-10-CM | POA: Diagnosis not present

## 2023-04-18 DIAGNOSIS — M25651 Stiffness of right hip, not elsewhere classified: Secondary | ICD-10-CM | POA: Diagnosis not present

## 2023-04-18 DIAGNOSIS — R2689 Other abnormalities of gait and mobility: Secondary | ICD-10-CM | POA: Diagnosis not present

## 2023-04-18 DIAGNOSIS — M5187 Other intervertebral disc disorders, lumbosacral region: Secondary | ICD-10-CM | POA: Diagnosis not present

## 2023-05-20 ENCOUNTER — Ambulatory Visit
Admission: RE | Admit: 2023-05-20 | Discharge: 2023-05-20 | Disposition: A | Discharge status: Home / Self Care | Payer: Medicare Other | Source: Ambulatory Visit | Attending: Family Medicine | Admitting: Family Medicine

## 2023-05-20 DIAGNOSIS — Z1231 Encounter for screening mammogram for malignant neoplasm of breast: Secondary | ICD-10-CM

## 2023-05-28 ENCOUNTER — Other Ambulatory Visit: Payer: Self-pay | Admitting: Family Medicine

## 2023-05-28 DIAGNOSIS — E782 Mixed hyperlipidemia: Secondary | ICD-10-CM

## 2023-06-03 DIAGNOSIS — R531 Weakness: Secondary | ICD-10-CM | POA: Diagnosis not present

## 2023-06-03 DIAGNOSIS — M545 Low back pain, unspecified: Secondary | ICD-10-CM | POA: Diagnosis not present

## 2023-06-19 ENCOUNTER — Other Ambulatory Visit: Payer: Self-pay | Admitting: Family Medicine

## 2023-06-19 DIAGNOSIS — I10 Essential (primary) hypertension: Secondary | ICD-10-CM

## 2023-07-01 NOTE — Progress Notes (Unsigned)
 No chief complaint on file.  Courtney Castro is a 89 y.o. female who presents for annual physical exam, Medicare wellness visit and follow-up on chronic medical conditions.    Hypertension.  Compliant with losartan-HCT 100-25 and atenolol 25mg , denies side effects. She continues to get up 2-3 times/night to void, unchanged for years. She drinks 64 ounces/day,sometimes still drinking water up until 8pm. She is able to get back to sleep. She no longer has any leakage/incontinence. She denies HA, dizziness, chest pain, palpitations, muscle cramps, edema or shortness of breath. BP's aren't checked elsewhere.   BP Readings from Last 3 Encounters:  02/14/23 130/76  01/02/23 116/70  06/27/22 128/76      Hyperlipidemia follow-up:  Patient is reportedly following a low-fat, low cholesterol diet. Compliant with medications (simvastatin 20mg ) and denies medication side effects. She denies any changes to her diet, mainly eats a lot of chicken and fish. Beef just a couple of times/month, rare pork. Lipids were at goal on this regimen last year, due for recheck.  She also had aortic atherosclerosis noted on CT 01/2021.   Lab Results  Component Value Date   CHOL 164 06/27/2022   HDL 73 06/27/2022   LDLCALC 76 06/27/2022   TRIG 81 06/27/2022   CHOLHDL 2.2 06/27/2022    Hypothyroidism: She reports compliance with taking medication on empty stomach. She denies changes to hair/skin/nail, energy/weight/moods. No change to bowels, has IBS, and taking Align helps. No thinning or changes to hair on head, but notes the ongoing lack of hair on legs/arms (no longer needs to shave legs or armpits). Due for TSH today.   Lab Results  Component Value Date   TSH 1.280 06/27/2022    Allergies: year-round, controlled with flonase and allegra daily.  She has some PND, clear. Denies any sneezing. Blows her nose just in the mornings. Denies any cough.   Exudative macular degeneration--sees Dr. Gwendalyn Ege regularly.  Previously had injections into L eye, none in over 2-3 years. She hasn't noticed significant change in vision (slight change in prescription of her glasses). Not driving much at night.   She continues to get PT through [facility] ([facility]) , 2x/week for an hour, to help with low back/hip pain and balance. Rarely needs any Tylenol (and it works, when needed, usually for her hip). ***UPDATE   Sees dermatologist regularly, has h/o many BCCs.     Grief--she continues to see Emiliano Dyer for counseling every 2 weeks. She enjoys talking about things with her. She denies feeling sad, doesn't cry.  No longer goes to grief group.   She still hasn't gotten back to church, but hopes to at some point. She plays canasta and bridge with her old neighborhood friends. Hopes to start with her current neighbors. She goes to the [employer] once a month at [employer] (where she previously went). ***UPDATE     H/o impaired fasting glucose: She tries to limit her sweets and carbs, has small portions.  Component Ref Range & Units (hover) 1 yr ago (06/27/22) 2 yr ago (06/08/21) 2 yr ago (02/09/21) 2 yr ago (01/29/21) 2 yr ago (01/28/21) 2 yr ago (01/28/21) 2 yr ago (01/27/21) 2 yr ago (01/27/21)  Glucose 99 103 High  144 High  99 CM  90 CM  107 High    Lab Results  Component Value Date   HGBA1C 5.7 (H) 01/20/2021    History of R breast cancer, s/p lumpectomy and radiation.  She last had mammo in  04/2023. Biopsy in 2018 showed fat necrosis with dense fibrosis (benign). 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 Normally colonoscopy would be recommended every 5 years, but due to age, this isn't being done.  Per visit with Dr. Ewing Schlein in May, 2021, recommendation is to check stool for blood.   Immunization History  Administered Date(s) Administered   Fluad Quad(high Dose 65+) 11/20/2018, 12/03/2019, 11/30/2020, 12/11/2021   Fluad Trivalent(High Dose 65+) 01/02/2023   Influenza Split  12/25/2002, 02/24/2004, 11/24/2005, 01/28/2009, 12/11/2009, 01/02/2011, 12/21/2011   Influenza, High Dose Seasonal PF 12/05/2012, 12/17/2013, 12/13/2014, 12/22/2015, 01/01/2017, 12/04/2017   PFIZER Comirnaty(Gray Top)Covid-19 Tri-Sucrose Vaccine 10/13/2020   PFIZER(Purple Top)SARS-COV-2 Vaccination 04/17/2019, 05/07/2019, 01/05/2020   Pfizer Covid-19 Vaccine Bivalent Booster 12yrs & up 12/22/2020   Pfizer(Comirnaty)Fall Seasonal Vaccine 12 years and older 02/05/2022, 06/27/2022, 12/15/2022   Pneumococcal Conjugate-13 03/04/2014   Pneumococcal Polysaccharide-23 11/07/2001, 01/31/2011, 05/30/2020   Respiratory Syncytial Virus Vaccine,Recomb Aduvanted(Arexvy) 01/06/2022   Td 12/18/2004   Td (Adult) 12/18/2004   Tdap 01/31/2011, 03/22/2017   Zoster Recombinant(Shingrix) 07/10/2016, 10/18/2016   Zoster, Live 08/25/2007   Last Pap smear: 2011, s/p hysterectomy for benign reasons   Last mammogram: 04/2023 Last colonoscopy:  June 2014 with Dr. Ewing Schlein. Negative FOB 08/2019 (Dr. Ewing Schlein), negative FIT testing 06/2020 here Last DEXA: 05/2016 normal Dentist: twice yearly   Ophtho: yearly; sees Dr. Gwendalyn Ege 2x/year  Exercise:   She goes to [facility] 2x/week.  Working on balance, uses bands, weights.  Not getting much cardio there, or elsewhere.  Patient Care Team: Joselyn Arrow, MD as PCP - General (Family Medicine) Janet Berlin, MD as Consulting Physician (Ophthalmology) Marvis Repress, MD as Referring Physician (Ophthalmology) Vida Rigger, MD as Consulting Physician (Gastroenterology) Bufford Buttner, MD as Referring Physician (Dermatology) Ollen Gross, MD as Consulting Physician (Orthopedic Surgery) Marcene Corning, MD as Consulting Physician (Orthopedic Surgery) Maeola Harman, MD as Consulting Physician (Neurosurgery) Donalee Citrin, MD as Consulting Physician (Neurosurgery) Dr. Corlis Hove as Consulting Physician (Dentistry) Podiatry: Dr. Harriet Pho (no longer sees)   Depression  Screening: Flowsheet Row Office Visit from 06/27/2022 in [facility]  PHQ-2 Total Score 0       Falls screen:     06/27/2022    9:43 AM 12/11/2021    9:45 AM 06/08/2021    9:34 AM 01/16/2021   11:30 AM 11/30/2020   11:24 AM  Fall Risk   Falls in the past year? 0 1 1 1  0  Number falls in past yr: 0 0 1 0 0  Comment  10/22 at spa in [place]  recent fall   Injury with Fall? 0 1 1 0 0  Comment  bruises, did not break anything fell at spa in Oct. Larey Seat again after that at home. (went to sit down on toilet at home). just bruising with both    Risk for fall due to : No Fall Risks History of fall(s) History of fall(s) History of fall(s) No Fall Risks  Follow up Falls evaluation completed Falls evaluation completed Falls evaluation completed Falls evaluation completed Falls evaluation completed     Functional Status Survey:   No major memory issues, just little things, mainly names (street names). Denies significant decline in the last year. Remembers better when she isn't worried/anxious about it.        End of Life Discussion:  Has living will and healthcare power of attorney forms. These are from 2007, and scanned into chart in 12/2019.  Husband was listed, but daughters also listed as [facility]  POA.    PMH, PSH, SH and FH were reviewed and updated     ROS: The patient denies anorexia, fever, weight changes, headaches, vision changes (stable), decreased hearing, ear pain, sore throat, breast concerns, chest pain, palpitations, dizziness, syncope, dyspnea on exertion, swelling, nausea, vomiting, diarrhea, constipation, abdominal pain, melena, hematochezia, indigestion/heartburn, hematuria, vaginal bleeding, genital lesions, joint pains, numbness, tingling, weakness, tremor, suspicious skin lesions, depression, anxiety, abnormal bleeding/bruising, or enlarged lymph nodes.   She denies any urinary complaints, just rare urge incontinence. Up 2-3 times/night to void, unchanged. Allergies are  controlled currently. Denies joint pains Bowels remain improved since taking Align, sometimes has small volumes more frequently, depending on her diet.    PHYSICAL EXAM:  There were no vitals taken for this visit.  Wt Readings from Last 3 Encounters:  02/14/23 137 lb 3.2 oz (62.2 kg)  01/02/23 136 lb 9.6 oz (62 kg)  06/27/22 137 lb (62.1 kg)   General Appearance:    Alert, cooperative, no distress, appears stated age     Head:    Normocephalic, atraumatic   Eyes:    PERRL, conjunctiva/corneas clear, EOM's intact, fundi benign--no hemorrhages, exudates or cataracts    Ears:    Normal TM's and external ear canals     Nose:    No drainage or sinus tenderness  Throat:    Normal mucosa  Neck:    Supple, no lymphadenopathy; thyroid: no enlargement/ tenderness/nodules; no carotid bruit or JVD     Back:    Spine nontender, no curvature, ROM normal, no CVA tenderness.   Lungs:    Clear to auscultation bilaterally without wheezes, rales or ronchi; respirations unlabored     Chest Wall:    No tenderness or deformity     Heart:    Regular rate and rhythm, S1 and S2 normal, no murmur, rub or gallop     Breast Exam:    No tenderness, masses, or nipple discharge or inversion. No axillary lymphadenopathy. WHSS R breast at 9 o'clock and in the right axilla. Nontender.  Some thickening at scars, unchanged. Left breast larger than right. Many SK's and some skin tags.  Abdomen:    Soft, non-tender, nondistended, normoactive bowel sounds,   no masses, no hepatosplenomegaly     Genitalia:    Normal external genitalia without lesions, +atrophic changes. BUS and vagina normal; Adnexa not palpable. Uterus surgically absent. No masses or tenderness. Pap not performed   ***  Rectal:    Normal tone, no masses or tenderness; guaiac negative stool  ***  Extremities:    No clubbing, cyanosis or edema. Arthritic changes to fingers  Pulses:    2+ and symmetric all extremities     Skin:    Skin color, texture, turgor  normal, no rashes or lesions. Many seborrheic keratosis throughout, especially on chest/abdomen. Some purpura on hands/forearms  Lymph nodes:    Cervical, supraclavicular, inguinal and axillary nodes normal     Neurologic:    Normal strength, sensation and gait; reflexes 2+ and symmetric throughout                          Psych:   Normal mood, affect, hygiene and grooming.  ***UPDATE if pelvic/rectal not done Update breast/skin  ASSESSMENT/PLAN:   Prevnar-20 Also due for COVID--needs to get from pharmacy, if desired FIT testing (stool)  Doesn't need pelvic/rectal if not having complaints.  C-met, cbc, lipids, TSH, A1c WITH LABS  Prefers letter/phone,  not MyChart  Discussed monthly self breast exams and yearly mammograms; at least 30 minutes of aerobic activity at least 5 days/week, weight-bearing exercise 2/week; proper sunscreen use reviewed; healthy diet, including goals of calcium and vitamin D intake and alcohol recommendations (less than or equal to 1 drink/day) reviewed; regular seatbelt use; changing batteries in smoke detectors. Immunization recommendations discussed--continue yearly high dose flu shots.  Prevnar-20 COVID booster recommended (to get from pharmacy, not available in office).  Colon cancer screening: heme negative stool on today's exam.    She is Full Code, Full Care. MOST form reviewed/signed  F/u 6 months for med check, sooner prn.    Medicare Attestation I have personally reviewed: The patient's medical and social history Their use of alcohol, tobacco or illicit drugs Their current medications and supplements The patient's functional ability including ADLs,fall risks, home safety risks, cognitive, and hearing and visual impairment Diet and physical activities Evidence for depression or mood disorders  The patient's weight, height, BMI have been recorded in the chart.  I have made referrals, counseling, and provided education to the patient based on  review of the above and I have provided the patient with a written personalized care plan for preventive services.

## 2023-07-01 NOTE — Patient Instructions (Incomplete)
  HEALTH MAINTENANCE RECOMMENDATIONS:  It is recommended that you get at least 30 minutes of aerobic exercise at least 5 days/week (for weight loss, you may need as much as 60-90 minutes). This can be any activity that gets your heart rate up. This can be divided in 10-15 minute intervals if needed, but try and build up your endurance at least once a week.  Weight bearing exercise is also recommended twice weekly.  Eat a healthy diet with lots of vegetables, fruits and fiber.  "Colorful" foods have a lot of vitamins (ie green vegetables, tomatoes, red peppers, etc).  Limit sweet tea, regular sodas and alcoholic beverages, all of which has a lot of calories and sugar.  Up to 1 alcoholic drink daily may be beneficial for women (unless trying to lose weight, watch sugars).  Drink a lot of water.  Calcium recommendations are 1200-1500 mg daily (1500 mg for postmenopausal women or women without ovaries), and vitamin D 1000 IU daily.  This should be obtained from diet and/or supplements (vitamins), and calcium should not be taken all at once, but in divided doses.  Monthly self breast exams and yearly mammograms for women over the age of 49 is recommended.  Sunscreen of at least SPF 30 should be used on all sun-exposed parts of the skin when outside between the hours of 10 am and 4 pm (not just when at beach or pool, but even with exercise, golf, tennis, and yard work!)  Use a sunscreen that says "broad spectrum" so it covers both UVA and UVB rays, and make sure to reapply every 1-2 hours.  Remember to change the batteries in your smoke detectors when changing your clock times in the spring and fall. Carbon monoxide detectors are recommended for your home.  Use your seat belt every time you are in a car, and please drive safely and not be distracted with cell phones and texting while driving.   Courtney Castro , Thank you for taking time to come for your Medicare Wellness Visit. I appreciate your ongoing  commitment to your health goals. Please review the following plan we discussed and let me know if I can assist you in the future.   This is a list of the screening recommended for you and due dates:  Health Maintenance  Topic Date Due   COVID-19 Vaccine (9 - 2024-25 season) 06/14/2023   Flu Shot  10/25/2023   Medicare Annual Wellness Visit  07/02/2024   DTaP/Tdap/Td vaccine (5 - Td or Tdap) 03/23/2027   Pneumonia Vaccine  Completed   DEXA scan (bone density measurement)  Completed   Zoster (Shingles) Vaccine  Completed   HPV Vaccine  Aged Out    Periodically check your blood pressure elsewhere, to ensure that it typically is running 130/80 or lower.  If it is consistently higher, please return and bring your list of blood pressures with you.  We discussed you trying to get more regular aerobic exercise (at home, while watching TV, either standing or seated, with arms overhead, or if you can use treadmill or bike at PT). Try and get 30 minutes/day, but can be just 10 minutes at a time, and work your way up.

## 2023-07-03 ENCOUNTER — Encounter: Payer: Self-pay | Admitting: Family Medicine

## 2023-07-03 ENCOUNTER — Ambulatory Visit: Payer: Medicare Other | Admitting: Family Medicine

## 2023-07-03 VITALS — BP 134/82 | HR 64 | Ht 64.0 in | Wt 135.6 lb

## 2023-07-03 DIAGNOSIS — Z853 Personal history of malignant neoplasm of breast: Secondary | ICD-10-CM

## 2023-07-03 DIAGNOSIS — E039 Hypothyroidism, unspecified: Secondary | ICD-10-CM

## 2023-07-03 DIAGNOSIS — R7301 Impaired fasting glucose: Secondary | ICD-10-CM | POA: Diagnosis not present

## 2023-07-03 DIAGNOSIS — D692 Other nonthrombocytopenic purpura: Secondary | ICD-10-CM

## 2023-07-03 DIAGNOSIS — Z23 Encounter for immunization: Secondary | ICD-10-CM | POA: Diagnosis not present

## 2023-07-03 DIAGNOSIS — H353221 Exudative age-related macular degeneration, left eye, with active choroidal neovascularization: Secondary | ICD-10-CM | POA: Diagnosis not present

## 2023-07-03 DIAGNOSIS — I10 Essential (primary) hypertension: Secondary | ICD-10-CM

## 2023-07-03 DIAGNOSIS — E782 Mixed hyperlipidemia: Secondary | ICD-10-CM | POA: Diagnosis not present

## 2023-07-03 DIAGNOSIS — J309 Allergic rhinitis, unspecified: Secondary | ICD-10-CM

## 2023-07-03 DIAGNOSIS — I700 Atherosclerosis of aorta: Secondary | ICD-10-CM

## 2023-07-03 DIAGNOSIS — Z0000 Encounter for general adult medical examination without abnormal findings: Secondary | ICD-10-CM

## 2023-07-03 LAB — LIPID PANEL

## 2023-07-03 MED ORDER — ATENOLOL 25 MG PO TABS: 25.0000 mg | ORAL_TABLET | Freq: Every day | ORAL | 3 refills | Status: AC

## 2023-07-04 ENCOUNTER — Encounter: Payer: Self-pay | Admitting: Family Medicine

## 2023-07-04 LAB — CBC WITH DIFFERENTIAL/PLATELET
Basophils Absolute: 0 10*3/uL (ref 0.0–0.2)
Basos: 0 %
EOS (ABSOLUTE): 0.1 10*3/uL (ref 0.0–0.4)
Eos: 1 %
Hematocrit: 47.7 % — ABNORMAL HIGH (ref 34.0–46.6)
Hemoglobin: 15.7 g/dL (ref 11.1–15.9)
Immature Grans (Abs): 0 10*3/uL (ref 0.0–0.1)
Immature Granulocytes: 0 %
Lymphocytes Absolute: 0.8 10*3/uL (ref 0.7–3.1)
Lymphs: 10 %
MCH: 29.6 pg (ref 26.6–33.0)
MCHC: 32.9 g/dL (ref 31.5–35.7)
MCV: 90 fL (ref 79–97)
Monocytes Absolute: 1 10*3/uL — ABNORMAL HIGH (ref 0.1–0.9)
Monocytes: 13 %
Neutrophils Absolute: 5.6 10*3/uL (ref 1.4–7.0)
Neutrophils: 76 %
Platelets: 239 10*3/uL (ref 150–450)
RBC: 5.3 x10E6/uL — ABNORMAL HIGH (ref 3.77–5.28)
RDW: 11.8 % (ref 11.7–15.4)
WBC: 7.4 10*3/uL (ref 3.4–10.8)

## 2023-07-04 LAB — CMP14+EGFR
ALT: 19 IU/L (ref 0–32)
AST: 31 IU/L (ref 0–40)
Albumin: 4.5 g/dL (ref 3.7–4.7)
Alkaline Phosphatase: 113 IU/L (ref 44–121)
BUN/Creatinine Ratio: 17 (ref 12–28)
BUN: 12 mg/dL (ref 8–27)
Bilirubin Total: 1.2 mg/dL (ref 0.0–1.2)
CO2: 24 mmol/L (ref 20–29)
Calcium: 9.8 mg/dL (ref 8.7–10.3)
Chloride: 95 mmol/L — ABNORMAL LOW (ref 96–106)
Creatinine, Ser: 0.71 mg/dL (ref 0.57–1.00)
Globulin, Total: 1.6 g/dL (ref 1.5–4.5)
Glucose: 90 mg/dL (ref 70–99)
Potassium: 3.6 mmol/L (ref 3.5–5.2)
Sodium: 136 mmol/L (ref 134–144)
Total Protein: 6.1 g/dL (ref 6.0–8.5)
eGFR: 82 mL/min/{1.73_m2} (ref >59)

## 2023-07-04 LAB — LIPID PANEL
Cholesterol, Total: 158 mg/dL (ref 100–199)
HDL: 73 mg/dL (ref >39)
LDL CALC COMMENT:: 2.2 ratio (ref 0.0–4.4)
LDL Chol Calc (NIH): 68 mg/dL (ref 0–99)
Triglycerides: 94 mg/dL (ref 0–149)
VLDL Cholesterol Cal: 17 mg/dL (ref 5–40)

## 2023-07-04 LAB — TSH: TSH: 1.32 u[IU]/mL (ref 0.450–4.500)

## 2023-07-04 LAB — HEMOGLOBIN A1C
Est. average glucose Bld gHb Est-mCnc: 120 mg/dL
Hgb A1c MFr Bld: 5.8 % — ABNORMAL HIGH (ref 4.8–5.6)

## 2023-07-08 NOTE — Telephone Encounter (Signed)
 Copied from CRM [id number]. Topic: Clinical - Medical Advice >> Jul 08, 2023  9:42 AM Carlatta H wrote: Reason for CRM: Please call patient on how to proceed with stool kit//She has an issue with the q tip swab not coming out//  Called patient to see if she needs a new kit, I can get her a new kit.

## 2023-07-10 DIAGNOSIS — Z0000 Encounter for general adult medical examination without abnormal findings: Secondary | ICD-10-CM | POA: Diagnosis not present

## 2023-07-18 LAB — FECAL OCCULT BLOOD, IMMUNOCHEMICAL: Fecal Occult Bld: NEGATIVE

## 2023-08-15 DIAGNOSIS — M545 Low back pain, unspecified: Secondary | ICD-10-CM | POA: Diagnosis not present

## 2023-08-15 DIAGNOSIS — R531 Weakness: Secondary | ICD-10-CM | POA: Diagnosis not present

## 2023-08-20 ENCOUNTER — Telehealth: Payer: Self-pay | Admitting: *Deleted

## 2023-08-20 DIAGNOSIS — R531 Weakness: Secondary | ICD-10-CM | POA: Diagnosis not present

## 2023-08-20 DIAGNOSIS — M545 Low back pain, unspecified: Secondary | ICD-10-CM | POA: Diagnosis not present

## 2023-08-20 NOTE — Telephone Encounter (Signed)
 Copied from CRM [id number]. Topic: Clinical - Medical Advice >> Aug 20, 2023  3:20 PM Lotus Round B wrote: Reason for CRM: pt calling in about some medical advice that she has about her age and memory and who she should see about certain things . If Dr.Knapp or nurse can give the patient a call please .  Spoke with patient and scheduled her for appt w/Dr. Monnie Anthony for memory issues.

## 2023-08-22 DIAGNOSIS — M545 Low back pain, unspecified: Secondary | ICD-10-CM | POA: Diagnosis not present

## 2023-08-22 DIAGNOSIS — R531 Weakness: Secondary | ICD-10-CM | POA: Diagnosis not present

## 2023-08-26 ENCOUNTER — Other Ambulatory Visit: Payer: Self-pay | Admitting: Family Medicine

## 2023-08-26 DIAGNOSIS — E782 Mixed hyperlipidemia: Secondary | ICD-10-CM

## 2023-08-27 NOTE — Progress Notes (Addendum)
 Chief Complaint  Patient presents with   Consult    Patient is here for memory concerns. Also having hearing issues right ear.    Patient is accompanied by her daughter [name].  She is complaining of decreased hearing in her R ear since she showered on Saturday.  Used a Q-tip to help with some of the water in her ear, and states that's when hearing got worse. She denies any pain or fever. She knows she isn't supposed to use Q-tips in her ear.  Her daughter reports more short-term memory issues.  Stories/conversations are being repeated, sometimes within the same conversation.  Asks things a few minutes later. Some confusion when talking--struggles to clarify something, gets rattled before [name] can understand what she was trying to say. Couldn't recognize or remember the name of a family member at a recent family wedding. Patient denies any significant changes.  She still pays all her bills, never loses anything or gets lost.  She reported only slight memory changes at her physical in April.  Mini-Cog was normal.  Hypothyroidism--adequately replaced per last check. Lab Results  Component Value Date   TSH 1.320 07/03/2023   CT head 12/2020: IMPRESSION: 1. No acute intracranial abnormality. 2. Small focus of chronic encephalomalacia at the anterior parasagittal left frontal lobe, which could be related to prior trauma and/or ischemia. 3. 1 cm right parafalcine meningioma without mass effect.    PMH, PSH, SH reviewed  Outpatient Encounter Medications as of 08/28/2023  Medication Sig Note   atenolol  (TENORMIN ) 25 MG tablet Take 1 tablet (25 mg total) by mouth daily.    Calcium  Carbonate (CALTRATE 600 PO) Take 1 tablet by mouth daily.    fexofenadine (ALLEGRA) 180 MG tablet Take 180 mg by mouth daily.    fluticasone  (FLONASE ) 50 MCG/ACT nasal spray SPRAY 2 SPRAYS IN EACH NOSTRIL ONCE DAILY    GLUCOSAMINE PO Take 2,000 mg by mouth 2 (two) times daily.    levothyroxine  (SYNTHROID ) 25 MCG  tablet Take 1 tablet (25 mcg total) by mouth daily.    losartan -hydrochlorothiazide  (HYZAAR) 100-25 MG tablet TAKE 1 TABLET BY MOUTH DAILY    Multiple Vitamins-Minerals (CENTRUM SILVER PO) Take 1 tablet by mouth daily.    Multiple Vitamins-Minerals (PRESERVISION/LUTEIN ) CAPS Take 1 capsule by mouth 2 (two) times daily.    Probiotic Product (ALIGN PO) Take 1 capsule by mouth daily.    simvastatin  (ZOCOR ) 20 MG tablet TAKE 1 TABLET BY MOUTH AT BEDTIME    acetaminophen  (TYLENOL ) 650 MG CR tablet Take 1,300 mg by mouth every 8 (eight) hours as needed for pain. (Patient not taking: Reported on 06/27/2022) 08/28/2023: As needed, rarely   No facility-administered encounter medications on file as of 08/28/2023.   Allergies  Allergen Reactions   Percocet [Oxycodone-Acetaminophen ] Other (See Comments)    Upset stomach    ROS: no f/c, HA, dizziness, n/v, numbness, tingling, weakness, falls. Decreased hearing R ear per HPI. No URI symptoms.     PHYSICAL EXAM:  BP 120/70   Pulse 68   Ht 5' 4 (1.626 m)   Wt 138 lb (62.6 kg)   BMI 23.69 kg/m   Wt Readings from Last 3 Encounters:  08/28/23 138 lb (62.6 kg)  07/03/23 135 lb 9.6 oz (61.5 kg)  02/14/23 137 lb 3.2 oz (62.2 kg)   Pleasant, well-appearing female in good spirits HEENT: conjunctiva and sclera are clear, EOMI.  R TM was obscured by deep cerumen.  Minimal cerumen on the left, with normal TM.  After ear lavage--good portion of cerumen moved, still with some cerumen and debris, TM not well visualized. Hearing had improved some.      08/28/2023    2:39 PM  MMSE - Mini Mental State Exam  Orientation to time 5  Orientation to Place 5  Registration 3  Attention/ Calculation 5  Recall 2  Language- name 2 objects 2  Language- repeat 1  Language- follow 3 step command 3  Language- read & follow direction 1  Write a sentence 1  Copy design 1  Total score 29   Missed 1 of 3 on recall     08/28/2023    2:34 PM  GAD 7 : Generalized  Anxiety Score  Nervous, Anxious, on Edge 1  Control/stop worrying 1  Worry too much - different things 1  Trouble relaxing 0  Restless 0  Easily annoyed or irritable 0  Afraid - awful might happen 1  Total GAD 7 Score 4  Anxiety Difficulty Not difficult at all     ASSESSMENT/PLAN:  Mild cognitive impairment - reinforced Mediterranean diet, physical and mental exercise, social interactions, hearing evaluation. Discussed concerns of worsening  Memory loss - minimal more c/w MCI than true demenia.  Husband had Lewy body dementia and declined rapidly. Family wants neuro evaluation - Plan: Vitamin B12, CT HEAD WO CONTRAST ( ), Ambulatory referral to Neurology  Meningioma [facility]) - incidental finding.  Recheck CT given concerns about worsening memory per family - Plan: CT HEAD WO CONTRAST ( ), Ambulatory referral to Neurology  Hearing loss of right ear due to cerumen impaction - some improvement after lavage, but had persistent cerumen. Will use Debrox drops and return next week for additional lavage (prior to hearing eval)  We discussed the importance of physical exercise, brain exercise, getting your hearing evlauated, continuing with social interactions. We discussed healthy diet (Mediterranean). We also discussed some red flags that you should contact us  about--leaving the stove on, getting lose, any neurological symptoms--severe headaches, vomiting, numbness, tingling, weakness.

## 2023-08-28 ENCOUNTER — Ambulatory Visit: Admitting: Family Medicine

## 2023-08-28 ENCOUNTER — Encounter: Payer: Self-pay | Admitting: Family Medicine

## 2023-08-28 VITALS — BP 120/70 | HR 68 | Ht 64.0 in | Wt 138.0 lb

## 2023-08-28 DIAGNOSIS — D329 Benign neoplasm of meninges, unspecified: Secondary | ICD-10-CM

## 2023-08-28 DIAGNOSIS — G3184 Mild cognitive impairment, so stated: Secondary | ICD-10-CM | POA: Diagnosis not present

## 2023-08-28 DIAGNOSIS — H6121 Impacted cerumen, right ear: Secondary | ICD-10-CM | POA: Diagnosis not present

## 2023-08-28 DIAGNOSIS — R413 Other amnesia: Secondary | ICD-10-CM | POA: Diagnosis not present

## 2023-08-28 DIAGNOSIS — H6122 Impacted cerumen, left ear: Secondary | ICD-10-CM | POA: Diagnosis not present

## 2023-08-28 NOTE — Patient Instructions (Signed)
 We discussed the importance of physical exercise, brain exercise, getting your hearing evlauated, continuing with social interactions. We discussed healthy diet (Mediterranean). We also discussed some red flags that you should contact us  about--leaving the stove on, getting lose, any neurological symptoms--severe headaches, vomiting, numbness, tingling, weakness.  [facility] and [facility] are a couple of options for a hearing test.

## 2023-08-29 ENCOUNTER — Ambulatory Visit: Payer: Self-pay | Admitting: Family Medicine

## 2023-08-29 ENCOUNTER — Ambulatory Visit: Admitting: Podiatry

## 2023-08-29 ENCOUNTER — Encounter: Payer: Self-pay | Admitting: Podiatry

## 2023-08-29 ENCOUNTER — Ambulatory Visit (INDEPENDENT_AMBULATORY_CARE_PROVIDER_SITE_OTHER)

## 2023-08-29 VITALS — Ht 64.0 in | Wt 138.0 lb

## 2023-08-29 DIAGNOSIS — M2042 Other hammer toe(s) (acquired), left foot: Secondary | ICD-10-CM

## 2023-08-29 DIAGNOSIS — M2041 Other hammer toe(s) (acquired), right foot: Secondary | ICD-10-CM | POA: Diagnosis not present

## 2023-08-29 DIAGNOSIS — M21611 Bunion of right foot: Secondary | ICD-10-CM

## 2023-08-29 DIAGNOSIS — M7751 Other enthesopathy of right foot: Secondary | ICD-10-CM | POA: Diagnosis not present

## 2023-08-29 DIAGNOSIS — M21612 Bunion of left foot: Secondary | ICD-10-CM

## 2023-08-29 DIAGNOSIS — L84 Corns and callosities: Secondary | ICD-10-CM | POA: Diagnosis not present

## 2023-08-29 LAB — VITAMIN B12: Vitamin B-12: 682 pg/mL (ref 232–1245)

## 2023-08-29 MED ORDER — TRIAMCINOLONE ACETONIDE 10 MG/ML IJ SUSP
10.0000 mg | Freq: Once | INTRAMUSCULAR | Status: AC
  Administered 2023-08-29: 10 mg via INTRA_ARTICULAR

## 2023-08-30 NOTE — Progress Notes (Signed)
 Subjective:   Patient ID: Courtney Castro, female   DOB: 89 y.o.   MRN: [id number]   HPI Patient presents with several different problems with 1 being significant structural deformity large bunion deformity with painful lesions underneath the first metatarsal of both feet second digits of both feet with digital deformities noted bilateral.  Also has an area of inflammation between the 3rd and 4th toe right that comes very sore and she cannot recheck or work on it herself.  Patient does not smoke likes to be active and has dealt with this for a number of years   Review of Systems  All other systems reviewed and are negative.       Objective:  Physical Exam Vitals and nursing note reviewed.  Constitutional:      Appearance: She is well-developed.  Pulmonary:     Effort: Pulmonary effort is normal.  Musculoskeletal:        General: Normal range of motion.  Skin:    General: Skin is warm.  Neurological:     Mental Status: She is alert.     Neurovascular status found to be intact muscle strength was found to be adequate range of motion adequate.  Patient is noted to have significant structural deformity of both feet with bunion deformity digital deformity that are overlapping the lesser digits history of surgery on the right that was not successful with chronic fluid buildup lesion on the third digit right foot very painful with chronic lesions subfirst metatarsal bilateral second digit bilateral.  Good digital perfusion well-oriented     Assessment:  Difficult issues here with structural deformity and significant keratotic lesion formation fluid buildup third digit right foot pain     Plan:  H&P all conditions reviewed do not recommend surgery currently reviewed x-rays went ahead today did sterile prep injected the inner phalangeal joint digit three 2 mg dexamethasone Kenalog 5 mg Xylocaine  and did sharp debridement of lesion and then did debridement of all other lesions with no  iatrogenic bleeding noted.  This can be done routinely cushioning was dispensed all questions answered concerning digital deformity structural bunion deformity  X-rays indicate significant elevation of the intermetatarsal angle bilateral with toes that are out of position right over left foot at the MPJ both sagittal and transverse

## 2023-09-02 ENCOUNTER — Encounter: Payer: Self-pay | Admitting: Physician Assistant

## 2023-09-03 ENCOUNTER — Other Ambulatory Visit: Payer: Self-pay | Admitting: Family Medicine

## 2023-09-03 NOTE — Progress Notes (Unsigned)
 No chief complaint on file.  Patient presents for further wok on residual cerumen in R ear. Seen 6/4 along with her daughter for cognitive concerns, and had noted acute onset of decreased hearing in the R ear (after using a Qtip). Hearing had improved somewhat, but there was still residual cerumen and debris. She has been using ***     PMH, PSH, SH reviewed   ROS:    PHYSICAL EXAM:  There were no vitals taken for this visit.      ASSESSMENT/PLAN:

## 2023-09-04 ENCOUNTER — Ambulatory Visit (INDEPENDENT_AMBULATORY_CARE_PROVIDER_SITE_OTHER): Admitting: Family Medicine

## 2023-09-04 ENCOUNTER — Encounter: Payer: Self-pay | Admitting: Family Medicine

## 2023-09-04 VITALS — BP 112/70 | HR 64 | Ht 64.0 in | Wt 138.2 lb

## 2023-09-04 DIAGNOSIS — H6121 Impacted cerumen, right ear: Secondary | ICD-10-CM

## 2023-09-04 NOTE — Patient Instructions (Signed)
 We removed a lot more wax today, but couldn't get it all. There is still a lot left in the ear. We are referring you to ENT, so that they can further clean out the ear (they have suction and other tools, rather than just the flushing we do in the office).   If they can't get you in within 3-4 weeks, you can come here for us  to try again.  Continue to use the Debrox drops to help with the wax.  If we are able to get it completely clear, you can cancel the ENT appointment, but definitely get one scheduled.

## 2023-09-06 ENCOUNTER — Ambulatory Visit
Admission: RE | Admit: 2023-09-06 | Discharge: 2023-09-06 | Disposition: A | Discharge status: Home / Self Care | Source: Ambulatory Visit | Attending: Family Medicine | Admitting: Family Medicine

## 2023-09-06 DIAGNOSIS — D320 Benign neoplasm of cerebral meninges: Secondary | ICD-10-CM | POA: Diagnosis not present

## 2023-09-06 DIAGNOSIS — D329 Benign neoplasm of meninges, unspecified: Secondary | ICD-10-CM

## 2023-09-06 DIAGNOSIS — R413 Other amnesia: Secondary | ICD-10-CM | POA: Diagnosis not present

## 2023-09-06 DIAGNOSIS — G9389 Other specified disorders of brain: Secondary | ICD-10-CM | POA: Diagnosis not present

## 2023-09-09 DIAGNOSIS — M545 Low back pain, unspecified: Secondary | ICD-10-CM | POA: Diagnosis not present

## 2023-09-09 DIAGNOSIS — R531 Weakness: Secondary | ICD-10-CM | POA: Diagnosis not present

## 2023-09-17 ENCOUNTER — Other Ambulatory Visit: Payer: Self-pay | Admitting: Family Medicine

## 2023-09-17 DIAGNOSIS — I10 Essential (primary) hypertension: Secondary | ICD-10-CM

## 2023-09-18 DIAGNOSIS — H6123 Impacted cerumen, bilateral: Secondary | ICD-10-CM | POA: Diagnosis not present

## 2023-09-24 ENCOUNTER — Ambulatory Visit: Admitting: Physician Assistant

## 2023-09-24 ENCOUNTER — Ambulatory Visit

## 2023-09-24 ENCOUNTER — Encounter: Payer: Self-pay | Admitting: Physician Assistant

## 2023-09-24 VITALS — BP 145/75 | HR 82 | Resp 20 | Ht 64.0 in | Wt 138.0 lb

## 2023-09-24 DIAGNOSIS — R413 Other amnesia: Secondary | ICD-10-CM | POA: Diagnosis not present

## 2023-09-24 NOTE — Patient Instructions (Addendum)
 It was a pleasure to see you today at our office.   Recommendations:  Neurocognitive evaluation at our office   MRI of the brain, the radiology office will call you to arrange you appointment  [phone] Follow up after neuropsych evaluation      [email or web address]   RECOMMENDATIONS FOR ALL PATIENTS WITH MEMORY PROBLEMS: 1. Continue to exercise (Recommend 30 minutes of walking everyday, or 3 hours every week) 2. Increase social interactions - continue going to [place] and enjoy social gatherings with friends and family 3. Eat healthy, avoid fried foods and eat more fruits and vegetables 4. Maintain adequate blood pressure, blood sugar, and blood cholesterol level. Reducing the risk of stroke and cardiovascular disease also helps promoting better memory. 5. Avoid stressful situations. Live a simple life and avoid aggravations. Organize your time and prepare for the next day in anticipation. 6. Sleep well, avoid any interruptions of sleep and avoid any distractions in the bedroom that may interfere with adequate sleep quality 7. Avoid sugar, avoid sweets as there is a strong link between excessive sugar intake, diabetes, and cognitive impairment We discussed the Mediterranean diet, which has been shown to help patients reduce the risk of progressive memory disorders and reduces cardiovascular risk. This includes eating fish, eat fruits and green leafy vegetables, nuts like almonds and hazelnuts, walnuts, and also use olive oil. Avoid fast foods and fried foods as much as possible. Avoid sweets and sugar as sugar use has been linked to worsening of memory function.  There is always a concern of gradual progression of memory problems. If this is the case, then we may need to adjust level of care according to patient needs. Support, both to the patient and caregiver, should then be put into place.      You have been referred for a  neuropsychological evaluation (i.e., evaluation of memory and thinking abilities). Please bring someone with you to this appointment if possible, as it is helpful for the doctor to hear from both you and another adult who knows you well. Please bring eyeglasses and hearing aids if you wear them.    The evaluation will take approximately 3 hours and has two parts:   The first part is a clinical interview with the neuropsychologist (Dr. Richie or Dr. Gayland). During the interview, the neuropsychologist will speak with you and the individual you brought to the appointment.    The second part of the evaluation is testing with the doctor's technician Neal or Luke). During the testing, the technician will ask you to remember different types of material, solve problems, and answer some questionnaires. Your family member will not be present for this portion of the evaluation.   Please note: We must reserve several hours of the neuropsychologist's time and the psychometrician's time for your evaluation appointment. As such, there is a No-Show fee of $100. If you are unable to attend any of your appointments, please contact our office as soon as possible to reschedule.      DRIVING: Regarding driving, in patients with progressive memory problems, driving will be impaired. We advise to have someone else do the driving if trouble finding directions or if minor accidents are reported. Independent driving assessment is available to determine safety of driving.   If you are interested in the driving assessment, you can contact the following:  [facility] in [place] [phone]  [facility] [phone]  [facility] [phone]  [facility] [phone] or [phone]   FALL PRECAUTIONS:  Be cautious when walking. Scan the area for obstacles that may increase the risk of trips and falls. When getting up in the mornings, sit up at the edge of the bed for a  few minutes before getting out of bed. Consider elevating the bed at the head end to avoid drop of blood pressure when getting up. Walk always in a well-lit room (use night lights in the walls). Avoid area rugs or power cords from appliances in the middle of the walkways. Use a walker or a cane if necessary and consider physical therapy for balance exercise. Get your eyesight checked regularly.  FINANCIAL OVERSIGHT: Supervision, especially oversight when making financial decisions or transactions is also recommended.  HOME SAFETY: Consider the safety of the kitchen when operating appliances like stoves, microwave oven, and blender. Consider having supervision and share cooking responsibilities until no longer able to participate in those. Accidents with firearms and other hazards in the house should be identified and addressed as well.   ABILITY TO BE LEFT ALONE: If patient is unable to contact 911 operator, consider using LifeLine, or when the need is there, arrange for someone to stay with patients. Smoking is a fire hazard, consider supervision or cessation. Risk of wandering should be assessed by caregiver and if detected at any point, supervision and safe proof recommendations should be instituted.  MEDICATION SUPERVISION: Inability to self-administer medication needs to be constantly addressed. Implement a mechanism to ensure safe administration of the medications.      Mediterranean Diet A Mediterranean diet refers to food and lifestyle choices that are based on the traditions of countries located on the [employer] [place]. This way of eating has been shown to help prevent certain conditions and improve outcomes for people who have chronic diseases, like kidney disease and heart disease. What are tips for following this plan? Lifestyle  Cook and eat meals together with your family, when possible. Drink enough fluid to keep your urine clear or pale yellow. Be physically active every day.  This includes: Aerobic exercise like running or swimming. Leisure activities like gardening, walking, or housework. Get 7-8 hours of sleep each night. If recommended by your health care provider, drink red wine in moderation. This means 1 glass a day for nonpregnant women and 2 glasses a day for men. A glass of wine equals 5 oz (150 mL). Reading food labels  Check the serving size of packaged foods. For foods such as rice and pasta, the serving size refers to the amount of cooked product, not dry. Check the total fat in packaged foods. Avoid foods that have saturated fat or trans fats. Check the ingredients list for added sugars, such as corn syrup. Shopping  At the grocery store, buy most of your food from the areas near the walls of the store. This includes: Fresh fruits and vegetables (produce). Grains, beans, nuts, and seeds. Some of these may be available in unpackaged forms or large amounts (in bulk). Fresh seafood. Poultry and eggs. Low-fat dairy products. Buy whole ingredients instead of prepackaged foods. Buy fresh fruits and vegetables in-season from local farmers markets. Buy frozen fruits and vegetables in resealable bags. If you do not have access to quality fresh seafood, buy precooked frozen shrimp or canned fish, such as tuna, salmon, or sardines. Buy small amounts of raw or cooked vegetables, salads, or olives from the deli or salad bar at your store. Stock your pantry so you always have certain foods on hand, such as olive oil, canned tuna,  canned tomatoes, rice, pasta, and beans. Cooking  Cook foods with extra-virgin olive oil instead of using butter or other vegetable oils. Have meat as a side dish, and have vegetables or grains as your main dish. This means having meat in small portions or adding small amounts of meat to foods like pasta or stew. Use beans or vegetables instead of meat in common dishes like chili or lasagna. Experiment with different cooking methods.  Try roasting or broiling vegetables instead of steaming or sauteing them. Add frozen vegetables to soups, stews, pasta, or rice. Add nuts or seeds for added healthy fat at each meal. You can add these to yogurt, salads, or vegetable dishes. Marinate fish or vegetables using olive oil, lemon juice, garlic, and fresh herbs. Meal planning  Plan to eat 1 vegetarian meal one day each week. Try to work up to 2 vegetarian meals, if possible. Eat seafood 2 or more times a week. Have healthy snacks readily available, such as: Vegetable sticks with hummus. Greek yogurt. Fruit and nut trail mix. Eat balanced meals throughout the week. This includes: Fruit: 2-3 servings a day Vegetables: 4-5 servings a day Low-fat dairy: 2 servings a day Fish, poultry, or lean meat: 1 serving a day Beans and legumes: 2 or more servings a week Nuts and seeds: 1-2 servings a day Whole grains: 6-8 servings a day Extra-virgin olive oil: 3-4 servings a day Limit red meat and sweets to only a few servings a month What are my food choices? Mediterranean diet Recommended Grains: Whole-grain pasta. Brown rice. Bulgar wheat. Polenta. Couscous. Whole-wheat bread. [place] Made[place]. Vegetables: Artichokes. Beets. Broccoli. Cabbage. Carrots. Eggplant. Green beans. Chard. Kale. Spinach. Onions. Leeks. Peas. Squash. Tomatoes. Peppers. Radishes. Fruits: Apples. Apricots. Avocado. Berries. Bananas. Cherries. Dates. Figs. Grapes. Lemons. Melon. Oranges. Peaches. Plums. Pomegranate. Meats and other protein foods: Beans. Almonds. Sunflower seeds. Pine nuts. Peanuts. Cod. Salmon. Scallops. Shrimp. Tuna. Tilapia. Clams. Oysters. Eggs. Dairy: Low-fat milk. Cheese. Greek yogurt. Beverages: Water. Red wine. Herbal tea. Fats and oils: Extra virgin olive oil. Avocado oil. Grape seed oil. Sweets and desserts: [place] yogurt with honey. Baked apples. Poached pears. Trail mix. Seasoning and other foods: Basil. Cilantro. Coriander. Cumin. Mint.  Parsley. Sage. Rosemary. Tarragon. Garlic. Oregano. Thyme. Pepper. Balsalmic vinegar. Tahini. Hummus. Tomato sauce. Olives. Mushrooms. Limit these Grains: Prepackaged pasta or rice dishes. Prepackaged cereal with added sugar. Vegetables: Deep fried potatoes (french fries). Fruits: Fruit canned in syrup. Meats and other protein foods: Beef. Pork. Lamb. Poultry with skin. Hot dogs. [place]. Dairy: Ice cream. Sour cream. Whole milk. Beverages: Juice. Sugar-sweetened soft drinks. Beer. Liquor and spirits. Fats and oils: Butter. Canola oil. Vegetable oil. Beef fat (tallow). Lard. Sweets and desserts: Cookies. Cakes. Pies. Candy. Seasoning and other foods: Mayonnaise. Premade sauces and marinades. The items listed may not be a complete list. Talk with your dietitian about what dietary choices are right for you. Summary The Mediterranean diet includes both food and lifestyle choices. Eat a variety of fresh fruits and vegetables, beans, nuts, seeds, and whole grains. Limit the amount of red meat and sweets that you eat. Talk with your health care provider about whether it is safe for you to drink red wine in moderation. This means 1 glass a day for nonpregnant women and 2 glasses a day for men. A glass of wine equals 5 oz (150 mL). This information is not intended to replace advice given to you by your health care provider. Make sure you discuss any questions you have with  your health care provider. Document Released: 11/03/2015 Document Revised: 12/06/2015 Document Reviewed: 11/03/2015 Elsevier Interactive Patient Education  2017 [employer].

## 2023-09-24 NOTE — Progress Notes (Signed)
 Assessment/Plan:     Courtney Castro is a very pleasant 89 y.o. year old RH female with a history of hypertension, hyperlipidemia, hypothyroidism, known subcentimeter right parafalcine meningioma without mass effect per CT of the head in without growth since 2022, seen today for evaluation of memory loss. MoCA today is 27/30 .  Workup is in progress.  Etiology is unclear, although it may be a component of anxiety.  She is able to participate on her ADLs and to drive without difficulties.  Mood is anxious but she attends psychotherapy sessions.   Memory Impairment of unclear etiology   MRI brain without contrast to assess for underlying structural abnormality and assess vascular load  Neurocognitive testing to further evaluate cognitive concerns and determine other underlying cause of memory changes, including potential contribution from sleep, anxiety, attention, or depression among others  Recommend good control of cardiovascular risk factors.   Continue to control mood medications as per PCP, continue psychotherapy Recommend checking hearing in an effort to improve comprehension Folllow up after neuropsych evaluation and the above results, will consider dementia medication if indicated  Subjective:    The patient is accompanied by her daughter who supplements  the history.    How long did patient have memory difficulties?  For about one year, per daughter's report. Patient reports some difficulty remembering new information, recent conversations, names. LTM may be slightly affected as well, for example during a wedding she could not recognize a family member.  repeats oneself?  Endorsed, at times within the same conversation. A good amount of looping-daughter says.  Disoriented when walking into a room? Denies    Leaving objects in unusual places?  Denies.   Wandering behavior? Denies.   Any personality changes, or depression, anxiety? I am sure I am anxious about things,  especially appointments, the weather . She sees a therapist Rhoda, every 2 weeks Hallucinations or paranoia? Denies.   Seizures? Denies.    Any sleep changes?   Does not sleep well because I have to get up to go to the bathroom every 2 hours. Denies  frequent nightmares or dream reenactment, other REM behavior or sleepwalking   Sleep apnea? Denies.   Any hygiene concerns?  Denies.   Independent of bathing and dressing? Endorsed  Does the patient need help with medications?  Patient is in charge   Who is in charge of the finances?  Patient is in charge, denies missing any bills     Any changes in appetite?   Denies.     Patient have trouble swallowing?  Denies.   Does the patient cook? Some, denies forgetting common recipes or kitchen accidents   Any headaches?  Denies.   Chronic pain? Denies.   Ambulates with difficulty? Denies. Does balance group exercise. Does not walk much. Recent falls or head injuries? Denies.     Vision changes?  Denies any new issues.  Had cataracts removed  Hearing issues?  Wax was impacted on my R ear, it was removed . Any strokelike symptoms? Denies.   Any tremors? Denies.   Any anosmia? Denies.   Any incontinence of urine? At night time.  Any bowel dysfunction? Denies. Years ago I had IBS, but controlled with align and no recurrence.   Patient lives alone. Her husband died with Lewy body disease   History of heavy alcohol intake? Denies.   History of heavy tobacco use? Denies.   Family history of dementia?  Denies.  Does patient drive? yes, denies  getting lost.   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  Recent labs 2025 TSH 1.320, B12 682, A1c 5.8  Allergies  Allergen Reactions   Percocet [Oxycodone-Acetaminophen ] Other (See Comments)    Upset stomach    Current Outpatient Medications  Medication Instructions   acetaminophen  (TYLENOL ) 1,300 mg, Every 8 hours PRN   atenolol  (TENORMIN ) 25 mg, Oral, Daily   Calcium  Carbonate  (CALTRATE 600 PO) 1 tablet, Daily   fexofenadine (ALLEGRA) 180 mg, Daily   fluticasone  (FLONASE ) 50 MCG/ACT nasal spray SPRAY 2 SPRAYS IN EACH NOSTRIL ONCE DAILY   GLUCOSAMINE PO 2,000 mg, 2 times daily   levothyroxine  (SYNTHROID ) 25 mcg, Oral, Daily   losartan -hydrochlorothiazide  (HYZAAR) 100-25 MG tablet 1 tablet, Oral, Daily   Multiple Vitamins-Minerals (CENTRUM SILVER PO) 1 tablet, Daily   Multiple Vitamins-Minerals (PRESERVISION/LUTEIN ) CAPS 1 capsule, 2 times daily   Probiotic Product (ALIGN PO) 1 capsule, Daily   simvastatin  (ZOCOR ) 20 mg, Oral, Daily at bedtime     VITALS:   Vitals:   09/24/23 0946  BP: (!) 145/75  Pulse: 82  Resp: 20  SpO2: 98%  Weight: 138 lb (62.6 kg)  Height: 5' 4 (1.626 m)     Physical Exam  :    09/24/2023   11:00 AM  Montreal Cognitive Assessment   Visuospatial/ Executive (0/5) 5  Naming (0/3) 3  Attention: Read list of digits (0/2) 2  Attention: Read list of letters (0/1) 1  Attention: Serial 7 subtraction starting at 100 (0/3) 3  Language: Repeat phrase (0/2) 2  Language : Fluency (0/1) 1  Abstraction (0/2) 2  Delayed Recall (0/5) 2  Orientation (0/6) 6  Total 27  Adjusted Score (based on education) 27       08/28/2023    2:39 PM  MMSE - Mini Mental State Exam  Orientation to time 5  Orientation to Place 5  Registration 3  Attention/ Calculation 5  Recall 2  Language- name 2 objects 2  Language- repeat 1  Language- follow 3 step command 3  Language- read & follow direction 1  Write a sentence 1  Copy design 1  Total score 29       HEENT:  Normocephalic, atraumatic.  The superficial temporal arteries are without ropiness or tenderness. Cardiovascular: Regular rate and rhythm. Lungs: Clear to auscultation bilaterally. Neck: There are no carotid bruits noted bilaterally. Orientation:  Alert and oriented to person, place and time . No aphasia or dysarthria. Fund of knowledge is appropriate. Recent memory impaired, remote  memory is normal.  Attention and concentration are normal.  Able to name objects and repeat phrases.  Delayed recall 2/5  cranial nerves: There is good facial symmetry.  Mildly anxious appearing.  Extraocular muscles are intact and visual fields are full to confrontational testing. Speech is fluent and clear. No tongue deviation. Hearing is intact to conversational tone.  Tone: Tone is good throughout. Sensation: Sensation is intact to light touch.  Vibration is intact at the bilateral big toe.  Coordination: The patient has no difficulty with RAM's or FNF bilaterally. Normal finger to nose  Motor: Strength is 5/5 in the bilateral upper and lower extremities. There is no pronator drift. There are no fasciculations noted. DTR's: Deep tendon reflexes are 2/4 bilaterally. Gait and Station: The patient is able to ambulate without difficulty. Gait is cautious and narrow. Stride length is normal.        Thank you for allowing us  the opportunity to participate in  the care of this nice patient. Please do not hesitate to contact us  for any questions or concerns.   Total time spent on today's visit was 47 minutes dedicated to this patient today, preparing to see patient, examining the patient, ordering tests and/or medications and counseling the patient, documenting clinical information in the EHR or other health record, independently interpreting results and communicating results to the patient/family, discussing treatment and goals, answering patient's questions and coordinating care.  Cc:  Randol Dawes, MD  [name] 09/24/2023 11:10 AM

## 2023-09-30 DIAGNOSIS — R531 Weakness: Secondary | ICD-10-CM | POA: Diagnosis not present

## 2023-09-30 DIAGNOSIS — M545 Low back pain, unspecified: Secondary | ICD-10-CM | POA: Diagnosis not present

## 2023-10-03 DIAGNOSIS — H35363 Drusen (degenerative) of macula, bilateral: Secondary | ICD-10-CM | POA: Diagnosis not present

## 2023-10-03 DIAGNOSIS — H353221 Exudative age-related macular degeneration, left eye, with active choroidal neovascularization: Secondary | ICD-10-CM | POA: Diagnosis not present

## 2023-10-03 DIAGNOSIS — D3132 Benign neoplasm of left choroid: Secondary | ICD-10-CM | POA: Diagnosis not present

## 2023-10-03 DIAGNOSIS — Z961 Presence of intraocular lens: Secondary | ICD-10-CM | POA: Diagnosis not present

## 2023-10-03 DIAGNOSIS — H353112 Nonexudative age-related macular degeneration, right eye, intermediate dry stage: Secondary | ICD-10-CM | POA: Diagnosis not present

## 2023-10-03 DIAGNOSIS — H26493 Other secondary cataract, bilateral: Secondary | ICD-10-CM | POA: Diagnosis not present

## 2023-10-14 ENCOUNTER — Ambulatory Visit: Admitting: Psychology

## 2023-10-14 ENCOUNTER — Ambulatory Visit: Payer: Self-pay | Admitting: Psychology

## 2023-10-14 DIAGNOSIS — R4189 Other symptoms and signs involving cognitive functions and awareness: Secondary | ICD-10-CM

## 2023-10-14 NOTE — Progress Notes (Unsigned)
 NEUROPSYCHOLOGICAL EVALUATION [place]  [place] Department of Neurology  Date of Evaluation: 10/14/2023  REASON FOR REFERRAL   Courtney Castro is an 89 year old, right-handed, White female with 16 years of formal education. She was referred for neuropsychological evaluation by Courtney Sevin, PA-C, to assess current neurocognitive functioning, document potential cognitive deficits, and assist with treatment planning. This is her first neuropsychological evaluation.  SUMMARY OF RESULTS   Premorbid cognitive abilities are estimated to be in the above average range based on word reading and sociodemographic factors. Relative to this baseline estimate, performance today was broadly within age-related expectations, with a few exceptions.  Specifically, performance on the visual confrontation naming task was below expectations, as evidenced by difficulty correctly naming seven pictured items. Phonemic cueing provided minimal benefit, resulting in only two additional correct responses. She also exhibited some difficulty with the immediate recall of less structured information (i.e., word list and shapes); however, her delayed recall and recognition of the information were intact.  All other measures--including attention/working memory, processing speed, executive functioning, verbal fluency, and visuospatial abilities--were within normal limits.  On self-report questionnaires, she indicated mild symptoms of depression but minimal symptoms of anxiety.  DIAGNOSTIC IMPRESSION   Results of the current evaluation indicated broadly normal test performance, with the exception of a few isolated weaknesses that did not appear to reflect broader impairments within their respective domains. Functional independence is maintained. There is no evidence of a neurocognitive disorder at this time. Etiology of the confrontation naming difficulty is somewhat unclear at this time, especially  given that other language abilities are well-preserved; we can monitor it over time to determine whether it represents an emerging pattern. An MRI of the brain was recently conducted, so if the difficulty is related to any structural change, this may become clearer once the imaging is interpreted. Otherwise, subjective cognitive concerns are most likely related to normal aging, anxiety, and cerebrovascular changes. Results serve as a baseline for future comparison, should it ever become necessary to re-evaluate the patient.   ICD-10 Codes: R41.89 Cognitive changes  RECOMMENDATIONS   In consultation with your doctor, schedule cognitive reevaluation on an as-needed basis to assess for cognitive decline and update treatment recommendations. Reevaluation should occur during a period of medical and affective stability.  Continue treatment for anxiety, especially given that emotional distress can exacerbate cognitive difficulties. If symptoms become unmanageable or begin to interfere with daily functioning, you may wish to consider other available treatment options, such as medication.  Prioritize managing vascular risk factors through a heart healthy diet (e.g., MIND, Mediterranean), physician-approved physical activity, and medication adherence.   Continue to participate in activities that you find enjoyable and fulfilling, whether that be hobbies, socializing with loved ones, or being outdoors. This can improve mood, increase motivation, and offer cognitive stimulation.  Consider implementing compensatory strategies to maximize independence and maintain daily functioning. Examples include:   -Adhere to routine. Compensatory strategies work best when they are used consistently. Use a planner, calendar, or white board that has the schedule and important events for the day clearly listed to reference and cross off when tasks are complete.   -Ask for written information, especially if it is new or  unfamiliar (e.g., information provided at a doctor's appointment).   -Create an organized environment. Keep items that can be easily misplaced in a sensible location and get into the habit of always returning the items to those places.   -Pay attention and reduce distractions. Make a point of  focusing attention on information you want to remember. One-on-one interaction is more likely to facilitate attention and minimize distraction. Make eye contact and repeat the information out loud after you hear it. Reduce interruptions or distractions especially when attempting to learn new information.   -Create associations. When learning something new, think about and understand the information. Explain it in your own words or try to associate it with something you already know. Take notes to help remember important details.  -Evaluate goals and plan accordingly. When confronted by many different tasks, begin by making a list that prioritizes each task and estimates the time it will take to complete. Break down complicated tasks into smaller, more manageable steps.   -Focus on one task at a time and complete each task before starting another. Avoid multitasking.  DISPOSITION   Patient will follow up with the referring provider, Ms. Castro. No follow-up neuropsychological testing was scheduled at this time. Please feel free to refer the patient for repeated evaluation if she shows a significant change in neurocognitive status. She and her daughter will be provided verbal feedback in approximately one week regarding the findings and impression during this visit.  The remainder of the report includes the details of the patient's background and a table of results from the current evaluation, which support the summary and recommendations described above.  BACKGROUND   History of Presenting Illness: The following information was obtained from a review of medical records and an interview with the patient and  her daughter, Courtney. Patient established care at with Courtney Sevin, PA-C, at [facility] on 09/24/2023 due to memory difficulties over the last year. MoCA = 27/30. She was referred for neuropsychological evaluation accordingly.  Cognitive Functioning: During today's appointment, the patient denied experiencing any significant cognitive changes. She reported occasional word-finding difficulties and rare instances of misplacing items but otherwise denied problems with short-term memory, attention, processing speed, comprehension, navigation, or executive functioning (e.g., planning and organizing). In comparison, her daughter has observed some cognitive changes over the past year, which have become more pronounced in the last six months. Specifically, the daughter identified short-term memory as the primary area of concern, noting that the patient may "loop" or repeat herself. Additionally, on approximately half a dozen occasions in recent months, the daughter reported that the patient began discussing something she did not understand; when she asked questions for clarification, the patient became confused despite being the one to initiate the conversation. Otherwise, the daughter has not observed the patient forgetting details of conversations or events, nor does she express major concerns in other cognitive domains.  Physical Functioning: Patient denied difficulties with both sleep initiation and maintenance. Appetite is stable. No changes to sense of taste or smell were reported. Vision and hearing are stable. She denied tremors. She endorsed some difficulty with balance but denied falls. She has been training at [facility] about twice per week to address this; she noted that she initially began physical therapy at this facility several years ago following deconditioning after hospitalization in October 2022 for pneumonia complicated by acute hypoxemic respiratory failure and severe sepsis.    Emotional Functioning: Patient described her recent mood as "even" but acknowledged a tendency to "over worry" about various matters. 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 She remains fairly active, regularly socializing with friends for lunch, spending time with her daughter, participating in a women's church group, completing crossword puzzles and word searches, and managing household chores.  Imaging: MRI of the brain is scheduled for 10/14/2023. CT of the head (09/06/2023) documented focal encephalomalacia in the anterior left frontal lobe as well as a mass along the right aspect of the anterior falx suggestive of a parafalcine meningioma.  Other Relevant Medical History: Remarkable for hypertension, hyperlipidemia, aortic atherosclerosis, macular degeneration of left eye, and hypothyroidism. Please refer to the medical record for a more comprehensive problem list. Of note, the patient reported a fall around December 2015, during which she was found to have a small subarachnoid hemorrhage that subsequently resolved without intervention. No history of stroke, CNS infection, or seizure was reported.  Current Medications: Per record, acetaminophen , atenolol , calcium  carbonate, fexofenadine, fluticasone , glucosamine, levothyroxine , losartan -hydrochlorothiazide , multiple vitamins-minerals, probiotic, and simvastatin .   Functional Status: Patient independently performs all basic and instrumental (e.g. driving, finances, medications, household chores) activities of daily living without difficulty.  Family Neurological History: Unremarkable to the patient's knowledge.  Psychiatric History: Remarkable for anxiety, currently managed with counseling every two weeks. History of depression, prior mental health treatment, suicidal ideation, hallucinations, and psychiatric hospitalizations was not reported.  Substance Use History: Patient  reported infrequent alcohol consumption. Current use of nicotine, marijuana, and other illicit substances was denied.  Social and Developmental History: Patient was born and raised in [place] . History of perinatal complications and developmental delays was not reported. She is widowed and lives alone. She has two daughters.   Educational and Occupational History: No history of childhood learning disability, special education services, or grade retention was reported. Patient earned a bachelor's degree, although she explained that it was not in a specific field of interest, as the educational system at the time differed from current standards. 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  BEHAVIORAL OBSERVATIONS   Patient arrived on time and was accompanied by her daughter, Courtney. She ambulated independently and without gait disturbance. She was alert and fully oriented. She was appropriately groomed and dressed for the setting. No significant sensory or motor abnormalities were observed. Vision (glasses) and hearing were adequate for testing purposes. Speech was of normal rate, prosody, and volume. No conversational word-finding difficulties, paraphasic errors, or dysarthria were observed. Comprehension was conversationally intact. Thought processes were linear, logical, and coherent. Thought content was organized and devoid of delusions. Insight appeared appropriate. Affect was even and congruent with euthymic mood. She was cooperative and gave adequate effort during testing, including on standalone and embedded measures of performance validity. Results are thought to accurately reflect her cognitive functioning at this time.  NEUROPSYCHOLOGICAL TESTING RESULTS   Tests Administered: Animal Naming Test; Brief Visuospatial Memory Test-Revised (BVMT-R) - Form 1; Controlled Oral Word Association Test (COWAT): FAS; Delis-Kaplan Executive Function System (D-KEFS) -  Subtest(s): Color-Word Interference Test; Geriatric Anxiety Scale-10 Item (GAS-10); Geriatric Depression Scale Short Form (GDS-SF); Hopkins Verbal Learning Test Revised (HVLT-R) - From 1; Neuropsychological Assessment Battery (NAB) - Subtest(s): Naming Form 1; Repeatable Battery for the Assessment of Neuropsychological Status Update (RBANS Update) Form A - Subtest(s): Line Orientation; Standalone performance validity test (PVT); Test of Premorbid Functioning (TOPF); Trail Making Test (TMT); Wechsler Adult Intelligence Scale Fifth Edition (WAIS-5) - Subtest(s): Similarities, Clinical cytogeneticist, Matrix Reasoning, Digit Sequencing, Coding, Running Digits, Symbol Search, Symbol Span; and Wechsler Memory Scale Fourth Edition (WMS-IV) - Subtest(s): Logical Memory (LM).  Test results are provided in the table below. Whenever possible, the patient's scores were compared against age-, sex-, and education-corrected normative samples. Interpretive descriptions are based on the AACN consensus  conference statement on uniform labeling (Guilmette et al., 2020).  PREMORBID FUNCTIONING RAW  RANGE  TOPF 62 StdS=121 Above Average  ATTENTION & WORKING MEMORY RAW  RANGE  WAIS-5 Digit Sequencing -- ss=10 Average  WAIS-5 Running Digits -- ss=9 Average  WAIS-5 Symbol Span -- ss=7 Low Average  PROCESSING SPEED RAW  RANGE  Trails A 49''0e T=42 Low Average  WAIS-5 Coding  -- ss=10 Average  WAIS-5 Symbol Search -- ss=8 Average  DKEFS CWIT Color Naming 31''3e ss=12 High Average  DKEFS CWIT Word Reading 19''0e ss=14 High Average  EXECUTIVE FUNCTION RAW  RANGE  Trails B 94''1e T=51 Average  WAIS-5 Matrix Reasoning -- ss=10 Average  WAIS-5 Similarities -- ss=14 High Average  COWAT Letter Fluency 15+18+13 T=55 Average  DKEFS CWIT Inhibition 89''8e ss=10 Average  DKEFS CWIT Inhibition/Switching 63''4e ss=14 High Average  LANGUAGE RAW  RANGE  COWAT Letter Fluency 15+18+13 T=55 Average  Animal Naming Test 15 T=44 Average  NAB  Naming Test 24/31 T=30 BNL  VISUOSPATIAL RAW  RANGE  RBANS Line Orientation -- 26-50%ile Average  WAIS-5 Block Design -- ss=15 Above Average  BVMT-R Copy Trial 12/12 -- WNL  VERBAL LEARNING & MEMORY RAW  RANGE  HVLT Learning Trials (4+4+5)/36 T=31 Below Average  HVLT Delayed Recall 5/12 T=40 Low Average  HVLT Recognition Hits 12 -- --  HVLT Recognition False Positives 1 -- --  HVLT Discrimination Index 11 T=54 Average  WMS-IV LM-I  (6+11+2)/53 ss=8 Average  WMS-IV LM-II  (7+2)/39 ss=9 Average  WMS-IV LM Recognition  (8+8)/23 26-50%ile Average  VISUAL LEARNING & MEMORY RAW  RANGE  BVMT-R Trial 1 0/12 T=29 Exceptionally Low  BVMT-R Trial 2 2/12 T=39 Low Average  BVMT-R Trial 3 5/12 T=47 Average  BVMT-R Total Recall 7/36 T=38 Low Average  BVMT-R Delayed Recall 2/12 T=39 Low Average  BVMT-R Recognition Hits 4 -- --  BVMT-R Recognition False Alarms 1 -- --  BVMT-R Recognition Discrimination Index 3 T=38 Low Average  *From Powell et al. (2022) -- -- --  QUESTIONNAIRES RAW  RANGE  GDS-SF 5 -- Mild  GAS-10 4 -- Minimal  *Note: ss = scaled score; StdS = standard score; T = t-score; C/S = corrected raw score; WNL = within normal limits; BNL= below normal limits; D/C = discontinued. Scores from skewed distributions are typically interpreted as WNL (>=16th %ile) or BNL (<16th %ile).   INFORMED CONSENT   Patient was provided with a verbal description of the nature and purpose of the neuropsychological evaluation. Also reviewed were the foreseeable risks and/or discomforts and benefits of the procedure, limits of confidentiality, and mandatory reporting requirements of this provider. Patient was given the opportunity to have their questions answered. Oral consent to participate was provided by the patient.   This report was prepared as part of a clinical evaluation and is not intended for forensic use.  SERVICE   This evaluation was conducted by Renda Beckwith, Psy.D. In addition to time spent  directly with the patient, total professional time (120 minutes) includes record review, integration of relevant medical history, test selection, interpretation of findings, and report preparation. A technician, Lonell Jude, B.S., provided testing and scoring assistance for 120 minutes.  Psychiatric Diagnostic Evaluation Services (Professional): 09208 x 1 Neuropsychological Testing Evaluation Services (Professional): 03867 x 1 Neuropsychological Testing Evaluation Services (Professional): 03866 x 1 Neuropsychological Test Administration and Scoring Radiographer, therapeutic): [phone] x 1 Neuropsychological Test Administration and Scoring (Technician): [phone] x 3  This report was generated using voice recognition software. While this document  has been carefully reviewed, transcription errors may be present. I apologize in advance for any inconvenience. Please contact me if further clarification is needed.            Renda Beckwith, Psy.D.             Neuropsychologist

## 2023-10-14 NOTE — Progress Notes (Signed)
   Psychometrician Note   Cognitive testing was administered to Courtney Castro by Lonell Jude, B.S. (psychometrist) under the supervision of Dr. Renda Beckwith, Psy.D., licensed psychologist on 10/14/2023. Courtney Castro did not appear overtly distressed by the testing session per behavioral observation or responses across self-report questionnaires. Rest breaks were offered.    The battery of tests administered was selected by Dr. Renda Beckwith, Psy.D. with consideration to Courtney Castro's current level of functioning, the nature of her symptoms, emotional and behavioral responses during interview, level of literacy, observed level of motivation/effort, and the nature of the referral question. This battery was communicated to the psychometrist. Communication between Dr. Renda Beckwith, Psy.D. and the psychometrist was ongoing throughout the evaluation and Dr. Renda Beckwith, Psy.D. was immediately accessible at all times. Dr. Renda Beckwith, Psy.D. provided supervision to the psychometrist on the date of this service to the extent necessary to assure the quality of all services provided.    Courtney Castro will return within approximately 1-2 weeks for an interactive feedback session with Dr. Beckwith at which time her test performances, clinical impressions, and treatment recommendations will be reviewed in detail. Courtney Castro understands she can contact our office should she require our assistance before this time.  A total of 120 minutes of billable time were spent face-to-face with Courtney Castro by the psychometrist. This includes both test administration and scoring time. Billing for these services is reflected in the clinical report generated by Dr. Renda Beckwith, Psy.D.  This note reflects time spent with the psychometrician and does not include test scores or any clinical interpretations made by Dr. Beckwith. The full report will follow in a separate note.

## 2023-10-15 ENCOUNTER — Inpatient Hospital Stay
Admission: RE | Admit: 2023-10-15 | Discharge: 2023-10-15 | Discharge status: Home / Self Care | Source: Ambulatory Visit | Attending: Physician Assistant | Admitting: Physician Assistant

## 2023-10-15 DIAGNOSIS — D320 Benign neoplasm of cerebral meninges: Secondary | ICD-10-CM | POA: Diagnosis not present

## 2023-10-15 DIAGNOSIS — G9389 Other specified disorders of brain: Secondary | ICD-10-CM | POA: Diagnosis not present

## 2023-10-17 ENCOUNTER — Ambulatory Visit: Payer: Self-pay | Admitting: Physician Assistant

## 2023-10-21 ENCOUNTER — Ambulatory Visit: Payer: Self-pay | Admitting: Psychology

## 2023-10-21 DIAGNOSIS — R4189 Other symptoms and signs involving cognitive functions and awareness: Secondary | ICD-10-CM

## 2023-10-21 NOTE — Progress Notes (Signed)
   NEUROPSYCHOLOGY FEEDBACK SESSION Courtney Castro  [facility]  Date of Feedback Session: 10/21/2023  REASON FOR REFERRAL   Courtney Castro is an 89 year old, right-handed, White female with 16 years of formal education. She was referred for neuropsychological evaluation by [name], PA-C, to assess current neurocognitive functioning, document potential cognitive deficits, and assist with treatment planning. This is her first neuropsychological evaluation.  FEEDBACK   Patient completed a comprehensive neuropsychological evaluation on 10/14/2023. Please refer to that encounter for the full report and recommendations. Briefly, results indicated broadly normal test performance, with the exception of a few isolated weaknesses that did not appear to reflect broader impairments within their respective domains. Functional independence is maintained. There is no evidence of a neurocognitive disorder at this time. Etiology of the confrontation naming difficulty is somewhat unclear at this time, especially given that other language abilities are well-preserved; we can monitor it over time to determine whether it represents an emerging pattern. Otherwise, subjective cognitive concerns are most likely related to normal aging, anxiety, and cerebrovascular changes.   Today, the patient was accompanied by her daughter. They were provided verbal feedback regarding the findings and impression during this visit, and their questions were answered. A copy of the report was provided at the conclusion of the visit.  DISPOSITION   Patient will follow up with the referring provider, [name]. No follow-up neuropsychological testing was scheduled at this time. Please feel free to refer the patient for repeated evaluation if she shows a significant change in neurocognitive status.  SERVICE   This feedback session was conducted by Renda Beckwith, Psy.D. [place] unit of 03867 (35  minutes) was billed for Dr. Beckwith' time spent in preparing, conducting, and documenting the current feedback session.  This report was generated using voice recognition software. While this document has been carefully reviewed, transcription errors may be present. I apologize in advance for any inconvenience. Please contact me if further clarification is needed.

## 2023-10-25 DIAGNOSIS — M47816 Spondylosis without myelopathy or radiculopathy, lumbar region: Secondary | ICD-10-CM | POA: Diagnosis not present

## 2023-10-25 DIAGNOSIS — M1712 Unilateral primary osteoarthritis, left knee: Secondary | ICD-10-CM | POA: Diagnosis not present

## 2023-10-25 DIAGNOSIS — M1711 Unilateral primary osteoarthritis, right knee: Secondary | ICD-10-CM | POA: Diagnosis not present

## 2023-10-27 ENCOUNTER — Other Ambulatory Visit: Payer: Self-pay | Admitting: Family Medicine

## 2023-10-27 DIAGNOSIS — E039 Hypothyroidism, unspecified: Secondary | ICD-10-CM

## 2023-11-21 ENCOUNTER — Other Ambulatory Visit: Payer: Self-pay | Admitting: Family Medicine

## 2023-11-21 DIAGNOSIS — E782 Mixed hyperlipidemia: Secondary | ICD-10-CM

## 2023-12-05 ENCOUNTER — Ambulatory Visit

## 2023-12-05 ENCOUNTER — Ambulatory Visit: Admitting: Physician Assistant

## 2023-12-11 DIAGNOSIS — D225 Melanocytic nevi of trunk: Secondary | ICD-10-CM | POA: Diagnosis not present

## 2023-12-11 DIAGNOSIS — D692 Other nonthrombocytopenic purpura: Secondary | ICD-10-CM | POA: Diagnosis not present

## 2023-12-11 DIAGNOSIS — L814 Other melanin hyperpigmentation: Secondary | ICD-10-CM | POA: Diagnosis not present

## 2023-12-11 DIAGNOSIS — Z85828 Personal history of other malignant neoplasm of skin: Secondary | ICD-10-CM | POA: Diagnosis not present

## 2023-12-11 DIAGNOSIS — L821 Other seborrheic keratosis: Secondary | ICD-10-CM | POA: Diagnosis not present

## 2023-12-11 DIAGNOSIS — D1801 Hemangioma of skin and subcutaneous tissue: Secondary | ICD-10-CM | POA: Diagnosis not present

## 2023-12-11 DIAGNOSIS — L853 Xerosis cutis: Secondary | ICD-10-CM | POA: Diagnosis not present

## 2023-12-13 ENCOUNTER — Other Ambulatory Visit: Payer: Self-pay | Admitting: Family Medicine

## 2023-12-13 DIAGNOSIS — I10 Essential (primary) hypertension: Secondary | ICD-10-CM

## 2023-12-13 NOTE — Telephone Encounter (Signed)
 Has an appt mid October

## 2024-01-07 NOTE — Progress Notes (Unsigned)
 No chief complaint on file.  She was last seen in June, accompanied by her daughter [name], with memory concerns. She also had cerumen impaction at that time, and has since seen ENT and had cerumen removed (after we were unable to completely remove cerumen with attempts on 2 different days).  Her daughter reported more short-term memory issues.  Stories/conversations are being repeated, sometimes within the same conversation.  Asks things a few minutes later. Some confusion when talking--struggles to clarify something, gets rattled before  Patient denied any significant changes.  She still pays all her bills, never loses anything or gets lost.  They requested neuro referral (after dealing with dementia in their dad also, pt's husband).  She saw neuro 7/1, and was referred for neurocognitive testing. She had this done, and they discussed results at f/u visit-- results indicated broadly normal test performance, with the exception of a few isolated weaknesses that did not appear to reflect broader impairments within their respective domains. Functional independence is maintained. There is no evidence of a neurocognitive disorder at this time. Etiology of the confrontation naming difficulty is somewhat unclear at this time, especially given that other language abilities are well-preserved; we can monitor it over time to determine whether it represents an emerging pattern. Otherwise, subjective cognitive concerns are most likely related to normal aging, anxiety, and cerebrovascular changes.   MRI 09/2023: IMPRESSION: 1.  No evidence of an acute intracranial abnormality. 2. No age-advanced or lobar predominant cerebral atrophy. 3. Known 8 mm meningioma along the right aspect of the anterior falx. 4. 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 5. Background mild cerebral white matter chronic small vessel ischemic  disease.   Hypertension.  Compliant with losartan -HCT 100-25 and atenolol  25mg , denies side effects. She denies HA, dizziness, chest pain, palpitations, muscle cramps, edema or shortness of breath. BP's aren't checked elsewhere.   BP Readings from Last 3 Encounters:  09/24/23 (!) 145/75  09/04/23 112/70  08/28/23 120/70     Hyperlipidemia follow-up:  Patient is reportedly following a low-fat, low cholesterol diet. Compliant with medications (simvastatin  20mg ) and denies medication side effects. She denies any changes to her diet, mainly eats a lot of chicken and fish. Beef just a couple of times/month, hardly ever has pork. She also had aortic atherosclerosis noted on CT 01/2021. Lipids were at goal on last check: Lab Results  Component Value Date   CHOL 158 07/03/2023   HDL 73 07/03/2023   LDLCALC 68 07/03/2023   TRIG 94 07/03/2023   CHOLHDL 2.2 07/03/2023       Hypothyroidism--adequately replaced per last check. She reports compliance with taking medication on empty stomach. She denies changes to hair/skin/nail, energy/weight/moods. No change to bowels, has IBS, and taking Align helps, along with daily fiber.   Lab Results  Component Value Date   TSH 1.320 07/03/2023     Allergies: year-round, controlled with flonase  and allegra daily.  She has some PND, clear. Denies any sneezing. Blows her nose just in the mornings. Denies any cough. Occasionally uses eye drops (2-3x/month).   Exudative macular degeneration--sees Dr. Greven regularly. Previously had injections into L eye, none in over 3 years. She hasn't noticed significant change in vision (slight change in prescription of her glasses). Not driving much at night.   She continues to get PT through [facility] ([facility])  to help with low back/hip pain and balance. She reports she is doing great.  E-stim for her R  hip pain helps.     Grief--she continues to see Rhoda Rana for counseling every 2 weeks. She helps a  lot. She still hasn't gotten back to church, but hopes to at some point. She plays canasta and bridge with her old neighborhood friends.  She goes to the [employer] once a month at [employer]. She has lunch out with friends.   H/o impaired fasting glucose: She tries to limit her sweets and carbs, has small portions. I love ice cream--2 teaspoons at a time.  Lab Results  Component Value Date   HGBA1C 5.8 (H) 07/03/2023     PMH, PSH, SH reviewed   ROS:  No fever, chills, URI symptoms, chest pain, headaches, dizziness, syncope.  No nausea, vomiting, heartburn, urinary complaints (just frequency).  No edema.  Denies abdominal pain, blood in the stool. No thyroid  complaints  Only occasionally has hip discomfort, no longer has back pain. Denies bleeding, bruising, rash. Moods are good. Allergies are well controlled.    PHYSICAL EXAM:  There were no vitals taken for this visit.  Wt Readings from Last 3 Encounters:  09/24/23 138 lb (62.6 kg)  09/04/23 138 lb 3.2 oz (62.7 kg)  08/29/23 138 lb (62.6 kg)   Well developed, pleasant female in no distress HEENT:  PERRL, EOMI, conjunctiva clear. OP clear, sinuses nontender. Neck: no lymphadenopathy, thyromegaly or carotid bruit. Heart: regular rate and rhythm, no murmur Lungs: clear bilaterally Abdomen: soft, nontender, no organomegaly or mass Back: no CVA or spinal tenderness.  Extremities: No edema, 2+ pulses Skin: normal turgor, no rashes, lesions.   Psych: normal mood, affect, hygiene and grooming. Neuro: alert and oriented. Normal strength, gait.   ***UPDATE--cerumen?  ASSESSMENT/PLAN:   A1c can be done just yearly, unless change in sugar intake or significant weight gain TSH only if symptoms. Otherwise, no labs needed today.

## 2024-01-08 ENCOUNTER — Ambulatory Visit (INDEPENDENT_AMBULATORY_CARE_PROVIDER_SITE_OTHER): Admitting: Family Medicine

## 2024-01-08 ENCOUNTER — Encounter: Payer: Self-pay | Admitting: Family Medicine

## 2024-01-08 VITALS — BP 130/70 | HR 68 | Ht 64.0 in | Wt 138.8 lb

## 2024-01-08 DIAGNOSIS — E782 Mixed hyperlipidemia: Secondary | ICD-10-CM | POA: Diagnosis not present

## 2024-01-08 DIAGNOSIS — Z23 Encounter for immunization: Secondary | ICD-10-CM

## 2024-01-08 DIAGNOSIS — I10 Essential (primary) hypertension: Secondary | ICD-10-CM

## 2024-01-08 DIAGNOSIS — F419 Anxiety disorder, unspecified: Secondary | ICD-10-CM | POA: Diagnosis not present

## 2024-01-08 DIAGNOSIS — E039 Hypothyroidism, unspecified: Secondary | ICD-10-CM

## 2024-01-08 MED ORDER — LOSARTAN POTASSIUM-HCTZ 100-25 MG PO TABS: 1.0000 | ORAL_TABLET | Freq: Every day | ORAL | 1 refills | Status: AC

## 2024-01-08 MED ORDER — LEVOTHYROXINE SODIUM 25 MCG PO TABS: 25.0000 ug | ORAL_TABLET | Freq: Every day | ORAL | 1 refills | Status: DC

## 2024-01-12 ENCOUNTER — Other Ambulatory Visit: Payer: Self-pay | Admitting: Family Medicine

## 2024-01-12 DIAGNOSIS — I10 Essential (primary) hypertension: Secondary | ICD-10-CM

## 2024-01-30 ENCOUNTER — Other Ambulatory Visit: Payer: Self-pay | Admitting: Family Medicine

## 2024-02-17 ENCOUNTER — Other Ambulatory Visit: Payer: Self-pay | Admitting: Family Medicine

## 2024-02-17 DIAGNOSIS — E782 Mixed hyperlipidemia: Secondary | ICD-10-CM

## 2024-03-17 NOTE — Progress Notes (Signed)
 "   Cognitive changes   Courtney Castro is a delightful  89 y.o. RH female with a history of hypertension, hyperlipidemia, hypothyroidism, known subcentimeter right parafalcine meningioma without mass effect per CT of the head in without growth since 2022, anxiety presenting today in follow-up for evaluation of memory concerns.  As recalled, she had a neuropsych evaluation on 10/14/2023, yielding no evidence of neurocognitive disorder at this time.  In view of these findings, there is no indication for antidementia medication at this moment, unless worsening of memory occurs.  She was last seen on 09/24/2023 with [facility] 27/30.  Patient is able to participate on ADLs and to drive without difficulties. Mood is anxious, she attends psychotherapy sessions which are beneficial. This patient is accompanied in the office by her daughter who supplements the history. Previous records as well as any outside records available were reviewed prior to todays visit   Continue to control mood as per PCP Continue psychotherapy sessions for anxiety Recommend good control of cardiovascular risk factors No  antidementia medication is indicated at this time unless memory worsens Follow up in July 2026 .      Discussed the use of AI scribe software for clinical note transcription with the patient, who gave verbal consent to proceed.  History of Present Illness Courtney Castro is an 89 year old female who presents for a follow-up on memory concerns and cognitive evaluation.  She has undergone extensive cognitive testing. Since July, she has experienced slight changes in her short-term memory, such as occasional difficulty remembering names of streets or places. She engages in mental activities such as crossword puzzles and word searches. She experiences anxiety, particularly related to appointments and weather, and sees a therapist every two weeks, which she finds beneficial. She recently had an emotional response to the  anniversary of her husband's passing, which she discussed with her therapist. No hallucinations, paranoia, or seizures.  Her sleep is interrupted by nocturia, likely due to her intake of 60 to 70 ounces of water daily. Despite this, she is able to return to sleep and sometimes sleeps for another three hours. She typically stops drinking water by 8 PM.  She maintains a balanced diet with three meals a day, focusing on healthy foods due to her history of irritable bowel syndrome, for which she takes Libr[employer], [employer]. She participates in balance group exercises but does not walk much. She has not experienced any recent falls or head injuries.  She manages her daily activities independently, including dressing, showering, and managing her medications and finances.  Her vision is due for a checkup, as she has noticed some difficulty with reading small print, although her driving is unaffected. She uses a magnifying glass for reading small letters.  Her hearing is good.  Initial visit 09/24/2023   How long did patient have memory difficulties?  For about one year, per daughter's report. Patient reports some difficulty remembering new information, recent conversations, names. LTM may be slightly affected as well, for example during a wedding she could not recognize a family member.  repeats oneself?  Endorsed, at times within the same conversation. A good amount of looping-daughter says.  Disoriented when walking into a room? Denies    Leaving objects in unusual places?  Denies.   Wandering behavior? Denies.   Any personality changes, or depression, anxiety? I am sure I am anxious about things, especially appointments, the weather . She sees a therapist Rhoda, every 2 weeks Hallucinations or paranoia? Denies.   Seizures? Denies.  Any sleep changes?   Does not sleep well because I have to get up to go to the bathroom every 2 hours. Denies  frequent nightmares or dream reenactment, other REM behavior or  sleepwalking   Sleep apnea? Denies.   Any hygiene concerns?  Denies.   Independent of bathing and dressing? Endorsed  Does the patient need help with medications?  Patient is in charge   Who is in charge of the finances?  Patient is in charge, denies missing any bills     Any changes in appetite?   Denies.     Patient have trouble swallowing?  Denies.   Does the patient cook? Some, denies forgetting common recipes or kitchen accidents   Any headaches?  Denies.   Chronic pain? Denies.   Ambulates with difficulty? Denies. Does balance group exercise. Does not walk much. Recent falls or head injuries? Denies.     Vision changes?  Denies any new issues.  Had cataracts removed  Hearing issues?  Wax was impacted on my R ear, it was removed . Any strokelike symptoms? Denies.   Any tremors? Denies.   Any anosmia? Denies.   Any incontinence of urine? At night time.  Any bowel dysfunction? Denies. Years ago I had IBS, but controlled with align and no recurrence.   Patient lives alone. Her husband died with Lewy body disease   History of heavy alcohol intake? Denies.   History of heavy tobacco use? Denies.   Family history of dementia?  Denies.  Does patient drive? yes, denies getting lost.   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 Gayland Briefly, results indicated broadly normal test performance, with the exception of a few isolated weaknesses that did not appear to reflect broader impairments within their respective domains. Functional independence is maintained. There is no evidence of a neurocognitive disorder at this time. Etiology of the confrontation naming difficulty is somewhat unclear at this time, especially given that other language abilities are well-preserved; we can monitor it over time to determine whether it represents an emerging pattern. Otherwise, subjective cognitive concerns are most likely related to normal aging,  anxiety, and cerebrovascular changes.   MRI of the brain 10/17/2023, personally reviewed, without evidence of acute intracranial abnormality, no age advanced or lobar predominant cerebral atrophy, known 8 mm meningioma along the right aspect of the anterior falx and change from prior imaging, redemonstrated small focus of chronic posttraumatic encephalomalacia-gliosis within the anterior medial left frontal lobe, mild small vessel ischemic disease.  Past Medical History:  Diagnosis Date   Allergy    seasonal allergies   Basal cell carcinoma 2007, 2015, 2017   face and shoulder (Dr. Arlen)   2017-chin; 08/2021 L tibia, R shoulder   BCC (basal cell carcinoma), leg, right 01/28/2018   right thigh-Dr Stinehelfer   Breast cancer [facility]) 1989   R breast-microinvasive papillary(lumpectomy and XRT)   Colon polyp 09/2000   Diverticulosis    seen on colonoscopy   Elevated liver function tests 06/2009   galllstones and dilated CBD--s/p ERCP and chole   Family history of breast cancer    Genetic testing 12/20/2016   Multi-Cancer panel (83 genes) @ [facility] - CHEK2 mutation   Gilbert syndrome    Hyperlipidemia    Hypertension    Hyperthyroidism    IBS (irritable bowel syndrome) 1984   Macular degeneration 2008   mild   Radial head fracture 02/2014   left, nondisplaced  Subarachnoid hemorrhage (HCC) 02/2014   after fall   Subdural hematoma (HCC) 06/2008   Dr.Cram   Subdural hematoma (HCC) 02/2014   after fall (Dr. Unice)     Past Surgical History:  Procedure Laterality Date   ABDOMINAL HYSTERECTOMY     bladder repair   BREAST LUMPECTOMY Right 1989   right breast   CATARACT EXTRACTION, BILATERAL Bilateral June and August 2020   Dr. Rosan   ERCP  07/2009   sphincterotomy   fracture left foot  01/2007   5th metatarsal-treated with boot   HAMMER TOE SURGERY Right 05/2004   right foot   LAPAROSCOPIC CHOLECYSTECTOMY  07/2009   TOTAL KNEE ARTHROPLASTY Right 2000   TOTAL KNEE  ARTHROPLASTY Left 2004   Dr. Melodi   TUBAL LIGATION           Objective:     PHYSICAL EXAMINATION:    VITALS:  There were no vitals filed for this visit.  GEN:  The patient appears stated age and is in NAD. HEENT:  Normocephalic, atraumatic.   Neurological examination:  General: NAD, well-groomed, appears stated age. Orientation: The patient is alert. Oriented to person, place and   to date.  Cranial nerves: There is good facial symmetry.The speech is fluent and clear. No aphasia or dysarthria. Fund of knowledge is appropriate. Recent memory impaired and remote memory is normal.  Attention and concentration are normal.  Able to name objects and repeat phrases.  Hearing is intact to conversational tone.     Sensation: Sensation is intact to light touch throughout Motor: Strength is at least antigravity x4. DTR's 2/4 in UE/LE      09/24/2023   11:00 AM  Montreal Cognitive Assessment   Visuospatial/ Executive (0/5) 5  Naming (0/3) 3  Attention: Read list of digits (0/2) 2  Attention: Read list of letters (0/1) 1  Attention: Serial 7 subtraction starting at 100 (0/3) 3  Language: Repeat phrase (0/2) 2  Language : Fluency (0/1) 1  Abstraction (0/2) 2  Delayed Recall (0/5) 2  Orientation (0/6) 6  Total 27  Adjusted Score (based on education) 27       08/28/2023    2:39 PM  MMSE - Mini Mental State Exam  Orientation to time 5  Orientation to Place 5  Registration 3  Attention/ Calculation 5  Recall 2  Language- name 2 objects 2  Language- repeat 1  Language- follow 3 step command 3  Language- read & follow direction 1  Write a sentence 1  Copy design 1  Total score 29      Movement examination: Tone: There is normal tone in the UE/LE Abnormal movements:  no tremor.  No myoclonus.  No asterixis.   Coordination:  There is no decremation with RAM's. Normal finger to nose  Gait and Station: The patient has no difficulty arising out of a deep-seated chair without  the use of the hands. The patient's stride length is good.  Gait is cautious and narrow.   Thank you for allowing us  the opportunity to participate in the care of this nice patient. Please do not hesitate to contact us  for any questions or concerns.   Total time spent on today's visit was 35 minutes dedicated to this patient today, preparing to see patient, examining the patient, ordering tests and/or medications and counseling the patient, documenting clinical information in the EHR or other health record, independently interpreting results and communicating results to the patient/family, discussing treatment and goals, answering  patient's questions and coordinating care.  Cc:  Randol Dawes, MD  [name] 03/17/2024 7:29 AM      "

## 2024-03-20 ENCOUNTER — Ambulatory Visit: Payer: Self-pay | Admitting: Physician Assistant

## 2024-03-20 ENCOUNTER — Encounter: Payer: Self-pay | Admitting: Physician Assistant

## 2024-03-20 VITALS — BP 135/76 | HR 72 | Resp 20 | Wt 138.0 lb

## 2024-03-20 DIAGNOSIS — R4189 Other symptoms and signs involving cognitive functions and awareness: Secondary | ICD-10-CM

## 2024-03-20 NOTE — Patient Instructions (Addendum)
 It was a pleasure to see you today at our office.   Recommendations:   July 17 at 11:30 follow up    [email or web address]   RECOMMENDATIONS FOR ALL PATIENTS WITH MEMORY PROBLEMS: 1. Continue to exercise (Recommend 30 minutes of walking everyday, or 3 hours every week) 2. Increase social interactions - continue going to [place] and enjoy social gatherings with friends and family 3. Eat healthy, avoid fried foods and eat more fruits and vegetables 4. Maintain adequate blood pressure, blood sugar, and blood cholesterol level. Reducing the risk of stroke and cardiovascular disease also helps promoting better memory. 5. Avoid stressful situations. Live a simple life and avoid aggravations. Organize your time and prepare for the next day in anticipation. 6. Sleep well, avoid any interruptions of sleep and avoid any distractions in the bedroom that may interfere with adequate sleep quality 7. Avoid sugar, avoid sweets as there is a strong link between excessive sugar intake, diabetes, and cognitive impairment We discussed the Mediterranean diet, which has been shown to help patients reduce the risk of progressive memory disorders and reduces cardiovascular risk. This includes eating fish, eat fruits and green leafy vegetables, nuts like almonds and hazelnuts, walnuts, and also use olive oil. Avoid fast foods and fried foods as much as possible. Avoid sweets and sugar as sugar use has been linked to worsening of memory function.  There is always a concern of gradual progression of memory problems. If this is the case, then we may need to adjust level of care according to patient needs. Support, both to the patient and caregiver, should then be put into place.      You have been referred for a neuropsychological evaluation (i.e., evaluation of memory and thinking abilities). Please bring someone with you to this appointment if possible, as it is  helpful for the doctor to hear from both you and another adult who knows you well. Please bring eyeglasses and hearing aids if you wear them.    The evaluation will take approximately 3 hours and has two parts:   The first part is a clinical interview with the neuropsychologist (Dr. Richie or Dr. Gayland). During the interview, the neuropsychologist will speak with you and the individual you brought to the appointment.    The second part of the evaluation is testing with the doctor's technician Neal or Luke). During the testing, the technician will ask you to remember different types of material, solve problems, and answer some questionnaires. Your family member will not be present for this portion of the evaluation.   Please note: We must reserve several hours of the neuropsychologist's time and the psychometrician's time for your evaluation appointment. As such, there is a No-Show fee of $100. If you are unable to attend any of your appointments, please contact our office as soon as possible to reschedule.      DRIVING: Regarding driving, in patients with progressive memory problems, driving will be impaired. We advise to have someone else do the driving if trouble finding directions or if minor accidents are reported. Independent driving assessment is available to determine safety of driving.   If you are interested in the driving assessment, you can contact the following:  [facility] in [place] [phone]  [facility] [phone]  [facility] [phone]  [facility] [phone] or [phone]   FALL PRECAUTIONS: Be cautious when walking. Scan the area for obstacles that may increase the risk of trips and falls. When getting up in the  mornings, sit up at the edge of the bed for a few minutes before getting out of bed. Consider elevating the bed at the head end to avoid drop of blood pressure when getting up. Walk always in a  well-lit room (use night lights in the walls). Avoid area rugs or power cords from appliances in the middle of the walkways. Use a walker or a cane if necessary and consider physical therapy for balance exercise. Get your eyesight checked regularly.  FINANCIAL OVERSIGHT: Supervision, especially oversight when making financial decisions or transactions is also recommended.  HOME SAFETY: Consider the safety of the kitchen when operating appliances like stoves, microwave oven, and blender. Consider having supervision and share cooking responsibilities until no longer able to participate in those. Accidents with firearms and other hazards in the house should be identified and addressed as well.   ABILITY TO BE LEFT ALONE: If patient is unable to contact 911 operator, consider using LifeLine, or when the need is there, arrange for someone to stay with patients. Smoking is a fire hazard, consider supervision or cessation. Risk of wandering should be assessed by caregiver and if detected at any point, supervision and safe proof recommendations should be instituted.  MEDICATION SUPERVISION: Inability to self-administer medication needs to be constantly addressed. Implement a mechanism to ensure safe administration of the medications.      Mediterranean Diet A Mediterranean diet refers to food and lifestyle choices that are based on the traditions of countries located on the [employer] [place]. This way of eating has been shown to help prevent certain conditions and improve outcomes for people who have chronic diseases, like kidney disease and heart disease. What are tips for following this plan? Lifestyle  Cook and eat meals together with your family, when possible. Drink enough fluid to keep your urine clear or pale yellow. Be physically active every day. This includes: Aerobic exercise like running or swimming. Leisure activities like gardening, walking, or housework. Get 7-8 hours of sleep each  night. If recommended by your health care provider, drink red wine in moderation. This means 1 glass a day for nonpregnant women and 2 glasses a day for men. A glass of wine equals 5 oz (150 mL). Reading food labels  Check the serving size of packaged foods. For foods such as rice and pasta, the serving size refers to the amount of cooked product, not dry. Check the total fat in packaged foods. Avoid foods that have saturated fat or trans fats. Check the ingredients list for added sugars, such as corn syrup. Shopping  At the grocery store, buy most of your food from the areas near the walls of the store. This includes: Fresh fruits and vegetables (produce). Grains, beans, nuts, and seeds. Some of these may be available in unpackaged forms or large amounts (in bulk). Fresh seafood. Poultry and eggs. Low-fat dairy products. Buy whole ingredients instead of prepackaged foods. Buy fresh fruits and vegetables in-season from local farmers markets. Buy frozen fruits and vegetables in resealable bags. If you do not have access to quality fresh seafood, buy precooked frozen shrimp or canned fish, such as tuna, salmon, or sardines. Buy small amounts of raw or cooked vegetables, salads, or olives from the deli or salad bar at your store. Stock your pantry so you always have certain foods on hand, such as olive oil, canned tuna, canned tomatoes, rice, pasta, and beans. Cooking  Cook foods with extra-virgin olive oil instead of using butter or other vegetable oils. Have  meat as a side dish, and have vegetables or grains as your main dish. This means having meat in small portions or adding small amounts of meat to foods like pasta or stew. Use beans or vegetables instead of meat in common dishes like chili or lasagna. Experiment with different cooking methods. Try roasting or broiling vegetables instead of steaming or sauteing them. Add frozen vegetables to soups, stews, pasta, or rice. Add nuts or seeds  for added healthy fat at each meal. You can add these to yogurt, salads, or vegetable dishes. Marinate fish or vegetables using olive oil, lemon juice, garlic, and fresh herbs. Meal planning  Plan to eat 1 vegetarian meal one day each week. Try to work up to 2 vegetarian meals, if possible. Eat seafood 2 or more times a week. Have healthy snacks readily available, such as: Vegetable sticks with hummus. Greek yogurt. Fruit and nut trail mix. Eat balanced meals throughout the week. This includes: Fruit: 2-3 servings a day Vegetables: 4-5 servings a day Low-fat dairy: 2 servings a day Fish, poultry, or lean meat: 1 serving a day Beans and legumes: 2 or more servings a week Nuts and seeds: 1-2 servings a day Whole grains: 6-8 servings a day Extra-virgin olive oil: 3-4 servings a day Limit red meat and sweets to only a few servings a month What are my food choices? Mediterranean diet Recommended Grains: Whole-grain pasta. Brown rice. Bulgar wheat. Polenta. Couscous. Whole-wheat bread. [place] Made[place]. Vegetables: Artichokes. Beets. Broccoli. Cabbage. Carrots. Eggplant. Green beans. Chard. Kale. Spinach. Onions. Leeks. Peas. Squash. Tomatoes. Peppers. Radishes. Fruits: Apples. Apricots. Avocado. Berries. Bananas. Cherries. Dates. Figs. Grapes. Lemons. Melon. Oranges. Peaches. Plums. Pomegranate. Meats and other protein foods: Beans. Almonds. Sunflower seeds. Pine nuts. Peanuts. Cod. Salmon. Scallops. Shrimp. Tuna. Tilapia. Clams. Oysters. Eggs. Dairy: Low-fat milk. Cheese. Greek yogurt. Beverages: Water. Red wine. Herbal tea. Fats and oils: Extra virgin olive oil. Avocado oil. Grape seed oil. Sweets and desserts: Greek yogurt with honey. Baked apples. Poached pears. Trail mix. Seasoning and other foods: Basil. Cilantro. Coriander. Cumin. Mint. Parsley. Sage. Rosemary. Tarragon. Garlic. Oregano. Thyme. Pepper. Balsalmic vinegar. Tahini. Hummus. Tomato sauce. Olives. Mushrooms. Limit  these Grains: Prepackaged pasta or rice dishes. Prepackaged cereal with added sugar. Vegetables: Deep fried potatoes (french fries). Fruits: Fruit canned in syrup. Meats and other protein foods: Beef. Pork. Lamb. Poultry with skin. Hot dogs. [place]. Dairy: Ice cream. Sour cream. Whole milk. Beverages: Juice. Sugar-sweetened soft drinks. Beer. Liquor and spirits. Fats and oils: Butter. Canola oil. Vegetable oil. Beef fat (tallow). Lard. Sweets and desserts: Cookies. Cakes. Pies. Candy. Seasoning and other foods: Mayonnaise. Premade sauces and marinades. The items listed may not be a complete list. Talk with your dietitian about what dietary choices are right for you. Summary The Mediterranean diet includes both food and lifestyle choices. Eat a variety of fresh fruits and vegetables, beans, nuts, seeds, and whole grains. Limit the amount of red meat and sweets that you eat. Talk with your health care provider about whether it is safe for you to drink red wine in moderation. This means 1 glass a day for nonpregnant women and 2 glasses a day for men. A glass of wine equals 5 oz (150 mL). This information is not intended to replace advice given to you by your health care provider. Make sure you discuss any questions you have with your health care provider. Document Released: 11/03/2015 Document Revised: 12/06/2015 Document Reviewed: 11/03/2015 Elsevier Interactive Patient Education  2017 [employer].

## 2024-04-10 ENCOUNTER — Other Ambulatory Visit: Payer: Self-pay

## 2024-04-10 DIAGNOSIS — E039 Hypothyroidism, unspecified: Secondary | ICD-10-CM

## 2024-04-10 MED ORDER — LEVOTHYROXINE SODIUM 25 MCG PO TABS: 25.0000 ug | ORAL_TABLET | Freq: Every day | ORAL | 1 refills | Status: AC

## 2024-07-29 ENCOUNTER — Encounter: Payer: Self-pay | Admitting: Family Medicine

## 2024-10-09 ENCOUNTER — Ambulatory Visit: Payer: Self-pay | Admitting: Physician Assistant
